# Patient Record
Sex: Female | Born: 1935 | Race: Black or African American | Hispanic: No | Smoking: Never smoker
Health system: Southern US, Community
[De-identification: ages and names within clinical notes are randomized; demographics above are authoritative.]

## PROBLEM LIST (undated history)

## (undated) DIAGNOSIS — Z66 Do not resuscitate: Secondary | ICD-10-CM

## (undated) DIAGNOSIS — IMO0002 Reserved for concepts with insufficient information to code with codable children: Secondary | ICD-10-CM

## (undated) DIAGNOSIS — E039 Hypothyroidism, unspecified: Secondary | ICD-10-CM

## (undated) DIAGNOSIS — F0390 Unspecified dementia without behavioral disturbance: Secondary | ICD-10-CM

## (undated) DIAGNOSIS — D472 Monoclonal gammopathy: Secondary | ICD-10-CM

## (undated) DIAGNOSIS — K219 Gastro-esophageal reflux disease without esophagitis: Secondary | ICD-10-CM

## (undated) DIAGNOSIS — K5792 Diverticulitis of intestine, part unspecified, without perforation or abscess without bleeding: Secondary | ICD-10-CM

## (undated) DIAGNOSIS — A0472 Enterocolitis due to Clostridium difficile, not specified as recurrent: Secondary | ICD-10-CM

## (undated) DIAGNOSIS — G9340 Encephalopathy, unspecified: Secondary | ICD-10-CM

## (undated) DIAGNOSIS — K8021 Calculus of gallbladder without cholecystitis with obstruction: Secondary | ICD-10-CM

## (undated) DIAGNOSIS — A021 Salmonella sepsis: Secondary | ICD-10-CM

## (undated) DIAGNOSIS — I10 Essential (primary) hypertension: Secondary | ICD-10-CM

## (undated) DIAGNOSIS — M4800 Spinal stenosis, site unspecified: Secondary | ICD-10-CM

## (undated) DIAGNOSIS — K279 Peptic ulcer, site unspecified, unspecified as acute or chronic, without hemorrhage or perforation: Secondary | ICD-10-CM

## (undated) DIAGNOSIS — K8309 Other cholangitis: Secondary | ICD-10-CM

## (undated) DIAGNOSIS — E46 Unspecified protein-calorie malnutrition: Secondary | ICD-10-CM

## (undated) DIAGNOSIS — J449 Chronic obstructive pulmonary disease, unspecified: Secondary | ICD-10-CM

## (undated) DIAGNOSIS — K5730 Diverticulosis of large intestine without perforation or abscess without bleeding: Secondary | ICD-10-CM

## (undated) DIAGNOSIS — K2970 Gastritis, unspecified, without bleeding: Secondary | ICD-10-CM

## (undated) DIAGNOSIS — R339 Retention of urine, unspecified: Secondary | ICD-10-CM

## (undated) DIAGNOSIS — K859 Acute pancreatitis without necrosis or infection, unspecified: Secondary | ICD-10-CM

## (undated) DIAGNOSIS — I639 Cerebral infarction, unspecified: Secondary | ICD-10-CM

## (undated) DIAGNOSIS — Z9889 Other specified postprocedural states: Secondary | ICD-10-CM

## (undated) DIAGNOSIS — M1990 Unspecified osteoarthritis, unspecified site: Secondary | ICD-10-CM

## (undated) DIAGNOSIS — K449 Diaphragmatic hernia without obstruction or gangrene: Secondary | ICD-10-CM

## (undated) DIAGNOSIS — K2210 Ulcer of esophagus without bleeding: Secondary | ICD-10-CM

## (undated) DIAGNOSIS — R569 Unspecified convulsions: Secondary | ICD-10-CM

## (undated) DIAGNOSIS — E119 Type 2 diabetes mellitus without complications: Secondary | ICD-10-CM

## (undated) DIAGNOSIS — N289 Disorder of kidney and ureter, unspecified: Secondary | ICD-10-CM

## (undated) HISTORY — PX: CERVICAL FUSION: SHX112

## (undated) HISTORY — DX: Essential (primary) hypertension: I10

## (undated) HISTORY — PX: CHOLECYSTECTOMY: SHX55

## (undated) HISTORY — DX: Chronic obstructive pulmonary disease, unspecified: J44.9

## (undated) HISTORY — DX: Retention of urine, unspecified: R33.9

## (undated) HISTORY — DX: Diaphragmatic hernia without obstruction or gangrene: K44.9

## (undated) HISTORY — DX: Acute pancreatitis without necrosis or infection, unspecified: K85.90

## (undated) HISTORY — DX: Other specified postprocedural states: Z98.890

## (undated) HISTORY — DX: Spinal stenosis, site unspecified: M48.00

## (undated) HISTORY — DX: Gastritis, unspecified, without bleeding: K29.70

## (undated) HISTORY — DX: Diverticulosis of large intestine without perforation or abscess without bleeding: K57.30

## (undated) HISTORY — DX: Diverticulitis of intestine, part unspecified, without perforation or abscess without bleeding: K57.92

## (undated) HISTORY — DX: Ulcer of esophagus without bleeding: K22.10

## (undated) HISTORY — DX: Gastro-esophageal reflux disease without esophagitis: K21.9

## (undated) HISTORY — DX: Reserved for concepts with insufficient information to code with codable children: IMO0002

## (undated) HISTORY — DX: Salmonella sepsis: A02.1

## (undated) HISTORY — DX: Type 2 diabetes mellitus without complications: E11.9

## (undated) HISTORY — DX: Unspecified osteoarthritis, unspecified site: M19.90

---

## 2001-03-24 ENCOUNTER — Encounter: Payer: Self-pay | Admitting: Family Medicine

## 2001-03-24 ENCOUNTER — Ambulatory Visit (HOSPITAL_COMMUNITY): Admission: RE | Admit: 2001-03-24 | Discharge: 2001-03-24 | Payer: Self-pay | Admitting: Family Medicine

## 2001-10-17 ENCOUNTER — Encounter: Payer: Self-pay | Admitting: Internal Medicine

## 2001-10-17 ENCOUNTER — Emergency Department (HOSPITAL_COMMUNITY): Admission: EM | Admit: 2001-10-17 | Discharge: 2001-10-17 | Payer: Self-pay | Admitting: Internal Medicine

## 2001-10-19 ENCOUNTER — Encounter: Payer: Self-pay | Admitting: Internal Medicine

## 2001-10-19 ENCOUNTER — Ambulatory Visit (HOSPITAL_COMMUNITY): Admission: RE | Admit: 2001-10-19 | Discharge: 2001-10-19 | Payer: Self-pay | Admitting: Internal Medicine

## 2001-11-16 ENCOUNTER — Encounter: Payer: Self-pay | Admitting: *Deleted

## 2001-11-16 ENCOUNTER — Emergency Department (HOSPITAL_COMMUNITY): Admission: EM | Admit: 2001-11-16 | Discharge: 2001-11-16 | Payer: Self-pay | Admitting: *Deleted

## 2001-11-22 ENCOUNTER — Inpatient Hospital Stay (HOSPITAL_COMMUNITY): Admission: AD | Admit: 2001-11-22 | Discharge: 2001-11-26 | Payer: Self-pay | Admitting: Family Medicine

## 2001-11-22 ENCOUNTER — Encounter: Payer: Self-pay | Admitting: Family Medicine

## 2001-11-24 ENCOUNTER — Encounter (INDEPENDENT_AMBULATORY_CARE_PROVIDER_SITE_OTHER): Payer: Self-pay | Admitting: Internal Medicine

## 2001-11-27 ENCOUNTER — Emergency Department (HOSPITAL_COMMUNITY): Admission: EM | Admit: 2001-11-27 | Discharge: 2001-11-28 | Payer: Self-pay | Admitting: Internal Medicine

## 2001-11-27 ENCOUNTER — Encounter: Payer: Self-pay | Admitting: Internal Medicine

## 2001-11-29 ENCOUNTER — Inpatient Hospital Stay (HOSPITAL_COMMUNITY): Admission: EM | Admit: 2001-11-29 | Discharge: 2001-12-02 | Payer: Self-pay | Admitting: Emergency Medicine

## 2001-11-29 ENCOUNTER — Encounter: Payer: Self-pay | Admitting: Family Medicine

## 2001-12-29 ENCOUNTER — Encounter: Admission: RE | Admit: 2001-12-29 | Discharge: 2001-12-29 | Payer: Self-pay | Admitting: Neurosurgery

## 2001-12-29 ENCOUNTER — Encounter: Payer: Self-pay | Admitting: Neurosurgery

## 2002-10-08 ENCOUNTER — Encounter: Payer: Self-pay | Admitting: *Deleted

## 2002-10-08 ENCOUNTER — Emergency Department (HOSPITAL_COMMUNITY): Admission: EM | Admit: 2002-10-08 | Discharge: 2002-10-08 | Payer: Self-pay | Admitting: *Deleted

## 2002-10-13 ENCOUNTER — Encounter: Payer: Self-pay | Admitting: Neurology

## 2002-10-13 ENCOUNTER — Inpatient Hospital Stay (HOSPITAL_COMMUNITY): Admission: AD | Admit: 2002-10-13 | Discharge: 2002-10-24 | Payer: Self-pay | Admitting: Neurology

## 2002-10-17 ENCOUNTER — Encounter: Payer: Self-pay | Admitting: Neurosurgery

## 2002-10-18 ENCOUNTER — Encounter: Payer: Self-pay | Admitting: Neurology

## 2003-08-30 ENCOUNTER — Ambulatory Visit (HOSPITAL_COMMUNITY): Admission: RE | Admit: 2003-08-30 | Discharge: 2003-08-30 | Payer: Self-pay | Admitting: Family Medicine

## 2003-11-16 ENCOUNTER — Inpatient Hospital Stay (HOSPITAL_COMMUNITY): Admission: EM | Admit: 2003-11-16 | Discharge: 2003-11-21 | Payer: Self-pay | Admitting: Emergency Medicine

## 2004-05-07 ENCOUNTER — Ambulatory Visit (HOSPITAL_COMMUNITY): Admission: RE | Admit: 2004-05-07 | Discharge: 2004-05-07 | Payer: Self-pay | Admitting: Internal Medicine

## 2004-05-21 ENCOUNTER — Inpatient Hospital Stay (HOSPITAL_COMMUNITY): Admission: EM | Admit: 2004-05-21 | Discharge: 2004-05-30 | Payer: Self-pay | Admitting: *Deleted

## 2004-05-21 ENCOUNTER — Ambulatory Visit: Payer: Self-pay | Admitting: Internal Medicine

## 2004-05-30 ENCOUNTER — Inpatient Hospital Stay: Admission: AD | Admit: 2004-05-30 | Discharge: 2004-06-19 | Payer: Self-pay | Admitting: Pulmonary Disease

## 2005-01-09 ENCOUNTER — Observation Stay (HOSPITAL_COMMUNITY): Admission: EM | Admit: 2005-01-09 | Discharge: 2005-01-13 | Payer: Self-pay | Admitting: Emergency Medicine

## 2005-02-12 ENCOUNTER — Ambulatory Visit: Payer: Self-pay | Admitting: Internal Medicine

## 2005-02-12 ENCOUNTER — Ambulatory Visit (HOSPITAL_COMMUNITY): Admission: RE | Admit: 2005-02-12 | Discharge: 2005-02-12 | Payer: Self-pay | Admitting: Internal Medicine

## 2005-05-17 ENCOUNTER — Emergency Department (HOSPITAL_COMMUNITY): Admission: EM | Admit: 2005-05-17 | Discharge: 2005-05-17 | Payer: Self-pay | Admitting: Emergency Medicine

## 2005-09-11 ENCOUNTER — Ambulatory Visit: Payer: Self-pay | Admitting: Orthopedic Surgery

## 2005-10-16 ENCOUNTER — Ambulatory Visit: Payer: Self-pay | Admitting: Orthopedic Surgery

## 2005-10-31 ENCOUNTER — Ambulatory Visit (HOSPITAL_COMMUNITY): Admission: RE | Admit: 2005-10-31 | Discharge: 2005-10-31 | Payer: Self-pay | Admitting: Family Medicine

## 2006-03-16 ENCOUNTER — Ambulatory Visit (HOSPITAL_COMMUNITY): Admission: RE | Admit: 2006-03-16 | Discharge: 2006-03-16 | Payer: Self-pay | Admitting: Family Medicine

## 2006-07-22 ENCOUNTER — Ambulatory Visit (HOSPITAL_COMMUNITY): Admission: RE | Admit: 2006-07-22 | Discharge: 2006-07-22 | Payer: Self-pay | Admitting: Family Medicine

## 2006-08-07 ENCOUNTER — Emergency Department (HOSPITAL_COMMUNITY): Admission: EM | Admit: 2006-08-07 | Discharge: 2006-08-07 | Payer: Self-pay | Admitting: Emergency Medicine

## 2006-10-07 ENCOUNTER — Ambulatory Visit (HOSPITAL_COMMUNITY): Admission: RE | Admit: 2006-10-07 | Discharge: 2006-10-07 | Payer: Self-pay | Admitting: Family Medicine

## 2006-10-28 ENCOUNTER — Ambulatory Visit: Payer: Self-pay | Admitting: Orthopedic Surgery

## 2007-01-21 ENCOUNTER — Ambulatory Visit (HOSPITAL_COMMUNITY): Admission: RE | Admit: 2007-01-21 | Discharge: 2007-01-21 | Payer: Self-pay | Admitting: Family Medicine

## 2007-04-16 ENCOUNTER — Ambulatory Visit (HOSPITAL_COMMUNITY): Admission: RE | Admit: 2007-04-16 | Discharge: 2007-04-16 | Payer: Self-pay | Admitting: Family Medicine

## 2007-05-20 DIAGNOSIS — Z9889 Other specified postprocedural states: Secondary | ICD-10-CM

## 2007-05-20 HISTORY — DX: Other specified postprocedural states: Z98.890

## 2007-05-31 ENCOUNTER — Ambulatory Visit (HOSPITAL_COMMUNITY): Admission: RE | Admit: 2007-05-31 | Discharge: 2007-05-31 | Payer: Self-pay | Admitting: Family Medicine

## 2007-06-27 ENCOUNTER — Emergency Department (HOSPITAL_COMMUNITY): Admission: EM | Admit: 2007-06-27 | Discharge: 2007-06-27 | Payer: Self-pay | Admitting: Emergency Medicine

## 2007-09-06 ENCOUNTER — Ambulatory Visit (HOSPITAL_COMMUNITY): Admission: RE | Admit: 2007-09-06 | Discharge: 2007-09-06 | Payer: Self-pay | Admitting: Family Medicine

## 2007-09-10 ENCOUNTER — Ambulatory Visit (HOSPITAL_COMMUNITY): Admission: RE | Admit: 2007-09-10 | Discharge: 2007-09-10 | Payer: Self-pay | Admitting: Family Medicine

## 2007-11-16 ENCOUNTER — Observation Stay (HOSPITAL_COMMUNITY): Admission: EM | Admit: 2007-11-16 | Discharge: 2007-11-19 | Payer: Self-pay | Admitting: Emergency Medicine

## 2007-11-17 ENCOUNTER — Ambulatory Visit: Payer: Self-pay | Admitting: Gastroenterology

## 2007-12-07 ENCOUNTER — Ambulatory Visit: Payer: Self-pay | Admitting: Gastroenterology

## 2007-12-14 ENCOUNTER — Ambulatory Visit (HOSPITAL_COMMUNITY): Admission: RE | Admit: 2007-12-14 | Discharge: 2007-12-14 | Payer: Self-pay | Admitting: Gastroenterology

## 2008-01-03 ENCOUNTER — Ambulatory Visit (HOSPITAL_COMMUNITY): Admission: RE | Admit: 2008-01-03 | Discharge: 2008-01-03 | Payer: Self-pay | Admitting: Gastroenterology

## 2008-01-03 ENCOUNTER — Ambulatory Visit: Payer: Self-pay | Admitting: Gastroenterology

## 2008-01-03 ENCOUNTER — Encounter: Payer: Self-pay | Admitting: Gastroenterology

## 2008-01-22 ENCOUNTER — Inpatient Hospital Stay (HOSPITAL_COMMUNITY): Admission: EM | Admit: 2008-01-22 | Discharge: 2008-01-27 | Payer: Self-pay | Admitting: Emergency Medicine

## 2008-01-24 ENCOUNTER — Ambulatory Visit: Payer: Self-pay | Admitting: Internal Medicine

## 2008-01-26 ENCOUNTER — Ambulatory Visit: Payer: Self-pay | Admitting: Gastroenterology

## 2008-03-09 ENCOUNTER — Ambulatory Visit: Payer: Self-pay | Admitting: Gastroenterology

## 2008-03-22 ENCOUNTER — Ambulatory Visit (HOSPITAL_COMMUNITY): Admission: RE | Admit: 2008-03-22 | Discharge: 2008-03-22 | Payer: Self-pay | Admitting: Gastroenterology

## 2008-03-22 ENCOUNTER — Ambulatory Visit: Payer: Self-pay | Admitting: Gastroenterology

## 2008-11-14 ENCOUNTER — Encounter: Payer: Self-pay | Admitting: Gastroenterology

## 2009-11-21 ENCOUNTER — Encounter: Payer: Self-pay | Admitting: Urgent Care

## 2009-11-27 ENCOUNTER — Encounter: Payer: Self-pay | Admitting: Urgent Care

## 2009-12-04 ENCOUNTER — Encounter (INDEPENDENT_AMBULATORY_CARE_PROVIDER_SITE_OTHER): Payer: Self-pay | Admitting: *Deleted

## 2009-12-28 DIAGNOSIS — E119 Type 2 diabetes mellitus without complications: Secondary | ICD-10-CM

## 2009-12-28 DIAGNOSIS — M1990 Unspecified osteoarthritis, unspecified site: Secondary | ICD-10-CM | POA: Insufficient documentation

## 2009-12-28 DIAGNOSIS — I10 Essential (primary) hypertension: Secondary | ICD-10-CM

## 2009-12-28 DIAGNOSIS — J449 Chronic obstructive pulmonary disease, unspecified: Secondary | ICD-10-CM

## 2009-12-28 DIAGNOSIS — J45909 Unspecified asthma, uncomplicated: Secondary | ICD-10-CM | POA: Insufficient documentation

## 2009-12-28 DIAGNOSIS — K219 Gastro-esophageal reflux disease without esophagitis: Secondary | ICD-10-CM

## 2009-12-28 DIAGNOSIS — K5730 Diverticulosis of large intestine without perforation or abscess without bleeding: Secondary | ICD-10-CM | POA: Insufficient documentation

## 2009-12-28 DIAGNOSIS — Z8719 Personal history of other diseases of the digestive system: Secondary | ICD-10-CM | POA: Insufficient documentation

## 2010-03-26 ENCOUNTER — Encounter: Payer: Self-pay | Admitting: Urgent Care

## 2010-04-30 ENCOUNTER — Ambulatory Visit: Payer: Self-pay | Admitting: Internal Medicine

## 2010-06-09 ENCOUNTER — Encounter: Payer: Self-pay | Admitting: Neurosurgery

## 2010-06-18 NOTE — Medication Information (Signed)
Summary: OMEPRAZOLE 20MG   OMEPRAZOLE 20MG    Imported By: Rexene Alberts 03/26/2010 14:39:18  _____________________________________________________________________  External Attachment:    Type:   Image     Comment:   External Document  Appended Document: OMEPRAZOLE 20MG  Denied.  Please sched OV or get from PCP.  Appended Document: OMEPRAZOLE 20MG  pharmacy aware

## 2010-06-18 NOTE — Letter (Signed)
Summary: Scheduled Appointment  [facility]  [address]   [place], [place] [address]   Phone: [phone]  Fax: [phone]    December 04, 2009   Dear: Lavone Neri            DOB: Nov 11, 1912    I have been instructed to schedule you an appointment in our office.  Your appointment is as follows:   Date:            December 31, 2009   Time:            11AM     Please be here 15 minutes early.   Provider:       DR FIELDS    Please contact the office if you need to reschedule this appointment for a more convenient time.   Thank you,    Diana Eves       [facility] Ph: [phone]   Fax: [phone]

## 2010-06-18 NOTE — Medication Information (Signed)
Summary: Tax adviser   Imported By: Peggyann Shoals 11/27/2009 16:36:04  _____________________________________________________________________  External Attachment:    Type:   Image     Comment:   External Document  Appended Document: RX Folder:OMEPRAZOLE    Prescriptions: OMEPRAZOLE 20 MG CPDR (OMEPRAZOLE) one by mouth 30 mins before breakfast daily  #30 x 1   Entered and Authorized by:   Joselyn Arrow FNP-BC   Signed by:   Joselyn Arrow FNP-BC on 11/28/2009   Method used:   Electronically to        [facility] (retail)       [address]       [place], [place]  [address]       Ph: [phone]       Fax: [phone]   RxID:   [id number]  Needs OV prior to any further RFs, not seen in 2 yrs   Appended Document: RX Folder mailed letter to pt for appt of 8/15 @ 11am w/SF

## 2010-06-18 NOTE — Medication Information (Signed)
Summary: Tax adviser   Imported By: Rosine Beat 11/21/2009 09:09:05  _____________________________________________________________________  External Attachment:    Type:   Image     Comment:   External Document  Appended Document: RX Folder Denied.  Pt not seen in over 2 yrs.  Can get from PCP or make appt  Appended Document: RX Folder see duplicate request, will give enough to get to OV

## 2010-07-27 ENCOUNTER — Emergency Department (HOSPITAL_COMMUNITY): Payer: PRIVATE HEALTH INSURANCE

## 2010-07-27 ENCOUNTER — Inpatient Hospital Stay (HOSPITAL_COMMUNITY)
Admission: EM | Admit: 2010-07-27 | Discharge: 2010-07-31 | DRG: 377 | Disposition: A | Discharge status: Home / Self Care | Payer: PRIVATE HEALTH INSURANCE | Attending: Internal Medicine | Admitting: Internal Medicine

## 2010-07-27 DIAGNOSIS — R933 Abnormal findings on diagnostic imaging of other parts of digestive tract: Secondary | ICD-10-CM

## 2010-07-27 DIAGNOSIS — D62 Acute posthemorrhagic anemia: Secondary | ICD-10-CM | POA: Diagnosis present

## 2010-07-27 DIAGNOSIS — J4489 Other specified chronic obstructive pulmonary disease: Secondary | ICD-10-CM | POA: Diagnosis present

## 2010-07-27 DIAGNOSIS — K319 Disease of stomach and duodenum, unspecified: Secondary | ICD-10-CM

## 2010-07-27 DIAGNOSIS — N289 Disorder of kidney and ureter, unspecified: Secondary | ICD-10-CM | POA: Diagnosis present

## 2010-07-27 DIAGNOSIS — T3995XA Adverse effect of unspecified nonopioid analgesic, antipyretic and antirheumatic, initial encounter: Secondary | ICD-10-CM | POA: Diagnosis present

## 2010-07-27 DIAGNOSIS — D500 Iron deficiency anemia secondary to blood loss (chronic): Secondary | ICD-10-CM | POA: Diagnosis present

## 2010-07-27 DIAGNOSIS — I10 Essential (primary) hypertension: Secondary | ICD-10-CM | POA: Diagnosis present

## 2010-07-27 DIAGNOSIS — K250 Acute gastric ulcer with hemorrhage: Principal | ICD-10-CM | POA: Diagnosis present

## 2010-07-27 DIAGNOSIS — J449 Chronic obstructive pulmonary disease, unspecified: Secondary | ICD-10-CM | POA: Diagnosis present

## 2010-07-27 DIAGNOSIS — M1990 Unspecified osteoarthritis, unspecified site: Secondary | ICD-10-CM | POA: Diagnosis present

## 2010-07-27 DIAGNOSIS — R627 Adult failure to thrive: Secondary | ICD-10-CM | POA: Diagnosis present

## 2010-07-27 DIAGNOSIS — Q391 Atresia of esophagus with tracheo-esophageal fistula: Secondary | ICD-10-CM

## 2010-07-27 DIAGNOSIS — E860 Dehydration: Secondary | ICD-10-CM | POA: Diagnosis present

## 2010-07-27 LAB — COMPREHENSIVE METABOLIC PANEL
AST: 13 U/L (ref 0–37)
BUN: 53 mg/dL — ABNORMAL HIGH (ref 6–23)
CO2: 27 mEq/L (ref 19–32)
Chloride: 103 mEq/L (ref 96–112)
Creatinine, Ser: 1.43 mg/dL — ABNORMAL HIGH (ref 0.4–1.2)
GFR calc Af Amer: 41 mL/min — ABNORMAL LOW (ref >60)
GFR calc non Af Amer: 34 mL/min — ABNORMAL LOW (ref >60)
Total Bilirubin: 0.5 mg/dL (ref 0.3–1.2)

## 2010-07-27 LAB — URINALYSIS, ROUTINE W REFLEX MICROSCOPIC
Glucose, UA: NEGATIVE mg/dL
Nitrite: NEGATIVE
Protein, ur: NEGATIVE mg/dL
Urobilinogen, UA: 0.2 mg/dL (ref 0.0–1.0)

## 2010-07-27 LAB — DIFFERENTIAL
Basophils Absolute: 0 10*3/uL (ref 0.0–0.1)
Basophils Relative: 0 % (ref 0–1)
Eosinophils Absolute: 0.2 10*3/uL (ref 0.0–0.7)
Monocytes Absolute: 1 10*3/uL (ref 0.1–1.0)
Neutro Abs: 7.3 10*3/uL (ref 1.7–7.7)

## 2010-07-27 LAB — CBC
Hemoglobin: 10 g/dL — ABNORMAL LOW (ref 12.0–15.0)
MCHC: 33.3 g/dL (ref 30.0–36.0)
Platelets: 308 10*3/uL (ref 150–400)
RDW: 13.7 % (ref 11.5–15.5)

## 2010-07-27 MED ORDER — IOHEXOL 300 MG/ML  SOLN
80.0000 mL | Freq: Once | INTRAMUSCULAR | Status: AC | PRN
  Administered 2010-07-27: 80 mL via INTRAVENOUS

## 2010-07-28 LAB — TSH: TSH: 1.666 u[IU]/mL (ref 0.350–4.500)

## 2010-07-29 ENCOUNTER — Other Ambulatory Visit: Payer: Self-pay | Admitting: Internal Medicine

## 2010-07-29 DIAGNOSIS — K222 Esophageal obstruction: Secondary | ICD-10-CM

## 2010-07-29 DIAGNOSIS — T3995XA Adverse effect of unspecified nonopioid analgesic, antipyretic and antirheumatic, initial encounter: Secondary | ICD-10-CM

## 2010-07-29 DIAGNOSIS — K253 Acute gastric ulcer without hemorrhage or perforation: Secondary | ICD-10-CM

## 2010-07-29 LAB — BASIC METABOLIC PANEL
BUN: 26 mg/dL — ABNORMAL HIGH (ref 6–23)
CO2: 24 mEq/L (ref 19–32)
Calcium: 8.7 mg/dL (ref 8.4–10.5)
Chloride: 110 mEq/L (ref 96–112)
Creatinine, Ser: 1.12 mg/dL (ref 0.4–1.2)
GFR calc non Af Amer: 45 mL/min — ABNORMAL LOW (ref >60)
Glucose, Bld: 106 mg/dL — ABNORMAL HIGH (ref 70–99)
Sodium: 139 mEq/L (ref 135–145)

## 2010-07-29 NOTE — H&P (Signed)
NAME:  [name], [name]           ACCOUNT NO.:  [id number]  MEDICAL RECORD NO.:  [id number]           PATIENT TYPE:  LOCATION:                                 FACILITY:  PHYSICIAN:  R. Roetta Sessions, M.D. DATE OF BIRTH:  [birth year]  DATE OF ADMISSION:  07/28/2010 DATE OF DISCHARGE:  LH                             HISTORY & PHYSICAL   REASON FOR CONSULTATION:  Abdominal pain, weight loss, markedly abnormal stomach on CT.  HISTORY OF PRESENT ILLNESS:  [name] is a very pleasant 77 year old lady who was seen in emergency room yesterday for progressive abdominal pain and weight loss.  She has had some nausea but not much vomiting.  No hematemesis, melena, or hematochezia.  She was evaluated in the emergency department by the ED physician evaluation including abdominal pelvic CT scan which revealed marked gastric wall thickening with posterior greater curvature ulcerations surrounding the stranding but no perforation or abscess noted.  The patient also had significant diverticulosis.  No obvious metastatic disease was observed.  Laboratory evaluation revealed anemia with H&H 10.0 and 30.0, MCV 96.5. White count 10,000.  Serum lipase normal at 18, BUN 53, creatinine 1.43, total bilirubin 0.5, alkaline phos 47, AST and ALT 13 and 9 respectively.  Albumin 2.8.  This lady was evaluated for dysphagia and thickening of her stomach on CT previously.  She underwent EGD by Dr. Darrick Penna back in 2009.  She had gastritis.  Biopsies negative for malignancy and also negative for H. pylori.  She had a peptic stricture which was dilated.  She also had a colonoscopy which revealed extensive diverticulosis. Back at that time she was admitted with diverticulitis and what appeared to be focal pancreatitis but retrospect most likely represented bystander changes of the pancreas related to the primary problem of diverticulitis.  This lady's home medications are unknown.  In the medical record  on multiple occasions previously has been taking meloxicam.  Her prescribed over-the-counter medications are unknown at this time.  She is not having any dysphagia or odynophagia.  PAST MEDICAL HISTORY:  Significant for diverticulitis, hypertension, osteoarthritis, asthma.  Reports she has history of diabetes.  MEDICATIONS:  Unknown.  PAST SURGERIES:  None.  Endoscopic evaluation as outlined above.  FAMILY HISTORY:  Not known.  SOCIAL HISTORY:  The patient lives with one of her daughters.  No tobacco or alcohol.  REVIEW OF SYSTEMS:  As outlined above.  PHYSICAL EXAMINATION:  GENERAL:  A frail conversed oriented 89-year lady resting comfortably. VITAL SIGNS:  Temperature 98.2, pulse 77, respiratory rate 18, BP 100/58. SKIN:  Warm and dry. EYES:  Has scleral icterus.  Conjunctivae are pink. CHEST:  Lungs are clear to auscultation. HEART:  Regular rate and rhythm without murmur, gallop, or rub. ABDOMEN:  Full.  She has good bowel sounds, no bruits.  She has indistinct fullness just to the right of the midline of the epigastric level.  She is tender to deep palpation in this area.  LABORATORY DATA:  See a history of present illness.  I personally reviewed the CT of the abdomen and pelvis this admission.  IMPRESSION:  [name] is a very  pleasant 77 year old lady admitted to the hospital for failure to thrive with weight loss and significant progressive epigastric pain with at least a couple of weeks of duration. Her CT is markedly abnormal.  She appears to have marked abnormality of her stomach as outlined above.  There is a track record of taking nonsteroidals for a long period of time.  In this clinical setting, the differential includes gastric neoplasm (either adenocarcinoma or lymphoma) versus NSAID gastropathy.  Somewhat unusual for benign NSAID ulceration to cause this much pain in an elderly lady and the degree of abnormalities seen on the CT.  I suspect we are  dealing with a gastric neoplasm of some sort  RECOMMENDATIONS: 1. Agree with proton pump inhibitor therapy empirically. 2. Diagnostic EGD July 29, 2010.  I explained the risks, benefits, limitations, alternatives with this nice lady.  I believe she understands.  She is agreeable to make further recommendations once diagnostic EGD has been performed.  I thank the hospital service for allowing me to see her today.     Jonathon Bellows, M.D.     RMR/MEDQ  D:  07/28/2010  T:  07/28/2010  Job:  [id number]  cc:   Dr. Phillips Odor  Electronically Signed by Lorrin Goodell M.D. on 07/29/2010 10:31:00 AM

## 2010-07-30 ENCOUNTER — Encounter (INDEPENDENT_AMBULATORY_CARE_PROVIDER_SITE_OTHER): Payer: Self-pay | Admitting: *Deleted

## 2010-07-30 DIAGNOSIS — K279 Peptic ulcer, site unspecified, unspecified as acute or chronic, without hemorrhage or perforation: Secondary | ICD-10-CM

## 2010-07-31 DIAGNOSIS — R1013 Epigastric pain: Secondary | ICD-10-CM

## 2010-07-31 LAB — DIFFERENTIAL
Basophils Relative: 0 % (ref 0–1)
Eosinophils Absolute: 0.2 10*3/uL (ref 0.0–0.7)
Eosinophils Relative: 4 % (ref 0–5)
Neutrophils Relative %: 59 % (ref 43–77)

## 2010-07-31 LAB — CBC
MCH: 31.4 pg (ref 26.0–34.0)
Platelets: 282 10*3/uL (ref 150–400)
RBC: 3.15 MIL/uL — ABNORMAL LOW (ref 3.87–5.11)
RDW: 13.7 % (ref 11.5–15.5)
WBC: 6.5 10*3/uL (ref 4.0–10.5)

## 2010-07-31 LAB — BASIC METABOLIC PANEL
BUN: 11 mg/dL (ref 6–23)
Chloride: 111 mEq/L (ref 96–112)
Creatinine, Ser: 1.06 mg/dL (ref 0.4–1.2)
GFR calc Af Amer: 58 mL/min — ABNORMAL LOW (ref >60)
GFR calc non Af Amer: 48 mL/min — ABNORMAL LOW (ref >60)
Potassium: 4.8 mEq/L (ref 3.5–5.1)

## 2010-08-04 NOTE — Op Note (Signed)
  NAME:  [name], [name]           ACCOUNT NO.:  [id number]  MEDICAL RECORD NO.:  [id number]           PATIENT TYPE:  I  LOCATION:  A326                          FACILITY:  [facility]  PHYSICIAN:  R. Roetta Sessions, M.D. DATE OF BIRTH:  1913/05/01  DATE OF PROCEDURE:  07/29/2010 DATE OF DISCHARGE:                              OPERATIVE REPORT   INDICATIONS FOR PROCEDURE:  A 77 year old lady admitted to hospital with abdominal pain, anorexia, failure to thrive.  CT demonstrates large gastric ulceration of the antrum, lesser curvature.  EGD is now being done.  Risks, benefits, limitations, alternatives, imponderables have been discussed.  Please see documentation medical record.  PROCEDURE NOTE:  O2 saturation, blood pressure, pulse respirations were monitored entirety of the procedure.  Conscious sedation Versed and Demerol incremental doses.  Instrument Pentax video chip system.  FINDINGS:  Examination tubular esophagus revealed noncritical Schatzki's ring otherwise esophageal mucosa appeared normal.  EG junction easily traversed.  STOMACH:  Gastric cavity was emptied and insufflated well with air. Thorough examination of the gastric mucosa including retroflexed proximal stomach esophagogastric junction was undertaken.  There was minimal diffuse mottling of the gastric mucosa.  There was marked deformity of the antral prepyloric mucosa with a large 2.5 x 3 cm deep ulcer crater in the antrum superiorly and located to the pyloric channel on the distal lesser curvature.  Please see photos.  Margins were somewhat heaped up, but this overall appeared to be a benign lesion, surrounding edema encroached somewhat on the pyloric channel but it remains patent.  Position was traversed with scope.  Examination of the bulb and second portion revealed no abnormalities.  Attention turned back to the gastric ulcer utilizing the jumbo biopsy forceps and multiple biopsies of the peripheral ulcer  craters surrounding abnormal mucosa were taken for histologic study.  The patient tolerated the procedure well, was reacted.  ENDOSCOPY IMPRESSION: 1. Noncritical Schatzki ring not manipulated. 2. Diffuse mottling of the gastric mucosa deformity of antral     prepyloric mucosa with deep ulcer crater in the antrum on the     distal lesser curvature as described above, status post biopsy,     some mild secondary encroachment of the pyloric channel.  Pyloric     channel remains patent, normal D1, D2.  This may well be an NSAID induced ulcer.  Apparently, she has been taking meloxicam.  RECOMMENDATIONS:  No more nonsteroidals including meloxicam b.i.d., proton pump inhibitor therapy.  Full liquid diet.  Follow up on path.     Jonathon Bellows, M.D.     RMR/MEDQ  D:  07/29/2010  T:  07/29/2010  Job:  [id number]  Electronically Signed by Lorrin Goodell M.D. on 08/04/2010 09:12:48 AM

## 2010-08-05 NOTE — Discharge Summary (Signed)
NAMEAZURE, BUDNICK           ACCOUNT NO.:  [id number]  MEDICAL RECORD NO.:  [id number]           PATIENT TYPE:  I  LOCATION:  A326                          FACILITY:  [facility]  PHYSICIAN:  Elliot Cousin, M.D.    DATE OF BIRTH:  1912/11/06  DATE OF ADMISSION:  07/27/2010 DATE OF DISCHARGE:  03/14/2012LH                              DISCHARGE SUMMARY   DISCHARGE DIAGNOSES: 1. Deep gastric ulcer per EGD on July 29, 2010.  The pathology report     revealed an acute ulcer in the background of chronic active     gastritis with focal intestinal metaplasia but no evidence of     Helicobacter pylori, dysplasia, or malignancy.  The gastric ulcer     was thought to be secondary to nonsteroidal antiinflammatory drug     use. 2. Abdominal pain and weight loss, secondary to peptic ulcer disease. 3. Acute on chronic blood loss anemia. 4. Acute renal insufficiency, resolved with IV fluids.  Diovan was     restarted upon discharge.  The patient's renal function will need     to be monitored closely.  On admission, her BUN was 53 and her     creatinine was 1.43.  At the time of discharge, her BUN was 11 and     her creatinine was 1.06. 5. Hypertension. 6. Asthma which remained stable. 7. Osteoarthritis. 8. Mild deconditioning. 9. Noncritical Schatzki ring per EGD, not manipulated.  DISCHARGE MEDICATIONS: 1. Protonix 40 mg b.i.d. (new medication). 2. Multivitamin with iron once daily. 3. MiraLax 17 g daily as needed for constipation. 4. Senokot-S 2 tablets at bedtime. 5. Advair Diskus 250/50 one puff b.i.d. 6. Diovan 80 mg daily (recheck the patient's renal function in 1-2     weeks). 7. Hydrocodone/APAP 2.5/500 mg every 6 hours as needed for pain. 8. Lidocaine patch 5%, leave on for 12 hours, then remove for 12     hours, topically daily. 9. Singulair 10 mg daily as needed for allergies. 10.Theophylline XR 200 mg b.i.d. 11.VESIcare 5 mg daily. 12.Zolpidem 10 mg at bedtime p.r.n. for  sleep. 13.Stop furosemide. 14.Stop potassium chloride. 15.Stop meloxicam.  DISCHARGE DISPOSITION:  The patient was discharged to home in improved and stable condition on July 31, 2010.  She will follow up with Dr. Jena Gauss in 2-3 weeks.  She will follow up with her primary care physician, Dr. Assunta Found, in 1 week.  CONSULTATIONS:  Jonathon Bellows, MD  PROCEDURE PERFORMED: 1. EGD on July 29, 2010, by Dr. Jena Gauss..  The results revealed     noncritical Schatzki ring, not manipulated.  Diffuse mottling of     the gastric mucosa deformity of antral prepyloric mucosa with deep     ulcer crater in the antrum on the distal lesser curvature as     described, status post biopsy, some mild secondary encroachment of     the pyloric channel.  The pyloric channel remained patent.  Normal     D1 and D2. 2. CT scan of the abdomen and pelvis on July 27, 2010.  The results     revealed marked gastric body wall thickening with  suggestion of     posterior greater curvature ulceration and surrounding stranding     but no free air to suggest perforation or fluid collection to     suggest abscess formation at this time.  HISTORY OF PRESENT ILLNESS:  The patient is a 77 year old woman with a past medical history significant for osteoarthritis, hypertension, and COPD/asthma.  She presented to the emergency department on July 27, 2010, with a chief complaint of abdominal pain and weight loss.  When she was initially evaluated, she was noted to be afebrile and hemodynamically stable.  An acute abdominal series was ordered and it revealed no evidence of acute abnormalities.  Shortly thereafter, a CT scan of her abdomen and pelvis was ordered.  The findings were suggestive of marked gastric body wall thickening.  Her lipase was 18.  Her BUN was 53 and her creatinine was 1.43.  Her liver transaminases were within normal limits.  She was admitted for further evaluation and management.  HOSPITAL COURSE:   The patient was started on IV fluid hydration for acute renal insufficiency thought to be secondary in part to dehydration or volume depletion.  Lasix was discontinued.  Diovan was discontinued. Proton pump inhibitor therapy was started empirically.  Her pain was treated with as-needed morphine.  Her nausea was treated with as-needed Zofran.  Given the findings on the CT scan, gastroenterologist, Dr. Jena Gauss, was consulted.  He evaluated the patient and proceeded with an upper endoscopy.  The results of the upper endoscopy were dictated above.  Dr. Jena Gauss believed that the patient's ulcer was NSAID induced. She and her family were instructed on avoidance of all NSAIDs. They both voiced understanding.  Following the EGD, the patient was transitioned to b.i.d. dosing of Protonix.  Her hemoglobin did decrease a little from 10 g to 9.9 g.  The patient probably has chronic anemia as a consequence of acute on chronic blood loss from the gastric ulcer. She was advised to take one multivitamin with iron daily.  Iron studies were not ordered.  The patient's renal function improved.  Her BUN improved to 11 and her creatinine improved to 1.06 prior to hospital discharge.  Diovan was restarted at 40 mg daily which is half of her usual home dose.  However, because her blood pressure began to increase, she was instructed to restart Diovan at home at 80 mg daily.  Her renal function will need to be followed closely.  Of note, Lasix was discontinued indefinitely unless Dr. Phillips Odor believes that she needs Lasix for generalized edema if needed.  The patient had no peripheral edema during the hospitalization.  She was also treated with laxative therapy due to her complaints of constipation.  She actually did have a bowel movement this morning which made her feel better.  The physical therapist was consulted for evaluation.  Per her assessment, the patient was at baseline.  The therapist did recommend  a bedside commode and physical therapy for home to improve her gait stability.  DISCHARGE LABORATORY VALUES:  Sodium 143, potassium 4.8, chloride 111, CO2 24, glucose 89, BUN 11, creatinine 1.06, calcium 9.1.  WBC 6.5, hemoglobin 9.9, platelet count 282,000.  Urinalysis essentially normal. TSH 1.66.     Elliot Cousin, M.D.     DF/MEDQ  D:  07/31/2010  T:  07/31/2010  Job:  [id number]  cc:   R. Roetta Sessions, M.D. [address] [place] [address]  Corrie Mckusick, M.D. Fax: [phone]  Electronically Signed by Fransisca Kaufmann.D.  on 08/05/2010 09:12:42 AM

## 2010-08-06 ENCOUNTER — Encounter: Payer: Self-pay | Admitting: Gastroenterology

## 2010-08-06 NOTE — Miscellaneous (Signed)
Summary: HISTORY & PHYSICAL  Clinical Lists Changes  NAME:  [name], [name]           ACCOUNT NO.:  [id number]      MEDICAL RECORD NO.:  [id number]           PATIENT TYPE:      LOCATION:                                 FACILITY:      PHYSICIAN:  R. Roetta Sessions, M.D. DATE OF BIRTH:  [birth year]      DATE OF ADMISSION:  07/28/2010   DATE OF DISCHARGE:  LH                                 HISTORY          REASON FOR CONSULTATION:  Abdominal pain, weight loss, markedly abnormal   stomach on CT.      HISTORY OF PRESENT ILLNESS:  [name] is a very pleasant 77-year-   old lady who was seen in emergency room yesterday for progressive   abdominal pain and weight loss.  She has had some nausea but not much   vomiting.  No hematemesis, melena, or hematochezia.      She was evaluated in the emergency department by the ED physician   evaluation including abdominal pelvic CT scan which revealed marked   gastric wall thickening with posterior greater curvature ulcerations   surrounding the stranding but no perforation or abscess noted.  The   patient also had significant diverticulosis.  No obvious metastatic   disease was observed.      Laboratory evaluation revealed anemia with H   White count 10,000.  Serum lipase normal at 18, BUN 53, creatinine 1.43,   total bilirubin 0.5, alkaline phos 47, AST and ALT 13 and 9   respectively.  Albumin 2.8.      This lady was evaluated for dysphagia and thickening of her stomach on   CT previously.  She underwent EGD by Dr. Darrick Penna back in 2009.  She had   gastritis.  Biopsies negative for malignancy and also negative for H.   pylori.  She had a peptic stricture which was dilated.      She also had a colonoscopy which revealed extensive diverticulosis.   Back at that time she was admitted with diverticulitis and what appeared   to be focal pancreatitis but retrospect most likely represented   bystander changes of the pancreas related to the  primary problem of   diverticulitis.      This lady's home medications are unknown.  In the medical record on   multiple occasions previously has been taking meloxicam.  Her prescribed   over-the-counter medications are unknown at this time.  She is not   having any dysphagia or odynophagia.      PAST MEDICAL HISTORY:  Significant for diverticulitis, hypertension,   osteoarthritis, asthma.  Reports she has history of diabetes.      MEDICATIONS:  Unknown.      PAST SURGERIES:  None.      Endoscopic evaluation as outlined above.      FAMILY HISTORY:  Not known.      SOCIAL HISTORY:  The patient lives with one of her daughters.  No   tobacco or alcohol.      REVIEW OF  SYSTEMS:  As outlined above.      PHYSICAL EXAMINATION:  GENERAL:  A frail conversed oriented 89-year lady   resting comfortably.   VITAL SIGNS:  Temperature 98.2, pulse 77, respiratory rate 18, BP   100/58.   SKIN:  Warm and dry.   EYES:  Has scleral icterus.  Conjunctivae are pink.   CHEST:  Lungs are clear to auscultation.   HEART:  Regular rate and rhythm without murmur, gallop, or rub.   ABDOMEN:  Full.  She has good bowel sounds, no bruits.  She has   indistinct fullness just to the right of the midline of the epigastric   level.  She is tender to deep palpation in this area.      LABORATORY DATA:  See a history of present illness.      I personally reviewed the CT of the abdomen and pelvis this admission.      IMPRESSION:  [name] is a very pleasant 77 year old lady admitted   to the hospital for failure to thrive with weight loss and significant   progressive epigastric pain with at least a couple of weeks of duration.   Her CT is markedly abnormal.  She appears to have marked abnormality of   her stomach as outlined above.  There is a track record of taking   nonsteroidals for a long period of time.  In this clinical setting, the   differential includes gastric neoplasm (either adenocarcinoma or    lymphoma) versus NSAID gastropathy.  Somewhat unusual for benign NSAID   ulceration to cause this much pain in an elderly lady and the degree of   abnormalities seen on the CT.  I suspect we are dealing with a gastric   neoplasm of some sort      RECOMMENDATIONS:   1. Agree with proton pump inhibitor therapy empirically.   2. Diagnostic EGD July 29, 2010.      I explained the risks, benefits, limitations, alternatives with this   nice lady.  I believe she understands.  She is agreeable to make further   recommendations once diagnostic EGD has been performed.      I thank the hospital service for allowing me to see her today.               Jonathon Bellows, M.D.               RMR/MEDQ  D:  07/28/2010  T:  07/28/2010  Job:  [id number]      cc:   Dr. Phillips Odor      Electronically Signed by Lorrin Goodell M.D. on 07/29/2010 10:31:00 AM

## 2010-08-10 NOTE — H&P (Signed)
NAMEANEDRA, [name] NO.:  [id number]  MEDICAL RECORD NO.:  [id number]           PATIENT TYPE:  E  LOCATION:  APED                          FACILITY:  [facility]  PHYSICIAN:  Houston Siren, MD           DATE OF BIRTH:  1912/07/18  DATE OF ADMISSION:  07/27/2010 DATE OF DISCHARGE:  LH                             HISTORY & PHYSICAL   PRIMARY CARE PHYSICIAN:  Dr. Phillips Odor.  ADVANCE DIRECTIVE:  Full code.  REASON FOR ADMISSION:  Abdominal pain and abnormal CT findings.  HISTORY OF PRESENT ILLNESS:  This is a 77 year old female with chronic history of abdominal pain and significant weight loss, brought to the emergency room because of epigastric discomfort.  She also has some nausea but no vomiting.  She stated she had been taking over-the-counter medication but the pain has not gotten better.  She does not know what medicine it was.  Because of the progressive discomfort after a couple of months when she was hoping it would go way, and since it did not, she presents to the emergency room.  Workup in the emergency room shows elevated BUN, creatinine of 53 and 1.43 with normal liver function tests, negative urinalysis.  She has a normal white count of 10,000 but her hemoglobin is slightly low at 10 with MCV of 96.  An abdominal pelvic CT showed marked gastric body wall thickening with posterior wall ulceration but no abscess.  It should be noted that review of her record in 2009, she had upper endoscopy with negative biopsy and colonoscopy which only showed diverticulosis.  It was done by Dr. Kassie Mends at that time.  Hospitalist was asked to admit the patient due to her advanced age and the fact that she has not been able to eat as well.  PAST MEDICAL HISTORY:  Abdominal pain, weight loss, hypertension, asthma, arthritis.  PAST SURGICAL HISTORY:  None.  SOCIAL HISTORY:  She lives with her daughter.  She does not smoke or drink alcohol.  ALLERGIES:  No known drug  allergies.  CHRONIC MEDICATIONS:  She is unable to tell but she takes some over-the- counter medication for her abdominal pain.  REVIEW OF SYSTEMS:  Otherwise unremarkable.  PHYSICAL EXAMINATION:  VITAL SIGNS:  Blood pressure 120/70, heart rate of 90, respiratory rate of 20, temperature 98.2. GENERAL:  Exam shows that she is alert and oriented and is in no apparent distress.  She is not short of breath. Her skin is warm and dry.  She is conversing.  She has facial symmetry with fluent speech. CARDIAC:  S1 and S2 regular. LUNGS:  Clear. ABDOMEN:  Slightly tender but not distended.  No palpable mass.  Bowel sounds present.  No rebound. EXTREMITIES:  No edema.  She has no leg tenderness and no calf pain. Good distal pulses bilaterally. NEUROLOGIC:  Unremarkable. PSYCHIATRIC:  Unremarkable.  OBJECTIVE FINDINGS:  EKG shows normal sinus rhythm without any acute ST- T changes.  CT of the abdomen and pelvis shows marked gastric body wall thickening with suggestion of posterior greater curvature ulceration and surrounding, stranding but no free air to suggest  perforation or any fluid collection to suggest abscess at this time.  Urinalysis is negative.  White count of 10,000, hemoglobin of 10.0, MCV of 96, potassium 4.9, glucose of 87, BUN 53, creatinine 1.43.  Liver function tests are normal.  Albumin is low at 2.8, lipase is 18, flat plate showed normal bowel gas pattern.  IMPRESSION:  This is a 77 year old female presented with abdominal pain as worsening over time along with weight loss, anemia and CT scan of the abdomen showed posterior wall stomach thickening.  I am most concerned about a gastric lymphoma or the gastric neoplasm.  We will admit and place her on n.p.o.  I will give her morphine for pain.  We will need to consult GI for repeat upper endoscopy.  She is currently stable.  I will give her intravenous Protonix.  I have discussed code status with her and her son and her  granddaughters and they confirmed that she would like to be full code.  We will honor this.  We will admit her to [facility].     Houston Siren, MD     PL/MEDQ  D:  07/27/2010  T:  07/27/2010  Job:  [id number]  Electronically Signed by Houston Siren  on 08/10/2010 05:17:20 AM

## 2010-08-14 ENCOUNTER — Encounter: Payer: Self-pay | Admitting: Gastroenterology

## 2010-08-14 ENCOUNTER — Ambulatory Visit (INDEPENDENT_AMBULATORY_CARE_PROVIDER_SITE_OTHER): Payer: PRIVATE HEALTH INSURANCE | Admitting: Gastroenterology

## 2010-08-14 VITALS — BP 140/70 | HR 68 | Temp 99.2°F | Ht 60.0 in | Wt 100.5 lb

## 2010-08-14 DIAGNOSIS — K279 Peptic ulcer, site unspecified, unspecified as acute or chronic, without hemorrhage or perforation: Secondary | ICD-10-CM

## 2010-08-14 NOTE — Patient Instructions (Signed)
Return in 3 months. We will set you up for an upper endoscopy to assess healing of the ulcer. Continue to take your reflux medicine twice a day.

## 2010-08-15 ENCOUNTER — Encounter: Payer: Self-pay | Admitting: Gastroenterology

## 2010-08-15 DIAGNOSIS — K279 Peptic ulcer, site unspecified, unspecified as acute or chronic, without hemorrhage or perforation: Secondary | ICD-10-CM | POA: Insufficient documentation

## 2010-08-15 LAB — CBC WITH DIFFERENTIAL/PLATELET
Basophils Absolute: 0 10*3/uL (ref 0.0–0.1)
Eosinophils Relative: 2 % (ref 0–5)
Lymphocytes Relative: 33 % (ref 12–46)
Lymphs Abs: 2.4 10*3/uL (ref 0.7–4.0)
MCV: 98.5 fL (ref 78.0–100.0)
Neutro Abs: 4.2 10*3/uL (ref 1.7–7.7)
Platelets: 241 10*3/uL (ref 150–400)
RBC: 3.36 MIL/uL — ABNORMAL LOW (ref 3.87–5.11)
RDW: 15.1 % (ref 11.5–15.5)
WBC: 7.4 10*3/uL (ref 4.0–10.5)

## 2010-08-15 NOTE — Letter (Signed)
Summary: Patient Notice, Endo Biopsy Results  [facility]  [address]   [place], [place] [address]   Phone: [phone]  Fax: [phone]       August 05, 2010   [name] [address] [place], [place]  [address] 12/09/1912    Dear [name],  I am pleased to inform you that the biopsies taken during your recent endoscopic examination did not show any evidence of cancer upon pathologic examination.  Additional information/recommendations:    __ Please call [phone] to schedule an office visit  __ Continue with the treatment plan as outlined on the day of your exam.  __ You should have a repeat endoscopic examination in 3 months   Please call us if you are having persistent problems or have questions about your condition that have not been fully answered at this time.  Sincerely,    Hendricks Limes LPN  [facility] Ph: [phone]   Fax: [phone]

## 2010-08-15 NOTE — Assessment & Plan Note (Addendum)
[name] is a 77 year old African American female with a history of gastritis in 2009, and has recently presented with a large ulcer as evidenced by upper endoscopy likely related to nonsteroidals in Mid-March. She has done extremely well since discharge. She continues to take Protonix twice a day. We have instructed her to continue this Protonix twice a day, and we will see her back in approximately 3 months to set her up for an upper endoscopy to reassess healing of the ulcer. Both the patient and the granddaughter  stated understanding. There were no further questions; we will see her back in 3 months or sooner as indicated. We will also have her complete a complete blood count today.

## 2010-08-15 NOTE — Progress Notes (Signed)
Referring Provider: Colette Ribas, MD Primary Care Physician:  Colette Ribas, MD  Chief Complaint  Patient presents with  . Follow-up    from hospital abdominal pains, discuss EGD    HPI:  [name] is a 77 year old African American female who presents to clinic today in follow-up from hospital admission. She was admitted in mid-March for abdominal pain, weight loss, and an abnormal stomach on CT. She actually has a history of gastritis in the past as diagnosed on endoscopy by Dr. Darrick Penna in 2009; the biopsies were negative for malignancy and also negative for H. pylori. She also had a peptic stricture which was dilated. At that time she also had a colonoscopy which showed extensive diverticulosis. During this admission for March 2012, she actually underwent an upper endoscopy which showed a large ulcer crater likely secondary to NSAID use. She was started on Protonix twice a day and has been doing extremely well with this. She presents with her granddaughter today and she denies any nausea, vomiting, dysphagia, odynophagia,or abdominal pain. She has a good appetite. She is doing quite well. She is having no evidence of melena or bright red blood per rectum.  Past Medical History  Diagnosis Date  . Urinary retention   . Gastritis   . Esophageal ulcer   . Salmonella sepsis   . Degenerative disk disease   . Spinal stenosis   . Asthma   . COPD (chronic obstructive pulmonary disease)   . Diabetes mellitus, type 2   . Diverticulosis of colon   . GERD (gastroesophageal reflux disease)   . Hypertension   . Osteoarthritis   . Hiatal hernia   . Pancreatitis   . S/P endoscopy 2009    Dr. Darrick Penna: gastritis  . Diverticulitis   . S/P endoscopy March 2012    Dr. Jena Gauss: NSAID induced ulcer    Past Surgical History  Procedure Date  . Cervical fusion   . Cholecystectomy     Current Outpatient Prescriptions  Medication Sig Dispense Refill  . budesonide (PULMICORT) 0.25 MG/2ML  nebulizer solution Take 0.25 mg by nebulization daily.        . fluticasone-salmeterol (ADVAIR HFA) 45-21 MCG/ACT inhaler Inhale 2 puffs into the lungs 2 (two) times daily.        . furosemide (LASIX) 20 MG tablet Take 20 mg by mouth 2 (two) times daily.        Marland Kitchen L-Methylfolate-B6-B12 (METANX) 3-35-2 MG TABS Take by mouth.        . levalbuterol (XOPENEX) 0.63 MG/3ML nebulizer solution Take 1 ampule by nebulization every 4 (four) hours as needed.        . lidocaine (LIDODERM) 5 % Place 1 patch onto the skin daily. Remove & Discard patch within 12 hours or as directed by MD       . montelukast (SINGULAIR) 10 MG tablet Take 10 mg by mouth at bedtime.        . pantoprazole (PROTONIX) 40 MG tablet Take 40 mg by mouth 2 (two) times daily.        . Potassium 75 MG TABS Take 75 mg by mouth 1 dose over 46 hours.        . Probiotic Product (FLORA-Q 2) CAPS Take by mouth.        . valsartan (DIOVAN) 40 MG tablet Take 40 mg by mouth daily.        Marland Kitchen zolpidem (AMBIEN) 5 MG tablet Take 5 mg by mouth at bedtime as needed.        Marland Kitchen  aspirin 81 MG tablet Take 81 mg by mouth daily.        Marland Kitchen esomeprazole (NEXIUM) 20 MG capsule Take 20 mg by mouth 2 (two) times daily.       . meloxicam (MOBIC) 7.5 MG tablet Take 7.5 mg by mouth daily.        Marland Kitchen omeprazole (PRILOSEC) 20 MG capsule Take 20 mg by mouth daily.        . pioglitazone (ACTOS) 15 MG tablet Take 15 mg by mouth daily.        . theophylline 80 MG/15ML elixir Take 100 mg by mouth 3 (three) times daily.        Marland Kitchen tolterodine (DETROL) 1 MG tablet Take 1 mg by mouth 2 (two) times daily.          Allergies as of 08/14/2010  . (No Known Allergies)    No family history on file.      Social History Main Topics  . Smoking status: Never Smoker   . Smokeless tobacco: Never Used  . Alcohol Use: No  . Drug Use: No  . Sexually Active: No      Review of Systems: Gen: Denies any fever, chills, anorexia CV: Denies chest pain, angina, palpitations Resp:  Denies dyspnea at rest, wheezing, and pleurisy. GI:SEE HPI Derm: Denies rash, itching, dry skin.  Psych: Denies depression, anxiety, memory loss, Heme: Denies bruising, bleeding, and enlarged lymph nodes.  Physical Exam: BP 140/70  Pulse 68  Temp 99.2 F (37.3 C)  Ht 5' (1.524 m)  Wt 100 lb 8 oz (45.587 kg)  BMI 19.63 kg/m2 General:   Alert,  Well-developed, well-nourished, pleasant and cooperative in NAD Head:  Normocephalic and atraumatic. Eyes:  Sclera clear, no icterus.   Conjunctiva pink. Mouth:  No deformity or lesions, dentition normal. Neck:  Supple; no masses or thyromegaly. Heart:  Regular rate and rhythm; no murmurs, clicks, rubs,  or gallops. Abdomen: +BS, soft, non-tender, non-distended, without rebound or guarding. No HSM noted Msk:  Symmetrical without gross deformities. Normal posture. Extremities:  Without  edema. Neurologic:  Alert and  oriented x4;  grossly normal neurologically. Skin:  Intact without significant lesions or rashes. Psych:  Alert and cooperative. Normal mood and affect.

## 2010-08-22 NOTE — Progress Notes (Signed)
Reminder in epic to repeat EGD in 3 months and OV is scheduled for 11/14/10 @ 130 w/AS

## 2010-10-01 NOTE — Discharge Summary (Signed)
NAMESATHVIKA, [name] NO.:  [id number]   MEDICAL RECORD NO.:  [id number]          PATIENT TYPE:  INP   LOCATION:  A316                          FACILITY:  [facility]   PHYSICIAN:  Osvaldo Shipper, MD     DATE OF BIRTH:  1912-07-07   DATE OF ADMISSION:  01/22/2008  DATE OF DISCHARGE:  09/10/2009LH                               DISCHARGE SUMMARY   PRIMARY MEDICAL DOCTOR:  Dr. Assunta Found with [facility].   Patient was seen in consultation by gastroenterology while she was  hospitalized.   Please see H and P dictated by Dr. Dorris Singh for details regarding  patient's presenting illness.   DISCHARGE DIAGNOSES:  1. Acute pancreatitis, improved.  2. Diabetes type 2.  3. History of chronic obstructive pulmonary disease stable.   BRIEF HOSPITAL COURSE:  Briefly, this is a 77 year old African American  female who presented to the hospital with complaints of abdominal pain.  Patient was evaluated in emergency department and she was found to have  elevated white count at 19,000.  She was also found to have an elevated  lipase of 620.  LFTs were normal.  She underwent CAT scan of the abdomen  and pelvis which revealed left upper quadrant inflammatory changes  thought to be pancreatitis versus diverticulitis.  No abscess was noted.  CT of the pelvis did not show any acute findings.  Patient was started  on antibiotics and GI was consulted.  Patient was given hydration, she  was given p.o.  Repeat CAT scan was done which showed inflammation along  the pancreatic tail compatible with pancreatitis but no comment was made  about the possible diverticulitis.  X-ray of the chest was done which  was unremarkable.  CT of the head showed no acute process and age-  related atrophy and white matter changes were noted.   It was felt that patient indeed did have pancreatitis rather than  diverticulitis.  No repeat lipase was done during this hospital stay.  Patient  clinically improved.  Her abdomen remains soft and nontender.  She was started on her diet which she has been tolerating quite well.  Her white count also has come down to 10,700 from the half-normal high  of 21,000.  She has remained afebrile as well.  She at this time is  stable for discharge.   Patient was always complaining of neck pain.  Upon further questioning,  she apparently fell sometime back in April and injured her neck.  She  has undergone MRI of the C-spine which reveals severe degenerative disk  disease but it is nothing acute.  She has even seen a neurosurgeon at  [facility] who had nothing to offer.  Patient not considered a surgical  candidate.  Patient is still complaining of a lot of neck pain.  I have  told her to discuss this issue with Dr. Phillips Odor.  Maybe she needs better  pain management.   She was see by physical therapy who felt that patient needed help with  transferring from bed when she was up and standing.  She could ambulate  with  a walker about 150 feet.  Patient's family was asking if she would  be a candidate for a skilled nursing facility.  I have told her that  this time she is not but I have told her that we can arrange home health  physical therapy.  I have also encouraged them to discuss this issue  with Dr. Phillips Odor.   Otherwise, the rest of her medical issues have remained stable.   On the day of discharge, patient is complaining of neck pain.  She is  having difficulty moving her neck.  Otherwise, she does not have any  other complaints.  Abdominal pain has improved.  She is enjoying her  lunch.  VITAL SIGNS:  Have all been stable.  ABDOMEN:  Soft, nontender and I think she is stable for discharge.   DISCHARGE MEDICATIONS:  1. Flora-Q 1 capsule daily.  2. Actos 30 mg daily.  3. Potassium chloride 10 mEq 3 times a day.  4. Lasix 40 mg daily.  5. Singulair 10 mg daily.  6. Theophylline 200 mg daily.  7. Diovan 160 mg daily.  8. Metanx 1  tablet daily.  9. Meloxicam 7.5 mg b.i.d.  10.Aspirin 81 mg daily.  11.Nexium 40 mg daily.  12.Detrol LA 4 mg daily.  13.Ambien 10 mg at bedtime as needed.   FOLLOWUP:  With Dr. Phillips Odor in 1 week, GI in 3 months.  She will require  a repeat CT scan at that time.   I explained to the patient that if she experiences worsening pain in her  stomach with nausea, vomiting she needs to seek attention immediately.   Home health will be arranged as discussed above.   DIET:  Low-fat, soft mechanical diet for now.   PHYSICAL ACTIVITY:  As before with a walker.   Total time on this discharge encounter about 35 minutes.      Osvaldo Shipper, MD  Electronically Signed     GK/MEDQ  D:  01/27/2008  T:  01/27/2008  Job:  [id number]   cc:   Corrie Mckusick, M.D.  Fax: [phone]   Kassie Mends, M.D.  [address]  [place] , [place] [address]

## 2010-10-01 NOTE — Op Note (Signed)
NAMEDAJIAH, KOOI           ACCOUNT NO.:  [id number]   MEDICAL RECORD NO.:  [id number]          PATIENT TYPE:  AMB   LOCATION:  DAY                           FACILITY:  [facility]   PHYSICIAN:  Kassie Mends, M.D.      DATE OF BIRTH:  [birth year]   DATE OF PROCEDURE:  01/03/2008  DATE OF DISCHARGE:  09/10/2007                               OPERATIVE REPORT   PROCEDURE:  Esophagogastroduodenoscopy with cold forceps biopsy.   REFERRING PHYSICIAN:  Corrie Mckusick, MD   INDICATION FOR EXAMINATION:  Mr. Cogar is a 77 year old female who  presents with early satiety, anorexia and upper GI series, which showed  incomplete distention of the gastric fundus with full wall thickening  suggestive of infiltrative process or tumor.   FINDINGS:  1. Distal esophageal stricture associated with area of ulceration.  It      narrowed the lumen to approximately 13 mm.  The distal esophagus      was dilated to 15 mm.  Otherwise no mass seen.  2. Diffuse erythema of the body and the antrum with occasional      erosion.  Biopsies obtained via cold forceps to evaluate for H.      pylori gastritis.  3. Normal duodenal bulb in second portion of the duodenum except for      multiple duodenal diverticula appreciated.  The diverticula did not      appear to involve in the ampulla.   DIAGNOSES:  1. Gastric wall thickening seen on upper gastrointestinal likely      secondary to gastritis.  Her gastritis is either nonsteroidal      antiinflammatory drug-induced or Helicobacter pylori gastritis.  2. Multiple duodenal diverticula.  3. Esophagitis.   RECOMMENDATIONS:  1. She should add Glucerna 3 times a day.  2. She has a follow up appointment in 6 weeks with Dr. Cira Servant for her      vomiting.  3. Will call her with the results of her biopsies.  4. No aspirin or NSAIDs for 30 days.  No anticoagulation for 5 days.  5. She should follow a soft mechanical diet.  She is given handout on      soft mechanical  diet and gastritis.  6. Prilosec 20 mg daily indefinitely as long as she requires aspirin      therapy.   MEDICATIONS:  1. Demerol 25 mg IV.  2. Versed 6 mg IV.   PROCEDURE TECHNIQUE:  Physical exam was performed and informed consent  was obtained from the patient explaining the benefits, risks, and  alternatives to the procedure.  The patient was connected to the monitor  and placed in the left lateral position.  Continuous oxygen was provided  by nasal cannula and  IV medicine administered through an indwelling  cannula.  After administration of sedation, the patient's esophagus was  intubated and a scope was advanced under direct visualization to the  second portion of the duodenum.  The scope was withdrawn into the  stomach and retroflexed view of the cardia was performed.  The cold  forceps biopsies were taken.  The Savary guidewire  was inserted as the  scope was removed slowly by carefully examining the color, texture,  anatomy, and integrity of the mucosa on the way out.  The dilators were  introduced over the guidewire from 12.8 mm to 15 mm.  The 15-mm dilator  passed with moderate resistance.  The patient was recovered in endoscopy  and discharged home in satisfactory condition.   PATH:  NSAID Gastritis. No H. pylori.      Kassie Mends, M.D.  Electronically Signed     SM/MEDQ  D:  01/03/2008  T:  01/03/2008  Job:  [id number]   cc:   Corrie Mckusick, M.D.  Fax: [phone]

## 2010-10-01 NOTE — Discharge Summary (Signed)
[name], [name]           ACCOUNT NO.:  [id number]   MEDICAL RECORD NO.:  [id number]          PATIENT TYPE:  OBV   LOCATION:  A331                          FACILITY:  [facility]   PHYSICIAN:  Lucita Ferrara, MD         DATE OF BIRTH:  04-29-1913   DATE OF ADMISSION:  11/16/2007  DATE OF DISCHARGE:  07/03/2009LH                               DISCHARGE SUMMARY   ADDENDUM:  Per gastroenterology recommendations, these are going to be added to  medication list.   1. Fibercon two cans twice daily.  2. Flora-Q daily for 30 days.   Followup planned with Dr. Cira Servant in two weeks.  (Order written in paper  medical chart to make and varify apointment)      Lucita Ferrara, MD  Electronically Signed     RR/MEDQ  D:  11/19/2007  T:  11/19/2007  Job:  [id number]

## 2010-10-01 NOTE — Assessment & Plan Note (Signed)
NAME:  CHELCEY, CAPUTO            CHART#:  [id number]   DATE:  12/07/2007                       DOB:  [birth year]   CHIEF COMPLAINT:  Followup of hospitalization for vomiting and diarrhea.   SUBJECTIVE:  The patient is a 77 year old African American female who we  recently seen during a hospitalization this month for further evaluation  of abdominal pain.  She presented with acute on chronic GI symptoms  consistent of vomiting, diarrhea, and worsening abdominal pain.  Abdominal films showed gas distended stomach.  She had had a prior EGD  revealing abnormal pyloric channel, but no significant stenosis noted at  that time.  Stool studies were ordered, but they were not recollected.  Diarrhea appeared to resolve at the time of admission.  Her last  colonoscopy was in September 2006, showed pancolonic diverticulosis.  She had a CT of the abdomen and pelvis, which revealed extensive colonic  diverticulosis, and degenerative disc and facet changes of the lumbar  spine otherwise, with no acute findings.  Her LFTs were normal except  albumin of 3.  TSH level normal at 0.880.  At time of discharge, her  creatinine was 1.02, hemoglobin of 11.2, white count 9900, MCV 102 with  B12 level greater than 2000, and folate level 2933.   The patient states that she has had no diarrhea since discharge.  She  continues on her chronic course of Dulcolax 1-2 tablets every other day.  She generally has a bowel movement with this.  Denies any blood in the  stool or melena.  She states she has no appetite.  If she eats a  moderate amount of food at a time, she has abdominal pain, nausea, and  vomiting.  If she eats light or just has soup, she tolerates this much  better.  She denies any heartburn, dysphagia, or odynophagia.  She tends  to eat slowly and this seems to help her process her food better.  Denies any hematemesis.   MEDICATION LIST:  1. Actos 30 mg daily.  2. Advair b.i.d.  3. Detrol 4 mg  daily.  4. Diovan 160 mg daily.  5. Lasix 40 mg daily.  6. Lidoderm patch p.r.n.  7. Pulmicort daily.  8. Singulair 10 mg daily.  9. Theophylline 200 mg b.i.d.  10.Xopenex p.r.n.  11.Ambien 10 mg at bedtime.  12.Aspirin 81 mg daily.  13.Metanx daily.  14.Potassium 10 mEq t.i.d.  15.Darvocet-N 100 p.r.n.  16.Oxycodone/APAP p.r.n.   ALLERGIES:  No known drug allergies.   PHYSICAL EXAMINATION:  VITAL SIGNS:  Weight 116 pounds, down 3 pounds  from her recent hospitalization.  Height 5 feet 2 inches, temp 97.9,  blood pressure 118/74, and pulse 64.  GENERAL:  Pleasant, thin, white female in no acute distress.  SKIN:  Warm and dry.  No jaundice.  HEENT:  Sclera nonicteric.  Oropharyngeal mucosa moist and pink.  Dentures in place.  Her speech is a little more comprehensible, but  still very difficult to understand.  SKIN:  Warm and dry.  No jaundice.  CHEST:  Lungs are clear to auscultation.  CARDIAC:  Regular rate and rhythm.  ABDOMEN:  Positive bowel sounds.  Abdomen is soft.  She has a minimal  lower abdominal tenderness and epigastric tenderness to deep palpation.  No rebound or guarding.  No abdominal bruits or hernias.  No  organomegaly or masses.  LOWER EXTREMITIES:  No edema.   IMPRESSION:  The patient is a 77 year old lady with chronic  gastrointestinal symptoms including postprandial epigastric discomfort  with nausea and intermittent vomiting.  Prior EGD revealed some pyloric  channel abnormality, but no significant stenosis at that time.  Recent  abdominal film showed gas distended stomach.  CT unremarkable, however,  with regards to her stomach.  There would be question of possibility of  either a delayed gastric emptying due to gastroparesis or possibility of  partial gastric outlet obstruction.  In addition, she has chronic  constipation on chronic Dulcolax therapy.  She has chronic postprandial  abdominal pain.  It is very difficult to obtain a detailed history  from  her as she is somewhat poor historian.  Etiology unclear at this time.  Recent CT unremarkable for acute disease and the films were reviewed  with Dr. Tyron Russell, by Dr. Cira Servant and there was no suspicion for mesenteric  ischemia based on that evaluation.   PLAN:  1. Nexium 40 mg daily, #20 samples provided.  2. MiraLax 17 g daily p.r.n. constipation instead of Dulcolax, but may      use Dulcolax every 2-3 days if need be.  Samples provided of      MiraLax.  3. Upper GI series.  4. Further recommendations to follow.   ADDENDUM:  UGI: abnl fundus. EGD 8/17.       Tana Coast, P.A.  Electronically Signed     Kassie Mends, M.D.  Electronically Signed    LL/MEDQ  D:  12/07/2007  T:  12/08/2007  Job:  [id number]   cc:   Corrie Mckusick, M.D.

## 2010-10-01 NOTE — Group Therapy Note (Signed)
[name], [name]           ACCOUNT NO.:  [id number]   MEDICAL RECORD NO.:  [id number]          PATIENT TYPE:  OBV   LOCATION:  A331                          FACILITY:  [facility]   PHYSICIAN:  Lucita Ferrara, MD         DATE OF BIRTH:  Oct 03, 1912   DATE OF PROCEDURE:  11/18/2007  DATE OF DISCHARGE:                                 PROGRESS NOTE   The patient examined by bedside today, showed no specific complaints.  She was admitted on June 30 with abdominal pain, nausea, vomiting and  acute renal failure.  She was admitted to [facility].  Dr. Cira Servant from  gastroenterology was consulted and recommended supportive measures, PPI,  amnio workup, stool studies.  She had no specific complaints overnight.   VITAL SIGNS:  Temperature 97.5, pulse 80, respirations 20, blood  pressure 146/69.   PHYSICAL EXAMINATION:  Pleasant elderly black female no acute distress.  HEENT:  Normocephalic, atraumatic.  CARDIOVASCULAR:  S1-S2, regular rate and rhythm.  ABDOMEN:  Soft, nontender, nondistended, positive bowel sounds.   LABORATORY DATA:  Vitamin B12 greater than 2000, folate 2933.  Urine  culture:  No growth.  CMP normal CBC normal.  TSH 0.88.   ASSESSMENT:  CT of the abdomen and pelvis done July 1 shows colonic  diverticulosis.   ASSESSMENT/PLAN:  Continue current treatment.  Estimated time of  discharge tomorrow.  The rest of the plans will depend on her progress  and consultant recommendations.      Lucita Ferrara, MD  Electronically Signed     RR/MEDQ  D:  11/18/2007  T:  11/18/2007  Job:  [id number]

## 2010-10-01 NOTE — Group Therapy Note (Signed)
NAME:  [name], [name] NO.:  [id number]   MEDICAL RECORD NO.:  [id number]          PATIENT TYPE:  INP   LOCATION:  A316                          FACILITY:  [facility]   PHYSICIAN:  Skeet Latch, DO    DATE OF BIRTH:  [birth year]   DATE OF PROCEDURE:  01/25/2008  DATE OF DISCHARGE:                                 PROGRESS NOTE   SUBJECTIVE:  [name] is a 77 year old African American female who  presented to [facility] with abdominal pain.  The patient states her  stomach has been hurting, waxing and waning, upon admission.  The  patient's initial CT scan showed possible diverticulitis and  pancreatitis.  GI was consulted.  Repeat CT with pancreatic protocol  showed pancreatitis.  The patient was started on IV antibiotics and she  has been continued on IV antibiotics.  She is on a full liquid diet.  The patient has been having a leukocytosis hovering at around the 20,000  range but today is decreased to 18,000 range.  She seems to be  improving.  She is alert and awake and seems to be improving.   OBJECTIVE:  VITAL SIGNS:  Temperature is 97.8, pulse 98, respirations  20, blood pressure 119/63.  She is satting 100% on room air.  CARDIOVASCULAR:  Regular S1, S2.  RESPIRATORY:  Decreased breath sounds, clear to auscultation.  No rales,  rhonchi or wheezing.  ABDOMEN:  Soft.  She does have some moderate epigastric pain on deep  palpation.  She has positive bowel sounds.  EXTREMITIES:  No clubbing, cyanosis, or edema.  MUSCULOSKELETAL:  She does have pain in her neck and hands and feet  secondary to arthritis.   LABORATORY DATA:  White count is 16,500, hemoglobin 10.1, hematocrit  29.4, platelet count is 343.  Sodium 136, potassium 3.9, chloride 105,  CO2 25, glucose 90, BUN 23, creatinine 1.20.   RADIOLOGIC STUDIES:  CT of her abdomen showed:  (1)  Inflammation around  the pancreatic tail compatible with pancreatitis.  No evidence of  pancreatic mass or  pancreatic necrosis.  (2) Tiny left pleural effusion  and left basilar atelectasis.   ASSESSMENT AND PLAN:  1. Acute pancreatitis.  At this time continue with IV antibiotics.      Awaiting stool cultures.  Will continue with pain control also.      Gastroenterology continues to follow the patient.  The patient's      diet is full liquids at this time.  2. Her hypertension seems to be stable.  3. Type 2 diabetes, also stable.  Continue with current medications      and sliding scale.  4. Osteoarthritis.  PT has been consulted and is following the patient      and the patient's Darvocet has been increased to 1 or 2 tabs every      4-6 hours p.r.n.      Skeet Latch, DO  Electronically Signed     SM/MEDQ  D:  01/25/2008  T:  01/25/2008  Job:  [id number]

## 2010-10-01 NOTE — Consult Note (Signed)
NAME:  [name], [name]           ACCOUNT NO.:  [id number]   MEDICAL RECORD NO.:  [id number]          PATIENT TYPE:  AMB   LOCATION:  DAY                           FACILITY:  [facility]   PHYSICIAN:  Kassie Mends, M.D.      DATE OF BIRTH:  1912-06-12   DATE OF CONSULTATION:  DATE OF DISCHARGE:                                 CONSULTATION   PROBLEM LIST:  1. Intermittent vomiting, diarrhea, and abdominal pain since 2003.  2. Diabetes.  3. Urinary retention.  4. Chronic pain.  5. Asthma.   SUBJECTIVE:  [name] is a 77 year old female who presents as a  return patient visit.  She was seen in August 2009 and complained of  early satiety and anorexia.  Her upper GI series suggested incomplete  distention of the gastric fundus with full wall thickening suggestive of  an infiltrative process.  Her biopsy showed diffuse erythema in the body  of the antrum with occasional erosions.  She had no evidence of H.  pylori infection.  In September 2009, she presented with 3-4 weeks of  abdominal pain, nausea, vomiting, and diarrhea.  She had a CT scan,  which showed inflammation in her left upper quadrant around the proximal  descending colon and the tail of the pancreas.  Her lipase was noted to  be 620.  She was placed on Cipro and Flagyl.  Her symptoms improved.  She was felt to have diverticulitis and pancreatitis.   She states the Inspra made her vomit.  She does not have any diarrhea.  She has 1-2 bowel movements a day.  She states buttermilk helps her  pain.  She denies any blood in her stool.  Her appetite has been  decreased.  She denies any heartburn or indigestion.  She states she has  pain in her stomach after eating certain foods.  She has only felt sick  in her stomach when she has not eaten anything.  Her family is still  concerned about her appetite.  In 2003, she weighed 138 pounds.  In July  2009, she weighed 126 pounds.  Today, she weighs 100 pounds.   MEDICATIONS AT HOME:  1. Actos.  2. Advair.  3. Detrol.  4. Diovan.  5. Lasix.  6. Lidoderm patch.  7. Pulmicort.  8. Singulair.  9. Theophylline.  10.Xopenex.  11.Ambien.  12.Aspirin.  13.Metanx.  14.Potassium.  15.Flora-Q.  16.Nexium 40 mg daily.  17.Meloxicam 7.5 mg b.i.d.   ASSESSMENT:  [name] is a 77 year old female, who has left upper  quadrant pain.  She had an upper gastrointestinal that just showed  gastritis. She was noted to have wall thickening on an upper  gastrointestinal series.  In the same area, she presented with abdominal  pain and nausea and vomiting, which showed inflammation in the  descending colon associated with the tail of the pancreas.  It is  conceivable that she had diverticulitis, which involved the tail of her  pancreas.  She needs to have her descending colon evaluated due to her  continued complaint of abdominal pain.  She needs to have the evaluation  to look for the presence of colon cancer as an etiology.  Thank you for allowing me to see [name] in consultation. My  recommendations follow.   RECOMMENDATIONS:  1. She will have a colonoscopy with a MiraLax bowel prep.  She will      take half of her Actos the day before her procedure and hold her      Actos a day after the procedure.  2. She is to have Boost or Glucerna 3 times a day.  3. Could consider checking her lipase, hepatic function panel, and      urinalysis.  4. Follow up in 2 months.  She may need to have a gastric-emptying      study. May consider Reglan.      Kassie Mends, M.D.  Electronically Signed     SM/MEDQ  D:  03/09/2008  T:  03/10/2008  Job:  [id number]   cc:   Corrie Mckusick, M.D.  Fax: [phone]

## 2010-10-01 NOTE — Consult Note (Signed)
NAME:  [name], [name]           ACCOUNT NO.:  [id number]   MEDICAL RECORD NO.:  [id number]          PATIENT TYPE:  INP   LOCATION:  A316                          FACILITY:  [facility]   PHYSICIAN:  R. Roetta Sessions, M.D. DATE OF BIRTH:  1913-01-19   DATE OF CONSULTATION:  DATE OF DISCHARGE:                                 CONSULTATION   REASON FOR CONSULTATION:  Acute diverticulitis.   HISTORY OF PRESENT ILLNESS:  [name] is a 77 year old African  American female.  She complains of 3-4 week history of abdominal pain,  nausea, vomiting, and diarrhea.  She notes the pain was initially in her  lower abdomen.  It occurred after every time she would eat.  She tells  me she has not had any further pain, nausea, vomiting, or diarrhea in  the last 24 hours.  She denies any fever or chills.  She denies any  heartburn or congestion.  She underwent EGD by Dr. Cira Servant on January 03, 2008.  She had distal esophageal stricture with an ulcer which was  dilated just like gastritis, esophagitis, and multiple duodenal  diverticula.  She had a CT of the abdomen and pelvis without contrast on  January 22, 2008.  She was found to have left-sided upper quadrant  inflammatory changes around the proximal descending colon and tail of  the pancreas.  She was started on Cipro 400 mg q.24 h., Flagyl 500 mg  q.8 h., and Protonix 40 mg daily.  She is now on a full liquid diet and  has been doing well and is reportedly pain-free at this time, although  she did receive a Darvocet this morning.   PAST MEDICAL AND SURGICAL HISTORY:  EGD as described in the HPI with  history of distal esophageal ulcer, stricture status post dilatation,  gastritis, duodenal diverticula, esophagitis with chronic GERD, asthma,  diabetes mellitus, hypertension, history of Salmonella sepsis,  osteoarthritis, degenerative disk disease, spinal stenosis, last  colonoscopy in September 2006, pancolonic diverticulosis,  cholecystectomy in  2003, and cervical fusion in 2004.   MEDICATIONS PRIOR TO ADMISSION:  1. Actos 30 mg daily.  2. Advair b.i.d.  3. Detrol 4 mg daily.  4. Diovan 160 mg daily.  5. Lasix 40 mg daily.  6. Singulair 10 mg daily.  7. Theophylline 200 mg daily.  8. Xopenex p.r.n.  9. Ambien 10 mg daily.  10.Meloxicam 7.5 mg b.i.d.  11.Nexium 40 mg daily.  12.Aspirin 81 mg daily.  13.Metanx once daily.  14.Potassium chloride 10 mEq daily.   ALLERGIES:  No known drug allergies.   FAMILY HISTORY:  There is no known family history of __________ of the  liver, GI problems.   SOCIAL HISTORY:  [name] lives with her son.  She has 9 living  children.  One daughter passed away secondary to breast carcinoma in her  70s.  She denies any tobacco, alcohol, or drug use.  She is a widow.   REVIEW OF SYSTEMS:  See HPI, otherwise negative.   PHYSICAL EXAMINATION:  VITAL SIGNS:  Temperature 98.3, pulse 93,  respirations 18, blood pressure 109/55.  GENERAL:  Mr. Muffley is an elderly African American female who is  alert, oriented, appropriate, cooperative, in no acute distress.  HEENT:  Sclerae clear, nonicteric.  Conjunctivae clear.  Oropharynx pink  and moist without any discrete lesions.  NECK:  Supple without mass or thyromegaly.  CHEST:  Heart regular rate and rhythm.  Normal S1, S2.  No murmurs,  clicks, rubs, or gallops.  LUNGS:  With decreased breath sounds bilaterally.  No acute distress.  ABDOMEN:  Positive bowel sounds x4.  No bruits auscultated.  Soft,  nontender, and nondistended without palpable mass or hepatosplenomegaly,  no rebound tenderness or guarding.  EXTREMITIES:  Without edema.   LABORATORY STUDIES:  Hemoglobin 10.5, hematocrit 31, platelets 327,  white blood cells 21.6.  Calcium 8.7, sodium 136, potassium 3.9,  chloride 105, CO2 25, BUN 23, creatinine 1.2, glucose 90, and lipase  620.   IMPRESSION:  [name] is a 77 year old female with abdominal pain,  nausea,  vomiting, diarrhea, and CT suggestive of acute diverticulitis.  Clinically, she has responded to antibiotic therapy and is improving.  White blood cell count remains high, however, the lipase elevation and  inflammatory changes of pancreatic tail with subsequent secondary  pancreatitis.   PLAN:  1. I agree with Cipro and Flagyl.  2. We will review films with Dr. Jena Gauss.  3. Add C. diff to stool.  4. Add Flora-Q once daily.   I would like to thank the [facility] team for taking care of Ms.  [name].      Lorenza Burton, N.P.      Jonathon Bellows, M.D.  Electronically Signed   KJ/MEDQ  D:  01/24/2008  T:  01/24/2008  Job:  [id number]   cc:   Corrie Mckusick, M.D.  Fax: [phone]

## 2010-10-01 NOTE — Group Therapy Note (Signed)
NAME:  [name], [name]           ACCOUNT NO.:  [id number]   MEDICAL RECORD NO.:  [id number]          PATIENT TYPE:  OBV   LOCATION:  A331                          FACILITY:  [facility]   PHYSICIAN:  Lucita Ferrara, MD         DATE OF BIRTH:  23-Jan-1913   DATE OF PROCEDURE:  DATE OF DISCHARGE:                                 PROGRESS NOTE   Full history and physical examination and progress notes reviewed.  The  patient was admitted back on November 16, 2007, with abdominal pain, nausea,  vomiting and acute renal failure.  She was admitted to the medical  telemetry floor and a consultation by Dr. Cira Servant from gastroenterology  service was requested, who recommended to continue supportive measures,  continue PPI, anemia workup, stool studies.  It looks like she is for a  CT of the abdomen and pelvis with contrast.  Overnight, she had no  specific complaints.   PHYSICAL EXAMINATION:  HEENT:  Normocephalic, atraumatic.  Sclerae  nonicteric.  NECK:  Supple.  No JVD.  No carotid bruits.  CARDIOVASCULAR:  S1-S2.  Regular rate and rhythm.  ABDOMEN:  Soft, nontender, nondistended.  Positive bowel sounds.  LUNGS:  Clear to auscultation bilaterally.  No rhonchi, rales or wheeze.  EXTREMITIES:  No clubbing, cyanosis or edema.   LABORATORY DATA:  Phosphorus 3.2, magnesium 2.1.  Complete metabolic  panel shows a BUN of 36, creatinine 1.27 and albumin of 3.1.  CBC - MCV  is 102, INR 1.  Radiological data:  CT scan of the pelvis shows  extensive colonic diverticulosis without definitive evidence of colitis.  CT scan of the abdomen shows no specific findings, atherosclerotic  calcification of coronary arteries or abdominal aorta.   PLAN:  Continue per consultant recommendations.  Question if patient  needs EGD.  Macrocytosis needs to be evaluated B12 folate level.  This  would also explain some changes in mental status.  She will likely need  repletement.  The rest of the plans will depend on her progress,  consultant recommendations and results.      Lucita Ferrara, MD  Electronically Signed     RR/MEDQ  D:  11/17/2007  T:  11/17/2007  Job:  [id number]

## 2010-10-01 NOTE — Op Note (Signed)
NAMEMERISSA, RENWICK           ACCOUNT NO.:  [id number]   MEDICAL RECORD NO.:  [id number]          PATIENT TYPE:  AMB   LOCATION:  DAY                           FACILITY:  [facility]   PHYSICIAN:  Kassie Mends, M.D.      DATE OF BIRTH:  15-May-1913   DATE OF PROCEDURE:  03/22/2008  DATE OF DISCHARGE:                               OPERATIVE REPORT   REFERRING PHYSICIAN:  Corrie Mckusick, MD   PROCEDURE:  Colonoscopy.   INDICATION FOR EXAM:  [name] is a 77 year old female who  presented with inflammation of the proximal descending colon in the tail  of her pancreas.  She was placed on Cipro and Flagyl, and her symptoms  improved.  She presents for evaluation of this abnormal finding on CT  scan.   FINDINGS:  1. Multiple, frequent diverticula seen from the sigmoid colon to the      hepatic flexure.  No polyps, masses, inflammatory changes, or AVMs      seen.  2. Normal retroflex view of the rectum.   DIAGNOSIS:  [name] likely had diverticulitis extending into the  tail of the pancreas causing pancreatitis.   RECOMMENDATIONS:  1. High-fiber diet.  She is given a handout on high-fiber diet and      diverticulosis.  2. Benefiber twice a day.   MEDICATIONS:  1. Demerol 50 mg IV.  2. Versed 2 mg IV.   PROCEDURE TECHNIQUE:  Physical exam was performed.  Informed consent was  obtained from the patient after explaining the benefits, risks, and  alternatives to the procedure.  The patient was connected to the monitor  and placed in left lateral position.  Continuous oxygen was provided by  nasal cannula and IV medicine administered through an indwelling  cannula.  After administration of sedation and rectal exam, the  patient's rectum was intubated and the scope was advanced under direct  visualization to the cecum.  The scope was removed slowly by carefully  examining the color, texture, anatomy, and integrity of the mucosa on  the way out.  The patient did have a  moderate  amount of retained particulate matter in the left colon and polyps less  than 1 cm would have been easily missed.  The scope was removed slowly  by carefully examining the color, texture, anatomy, and integrity of the  mucosa on the way out.  The patient was recovered in Endoscopy and  discharged home in satisfactory condition.      Kassie Mends, M.D.  Electronically Signed     SM/MEDQ  D:  03/22/2008  T:  03/23/2008  Job:  [id number]   cc:   Corrie Mckusick, M.D.  Fax: [phone]

## 2010-10-01 NOTE — Consult Note (Signed)
[name], [name]           ACCOUNT NO.:  [id number]   MEDICAL RECORD NO.:  [id number]          PATIENT TYPE:  OBV   LOCATION:  A331                          FACILITY:  [facility]   PHYSICIAN:  Kassie Mends, M.D.      DATE OF BIRTH:  04/16/1913   DATE OF CONSULTATION:  11/17/2007  DATE OF DISCHARGE:                                 CONSULTATION   REASON FOR CONSULTATION:  Abdominal pain.   PRIMARY CARE PHYSICIAN:  [facility].   HISTORY OF PRESENT ILLNESS:  The patient is a 77 year old African  American female who presented to the emergency department with  complaints of abdominal pain, nausea, vomiting, and diarrhea.  She  states that she has chronic GI symptoms and has difficulty eating  certain foods because of abdominal pain.  She denies any heartburn, but  does have occasional belching or indigestion.  Over the last 2 days, she  has had a worsening of her abdominal pain associated with vomiting and  diarrhea.  She is a very difficulty historian, and it is also very  difficult to understand her speech.  She does not quantify how much  vomiting or diarrhea she has had.  When she was evaluated in the  emergency department, she was clearly dehydrated.  She has had no  vomiting or diarrhea since admission.  She points to her lower abdomen  and epigastrium as far as the location of her pain.  She denies any  dysphagia, odynophagia, melena, or rectal bleeding.  She generally has  chronic constipation and takes laxatives as needed.  She denies any  fever or chills, dysuria, or hematuria.   MEDICATIONS:  1. Actos 30 mg daily.  2. Advair b.i.d.  3. Detrol 4 mg daily.  4. Diovan 160 mg daily.  5. Lasix 40 mg daily.  6. Lidoderm patch.  7. Pulmicort.  8. Singulair 10 mg daily.  9. Theophylline 200 mg b.i.d.  10.Xopenex p.r.n.  11.Ambien 10 mg daily.  12.Aspirin 81 mg daily.  13.Metanx daily.  14.Potassium chloride 10 mEq t.i.d.  15.Darvocet-N 100 p.r.n.  16.Oxycodone/APAP p.r.n.   ALLERGIES:  No known drug allergies.   PAST MEDICAL HISTORY:  1. Asthma.  2. Diabetes mellitus.  3. Hypertension.  4. History of Salmonella sepsis a few years ago.  5. Osteoarthritis.  6. Degenerative disc disease.  7. Spinal stenosis.  8. She had an EGD and colonoscopy in September 2006.  She had a high-      grade Schatzki's ring, status post 54 French Maloney dilator, small      sliding hiatal hernia, deformed but patent pyloric channel, with no      evidence of ulceration, and pancolonic diverticulosis.  9. She had a cervical fusion in 2004.  10.Cholecystectomy in 2003.   FAMILY HISTORY:  Negative for chronic GI illnesses, colorectal cancer.   SOCIAL HISTORY:  She lives with her son.  She had 10 children, a  daughter deceased secondary to breast cancer in her 52s.  No tobacco or  alcohol use.  She is widowed.   REVIEW OF SYSTEMS:  GI:  See HPI.  CONSTITUTIONAL:  She states her  weight fluctuates.  CARDIOPULMONARY:  No chest pain, no shortness of  breath.  She has asthma at baseline, but it has not been very active  lately.  GU:  See HPI.   PHYSICAL EXAMINATION:  VITAL SIGNS:  Temperature 98.8, pulse 86,  respirations 20, blood pressure 110/60, height 62 inches, weight 54.2  kg.  GENERAL:  Pleasant elderly black female, in no acute distress.  She is  alert and oriented.  Her speech is difficult to understand.  SKIN:  Warm and dry.  No jaundice.  HEENT:  Sclerae anicteric.  Oropharyngeal mucosa moist and pink.  She is  edentulous.  No exudate, erythema, or lesions.  No lymphadenopathy.  CHEST:  Lungs are clear to auscultation.  CARDIAC:  Reveals regular rate and rhythm.  Normal S1, S2.  No murmurs,  rubs, or gallops.  ABDOMEN:  Positive bowel sounds.  Abdomen is soft, nondistended.  She  has mild lower abdominal tenderness to deep palpation.  She has mild  epigastric tenderness as well.  No rebound or guarding.  No abdominal  bruits or hernias.   No organomegaly or masses.  LOWER EXTREMITIES;  No edema.   LABORATORIES:  White count 10,400, hemoglobin on admission 12.1, 11.3  today, her MCV was elevated at 102.1, platelets 191,000.  Sodium 140,  potassium 4.4.  BUN on admission was 44, down to 36, creatinine on  admission was 1.47, down to 1.27, glucose 96.  Total bilirubin 0.4,  alkaline phosphatase 60, AST 18, ALT 27, albumin 3.1.  PTT 32, INR 1,  lipase 20.  Urinalysis was negative.  Abdominal films revealed gas-  distended stomach.  Small and large bowel loops were nondilated.   IMPRESSION:  The patient is a 77 year old female with acute on chronic  GI symptoms.  She complains of acute onset vomiting, diarrhea, and  worsening abdominal pain.  She is a very difficult historian, and also  difficult to understand her speech.  From what I gather, she also has  some chronic issues with certain foods causing abdominal discomfort.  She generally has chronic constipation.  Abdominal films showed gas-  distended stomach.  She had a prior EGD revealing an abnormal pyloric  channel, but at the time there was no stenosis noted.  Would question  possibly a gastric outlet obstruction.  She also has lower abdominal  tenderness and diarrhea, unclear etiology at this point.  Notably, she  has had no vomiting or stools since her admission.   RECOMMENDATIONS:  1. Continue supportive measures.  2. Continue PPI therapy.  3. Consider B12, folate levels, given elevated MCV.  Will leave up to      attending.  4. Will follow up stool studies if any are available.  5. Will discuss further with Dr. Cira Servant.  She may need to have an EGD      or upper GI series, and if her lower abdominal pain is ongoing with      no clear etiology, may recommend CT at some point.      Tana Coast, P.A.      Kassie Mends, M.D.  Electronically Signed    LL/MEDQ  D:  11/17/2007  T:  11/17/2007  Job:  [id number]   cc:   [facility]   [employer]

## 2010-10-01 NOTE — H&P (Signed)
[name], STREBEL NO.:  [id number]   MEDICAL RECORD NO.:  [id number]          PATIENT TYPE:  INP   LOCATION:  A316                          FACILITY:  [facility]   PHYSICIAN:  Dorris Singh, DO    DATE OF BIRTH:  1912/06/25   DATE OF ADMISSION:  01/22/2008  DATE OF DISCHARGE:  LH                              HISTORY & PHYSICAL   HISTORY OF PRESENT ILLNESS:  The patient is a 77 year old African  American female who presented to the [facility] emergency room with the  chief complaint of abdominal pain.  Her primary care doctor is Dr.  Phillips Odor.  She originally came in with a complaint of headache, but also  states that over the last couple days she has had a bellyache that seems  to get worse, kind of waxing and waning and has not been able to keep  down any of her medications.   PAST MEDICAL HISTORY:  Significant for asthma, hypertension, diabetes.   SOCIAL HISTORY:  She is a nonsmoker, nondrinker.  No drug abuse.   ALLERGIES:  She has no known drug allergies.   MEDICATIONS:  1. Actos 30 mg q.a.m.  2. Advair discus 200/50 two inhalations b.i.d.  3. Detrol 4  mg once a day.  4. Diovan 60 mg once a day.  5. Furosemide 40 mg once a day.  6. Lidoderm topical patch 5%, do not have frequency.  7. Pulmicort 0.5 mg/2 ml inhaler, do not have a frequency.  8. Singulair 10 mg q.a.m.  9. Theophylline 200 mg b.i.d.  10.Xopenex 0.63, do not have a frequency.  11.Zolpidem 10 mg p.r.n. p.o. q.h.s.  12.Aspirin 81 mg p.o. daily.  13.Metanx , no dose given once a day.  14.Potassium chloride 10 mEq three times a day.  15.Propoxyphene and acetaminophen, no dose given as needed.  16.Oxycodone acetaminophen, no dose given as needed.  17.Multivitamins once a day.  18.Nexium 40 mg once a day.  19.Calcium 600 mg with vitamin D.  They got this medication list from      pharmacy, there were no doses included.  20.Meloxicam 7.5 mg p.o. b.i.d.   REVIEW OF SYSTEMS:  CONSTITUTIONAL:   Negative for fever, chills, but  positive for changes in her appetite.  EYES:  Negative for blurred  vision or eye pain.  EARS, NOSE, MOUTH, THROAT:  Negative for nasal  congestion or sore throat.  CARDIOVASCULAR: Negative for chest pain or  pleural edema.  RESPIRATORY:  Negative for cough.  GASTROINTESTINAL:  Positive for nausea, vomiting, negative for diarrhea, abdominal pain.  GU:  Negative for dysuria, hematuria.  MUSCULOSKELETAL:  Positive for  arthritis and stiffness.  SKIN:  Negative for pruritus, rash.  NEUROLOGICAL:  Positive for headache.  Negative for weakness, numbness.   PHYSICAL EXAMINATION:  VITAL SIGNS:  Temperature 98.4, pulse 91,  respirations 18, blood pressure 119/63.  GENERAL:  The patient is a well-developed, well-nourished, well-hydrated  77 year old [place] American female who is in no acute distress.  HEENT:  Head is normocephalic, atraumatic.  Eyes are normal appearance.  Ears:  External ears are within normal limits.  Nose: Turbinates term  are moist.  Throat:  No erythema noted.  NECK:  Supple.  No lymphadenopathy.  HEART:  Regular rate and rhythm.  No murmur noted.  LUNGS:  Clear to auscultation bilaterally.  ABDOMEN:  Soft with some tenderness in deep palpation of the lower left  quadrant.  EXTREMITIES:  Positive pulses, no edema or cyanosis noted.  NEUROLOGICAL:  Cranial nerves II-XII grossly intact.  Normal speech, A&O  x3.   STUDIES:  CT of her abdomen states that left upper quadrant inflammatory  changes are noted.  This appears to be involving the proximal descending  colon and tail of the pancreas.  Given the extensive diverticular  changes involving the colon, findings most likely represents acute  diverticulitis with secondary involvement of the pancreas.  Acute focal  pancreatitis involving the tail of the pancreas with a differential  considerations and correlation with the patient's lipase in place was  advised.  No evidence for abscess  formation or pseudocyst, and CT of the  pelvis, no acute pelvic findings.  Advanced lumbar spondylosis and  positive generation changes.  __________pneumonia chest.  No acute  cardiopulmonary findings.  Chronic lung change.  CT of the head:  No  acute intracranial findings.  No mass or lesions, age-related cerebral  atrophy and periventricular white matter disease.  Sodium is 135,  potassium 4.7, chloride 106, carbon dioxide 23, glucose 83, BUN 30,  creatinine 1.49.  her urine is negative.  White count is 19, hemoglobin  11, hematocrit 33, platelet count 391.   ASSESSMENT/PLAN:  1. Diverticulitis.  2. Hypertension.  3. Diabetes.   PLAN:  Will get a GI consult for Monday.  We do not have coverage this  weekend.  Also, will put the patient on IV fluids.  Will start her on  antibiotics which include Flagyl and Cipro and put her on DVT and GI  prophylaxis.  Will start her on a clear liquid diet to see how she does.  If she cannot tolerate it, will make her n.p.o.  Will make sure she has  Phenergan or antiemetics on board and will continue to monitor her and  change therapy as necessary.  Will place the patient on home medications  and do sliding scale for diabetes.  Will change therapy as necessary.      Dorris Singh, DO  Electronically Signed     CB/MEDQ  D:  01/22/2008  T:  01/23/2008  Job:  [id number]

## 2010-10-01 NOTE — H&P (Signed)
NAMECHARISA, TWITTY NO.:  [id number]   MEDICAL RECORD NO.:  [id number]          PATIENT TYPE:  EMS   LOCATION:  ED                            FACILITY:  [facility]   PHYSICIAN:  Dorris Singh, DO    DATE OF BIRTH:  05-14-1913   DATE OF ADMISSION:  11/16/2007  DATE OF DISCHARGE:  LH                              HISTORY & PHYSICAL   CHIEF COMPLAINT:  Abdominal pain, nausea and vomiting.   PRIMARY CARE PHYSICIAN:  Dr. Phillips Odor and Dr. Regino Schultze.   HISTORY OF PRESENT ILLNESS:  The patient is a 77 year old African  American female who presented to [facility] emergency room with a chief  complaint of abdominal pain, nausea and vomiting.  She said this has  been going on for about 1 day, and the onset has been acute and  consistent.  She states that the pain is located all over, has no  radiation, it is characterized as vague.  The patient also recounts a  history of having these same symptoms a couple years ago when she was  hospitalized for a week, and she was concerned that this may be a repeat  of that episode.  She has had diarrhea, nausea and vomiting, but denies  any fever or chills.   PAST MEDICAL HISTORY:  1. Hypertension.  2. Diabetes.  3. Asthma.   SOCIAL HISTORY:  She is a nonsmoker and nondrinker.  No drug abuse.  She  currently lives with her son at home.   ALLERGIES:  NO KNOWN DRUG ALLERGIES.   MEDICATIONS:  1. She is on Actos 30 mg every morning.  2. Advair Diskus 250/60 b.i.d.  3. Detrol 4 mg once a day.  4. Diovan 160 mg once a day.  5. Furosemide 40 mg once a day.  6. Lidoderm topical patch daily.  7. Pulmicort inhaler 0.5/2 mL, no frequency given.  8. Singulair 10 mg p.o. daily.  9. Theophylline 200 mg b.i.d.  10.Xopenex inhalers 0.63 mg p.r.n.  11.Ambien 10 mg every evening.  12.Aspirin 81 mg daily.  13.Metanx once a day, there is no dose.  14.Potassium 10 mEq three times a day.  15.Darvocet-N 100 as needed.  16.Oxycodone.  17.Acetaminophen 5/500 as needed.   REVIEW OF SYSTEMS:  CONSTITUTIONAL:  Positive for weakness.  EYES:  All  negative.  EARS, NOSE, MOUTH AND THROAT:  Negative.  CARDIOVASCULAR:  Negative for chest pain or palpitations.  RESPIRATORY:  Negative for  cough or dyspnea.  GASTROINTESTINAL:  Positive for nausea, vomiting and  abdominal pain.  GU:  Negative for incontinence or dysuria.  MUSCULOSKELETAL: Negative for back pain or arthralgias.  SKIN:  Negative  for rashes.  NEURO:  Negative for dizziness or syncope.   PHYSICAL EXAMINATION:  VITAL SIGNS:  Blood pressure is 104/58, pulse  rate 74, respirations 18, temperature 97.6.  O2 sat 98.  CONSTITUTIONALLY:  The patient is a well-developed, well-nourished 46-  year-old African female who looks younger than her stated age.  HEAD:  Normocephalic, atraumatic.  EYES:  EOMI.  PERLA.  No scleral icterus.  EARS:  TMs visualized bilaterally.  THROAT:  No erythema or exudate.  Positive upper and lower dentures.  NOSE:  Turbinates are moist.  NECK:  Supple.  No lymphadenopathy.  HEART:  Regular rate and rhythm.  LUNGS: Clear to auscultation bilaterally.  No rales, wheezes or rhonchi.  ABDOMEN:  Soft, distended.  Tenderness with deep palpation.  Hyperactive  bowel sounds.  EXTREMITIES:  Positive pulses.  No ecchymosis, edema or  cyanosis.   LABORATORY DATA:  Currently, white count is 9.8, hemoglobin is 12.1,  hematocrit 36.0, platelet count of 247.  Sodium is 130, potassium 4.0,  chloride 105, carbon dioxide 27, glucose 149, BUN 44, creatinine 1.47.  Her urine is negative for nitrates and leukocyte esterase.  Her  abdominal x-ray showed no acute abnormality.  Note is made of a  distended stomach.   ASSESSMENT/PLAN:  1. Abdominal pain.  2. Nausea and vomiting.  3. Acute renal failure.   Will admit the patient to the service of [facility] and observation to  3A.  Will consult GI in the morning.  Will obtain stool cultures, C.  diff and blood work  as well.  Will hydrate her at 100 mL per hour.  Will  place the patient on home medications.  Will do DVT and GI prophylaxis.  Will continue to monitor her and change therapy as necessary.      Dorris Singh, DO  Electronically Signed     CB/MEDQ  D:  11/16/2007  T:  11/16/2007  Job:  [id number]

## 2010-10-01 NOTE — Discharge Summary (Signed)
[name], [name]           ACCOUNT NO.:  [id number]   MEDICAL RECORD NO.:  [id number]          PATIENT TYPE:  OBV   LOCATION:  A331                          FACILITY:  [facility]   PHYSICIAN:  Lucita Ferrara, MD         DATE OF BIRTH:  05/05/1913   DATE OF ADMISSION:  11/16/2007  DATE OF DISCHARGE:  07/03/2009LH                               DISCHARGE SUMMARY   DISCHARGE DIAGNOSES:  1. Acute onset of vomiting, diarrhea.  2. Abdominal pain.  3. Food intolerability.  4. History of chronic constipation.  5. Questionable EGD proven abnormal pyloric channel abnormality in the      past.   CONSULTATIONS:  Dr. Cira Servant from gastroenterology services.   CONDITION ON DISCHARGE:  Stable.   PROCEDURES:  The patient had a CT scan of the abdomen and pelvis which  showed colonic diverticulosis, degenerative disk and facet disease  changes; otherwise, normal.  The patient also had a digital exam of the  abdomen and the chest which showed no acute abnormalities  Note there is  distended stomach.   BRIEF HISTORY OF PRESENT ILLNESS:  [name] is a 77 year old female  who presented to [facility] emergency room with chief  complaint of nausea, vomiting, abdominal pain for one day.  She has had  similar symptoms two years prior, and per records it looked like she was  diagnosed with gastroenteritis back in September 2006.  She underwent  the EGD back in September 2006 which showed a high grade Schatzki's ring  that disrupts bypassing this #54 Maloney dilator, small hiatal hernia,  deformed but patent pyloric channel.  She was admitted to the medical  telemetry unit.  She was kept for observation.  Gastroenterology was  consulted.  Stool cultures were requested.  Hydration was initiated with  a normal saline at 100 cc/hour.  Diet was kept n.p.o. for bowel rest and  then advanced accordingly.  Her nausea, vomiting, and underlying pain  resolved.  Today she is able to tolerate a  lactose free diet soft.  This  will be her discharge diet.   DISCHARGE MEDICATIONS:  She has no medication conciliation listed.  Apparently November 16, 2007 they spoke with the daughter, Britta Mccreedy, who  stated to bring the medications first thing in the morning and she never  did, but in review of her other medications that she has been recently  discharged with these are the following:   1. Actos 30 mg p.o. daily in the morning.  2. Advair b.i.d.  3. Detrol 4 mg daily.  4. Diovan 160 mg p.o. daily.  5. Lidoderm patch 5% daily.  6. Singulair 10 mg p.o. daily  7. Theophylline 200 mg b.i.d.  8. Metanx oral daily  9. Potassium chloride 10 mEq p.o. daily.  10.Darvocet-N 100 two tablets p.o. q.4 h. as needed.  11.Detrol 4 mg p.o. daily.   Note that this list is not complete until medication reconciliation form  has been completely filled out.   FOLLOWUP:  As needed with primary care doctor in four weeks and with Dr.  Cira Servant in three  weeks.  All instructions given to the patient.      Lucita Ferrara, MD  Electronically Signed     RR/MEDQ  D:  11/19/2007  T:  11/19/2007  Job:  [id number]

## 2010-10-04 NOTE — Group Therapy Note (Signed)
NAMELATIFA, NOBLE           ACCOUNT NO.:  [id number]   MEDICAL RECORD NO.:  [id number]          PATIENT TYPE:  ORB   LOCATION:  S152                          FACILITY:  [facility]   PHYSICIAN:  Edward L. Juanetta Gosling, M.D.DATE OF BIRTH:  20-Jul-1912   DATE OF PROCEDURE:  06/11/2004  DATE OF DISCHARGE:                                   PROGRESS NOTE   SUBJECTIVE:  [name] is an 77 year old who was admitted after having  had severe weakness following a gastroenteritis, and she became very  dehydrated and became very weak and unable to ambulate. She has been working  hard in physical therapy, says she is feeling great now. Plans are being  made for her to be discharged next week. Today, she is up and walking, using  a wheeled walker.   PHYSICAL EXAMINATION:  CHEST:  Clear.  HEART:  Regular.  GENERAL:  She looks very comfortable.   ASSESSMENT:  She seems to be doing much better than previously planned. As  previously mentioned, we will hopeful that she will be able to be discharged  back home tomorrow. Her family is here with her today. They say that she  will go with them.      ELH/MEDQ  D:  06/11/2004  T:  06/11/2004  Job:  [id number]

## 2010-10-04 NOTE — Discharge Summary (Signed)
NAMELANISE, MERGEN           ACCOUNT NO.:  [id number]   MEDICAL RECORD NO.:  [id number]          PATIENT TYPE:  INP   LOCATION:  A302                          FACILITY:  [facility]   PHYSICIAN:  Kirk Ruths, M.D.DATE OF BIRTH:  28-Dec-1912   DATE OF ADMISSION:  01/09/2005  DATE OF DISCHARGE:  08/28/2006LH                                 DISCHARGE SUMMARY   DISCHARGE DIAGNOSES:  1.  Gastroenteritis.  2.  Mild chronic anemia.  3.  Long-standing asthmatic.   HOSPITAL COURSE:  This is an 77 year old female how was admitted through the  emergency room with sudden onset of nausea, vomiting, diarrhea with  admission.  In the emergency room, the patient was found to have a white  count of 19,000.  She was afebrile.  The patient was admitted to the floor,  put on IV fluids and IV antiemetics.  Stool cultures were obtained.  The  patient, the following day, had resolution of her nausea and vomiting.  Still had diarrhea.  At that time, her diet was slowly advanced.  She had  been n.p.o. overnight.  Stool cultures returned negative for C. difficile,  O&P and CNS.  The patient's white count returned to normal, on repeat 7600.  Hemoglobin dropped to 13 then 11.2.  It was 11.4 at the time of discharge.  The patient's stools were hemoccult negative.  The patient's electrolytes,  BUN and creatinine, liver function tests were all within normal range.  She  did undergo a CT of the abdomen and pelvis which showed extensive colonic  diverticulosis.  The patient continued to have diarrhea and Cipro was added  to her regimen with improvement along with cholestyramine.  At the time of  discharge, her diarrhea had resolved, and she was bleeding well.  Incidentally, her theophylline level was suboptimal at 6.   MEDICATIONS:  1.  Spiriva.  2.  Singulair.  3.  Advair.  4.  Theo-Dur.  5.  Home nebulizers.  6.  Prescription given for Cipro for two more days.   CONDITION ON DISCHARGE:   Stable.      Kirk Ruths, M.D.  Electronically Signed     WMM/MEDQ  D:  01/27/2005  T:  01/27/2005  Job:  [id number]

## 2010-10-04 NOTE — Consult Note (Signed)
NAME:  [name], [name]                     ACCOUNT NO.:  [id number]   MEDICAL RECORD NO.:  [id number]                   PATIENT TYPE:  INP   LOCATION:  3030                                 FACILITY:  [facility]   PHYSICIAN:  Marcelyn Bruins, M.D. [facility]              DATE OF BIRTH:  [birth year]   DATE OF CONSULTATION:  10/16/2002  DATE OF DISCHARGE:                                   CONSULTATION   PULMONARY CONSULTATION   HISTORY OF PRESENT ILLNESS:  The patient is a very pleasant 77 year old  black female whom I have been asked to see for preoperative pulmonary  clearance.  The patient is to have anterior cervical disk fusion at multiple  levels for spondylosis with myelopathy.  The patient has a history of asthma  dating back to her 87s.  She has been hospitalized in the past for asthma,  but has never been intubated. She has had increasing asthma symptoms,  approximately every 1 month, but they are not severe and they are easily  treated. She has no history currently to suggest bronchospasm or chest  infection.  The patient does have chronic dyspnea on exertion with making a  bed, vacuuming 1 room of her house, or drying to walk with groceries.  She  has no significant cough or secretions.   PAST MEDICAL HISTORY:  1. Past medical history is significant for asthma as stated above.  2. History of hypertension.   PAST SURGICAL HISTORY:  She is unclear whether she has had any operations in  the past.   ALLERGIES:  She has no known drug allergies.   SOCIAL HISTORY:  She has never smoked.   FAMILY HISTORY:  Family history is remarkable for her mother dying in her  75s of unknown causes.  Her father died at age 53 of heart difficulties.  She has 2 sisters who died from cancer.   REVIEW OF SYSTEMS:  As per history of present illness.   PHYSICAL EXAMINATION:  GENERAL:  She is a well-developed, white female in no  acute distress.  VITAL SIGNS:  Blood pressure is 129/58.  Pulse is 89,  respiratory rate is  18.  She is afebrile.  O2 saturation on room air is 92-95%.  HEENT:  Pupils are equal, round, and react to light and accommodation.  Extraocular muscles are intact.  Nares are patent no discharge.  Oropharynx  is clear.  NECK:  Neck is supple without JVD or lymphadenopathy.  There is no palpable  thyromegaly.  CHEST:  Reveals mild decreased breath sounds, but no wheezes or rhonchi.  There are no crackles.  CARDIAC:  Cardiac exam reveals a regular rate and rhythm with a 2/6 systolic  murmur.  ABDOMEN:  Abdomen is soft, nontender, with good bowel sounds.  RECTAL:  Exam was not done and was not indicated.  EXTREMITIES:  Lower extremities are without edema.  Pulses are diminished  but palpable and symmetrical.  There is no calf tenderness.  NEUROLOGIC:  She is alert and oriented, and does move all four extremities.   X-RAY DATA:  A chest x-ray was not located this morning and attempts will be  made to see this preoperatively.  PFTs are not able to be done because of  the urgency of the surgery.   IMPRESSION:  History of asthma with fairly good control by her history. It  is hard to know how much of her dyspnea on exertion is due to her age and  debility and how much may be due to pulmonary reserve.  The patient has no  active bronchospasm or infection currently.  She is at moderate risk for  postoperative pulmonary complications.   PLAN:  1. We will treat with nebulized Xopenex as well as Pulmicort Respules.  2. Incentive spirometry postoperatively.  3. Out of bed as early as possible postoperatively.  4. DVT prophylaxis when cleared by neurosurgery.                                               Marcelyn Bruins, M.D. [facility]    KC/MEDQ  D:  10/16/2002  T:  10/16/2002  Job:  [id number]   cc:   Corrie Mckusick, M.D.  [address] [place]urell [place]s. [place]  [place]  [place] [address]  Fax: [phone]   Genene Churn. Love, M.D.  [address]  [address]  [place]  [place] [address]  Fax:  [phone]   Danae Orleans. Venetia Maxon, M.D.  [address]  [place]  [place] [address]  Fax: [phone]

## 2010-10-04 NOTE — Discharge Summary (Signed)
[facility]  Patient:    [name], [name] Visit Number: [id number] MRN: [id number]          Service Type: MED Location: 3A A326 01 Attending Physician:  Darlin Priestly Dictated by:   Colette Ribas, M.D. Admit Date:  11/29/2001 Discharge Date: 12/02/2001                             Discharge Summary  DISCHARGE DIAGNOSIS:  Severe lumbar degenerative disk disease.  HISTORY OF PRESENT ILLNESS AND PAST MEDICAL HISTORY:  Please see admission H&P.  HOSPITAL COURSE:  An 77 year old female who came in again with abdominal pain thought to be more of a GI source of course. The abdominal pain seemed to start to improve and it started to localize more in her back. It was decided to obtain a MRI of her lumbar spine as from the CAT scan it was seen that she had severe degenerative disease. It was felt like this could be the source of her discomfort. The abdominal pelvic CT just showed significant diverticular disease without diverticulitis again. The MRI showed high-grade spinal stenosis L4-L5 and moderate to severe spinal stenosis L3-L4, significant degenerative and facet changes throughout. The patients pain became well controlled and from a respiratory standpoint she began to remain stable as well. She had some issues with hypokalemia and low sodium both of which were repleted. She felt well and was ready for discharge on December 02, 2001.  PHYSICAL EXAMINATION:  VITAL SIGNS:  T-max 98.5, blood pressure 112-150/57-74, respiratory rate 18.  GENERAL:  When I followed the patient she was a pleasant talker in no acute distress. Joking and feeling much better.  CHEST:  Clear to auscultation bilaterally.  CARDIOVASCULAR:  Regular rate and rhythm, no murmur.  ABDOMEN:  Soft, nontender, nondistended.  EXTREMITIES:  No cyanosis, clubbing. No edema.  LABORATORY DATA:  As stated from July 16th.  DISCHARGE MEDICATIONS:  Same as admission plus  Darvocet-N 100 one to two tablets q.6 h. p.r.n., Aciphex 20 mg daily, Bextra 10 mg daily.  DISCHARGE CONDITION:  Improved and stable.  FOLLOWUP:  Will be discharged with home physical therapy as well as home health nursing to follow up in one week at [facility] and will set up at that time an evaluation with a back specialist. Dictated by:   Colette Ribas, M.D. Attending Physician:  Darlin Priestly DD:  12/02/01 TD:  12/05/01 Job: [id number]

## 2010-10-04 NOTE — Procedures (Signed)
[name], [name] NO.:  [id number]   MEDICAL RECORD NO.:  [id number]          PATIENT TYPE:  INP   LOCATION:  A330                          FACILITY:  [facility]   PHYSICIAN:  Dani Gobble, MD       DATE OF BIRTH:  04/12/1913   DATE OF PROCEDURE:  05/28/2004  DATE OF DISCHARGE:                                  ECHOCARDIOGRAM   REFERRING PHYSICIAN:  Dani Gobble, MD, Kirk Ruths, M.D.   INDICATIONS:  [name] is an 77 year old female admitted with  gastroenteritis and fever, who also has a past medical history of  hypertension and diabetes mellitus, who was referred for echocardiogram to  assess for the possibility of endocarditis.   The technical quality of the study is quite limited secondary to patient's  body habitus and poor acoustic windows.   The aorta is within normal limits at 2.7 cm.  The left atrium is at the  upper limits of normal at 4.0 cm.  The patient did appear to be in sinus  rhythm during this procedure.   The intraventricular septum and posterior wall were mildly thickened with  additional basal septal hypertrophy overlay, as is common in the elderly.   The aortic valve was not well visualized but appeared to have reasonable  leaflet excursion.  No significant aortic insufficiency is noted.  Doppler  interrogation of the aortic valve is within normal limits.   The mitral valve was mildly thickened but with grossly normal leaflet  excursion.  Mild mitral annular calcification is noted.  Trace-to-mild  mitral regurgitation is noted.  Doppler interrogation of the mitral valve is  within normal limits.  No flagrant masses are noted on the mitral valve or  the aortic valve; however, the study is not adequate for assessment of  subtle findings.   The pulmonic valve is not well visualized.  The tricuspid valve appears  grossly structurally normal with estimated RVSP of approximately 30 mmHg.  Trace-to-mild tricuspid regurgitation is  noted.   The left ventricle is small in cavity size, but the LVIDD measured 3.1 cm,  and the LVISD measured 1.6 cm.  Overall, left ventricular systolic function  is quite vigorous, and no obvious regional wall motion abnormalities are  noted.  The presence of diastolic dysfunction is inferred from __________  across the mitral valve.  The right atrium and right ventricle appeared  grossly normal in size, and right ventricular systolic function appears  normal as well.  There does appear to be an anterior echo-free space  suggestive of fat pad.   IMPRESSION:  1.  Left atrium at the upper limits of normal.  2.  Mild concentric left ventricular hypertrophy with additional basal      septal hypertrophy overlay, as is common in the elderly.  3.  Aortic valve is not well visualized but appeared to have reasonable      leaflet excursion.  4.  Mildly thickened mitral valve but with normal leaflet excursion and      trace-to-mild mitral regurgitation.  5.  Trace-to-mild tricuspid regurgitation with an estimated right  ventricular systolic pressure of approximately 30 mmHg.  6.  Small left ventricular cavity size but with normal-to-vigorous left      ventricular systolic function and no regional wall motion abnormalities      are noted.  7.  The presence of diastolic dysfunction is inferred from pulse-wave      Doppler across the mitral valve.  8.  The presence of an anterior echo-free space is suggestive of fat pad.  9.  Consider transesophageal echocardiogram if clinically indicated for      enhanced visualization of the cardiac structures.      AB/MEDQ  D:  05/28/2004  T:  05/28/2004  Job:  [id number]   cc:   Kirk Ruths, M.D.  [address]  [place]  [place] [address]  Fax: [phone]   Dani Gobble, MD  Fax: [phone]

## 2010-10-04 NOTE — Consult Note (Signed)
NAME:  [name], [name]           ACCOUNT NO.:  [id number]   MEDICAL RECORD NO.:  [id number]          PATIENT TYPE:  INP   LOCATION:  A330                          FACILITY:  [facility]   PHYSICIAN:  R. Roetta Sessions, M.D. DATE OF BIRTH:  1912/07/05   DATE OF CONSULTATION:  05/28/2004  DATE OF DISCHARGE:                                   CONSULTATION   REASON FOR CONSULTATION:  Gastroenteritis, Salmonella.   HISTORY OF PRESENT ILLNESS:  The patient is an 77 year old black female who  was admitted on May 20, 2004, with nausea, vomiting and diarrhea.  Since  admission she has grown Salmonella species in both her stool and in blood  cultures.  She has been on IV Cipro 400 mg q.12h. for three days now.  The  last 24 hours she has improved and sounds much more alert, and states that  she feels much better; however, prior to this she was becoming quite weak  and lethargic.  She is unsure of how many bowel movements she has been  having a day.  According to the nurses' records, she had two stools  yesterday and two the day before.  The patient denies any diarrhea this  morning.  She has some vague mild abdominal discomfort.  There has been no  report of frank blood in the stool; however, she is heme-positive.  She has  had no further nausea or vomiting.  She ate fairly well for breakfast.  She  is unsure if she has ever had a colonoscopy.  Notably on admission her white  count was 8400.  Yesterday it was up to 24,000.  Today it is 19,300.  Her  liver function tests have been normal except albumin is 2.3.  Her creatinine  was 3.2 on admission and is now 0.9.  She has had a negative C. difficile  and O&P.  An abdominal ultrasound revealed common bile duct diameter of 11  mm with mild intrahepatic biliary dilatation.  The distal common bile duct  was not well visualized.  There was also mild dilatation of the proximal  pancreatic duct of 3 to 4 mm.  She is status post cholecystectomy.   MEDICATIONS PRIOR TO ADMISSION:  1.  Antivert 25 mg t.i.d.  2.  K-Dur 10 mEq t.i.d.  3.  Lasix.  4.  Lyrica 50 mg t.i.d.  5.  Ambien 10 mg q.h.s.  6.  Clarinex 5 mg daily.  7.  Singular 10 mg daily.  8.  Diovan 160/12.5 mg daily.  9.  Theo-dur 200 mg b.i.d.  10. Mobic 7.5 mg daily.   ALLERGIES:  No known drug allergies.   PAST MEDICAL HISTORY:  1.  Bronchial asthma.  2.  Hypertension.  3.  Osteoarthritis.  4.  Degenerative disk disease.  5.  Spinal stenosis.  6.  Diet-controlled diabetes mellitus.  7.  Diverticulosis.  8.  A recent barium esophagogram revealed diffuse esophageal dysmotility      with relative barium at the GE junction, where a 12.5 mm barium tablet      lodged, but eventually passed.   PAST SURGICAL HISTORY:  1.  Cervical fusion in May 2004.  2.  Prior cholecystectomy, with radiological studies in 2003.  3.  The patient cannot recall any other surgeries, but according to the      medical history she possibly had a laparotomy about 15 years ago for      unknown cause.   FAMILY HISTORY:  Negative for colorectal cancer, chronic GI illnesses.   SOCIAL HISTORY:  She is widowed.  She has 9 living children.  One daughter  is deceased secondary to breast cancer in her 45's.  Denies any tobacco or  alcohol use.   REVIEW OF SYSTEMS:  See the HPI for GI, cardiopulmonary.  Denies any chest  pain or shortness of breath.  GENITOURINARY:  Denies any dysuria.  A Foley  catheter is in place.   PHYSICAL EXAMINATION:  VITAL SIGNS:  Weight is 131 pounds, height 62 inches,  temperature 98.5 degrees, pulse 79, respirations 20, blood pressure 127/67.  GENERAL:  A pleasant black female, in no acute distress.  She is alert.  Her  son is at the bedside.  SKIN:  Warm and dry, no jaundice.  HEENT:  Conjunctivae pink.  Sclerae anicteric.  Oropharyngeal mucosa moist  and pink.  No lesions, erythema, or exudate.  No lymphadenopathy or  thyromegaly.  CHEST:  Lungs are clear to  auscultation.  HEART:  Reveals distant heart sounds, a regular rate and rhythm.  ABDOMEN:  Positive bowel sounds, soft, not distended.  She has mild diffuse  abdominal tenderness to deep palpation.  No organomegaly or masses  appreciated.  EXTREMITIES:  No edema.   LABORATORY DATA:  Today white count 19,300, hemoglobin 10.5, hematocrit  30.1, platelets 251,000.  Sodium 150, potassium 3.7, BUN 28, creatinine 0.9,  glucose 130.  Total bilirubin 0.7, alkaline phosphatase 57, AST 35, ALT 19,  albumin 2.3.  Urinalysis yesterday revealed small blood, 30 protein,  positive nitrites, negative leukocyte esterase, 7-10 WBCs.   IMPRESSION:  1.  The patient is an 77 year old lady who presents with nausea, vomiting      and diarrhea.  She has grown Salmonella in both blood and stool.  Over      the last 24 hours she has shown improvement.  She has been on      intravenous Cipro now for three days.  She has had no bowel movement as      of yet today.  2.  Dilated bile duct and pancreatic duct noted on an abdominal ultrasound.      Liver function tests are normal.  She is status post cholecystectomy.      Unclear significance at this time.   RECOMMENDATIONS:  1.  Would recommend changing Cipro to oral tomorrow.  She needs to complete      a 10-day-course at 500 mg p.o.      b.i.d.  In six weeks would repeat stool culture, to verify eradication.  2.  Will discuss further with Dr. Jonathon Bellows.  May consider a CT to      further evaluate the bile duct and pancreas.     Lesl   LL/MEDQ  D:  05/28/2004  T:  05/28/2004  Job:  [id number]   cc:   Kirk Ruths, M.D.  [address]  [place]  [place] [address]  Fax: [phone]

## 2010-10-04 NOTE — Discharge Summary (Signed)
NAME:  [name], [name]                     ACCOUNT NO.:  [id number]   MEDICAL RECORD NO.:  [id number]                   PATIENT TYPE:  INP   LOCATION:  A201                                 FACILITY:  [facility]   PHYSICIAN:  Patrica Duel, M.D.                 DATE OF BIRTH:  07/12/1912   DATE OF ADMISSION:  11/16/2003  DATE OF DISCHARGE:  11/21/2003                                 DISCHARGE SUMMARY   DISCHARGE DIAGNOSES:  1. Acute radiculopathy of the left L4,5 distribution.  2. Documented marked spinal stenosis L3,4, spinal stenosis L4,5 with     foraminal impingement of the left L5 nerve root and marked moderate L5-S1     neuroforaminal stenosis with flattening of both L5 nerve roots within the     foramen.  3. History of surgical intervention for severe cervical disk disease.  4. History of chronic obstructive pulmonary disease.  5. Hypertension.  6. Diabetes mellitus, type 2.  Diet control only.   For details regarding admission, please refer to the admit note.   Briefly, this 77 year old female with the above history developed weakness  of her left lower extremity and nearly fell.  The day prior she had been  able to walk.  Workup in the emergency department was normal except for  white count of 14,000 (probable steroid effect).  Chemistries were  unremarkable except for glucose of 167, D-dimer, BNP were within normal  limits.  Cardiac enzymes were also normal. Theophylline level was 15.5.  CT  scan of the head was obtained which was negative for acute insult.   The patient was admitted for acute onset of weakness and left lower  extremity and inability to walk.  My initial impression was that the patient  had acute myelopathy or radiculopathy secondary to spinal disease.   HOSPITAL COURSE:  The patient was treated with steroids and analgesia.  She  gradually improved.  The patient was followed by physical therapy.  She soon  became ambulatory and her neurologic status  improved.  Dr. Gerilyn Pilgrim saw the  patient.  Workup for acute stroke was benign.  Dr. Jeral Fruit was consulted over  the telephone.  Given the improvement of her symptoms, surgical intervention  was not considered at this time.  He felt that an outpatient course of  epidural steroids as well as p.o. steroids would be appropriate.  She was  discharged on the sixth hospital day in much improved condition.   DISPOSITION:  Steroid prednisone dose pack. She will continue her  medications which she had previously been taking which include:  1. Singulair 10 daily.  2. Mobic 7.5 daily.  3. Clarinex 5 daily.  4. Diovan 160/12.5 one daily.  5. Ambien 10 mg q.h.s.  6. Theo-Dur 200 mg p.o. b.i.d.   She will be followed and treated as an outpatient.     ___________________________________________  Patrica Duel, M.D.   MC/MEDQ  D:  12/04/2003  T:  12/04/2003  Job:  [id number]

## 2010-10-04 NOTE — H&P (Signed)
NAME:  [name], [name]                     ACCOUNT NO.:  [id number]   MEDICAL RECORD NO.:  [id number]                   PATIENT TYPE:  INP   LOCATION:  A201                                 FACILITY:  [facility]   PHYSICIAN:  Patrica Duel, M.D.                 DATE OF BIRTH:  02/19/1913   DATE OF ADMISSION:  11/16/2003  DATE OF DISCHARGE:                                HISTORY & PHYSICAL   CHIEF COMPLAINT:  Leg weakness and inability to walk.   HISTORY OF PRESENT ILLNESS:  This is an 77 year old female with a relatively  complicated past history.  She has been treated chronically for chronic  obstructive pulmonary disease, hypertension, as well as diabetes mellitus  type 2, diet control only.  She has a history of apparent laminectomy of the  cervical spine approximately one or two years ago.  This was performed at  [facility].   The patient was seen in the office approximately two weeks ago with an  episode of asthmatic bronchitis.  She has done well with prescribed  antibiotics and prednisone tapering dose.   Today, the patient arose from bed and walked to the bedroom to wash her  face.  She was in her normal state of health when suddenly she developed  weakness in her left lower-extremity and nearly fell.  She was able to sit  down without significant trauma.  She was brought to the emergency room for  further evaluation.  A workup there included a CBC which was normal except  for a white count of 14,000 (probable steroid effect).  Chemistries were  unremarkable except for a glucose of 167.  D-Dimer, BNP were within normal  limits.  Cardiac enzymes were also normal.  The theophylline level was 15.5.   A CT of the head was obtained which was negative for acute insult.   The patient was admitted for acute onset of weakness in the left lower-  extremity and inability to walk.   There is no history of headache or neurologic deficits other than those  noted.  Recent head trauma,  nausea, vomiting, diarrhea, melena, hematemesis,  hematochezia, chest pain.  She does have some chronic shortness of breath  with no significant change.   CURRENT MEDICATIONS:  1. Singulair 10 daily.  2. Mobic 7.5 daily.  3. Clarinex 5 daily.  4. Diovan 160/ 12.5 one daily.  5. Ambien 10 mg q.h.s.  6. Prednisone 5 daily, (recent taper begun).  7. Theo-Dur 200 mg b.i.d.   ALLERGIES:  None known.   SOCIAL HISTORY:  Nonsmoker, nondrinker.  She is cared for by her son.   PAST MEDICAL HISTORY:  As noted above.  She also has a history of  diverticulitis.   REVIEW OF SYSTEMS:  Negative except as mentioned.   FAMILY HISTORY:  Noncontributory.   ALLERGIES:  None known.   PHYSICAL EXAMINATION:  GENERAL:  A very-pleasant, fully-alert female who is  in no acute distress.  VITAL SIGNS:  On presentation, temperature 97.6, blood pressure 145/60,  pulse 110, respirations 28.  HEENT:  Normocephalic, atraumatic.  Pupils were  equal.  Ears, nose and throat are benign.  There is no facial asymmetry.  NECK:  Supple, no bruits, thyromegaly or lymphadenopathy.  LUNGS:  Essentially clear.  HEART:  Sounds are normal without murmurs, rubs or gallops.  ABDOMEN:  Nontender, nondistended.  Bowel sounds are intact.  EXTREMITIES:  No cyanosis, clubbing or edema.  PULSES:  The peripheral pulses are intact.  NEUROLOGIC:  Reveals generalized weakness of the left lower-extremity with  intact sensation as well as intact reflexes.  Strength is 2-3/5.   ASSESSMENT:  Acute lower-extremity weakness in an elderly female with a  history of neck surgery as noted.  The patient states this is very similar  to the symptoms she experienced before her neck was operated on.  No  evidence of acute cerebrovascular accident at this time.  We will strongly  consider acute myelopathy or radiculopathy as the cause of her symptoms.   PLAN:  1. We will administer steroids and analgesia.  2. We will obtain an MRI as well as  the lumbar spine, and possibly of the     brain as well.  3. Neurology consult and possibly neurosurgical transfer pending progress.  4. We will follow with you expectantly.     ___________________________________________                                         Patrica Duel, M.D.   MC/MEDQ  D:  11/16/2003  T:  11/16/2003  Job:  [id number]

## 2010-10-04 NOTE — Consult Note (Signed)
NAME:  [name], [name]                     ACCOUNT NO.:  [id number]   MEDICAL RECORD NO.:  [id number]                   PATIENT TYPE:  INP   LOCATION:  A201                                 FACILITY:  [facility]   PHYSICIAN:  Kofi A. Gerilyn Pilgrim, M.D.              DATE OF BIRTH:  January 06, 1913   DATE OF CONSULTATION:  DATE OF DISCHARGE:                                   CONSULTATION   CONSULTING PHYSICIAN:  Kofi A. Gerilyn Pilgrim, M.D.   REASON FOR CONSULTATION:  Left leg weakness.   IMPRESSION:  The clinical picture and leg examination seem most consistent  with a cerebrovascular event otherwise it is relatively difficult to sort  out, given her multiple problems with degenerative disk disease.  Other  possibilities include lumbar sacral radiculopathy on the left due to severe  spinal stenosis.  She does not appear particularly to be myelopathic on  examination, so I do not believe this is causing a significant problem at  this time.  In addition, her MRI was reviewed with the radiologist and it  appears not to be evidence of clear spinal cord compression.   RECOMMENDATIONS:  1. I would obtain an MRI of the brain.  2. May also want to consider bumping up her dose of steroids to see if she     has improvement in weakness in the left leg.   HISTORY:  This is an 77 year old African American female who presented with  similar complaints about a year ago; however, the presentation was more  insidious in onset and not as acute as the current presentation.  The  patient, at that time, had neck pain, numbness and weakness of the upper  extremities particularly involving the left upper extremity.  She also had  numbness and tingling of the legs and inability to ambulate.  Imaging of the  brain apparently showed spinal cord compression and multiple herniated  disks.  She underwent a complex multi-level laminectomy and fusion.  She was  subsequently transferred to rehab where with aggressive therapy she  was able  to ambulate again.  On the day of admission, she appeared to have an acute  onset of leg weakness.  She reports standing up and her left leg giving out.  It appears that she may have had some problems with tingling and burning in  the hands and feet which has been present for a couple of weeks or so.  She  also endorses left upper extremity weakness, although can not ascertain from  her how long that has been present.   PAST MEDICAL HISTORY:  1. Degenerative disk disease with spinal cord compression.  2. Asthma, apparently is on chronic oxygen and steroid medications for this.  3. Hypertension.   ADMISSION MEDICATIONS:  Singulair, Mobic, Claritin, Diovan 160/12.5 daily,  Ambien 10 mg daily, prednisone 5 mg daily, and Theo-Dur.   ALLERGIES:  None.   SOCIAL HISTORY:  Nonsmoker.  No present alcohol  or illicit drug use.  She is  cared for by her son.   REVIEW OF SYSTEMS:  As stated in the present illness, otherwise  unremarkable.   PHYSICAL EXAMINATION:  GENERAL:  This was a pleasant thin lady.  She has  oxygen and appears to be in mild discomfort.  She appears mildly dyspneic.  VITAL SIGNS:  Temperature is 97.1, pulse 78, respirations 18, blood pressure  121/69.  NECK:  Supple.  LUNGS:  Currently clear to auscultation bilaterally.  CARDIOVASCULAR:  Revealed a normal S1 and S2.  ABDOMEN:  Soft.  EXTREMITIES:  No edema.  NEUROLOGIC:  She is awake and alert.  She converses fluently, coherently.  No dysarthria or dysphasia.  __________  evaluation showed pupils are in mid  position and reactive.  She has a dense cataracts bilaterally, particularly  on the right side.  Visual fields are intact.  Extraocular movements are  full.  Facial muscle strength is normal.  Tongue is midline.  Uvula is  midline.  Shoulder shrug is normal.  Motor examination shows weakness of the  proximal muscles of the left upper extremity, particularly the deltoids and  triceps.  Distal muscles are  normal.  Biceps are normal.  The left lower  extremity again shows weakness in the proximal muscles about 4-5 weakness  and hip flexion.  Distal muscles are normal.  The weakness of the deltoids  and triceps on the left side is 4-5.  The right side shows normal tone,  bulk, and strength.  Reflexes are slightly brisk in the legs.  The right toe  goes up, the left is equivocal.  Reflexes are essentially normal in the  upper extremities.  They may be slightly brisk at the triceps.  Sensory  examination shows no sensory levels, essentially was normal from side-to-  side to temperature, light touch, and pinprick.   The MRI of the cervical and thoracic spine were reviewed in person with the  radiologist.  There appears to be extensive fusion from C3-C4 to C7.  She  does appear to have some foraminal stenosis on the left C4-C5.  There is  also increasing __________  C4-C5, left greater than right, and also C5-C6.  The lumbar spine:  There is severe stenosis with hypertrophy of the facettes  bilaterally and ligament stable hypertrophy at L5-S1.  There is also marked  spinal stenosis at L4-L5.  There is some stenosis noted at L3-L4.  There is  bilateral foraminal flattening at the L5-S1 and also some at the left L4  nerve root.   Thanks for this consultation.      ___________________________________________                                            Perlie Gold Gerilyn Pilgrim, M.D.   KAD/MEDQ  D:  11/17/2003  T:  11/17/2003  Job:  [id number]

## 2010-10-04 NOTE — Discharge Summary (Signed)
NAME:  [name], [name]                     ACCOUNT NO.:  [id number]   MEDICAL RECORD NO.:  [id number]                   PATIENT TYPE:  INP   LOCATION:  3037                                 FACILITY:  [facility]   PHYSICIAN:  Genene Churn. Love, M.D.                 DATE OF BIRTH:  10/25/1912   DATE OF ADMISSION:  10/13/2002  DATE OF DISCHARGE:  10/24/2002                                 DISCHARGE SUMMARY   REASON FOR ADMISSION:  This was the first hospital admission for this 77-  year-old right-handed black married female from [place], [place],  admitted from the office, Oct 13, 2002, for evaluation of numbness and gait  disorder.   HISTORY OF PRESENT ILLNESS:  [name] states that she awoke on Saturday  morning prior to admission with numbness in her hands and feet, difficulty  walking, and was seen at the [facility] Emergency Room.  There, a  CT scan of the brain was negative.  She was referred for neurologic  evaluation.  She has a two-month history of vague pain occurring in her  neck, radiating to her arms; she has had a history of bladder incontinence  but no bowel incontinence; she has a history of low back pain treated with  epidural steroids, with high-grade spinal stenosis at 4-5 and moderate-to-  severe spinal stenosis at the L3-4 level.  She has had a past history of  hypertension, asthma, diverticulitis, and lumbar spinal stenosis.   MEDICATIONS:  Her medications were unknown by the patient and son when she  came to the office, but she has been on:  1. Advair 250/50 mcg one puff q.i.d.  2. Dicyclomine 10 mg daily.  3. Diovan 160/12.5 daily.  4. Nasonex p.r.n.  5. Singulair 10 mg p.o. q.h.s.  6. Bextra 10 mg daily.  7. Theophylline 200 mg SA one p.o. b.i.d.  8. Meclizine 25 mg one tablet t.i.d. p.r.n.  9. Zenapax twice daily.  10.      Albuterol every six hours -- nebulizers and inhaler.   SOCIAL HISTORY:  She was educated through the 7th grade; she  can read and  write.   PHYSICAL EXAMINATION:  VITAL SIGNS:  Examination when she was admitted  revealed a blood pressure in right and left arms of 160/80.  Heart rate was  96 and regular.  NECK:  She had restricted neck motion.  NEUROLOGIC:  She was alert, thought that it was 1904.  She had a  buccolingual dyskinesia.  Her pupils reacted from 5 to 3 bilaterally.  Disks  were flat.  Spontaneous venous pulsations were seen.  Tongue was midline.  The uvula was midline.  Gags were present.  Motor examination revealed 4/5  strength in the upper extremities and 4+/5 strength in the lower extremities  with decreased pinprick to T8, decreased vibration to T12, decreased joint  position in her feet, decreased pinprick in her  arms and legs.  She had no  reflexes except 2+ triceps jerks and knee jerks.  When she was admitted, the  right plantar response was upgoing and the left plantar response was  downgoing.  GENERAL:  Her general examination was relatively unremarkable.  LUNGS:  She did have a few scattered wheezes.   LABORATORY DATA:  White blood cell count was 11,900, hemoglobin 13.6,  hematocrit 40.0, platelets 299,000.  Sodium 145, potassium 3.9, chloride  109, CO2 content 28, BUN 15, creatinine 0.9, calcium 10.2, total protein  7.1, albumin 4.0, AST 23, ALT 11, ALP 52, total bilirubin 0.5.  Repeat CBC  in the hospital revealed a white blood cell count of 15,100 while on  steroids for her surgery, hemoglobin of 10.1, hematocrit 29.0.  Repeat  sodium 143, potassium 4.3, chloride 106, CO2 content 32, glucose 118, total  protein 5.4, albumin 2.9.  Liver function tests repeated, normal.  CKs of  337, CK/MB of 10.8 and 236, CK/MB 9.8 and 237, CK/MB 8.2 and CK of 100, CK-  MB 7.9.  B12 438.  All of the elevated CKs were associated with an elevated  troponin level.  Per cardiology, CK showed poor interpretation, may be  adversely affected.  Normal sinus rhythm with occasional PVCs and   supraventricular complexes.   Her renal ultrasound was normal.  Two-view of the chest x-ray showed no  active disease.  Thoracic spine x-ray showed no evidence of osseous lesions  or significant disk pathology.  MRI of the thoracic spine showed a negative  MRI of the thoracic spine, no evidence of stenosis or cord lesion and  shallow protrusions at T9-10 and T10-11.  Cervical radiographs showed  multilevel degenerative disk disease with straightening and slight kyphosis  of the cervical spine with prominent osteophytes, likely to result in spinal  stenosis from C3-4 through C6-7.  MRI of the cervical spine showed advanced  degenerative disease at C3-4 with posteriorly projecting osteophytes and  protruding disk material.  The spinal canal was narrowed but the cord did  not appear to be grossly compressed at that level.  At the C4-5 level, there  was anterolisthesis of 4 on 5 by 2 to 3 mm.  There were large posteriorly-  projecting osteophytes covering protruding disk.  The posterior elements  were hypertrophic.  There was severe spinal stenosis with diameter of the  cord about 6 to 7 mm.  Subarachnoid space around the cord was effaced and  the cord was indented.  There was an abnormal T2 signal in the cord at that  level.  At 5-6, there was degenerative disk disease with projecting  osteophytes and the canal was narrowed and an indentation of the cord was  noted.  At 6-7, there was spondylosis and posteriorly projecting osteophytes  covering protruding disk material.  The ventral subarachnoid space was  indented.   Intraoperative myelography demonstrated a needle at the anterior approach  directed towards the C3-4 space to help with surgical localization.  Film at  1210 hours demonstrated anterior fixation of hardware extending from C4 to  at least C6-7 and possibly C7.  Inferiorly, the hardware was not well-  visualized.   Urine culture was negative.  Urinalysis was  unremarkable.  Chest x-ray showed no active disease.   HOSPITAL COURSE:  The patient was admitted and found to have severe cervical  spondylosis at C4-5, greater than C5-6, with narrowing of the canal at that  level and intra-cord signal at the 4-5 level.  MRI of the thoracic region  was thought to be relatively normal.  It was felt that the patient most  likely had a myelopathy secondary to her cervical cord disease.  She had  elevated CKs and troponins when she came into the hospital, as well as  asthmatic symptoms, and was seen by the pulmonary service.  It was felt that  most of her symptoms were secondary to asthma.  It was felt that she was  improved for preop clearance and she underwent an HNP, spondylosis, DDD,  stenosis and cervical myelopathy evaluation with surgery and anterior  diskectomy and fusion at C4-5, C5-6 and C6-7 with allograft and plate on Oct 17, 2002.  In the hospital, she had significant improvement in her weakness  and became essentially 5/5 in her lower extremities with bilateral upgoing  plantar responses and 5/5 in the right arm, with at least 4+/5 in the left  arm and some weakness in the triceps.  She had absent ankle jerks and a  decrease in the left biceps reflex and upgoing plantar responses, but other  reflexes appear to be intact.  She was able to be mobilized by physical  therapy and did quite well.  She did not complain of any chest pain in the  hospital.  Her medications were adjusted by Dr. Marcelyn Bruins.  Increased  white blood cell count was thought to represent steroids in the hospital.  Review of CKs was 337, 236, 237, 200 with CK-MBs of 10.8, 9.8, 8.2, 7.2 and  indices which were elevated, but troponins did not show evidence of  myocardial injury.   IMPRESSION:  1. Cervical myelopathy secondary to cervical spondylosis, code 721.41.  2. Gait disorder secondary to #1, code 781.2.  3. Mild dementia, code 780.93.  4. Asthma, code 493.90.  5.  Hypertension, code 796.2.  6. History of diverticulitis.   PLAN AND DISCHARGE MEDICATIONS:  Plan at this time is to discharge the  patient on:  1. Aspirin 325 mg p.o. daily.  2. Claritin 10 mg p.o. daily.  3. Colace 100 mg p.o. b.i.d.  4. Ambien 10 mg p.o. q.h.s. p.r.n.  5. Bentyl 10 mg p.o. q.6h. p.r.n.  6. Percocet one to two tabs orally every four hours p.r.n.  7. Valium 5 mg q.6h. p.r.n.  8. Xopenex 0.63 mg nebulizer q.6h.  9. Pulmicort 0.25 mg nebulizer q.6h.   CONDITION ON DISCHARGE:  She is discharged improved from her pre-hospital  status, using a walker, and will continue to require physical therapy at  this time.                                               Genene Churn. Sandria Manly, M.D.    JML/MEDQ  D:  10/23/2002  T:  10/23/2002  Job:  [id number]   cc:   Danae Orleans. Venetia Maxon, M.D.  [address]  [place] [address]  Fax: [phone]   Marcelyn Bruins, M.D. [facility]   Corrie Mckusick, M.D.  [address] Laurell Josephs. A  [place]  [place] [address]  Fax: [phone]

## 2010-10-04 NOTE — H&P (Signed)
[facility]  Patient:    CAMRIN, LAPRE Visit Number: [id number] MRN: [id number]          Service Type: MED Location: 3A A326 01 Attending Physician:  Herbert Seta Dictated by:   Isidor Holts, M.D. Admit Date:  11/29/2001   CC:         Colette Ribas, M.D. (Fax)   History and Physical  PRIMARY PHYSICIAN:  Dr. Colette Ribas.  CHIEF COMPLAINT:  Abdominal pain for two days.  HISTORY OF PRESENT ILLNESS:  [age]-year-old African American female, poor historian, history mainly obtained from the daughter.  The patient had apparently been discharged from [facility] on November 26, 2001 after admission and workup for abdominal pain, apparently discharged in satisfactory condition.  Working diagnosis at that time was acute diverticulitis.  However, since the evening on November 28, 2001, she had been complaining of abdominal pain and demanding that the daughter call the emergency medical services.  Her daughter eventually brought her to the emergency room at [facility] She denies vomiting or diarrhea, denies fever.  PAST MEDICAL HISTORY: 1. Diverticular disease, status post laparotomy 10 years ago. 2. Hypertension. 3. Bronchial asthma. 4. Osteoarthritis.  MEDICATIONS: 1. Theophylline 200 mg p.o. b.i.d. 2. Dicyclomine 10 mg p.o. q.6h. 3. Flagyl 500 mg p.o. q.i.d. 4. Ciprofloxacin 500 mg p.o. b.i.d. 5. Vicodin p.r.n.  ALLERGIES:  No known drug allergies.  SOCIAL HISTORY:  Lives with daughter, nonsmoker, does not drink any alcohol, otherwise, noncontributory.  PHYSICAL EXAMINATION:  VITALS:  Temperature 97.9, pulse 98, respirations 24, BP 192/96.   GENERAL:  She was lying in bed in no apparent acute distress, had frequent hiccups and appeared quite anxious.  HEENT:  No clinical pallor, no jaundice, no conjunctival injection.  NECK:  Supple.  There was no palpable lymphadenopathy, no palpable goiter. JVP  was not seen.  CHEST:  Clinically clear, no wheezes, no crackles.  ABDOMEN:  Abdomen was full, soft, mildly tender in the left lower quadrant. There was no palpable organomegaly.  There were normal bowel sounds.  EXTREMITIES:  Lower extremity examination showed minimal edema.  INVESTIGATIONS:  Chemistry:  Sodium 127, potassium 3.5, chloride 96, carbonate 29, BUN 29, creatinine 0.8, glucose 125.  CBC:  WBC 10.6, hemoglobin 12.6, hematocrit 36.8, platelets 355,000.  ASSESSMENT AND PLAN: 1. Likely incompletely resolved diverticulitis.  This patient has received a    very thorough and exhaustive battery of tests since her previous admission    on November 16, 2001, therefore, it would be somewhat pointless to repeat all    these investigations, however, it is felt that she will benefit for an    abdominal CT scan with contrast; we have ordered this accordingly.  In the    interim, she has been admitted to the general medical floor, commenced on    intravenous fluids and intravenous Flagyl and Rocephin. 2. Hypertension, uncontrolled.  We will commence Accupril 20 mg p.o. q.d. and    monitor. 3. Bronchial asthma, stable.  P.r.n. nebulizer treatment has been ordered for    shortness of breath and wheeze. 4. Further management will depend on clinical course. Dictated by:   Isidor Holts, M.D. Attending Physician:  Herbert Seta DD:  11/29/01 TD:  12/01/01 Job: [id number]

## 2010-10-04 NOTE — Consult Note (Signed)
[facility]  Patient:    [name], [name] Visit Number: [id number] MRN: [id number]          Service Type: MED Location: 3A A314 01 Attending Physician:  Darlin Priestly Dictated by:   Lionel December, M.D. Proc. Date: 11/22/01 Admit Date:  11/22/2001   CC:         Third Floor   Consultation Report  REASON FOR CONSULTATION:  Recurrent LLQ abdominal pain.  HISTORY OF PRESENT ILLNESS:  The patient is an 77 year old African-American female who was admitted to Dr. Beatrice Lecher service for further evaluation and treatment of LLQ abdominal pain.  The patient states she has had this pain for several weeks.  Over the last few days the pain has been increasing in intensity.  She was seen by Dr. Phillips Odor today.  Noted to be very tender in left lower quadrant and therefore hospitalized.  She has been evaluated for this pain before.  Last when she had abdominopelvic CT which did not shed any light as to the cause of this pain.  According to her daughter, she has been treated with antibiotics presumably for diverticulitis but failed to improve. Her recent CT had shown diverticulosis.  It also showed uterine calcification felt to be calcified fibroid.  The patient states this pain lasts no more than a few minutes to an hour.  This pain is not associated with nausea, vomiting, fever but today she has had chills.  The pain radiates into her left lower extremity as well as posterolaterally into her flank.  However, she has not experienced any dysuria/hematuria.  She also denies vaginal discharge or bleeding.  She states her bowels move daily.  Although the consistency may vary she has not passed any fresh blood or tarry stools.  She also denies having problems with constipation or diarrhea.  She has noted some drop in her appetite and last month she has lost 5 pounds.  She has infrequent heartburn. She gets dyspnea if she walks at a brisk pace.  She states as long as  she walks at her own pace she does not have any problems at home.  She has bronchial asthma.  The patient is scheduled to undergo another abdominopelvic CT today which is scheduled for this evening.  She is finishing drinking contrast.  MEDICATIONS:  She is presently on Cipro and Flagyl IV, albuterol/Atrovent neb q.i.d., Advair 250/50 inhaler b.i.d., Theo-Dur 200 mg b.i.d., Sular 20 mg daily, Celebrex 20 mg daily.  At home she was on Diovan/HCTZ 100/12.5 daily and Bentyl 10 mg q.6.  She was also on Clarinex.  At home she is on twice the dose of Celebrex but has not experienced any side effects.  PAST MEDICAL HISTORY:  She has bronchial asthma, hypertension, and arthritis involving her back, hip joints, and knees.  She has had laparotomy 10 or 11 years ago but she nor her daughter do not remember the specifics.  ALLERGIES:  NK.  FAMILY HISTORY:  Noncontributory.  SOCIAL HISTORY:  She is a widow.  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  Her husband died 7 years ago.  She has nine children living; three are deceased, one daughter died of breast carcinoma in her late 11s.  PHYSICAL EXAMINATION:  GENERAL:  Pleasant, well-developed, well-nourished, African-American female who appears younger than stated age.  She appears to be in some distress.  She weighs 138.1 pounds.  She is 5 feet 2 inches tall.  VITAL SIGNS:  Pulse 105 per minute,  blood pressure 156/102, respiratory rate is 20 and temperature is 96.9.  HEENT:  Conjunctiva are pink.  Sclerae are nonicteric.  Oral mucosa is normal.  NECK:  Without masses or thyromegaly.  CARDIAC:  Regular rhythm, normal S1 and S3, no murmur, rub, or gallop noted. LUNGS:  Auscultation reveals diminished intensity of breath sounds bilaterally and few expiratory rhonchi noted at both bases.  ABDOMEN:  Protuberant.  She has lower midline scar.  Bowel sounds are normal. No bruits noted.  Palpation reveals a soft abdomen with moderate tenderness  in LLQ with guarding but no rebound.  No organomegaly or masses.  RECTAL:  Soft central skin tags.  Digital exam reveals soft stool in the vault which is guaiac negative.  EXTREMITIES:  No clubbing or edema noted.  LABORATORIES FROM THIS ADMISSION:  WBCs 10.8, H&H are 13.4 and 39.2, platelet count is 328.  She had 77 segs but 0 bands.  Sodium was 138, potassium 4.1, chloride 101, CO2 was 33, glucose 116, BUN 17, creatinine 0.9, bilirubin 0.4. AP 75, AST 24, ALT 25, total protein 7.1 with albumin of 3.9, calcium is 9.5.  Urinalysis is unremarkable.  Serum lipase is 21.  Abdominal CT from last month reviewed.  It shows sigmoid colon diverticulosis thickening to bulbar area possibly due to poor distention.  However, it also shows a rounded density with a very thin hypodensity surrounding it in the sigmoid colon.  ASSESSMENT:  The patient is an 77 year old female with few-week history of left lower quadrant abdominal pain which has been intermittent and was not responding to antibiotic therapy.  Her recent CT is suspicious for colonic intussusception although the radiographic findings are not classical.  Her clinical course would not favor a diagnosis of diverticulitis.  Furthermore, no changes of diverticulitis were seen on previous CT.  She could also have benign colonic stricture.  RECOMMENDATIONS:  I agree with treating her empirically with Cipro and Flagyl. I have taken the liberty of changing the dose of Cipro to 400 mg IV q.12h.  We will discontinue her Celebrex and use morphine sulfate IV for pain control.  Abdominopelvic CT will be reviewed when it is completed.  If this study is nondiagnostic would consider single contrast enema possibly with Gastrografin. If all of these studies are negative, she will need total colonoscopy.  I would like to thank Dr. Phillips Odor for sharing care of this nice lady with [place]. Dictated by:   Lionel December, M.D. Attending Physician:  Darlin Priestly  DD:  11/22/01 TD:  11/25/01 Job: [id number]

## 2010-10-04 NOTE — Discharge Summary (Signed)
[name], [name]           ACCOUNT NO.:  [id number]   MEDICAL RECORD NO.:  [id number]          PATIENT TYPE:  INP   LOCATION:  A330                          FACILITY:  [facility]   PHYSICIAN:  Kirk Ruths, M.D.DATE OF BIRTH:  1912/07/25   DATE OF ADMISSION:  05/20/2004  DATE OF DISCHARGE:  01/12/2006LH                                 DISCHARGE SUMMARY   DISCHARGE DIAGNOSES:  1.  Salmonella sepsis.  2.  Gastroenteritis secondary to salmonella.  3.  Chronic obstructive pulmonary disease.  4.  Hypernatremia.  5.  Hypokalemia.  6.  Failure to thrive.   HOSPITAL COURSE:  This 77 year old female was admitted with several day  history of nausea and vomiting and diarrhea. The patient was admitted to the  floor and begun on IV fluids, treated symptomatically. Nausea and vomiting  resolved. She continued to have diarrhea. Subsequent cultures showed  salmonella positive blood culture, salmonella in her stool. Clostridium  difficile was negative. The patient was treated with Cipro IV initially and  subsequently changed to p.o. Cipro. Her fever defervesced as did her  diarrhea, but patient continued to be extremely weak and appeared to have  failure to thrive. Lungs remained clear throughout her stay. The patient was  seen in consultation by Dr. Jena Gauss. Basically agreed with treatment. About  that time, the patient started feeling better. Cipro was changed to p.o. The  patient also underwent a CT scan of the abdomen which was essentially  unremarkable. The patient also underwent echocardiogram to rule out  bacterial endocarditis and has yet to have been reported. The patient had  been undergoing physical therapy and still is extremely weak, but she is  discharged to home. She has some home care. The patient will be followed in  the office as needed.     Will   WMM/MEDQ  D:  05/30/2004  T:  05/30/2004  Job:  [id number]

## 2010-10-04 NOTE — Op Note (Signed)
[name], [name]           ACCOUNT NO.:  [id number]   MEDICAL RECORD NO.:  [id number]          PATIENT TYPE:  AMB   LOCATION:  DAY                           FACILITY:  [facility]   PHYSICIAN:  Lionel December, M.D.    DATE OF BIRTH:  1913-04-05   DATE OF PROCEDURE:  02/12/2005  DATE OF DISCHARGE:                                 OPERATIVE REPORT   PROCEDURES:  Esophagogastroduodenoscopy with esophageal dilation followed by  colonoscopy.   INDICATION:  Ms. Newhard is a 77 year old African American female who  presents with intermittent solid food dysphagia but without heartburn.  She  was recently hospitalized with gastroenteritis and had a drop in her H&H.  However, there was no evidence of bleeding. Dr. Fatima Blank therefore  requested a colonoscopy to show that she does not have occult neoplasm.  Family history is negative for colorectal carcinoma.   Procedure and risks were reviewed with the patient and informed consent was  obtained.   PREOPERATIVE MEDICATION:  Cetacaine spray for pharyngeal topical.   ANESTHESIA:  Demerol 20 mg IV, Versed 2 mg IV.   DESCRIPTION OF PROCEDURE:  The procedure was performed in the endoscopy  suite.  The patient's vital signs and O2 sat were monitored during procedure  and remained stable.   1.  Esophagogastroduodenoscopy. The patient was placed in the left lateral      position and the Olympus videoscope was passed oropharynx without any      difficulty into esophagus.   Esophagus:  The mucosa of the esophagus was normal.  The GE junction was at  35 cm, where she had a high-grade Schatzki's ring.  Hiatus was at 38-39.   Stomach:  The stomach was empty and distended very well with insufflation.  Folds proximal stomach were normal.  Most of mucosa was coated with bile but  there was no underlying erosions or ulcers.  The pyloric channel was  deformed but patent.  No erosions or ulcers noted.  Angularis, fundus and  cardia were examined by  retroflexing the scope and were normal.   Duodenum:  The bulbar mucosa was normal.  The scope was advanced into the  second part of duodenum.  The mucosa and folds were normal.  Endoscope was  withdrawn.   Esophagus was dilated by passing a 54-French Maloney dilator to full  insertion.  The dilators withdrawn.  Endoscope was passed again and ring was  noted to be actively disrupted.  There was some oozing but no brisk  bleeding.  Endoscope was withdrawn. The patient prepared for procedure #2.   1.  Colonoscopy.  Rectal examination performed.  No abnormality noted on      external or digital exam.  The Olympus videoscope was placed in the      rectum and advanced under vision into the sigmoid colon and beyond.  She      had multiple diverticula scattered throughout the colon but most of      these were at sigmoid and descending colon.  The prep was satisfactory      to fair.  She still has some thick liquid  stool scattered in the colon.      The scope was passed to the cecum which was identified by appendiceal      orifice and ileocecal valve.  Pictures taken for the record.  As the      scope was withdrawn colonic mucosa was examined for the second time and      there no polyps or tumor masses.  Rectal mucosa was normal.  The scope      was retroflexed to examine anorectal junction which was unremarkable.      Endoscope was straightened and withdrawn.  The patient tolerated the      procedure well.   FINAL DIAGNOSES:  1.  High grade Schatzki's ring which was disrupted by passing 54-French      Maloney dilator.  2.  Small sliding hiatal hernia.  3.  Deformed but patent pyloric channel.  4.  Pan colonic diverticulosis, otherwise normal colonoscopy.   RECOMMENDATIONS:  1.  Anti-reflux measures.  2.  Prevacid 30 mg q.a.m.  She can either swallow the pill or let it      dissolve in her mouth.  3.  High-fiber diet.  3.  Fiber Choice chew two tablets every day.  4.  The patient is  advised not to take Motrin while she is on Relafen.      Lionel December, M.D.  Electronically Signed     NR/MEDQ  D:  02/12/2005  T:  02/12/2005  Job:  [id number]   cc:   Patrica Duel, M.D.  Fax: [phone]

## 2010-10-04 NOTE — Consult Note (Signed)
[facility]  Patient:    [name], SIMMONDS Visit Number: [id number] MRN: [id number]          Service Type: MED Location: 3A A326 01 Attending Physician:  Herbert Seta Dictated by:   Franky Macho, M.D. Proc. Date: 11/29/01 Admit Date:  11/29/2001   CC:         Colette Ribas, M.D.  Roetta Sessions, M.D.   Consultation Report  AGE:  77 years old.  REASON FOR CONSULTATION:  Lower abdominal pain, history of diverticulosis.  REFERRING PHYSICIAN:  Colette Ribas, M.D.  HISTORY OF PRESENT ILLNESS:  The patient is an 77 year old black female who is referred for evaluation and treatment of history of diverticulosis and recurrent lower abdominal pain.  She apparently was in the hospital last week with possible diverticulosis and lower abdominal pain as diagnosed by CT scan of the abdomen and pelvis.  She was discharged home and presented back with recurrent pain.  She states that her bowel movements have been for the most part within normal limits.  She denies any hematochezia or melena.  She does state that when she walks, her legs collapse.  The pain is made worse by bending over or walking.  She denies any diarrhea or constipation.  She denies any fever or chills.  She denies any nausea.  PAST MEDICAL AND SURGICAL HISTORIES:  See chart.  CURRENT MEDICATIONS AND ALLERGIES:  See chart.  PHYSICAL EXAMINATION:  GENERAL:  On physical examination, the patient is a pleasant 77 year old black female who is well-developed well-nourished, in no acute distress.  VITAL SIGNS:  She is afebrile and vital signs are stable.  ABDOMEN:  The abdomen is soft, nontender, nondistended.  No specific tenderness or rigidity is noted.  No hernias are noted.  RECTAL:  Rectal examination was deferred at this point as the patient has been seen previously by multiple physicians and it was reportedly negative.  BACK:  Back examination reveals lower back  tenderness.  Upon applying pressure on the lower lumbar spine, the pain is reproduced.  NEUROLOGIC:  Neurologic exam briefly reveals weakness in both legs on flexion of the thighs.  A complete neurological exam was not completed at this point.  LABORATORY DATA:  MET-7 was remarkable for a sodium of 127, white blood cell count 10.6, hematocrit 37, platelet count 355.  CT scan of the abdomen and pelvis today reveals pandiverticulosis without evidence of diverticulitis.  There is no other acute abdominal or pelvic pathology seen.  Of note was the fact that there is an L3-L4 subluxation present with possible impingement on the spinal canal and/or spinal root nerves.  IMPRESSION:  Lower abdominal and leg pain probably secondary to subluxation of lumbar disk.  PLAN:  This has been discussed with Dr. Phillips Odor and Dr. Jena Gauss.  An MRI of the lumbosacral spine has been ordered.  Further management is pending those results.  I doubt any acute surgical pathology within the abdominal cavity is present at this time.  A followup colonoscopy would be indicated in the future once the other symptoms have resolved.  I will follow the patient peripherally with you. Dictated by:   Franky Macho, M.D. Attending Physician:  Herbert Seta DD:  11/29/01 TD:  11/30/01 Job: [id number]

## 2010-10-04 NOTE — H&P (Signed)
[name], [name]           ACCOUNT NO.:  [id number]   MEDICAL RECORD NO.:  [id number]          PATIENT TYPE:  INP   LOCATION:  A328                          FACILITY:  [facility]   PHYSICIAN:  Kirk Ruths, M.D.DATE OF BIRTH:  04-Jun-1912   DATE OF ADMISSION:  01/08/2005  DATE OF DISCHARGE:  LH                                HISTORY & PHYSICAL   CHIEF COMPLAINT:  Nausea, vomiting and diarrhea.   HISTORY OF PRESENT ILLNESS:  This 77 year old with chronic asthma had sudden  onset of nausea, vomiting and diarrhea the day of admission.  The patient  states her son, who she lives with, also had the same symptoms yesterday.  In the emergency room, she was found to have a white count of 19,000 and  afebrile with a slightly tender belly.  She is admitted for control of her  nausea and vomiting and fluids.   PAST MEDICAL HISTORY:  Well-known asthmatic.   MEDICATIONS:  Spiriva, Advair, Singulair and home nebulizers, Theo-Dur.   REVIEW OF SYSTEMS:  Denies chest pains or shortness of breath.   PHYSICAL EXAMINATION:  GENERAL:  Elderly female who appears miserable.  VITAL SIGNS:  Afebrile, blood pressure 150/80, pulse 100 and regular,  respirations 20 and unlabored.  HEENT:  TMs normal, pupils equal round and reactive to light and  accommodation.  Oropharynx benign.  NECK:  Supple, without JVD, bruit or thyromegaly.  LUNGS:  Essentially clear in all areas.  There is occasional expiratory  wheeze.  HEART:  Regular sinus rhythm without murmurs, rubs or gallops.  ABDOMEN:  Hyperactive bowel sounds.  Mild diffuse tenderness with no rebound  or suggestion of acute abdomen.  EXTREMITIES:  Without clubbing, cyanosis or edema.  NEUROLOGIC:  Grossly intact.   ASSESSMENT:  1.  Acute gastroenteritis.  2.  Asthma.      Kirk Ruths, M.D.  Electronically Signed     WMM/MEDQ  D:  01/09/2005  T:  01/09/2005  Job:  [id number]

## 2010-10-04 NOTE — H&P (Signed)
[name], [name]           ACCOUNT NO.:  [id number]   MEDICAL RECORD NO.:  [id number]          PATIENT TYPE:  INP   LOCATION:  A330                          FACILITY:  [facility]   PHYSICIAN:  Kirk Ruths, M.D.DATE OF BIRTH:  05-20-1912   DATE OF ADMISSION:  05/20/2004  DATE OF DISCHARGE:  LH                                HISTORY & PHYSICAL   CHIEF COMPLAINT:  Vomiting and diarrhea.   HISTORY OF PRESENT ILLNESS:  This is an 77 year old female with a 24-hour  history of fever with nausea, vomiting, and diarrhea.  The patient was seen  by home health and Phenergan suppositories have been ordered but were  unsuccessful in controlling her vomiting.  So, she presented to the  emergency room with acute abdominal series that showed no acute abnormality.  She did have diverticulosis.  Her CBC showed a white count of 8400 with a  hemoglobin of 13.3.  Electrolytes were normal except potassium of 2.9.  BUN  and creatinine were 43 and 1.6.  Urinalysis was unremarkable with the  exception of some bacteria.  The patient also was found to be febrile at the  time of admission.   PAST MEDICAL HISTORY:  The patient has a longstanding history of severe  asthmatic bronchitis.  She also has type 2 diabetes which is controlled with  diet.  The patient has hypertension.   MEDICATIONS:  Medications include Theo-Dur 200 mg twice a day, Diovan  160/12.5 daily, Singulair 10 mg daily, along with Mobic 7.5 daily, Clarinex  5 daily, and Ambien 10 mg at bedtime.   SOCIAL HISTORY:  The patient socially is a nonsmoker and nondrinker.   PHYSICAL EXAMINATION:  Elderly female, appears miserable.  Temperature is  101.8 orally.  Pulse is 110 and regular, respirations are 22 and unlabored.  O2 saturations are normal.  HEENT:  TMs are normal.  Pupils are equal,  round, and reactive to light.  Oropharynx is benign.  Neck is supple without  JVD, bruit, or thyromegaly.  Lungs are clear in all areas.  Heart with  a  regular sinus rhythm with a mild tachycardia.  Abdomen is soft with  hyperactive bowel sounds.  No point tenderness.  Extremities are without  cyanosis, clubbing, or edema.  Neurological exam is grossly intact.   ASSESSMENT:  1.  Acute gastroenteritis.  2. Long history of asthmatic bronchitis.  3.      Hypertension.  4. Diet-controlled diabetes.     Will   WMM/MEDQ  D:  05/21/2004  T:  05/21/2004  Job:  [id number]

## 2010-10-04 NOTE — H&P (Signed)
[facility]  Patient:    JASSMIN, KEMMERER Visit Number: [id number] MRN: [id number]          Service Type: MED Location: 3A A314 01 Attending Physician:  Darlin Priestly Dictated by:   Colette Ribas, M.D. Admit Date:  11/22/2001                           History and Physical  ADMITTING DIAGNOSIS:  Abdominal pain.  HISTORY OF PRESENT ILLNESS:  An elderly female with a long history of asthma and hypertension who presented on November 11, 2001, with left-sided abdominal pain.  She had recently had an abdominal and pelvic CT which showed the uterine fibroid on the right side, but showed diverticula on the left side. She had had off and on mild fever and left lower quadrant pain.  No melena and no bright red blood per rectum.  Had had some change in his bowel color. Slight mucosal changes of mucus involvement in the bowel movements as well. Had had a mild asthma flare, but this has improved greatly since she got her hand-held nebulizer.  No fevers.  No rebound tenderness and no other complaints.  No urinary symptoms whatsoever.  No cardiovascular or respiratory complaints.  PAST MEDICAL HISTORY:  Hypertension and as stated asthma.  PAST SURGICAL HISTORY:  Cholecystectomy in 1995.  ALLERGIES:  No known drug allergies.  Does not drink or smoke.  FAMILY HISTORY:   Noncontributory.  SOCIAL HISTORY:  Noncontributory.  PHYSICAL EXAMINATION:  VITAL SIGNS:  Temperature 97.6, pulse 80, respirations 22, and blood pressure 138/68.  Weight 139.  GENERAL:  When I saw her, a pleasant female, but with pale color.  HEENT:  Normocephalic and atraumatic.  Pupils are equal, round and reactive to light.  Extraocular muscles intact.  Nasal and oropharynx are slightly dry mucous membranes.  NECK:  No JVD.  CHEST:  Clear to auscultation bilaterally with great breath sounds for the patient, much greater than normal.  CARDIOVASCULAR:  Regular rate and  rhythm with a soft S1 and S2, no murmurs.  ABDOMEN:  Bowel sounds slightly decreased.  Mild left abdominal pain with palpitations.  No flank pain.  No suprapubic pain.  No rebound and no guarding.  No masses appreciated.  No cyanosis, clubbing, or edema.  ASSESSMENT:  Elderly female with hypertension and asthma who presents with left-sided abdominal pain.  PLAN: 1. Admit for IV fluids and observation. 2. IV Cipro and Flagyl - 500 mg of Flagyl q.i.d. and Cipro 500 mg q.12h. 3. CBC, Chem-12 including liver functions and lipase. 4. Abdominal and pelvic CT. 5. Continue current medications. 6. A GI consult and will continue to follow closely. Dictated by:   Colette Ribas, M.D. Attending Physician:  Darlin Priestly DD:  11/22/01 TD:  11/24/01 Job: [id number]

## 2010-10-04 NOTE — Discharge Summary (Signed)
[facility]  Patient:    [name], [name] Visit Number: [id number] MRN: [id number]          Service Type: MED Location: 3A A326 01 Attending Physician:  Herbert Seta Dictated by:   Colette Ribas, M.D. Admit Date:  11/29/2001 Disc. Date: 11/26/01                             Discharge Summary  DISCHARGE DIAGNOSIS:  Probable diverticulitis.  CONSULTING PHYSICIAN:  Lionel December, M.D. and Roetta Sessions, M.D.  HISTORY OF PRESENT ILLNESS AND PAST MEDICAL HISTORY:  Please admission history and physical.  HOSPITAL COURSE:  This is an 77 year old female with longstanding history of asthma who presented with left-sided abdominal pain.  It was suspected that this was diverticulitis although it was negative by CT.  No other abnormalities seen on abdominal pelvic CT other than diverticulosis.  Dr. Karilyn Cota and Rourk, were consulted which they suggested doing a small-bowel follow-through which again showed extensive diverticulosis but no other acute abnormalities.  The patient did quite well on the IV antibiotics improving quite quickly.  The day after admission she was put on clear liquids and IV antibiotics were continued.  She continued to improve daily and on November 26, 2001, the patient felt remarkably well, no fevers.  Labs have been stable. Urine and blood cultures have remained negative at 48 hours.  The patient had transitioned to p.o. medications and had done well.  Respiratory status was remarkably well, as well.  DISCHARGE PHYSICAL:  Please progress note on the day of discharge.  DISCHARGE MEDICATIONS: 1. Same as admission minus celebrex. 2. We are also going to send her out on Cipro 500 mg b.i.d. for 7 days. 3. Flagyl 500 q.i.d. for 7 days both of which she already has home.  FOLLOWUP:  She is going to follow up in the office in 3 days or sooner if need be.  She is going to follow up with Dr. Karilyn Cota and Dr. Jena Gauss for  outpatient colonoscopy. Dictated by:   Colette Ribas, M.D. Attending Physician:  Herbert Seta DD:  11/26/01 TD:  11/29/01 Job: [id number]

## 2010-10-04 NOTE — Consult Note (Signed)
NAME:  [name], [name]                     ACCOUNT NO.:  [id number]   MEDICAL RECORD NO.:  [id number]                   PATIENT TYPE:  INP   LOCATION:  3030                                 FACILITY:  [facility]   PHYSICIAN:  Danae Orleans. Venetia Maxon, M.D.               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   HISTORY OF PRESENT ILLNESS:  [name] is an 77 year old woman  admitted to [facility] on Oct 13, 2002, by Dr. Sandria Manly with a  diagnosis of gait disorder, numbness, and myelopathy on examination.  An MRI  of the cervical spine was obtained, which demonstrated spinal cord  compression at the C4-5 and C5-6 levels greater than C6-7 level with  increased cord signal at the C4-5 and C5-6 levels.  The patient notes  numbness in her arms, hands, legs, and feet and difficulty with walking.  She also notes urinary incontinence.  She lives alone.  She has 10 living  children.  I spoke with four of her sons this evening in the exam room.   PAST MEDICAL HISTORY:  Significant for asthma.   MEDICATIONS:  She is not sure of her medications.   ALLERGIES:  She denies any drug allergies.   PHYSICAL EXAMINATION:  VITAL SIGNS:  Temperature 97.6 degrees, pulse 84,  respiratory rate 20, blood pressure 134/57.  NEUROLOGIC:  She is awake, alert, and conversant.  She has clear fluent  speech and does not show signs of dementia.  Her cranial nerve examination  revealed pupils equal, round, and reactive to light.  Extraocular movements  are intact.  Facial sensation and facial motor intact and intact.  Hearing  is intact to finger rub.  The palate is upgoing.  Shoulder shrug is  symmetric.  The tongue protrudes in the midline.  Upper extremity strength  is weak in all motor groups bilaterally, including deltoids,  biceps,  triceps, and hand intrinsics and weak in the lower extremities, including  the hip flexors and extensors, knee flexors and extensors, plantar flexion,  dorsiflexion, and EHL, roughly 4/5 in all motor groups.  Distal hand  function is weaker.  She notes decreased pin sensation in both of her upper  and lower extremities that appears to have a cervicothoracic junction  sensory level to pinprick.  Reflexes are bilaterally brisk in the upper and  lower extremities, 3 in the biceps, triceps, and brachial radialis, positive  Hoffman signs bilaterally, 3+ in the right knee and the left knee, and 3 at  the ankles.  Toes are upgoing bilaterally.  The cerebellar examination is  normal.  There does not appear to be significant dysmetria on cerebellar  examination.   IMPRESSION:  1. Herniated cervical disks.  2. Cervical spondylosis versus cervical myelopathy  at C4-5 and C5-6 levels,     greater than C6-7 level, but effecting each of these levels with     increased cord signal at the C4-5 and C5-6 levels.  3. Marked myelopathy on examination.  4. History of asthma.   RECOMMENDATIONS:  I discussed with the patient and her sons her options at  length.  I do not think there is a nonsurgical treatment for cervical  myelopathy secondary to cervical spinal cord compression secondary to  cervical spondylosis.  I have therefore recommend that she undergo surgery  should they wish for her to have treatment for this problem.  She says that  she has no walking to do and that she wants to maintain her activity and be  independent.  I therefore told her that I thought it would be reasonable to  consider proceeding with surgical intervention.  I would like a pulmonary  consult prior to any surgery.  If Dr. Sandria Manly and the patient and her family  wish to go ahead, will plan for surgery in the next few days.  This would  consist of anterior cervical diskectomy, fusion of C4-5, C5-6, and C6-7   levels.                                               Danae Orleans. Venetia Maxon, M.D.    JDS/MEDQ  D:  10/14/2002  T:  10/15/2002  Job:  [id number]

## 2010-10-04 NOTE — Op Note (Signed)
NAME:  [name], [name]                     ACCOUNT NO.:  [id number]   MEDICAL RECORD NO.:  [id number]                   PATIENT TYPE:  INP   LOCATION:  3301                                 FACILITY:  [facility]   PHYSICIAN:  Danae Orleans. Venetia Maxon, M.D.               DATE OF BIRTH:  May 28, 1912   DATE OF PROCEDURE:  10/17/2002  DATE OF DISCHARGE:                                 OPERATIVE REPORT   PREOPERATIVE DIAGNOSES:  Herniated cervical disk and cervical spondylosis,  degenerative disk disease and cervical stenosis with cervical myelopathy, C4-  5 to C5-6 and C6-7 levels.   POSTOPERATIVE DIAGNOSES:  Herniated cervical disk and cervical spondylosis,  degenerative disk disease and cervical stenosis with cervical myelopathy, C4-  5 to C5-6 and C6-7 levels.   PROCEDURE:  Anterior cervical decompression and fusion, C4-5, C5-6 and C6-7  levels, with allograft bone graft and anterior cervical plate.   SURGEON:  Danae Orleans. Venetia Maxon, M.D.   ANESTHESIA:  General endotracheal anesthesia.   ESTIMATED BLOOD LOSS:  300 mL.   COMPLICATIONS:  None.   DISPOSITION:  Recovery.   INDICATIONS:  The patient is a 77 year old woman with significant cervical  myelopathy, cervical stenosis C4-5, C5-6 and C6-7 levels.  It was elected  that she undergo surgery for anterior decompression and fusion at these  affected levels.   DESCRIPTION OF PROCEDURE:  The patient was brought to the operating room.  Following the cessation of uncomplicated induction of general endotracheal  anesthesia and placement of intravenous lines, the patient was placed in the  supine position on the operating table.  Her neck was placed in slight  extension.  She was placed in 10 pounds of Holter traction.  Her anterior  neck was then prepped and draped in the usual sterile fashion.  The area of  planned incision was then infiltrated with 0.25% Marcaine and 0.5% lidocaine  with 1:100,000 epinephrine.  Incision was made from the  midline to the  anterior border of the sternocleidomastoid muscle and carried sharply  through platysmal layer and subplatysmal space.  Dissection was performed  and the anterior cervical spine was exposed keeping the parotid sheath  lateral, keeping the esophagus medial.  Initial x-rays demonstrates the  Smart needle at the C3-4 levels.  Subsequent x-ray demonstrated marker  needle at the C4-5 level.  The longus coli muscles were taken down from the  anterior cervical spine from C4 to C7 levels bilaterally and self-retaining  Shadowline retractor along with a __________retractor was placed.  The  Leksell rongeur was used to remove ventral osteophytes.  Initially at the C5-  6 level, the disk space was incised and the disk material was removed in a  piecemeal fashion using a variety of Karlan curets and pituitary rongeurs.  Subsequently C4-5 and C6-7 levels were similarly decompressed.  The  microscope was then brought into the field and initially at the C5-6 level  the central spinal  dura was decompressed.  There was a large herniated  calcified disk at the C5-6 level on the left which was adherent to the dura  and this was very carefully elevated using microdissection technique.  Both  C6 nerve roots were decompressed widely as they extended up the neural  foramina and the central spinal cord dural was felt to be well decompressed.  Hemostasis was assured with Gelfoam soaked in thrombin and a 7 mm allograft  bone wedge was inserted in the center space and countersunk appropriately.  Attention was then turned to the C4 level where similarly a large disk  herniation was identified and spinal cord dura was decompressed widely as  there was a large osteophyte compressing it.  The left neural foramen  appeared to be well decompressed as well.  Again, hemostasis was assured and  again a 7 mm bone graft was inserted and countersunk appropriately.   Attention was then turned to the C6-7 level  where a similar decompression  was performed.  In a similar fashion, the bone graft was placed.  A 57 mm  anterior cervical plate was then affixed to the anterior cervical spine  using 14 mm x 4 mm variable angle screws at C4-5, C5-6, and C7 levels.  All  screws had excellent purchase.  Final x-ray confirmed positioning of bone  graft and anterior cervical plate.  The wound was copiously irrigated with  Bacitracin and saline.  The platysmal layer was closed with 3-0 Vicryl  sutures, and the skin edges were reapproximated with running 4-0 Vicryl  subcutaneous stitch.  The wound was reapproximated with a running 4-0 Vicryl  subcuticular stitch, and the wound was dressed with Dermabond, and the  patient was extubated in the operating room and taken to the recovery room  in stable and satisfactory condition having tolerated the procedure well.  All counts were correct at the end of the case.                                               Danae Orleans. Venetia Maxon, M.D.    JDS/MEDQ  D:  10/17/2002  T:  10/17/2002  Job:  [id number]

## 2010-10-04 NOTE — H&P (Signed)
NAME:  [name], [name]                     ACCOUNT NO.:  [id number]   MEDICAL RECORD NO.:  [id number]                   PATIENT TYPE:  INP   LOCATION:  3030                                 FACILITY:  [facility]   PHYSICIAN:  Genene Churn. Love, M.D.                 DATE OF BIRTH:  [birth year]   DATE OF ADMISSION:  10/13/2002  DATE OF DISCHARGE:                                HISTORY & PHYSICAL   PATIENT'S ADDRESS:  [address]  [place]  [address]   REASON FOR ADMISSION:  This 77 year old right-handed black married female  comes in with her son for evaluation of the acute onset of gait disturbance.   HISTORY OF PRESENT ILLNESS:  The history from the patient and her son is  very sketchy.  The patient has had a one-month history of some vague walking  problem, but awoke Saturday morning with numbness in her hands and feet and  problems walking.  She was seen at [facility], where  a CT scan of the brain was negative.  She had had some vague pain in her  neck also over the last two months, at times extending into her arm, and she  has had bladder incontinence but no bowel incontinence.  She had a history  of low back pain and has been treated with epidural steroids in the past.  She denies any history of falls.  She has had a positive review of systems  for diffuse headaches, but no history of double vision, slurred speech or  focal weakness.   PAST MEDICAL HISTORY:  Her past medical history is significant for asthma  and hypertension.   MEDICATIONS:  Her medications include Advair, Bextra, Clarinex, Singulair,  Theo-Dur, Diovan HCT, Bentyl and albuterol.   PAST SURGICAL HISTORY:  It is not clear whether she has had any operations.   SOCIAL HISTORY:  Her education level was to the 7th grade; she can read and  write.  She does not use cigarettes or alcohol.  She is married, states that  she is happy with life, denies any home problems, lives  with her daughter,  who did not come with her.  She has had four daughters and five sons;  daughters are 69 through 42 and sons are 64 through 40 in age.  She drinks  one cup of coffee per day, no alcohol, no drugs.   OCCUPATIONAL HISTORY:  She finished the 7th grade.   FAMILY HISTORY:  Her mother died in her 10s of unknown causes.  Her father  died at age 45 from heart trouble.  She has had two sisters, 61 and 71,  living and well and two sisters died at 72 and 26 from cancer.  She has a  brother who died in his 7s.   PHYSICAL EXAMINATION:  GENERAL:  Physical examination revealed a well-  developed black female with a buccolingual  dyskinesia.  VITAL SIGNS:  Her sitting blood pressures in the right and left arm were  160/80.  Heart rate was 96.  NECK:  There were no bruits.  She had  restricted neck motion.  NEUROLOGIC:  Mental status:  She was alert and oriented to person and month.  She knew the president and vice president but not the city or county.  She  felt the year was 32.  Her cranial nerve examination revealed a  buccolingual dyskinesia.  Pupils reacted from 5 to 3 bilaterally.  Both  disks were seen and flat.  Corneals were present.  There was no 7th nerve  palsy.  Tongue was midline and gags were present.  Motor examination  revealed weakness in the upper extremities in the 4/5 range and weakness in  the lower extremities in a 4+/5 range diffusely, both proximally and  distally.  She had sensation with decreased pinprick to T8 and decreased  vibration to T12.  She had decreased joint position in her feet.  She had  decreased pinprick in her arms and in her back.  She had absent deep tendon  reflexes except for 2+ triceps jerks and knee jerks.  Right plantar response  was upgoing; the left plantar response was plus/minus upgoing.  HEENT:  General examination revealed the tympanic membranes to be clear.  LUNGS:  The lungs revealed decreased breath sounds.  HEART:  There  were no heart murmurs.  ABDOMEN:  Bowel sounds were normal.  There was no enlargement of the liver,  spleen or kidneys.  EXTREMITIES:  She walks with her knees locked in recurvatum.  There was no  cyanosis, clubbing or edema in the extremities.  BREASTS:  Her breasts were normal.   IMPRESSION AND PLAN:  1. Gait disorder, code 781.2.  2. Numbness, code 782.0.  3. Dementia, code 780.93.  4. Asthma, code 493.90.  5. Hypertension, code 796.2.   Because of the acute onset of this patient's symptomatology, I am concerned  she may have had a stroke of the spinal cord.  She will be admitted for  further evaluation to include MRI of the cervical and thoracic spine and B12  level.                                               Genene Churn. Sandria Manly, M.D.    JML/MEDQ  D:  10/13/2002  T:  10/14/2002  Job:  [id number]   cc:   Corrie Mckusick, M.D.  [address] [place]  [place] [address]  Fax: [phone]

## 2010-11-14 ENCOUNTER — Ambulatory Visit: Payer: PRIVATE HEALTH INSURANCE | Admitting: Gastroenterology

## 2011-02-07 LAB — URINALYSIS, ROUTINE W REFLEX MICROSCOPIC
Bilirubin Urine: NEGATIVE
Glucose, UA: NEGATIVE
Ketones, ur: NEGATIVE
Nitrite: NEGATIVE
Protein, ur: NEGATIVE
pH: 6.5

## 2011-02-07 LAB — CBC
HCT: 35.1 — ABNORMAL LOW
MCV: 100
Platelets: 216
RDW: 15.3
WBC: 8.1

## 2011-02-07 LAB — DIFFERENTIAL
Basophils Absolute: 0
Basophils Relative: 0
Eosinophils Absolute: 0.1
Eosinophils Relative: 1
Neutrophils Relative %: 67

## 2011-02-07 LAB — BASIC METABOLIC PANEL
BUN: 23
Chloride: 105
Creatinine, Ser: 1.07
Glucose, Bld: 105 — ABNORMAL HIGH
Potassium: 4.1

## 2011-02-13 LAB — URINE CULTURE: Culture: NO GROWTH

## 2011-02-13 LAB — COMPREHENSIVE METABOLIC PANEL
ALT: 28
ALT: 22
ALT: 27
AST: 18
Albumin: 3 — ABNORMAL LOW
Albumin: 3.1 — ABNORMAL LOW
Alkaline Phosphatase: 64
Alkaline Phosphatase: 59
Alkaline Phosphatase: 60
BUN: 44 — ABNORMAL HIGH
CO2: 27
Calcium: 9.3
Chloride: 108
GFR calc Af Amer: 48 — ABNORMAL LOW
GFR calc non Af Amer: 33 — ABNORMAL LOW
Glucose, Bld: 149 — ABNORMAL HIGH
Glucose, Bld: 86
Potassium: 4.2
Potassium: 4.4
Sodium: 138
Sodium: 146 — ABNORMAL HIGH
Total Bilirubin: 0.4
Total Protein: 5.3 — ABNORMAL LOW

## 2011-02-13 LAB — CBC
HCT: 36
Hemoglobin: 12.1
Hemoglobin: 11.2 — ABNORMAL LOW
MCHC: 33.7
Platelets: 191
RBC: 3.57 — ABNORMAL LOW
RBC: 3.32 — ABNORMAL LOW
RBC: 3.32 — ABNORMAL LOW
RDW: 14.4
RDW: 14
WBC: 10.4

## 2011-02-13 LAB — URINALYSIS, ROUTINE W REFLEX MICROSCOPIC
Ketones, ur: NEGATIVE
Nitrite: NEGATIVE
Protein, ur: NEGATIVE
Urobilinogen, UA: 0.2

## 2011-02-13 LAB — DIFFERENTIAL
Basophils Absolute: 0
Basophils Relative: 0
Basophils Relative: 0
Basophils Relative: 1
Eosinophils Absolute: 0.1
Eosinophils Absolute: 0.1
Eosinophils Absolute: 0.1
Eosinophils Relative: 1
Eosinophils Relative: 1
Lymphs Abs: 1.7
Monocytes Absolute: 0.8
Monocytes Absolute: 0.9
Monocytes Relative: 8
Neutro Abs: 7.1
Neutrophils Relative %: 72
Neutrophils Relative %: 71

## 2011-02-13 LAB — VITAMIN B12: Vitamin B-12: >2000 — ABNORMAL HIGH (ref 211–911)

## 2011-02-13 LAB — FOLATE RBC: RBC Folate: 2933 — ABNORMAL HIGH

## 2011-02-13 LAB — LIPASE, BLOOD: Lipase: 20

## 2011-02-13 LAB — PROTIME-INR: INR: 1

## 2011-02-19 LAB — DIFFERENTIAL
Basophils Absolute: 0
Basophils Relative: 0
Basophils Relative: 0
Eosinophils Absolute: 0
Eosinophils Absolute: 0
Eosinophils Absolute: 0.2
Eosinophils Absolute: 0.3
Eosinophils Absolute: 0.3
Eosinophils Relative: 0
Eosinophils Relative: 1
Eosinophils Relative: 1
Eosinophils Relative: 2
Eosinophils Relative: 3
Lymphocytes Relative: 3 — ABNORMAL LOW
Lymphocytes Relative: 7 — ABNORMAL LOW
Lymphs Abs: 0.6 — ABNORMAL LOW
Lymphs Abs: 0.7
Lymphs Abs: 0.8
Lymphs Abs: 1.3
Lymphs Abs: 1.6
Monocytes Absolute: 0.5
Monocytes Absolute: 0.7
Monocytes Absolute: 1
Monocytes Absolute: 1.4 — ABNORMAL HIGH
Monocytes Relative: 3
Monocytes Relative: 3
Neutro Abs: 18.7 — ABNORMAL HIGH
Neutrophils Relative %: 92 — ABNORMAL HIGH
Neutrophils Relative %: 93 — ABNORMAL HIGH

## 2011-02-19 LAB — COMPREHENSIVE METABOLIC PANEL
Albumin: 2.7 — ABNORMAL LOW
Alkaline Phosphatase: 59
BUN: 31 — ABNORMAL HIGH
CO2: 21
Chloride: 105
Creatinine, Ser: 1.61 — ABNORMAL HIGH
GFR calc non Af Amer: 30 — ABNORMAL LOW
Glucose, Bld: 82
Potassium: 5.1
Total Bilirubin: 0.7

## 2011-02-19 LAB — GLUCOSE, CAPILLARY
Glucose-Capillary: 100 — ABNORMAL HIGH
Glucose-Capillary: 109 — ABNORMAL HIGH
Glucose-Capillary: 113 — ABNORMAL HIGH
Glucose-Capillary: 113 — ABNORMAL HIGH
Glucose-Capillary: 130 — ABNORMAL HIGH
Glucose-Capillary: 64 — ABNORMAL LOW
Glucose-Capillary: 78
Glucose-Capillary: 83
Glucose-Capillary: 92
Glucose-Capillary: 97

## 2011-02-19 LAB — BASIC METABOLIC PANEL
BUN: 27 — ABNORMAL HIGH
BUN: 30 — ABNORMAL HIGH
BUN: 7
Calcium: 8.8
Chloride: 100
Chloride: 105
Chloride: 105
Chloride: 106
Creatinine, Ser: 1.57 — ABNORMAL HIGH
GFR calc Af Amer: 37 — ABNORMAL LOW
GFR calc Af Amer: 51 — ABNORMAL LOW
GFR calc non Af Amer: 33 — ABNORMAL LOW
Glucose, Bld: 83
Glucose, Bld: 83
Potassium: 3.5
Potassium: 3.9
Potassium: 4.7
Sodium: 135
Sodium: 136

## 2011-02-19 LAB — URINALYSIS, ROUTINE W REFLEX MICROSCOPIC
Bilirubin Urine: NEGATIVE
Glucose, UA: NEGATIVE
Hgb urine dipstick: NEGATIVE
Ketones, ur: NEGATIVE
Protein, ur: NEGATIVE
pH: 5.5

## 2011-02-19 LAB — CBC
HCT: 27.4 — ABNORMAL LOW
HCT: 29.4 — ABNORMAL LOW
HCT: 29.7 — ABNORMAL LOW
HCT: 31 — ABNORMAL LOW
HCT: 33.7 — ABNORMAL LOW
Hemoglobin: 10.2 — ABNORMAL LOW
Hemoglobin: 10.5 — ABNORMAL LOW
Hemoglobin: 11.3 — ABNORMAL LOW
Hemoglobin: 9.5 — ABNORMAL LOW
MCHC: 33.3
MCHC: 34.7
MCV: 100.3 — ABNORMAL HIGH
MCV: 99.1
MCV: 99.2
MCV: 99.6
Platelets: 334
Platelets: 343
Platelets: 391
Platelets: 400
RBC: 3.12 — ABNORMAL LOW
RDW: 14.8
WBC: 10.7 — ABNORMAL HIGH
WBC: 18.5 — ABNORMAL HIGH
WBC: 19 — ABNORMAL HIGH
WBC: 20.1 — ABNORMAL HIGH
WBC: 21.6 — ABNORMAL HIGH

## 2011-02-19 LAB — STOOL CULTURE

## 2011-02-19 LAB — CLOSTRIDIUM DIFFICILE EIA: C difficile Toxins A+B, EIA: NEGATIVE

## 2012-02-12 ENCOUNTER — Other Ambulatory Visit (HOSPITAL_COMMUNITY): Payer: Self-pay | Admitting: Physician Assistant

## 2012-02-12 ENCOUNTER — Ambulatory Visit (HOSPITAL_COMMUNITY)
Admission: RE | Admit: 2012-02-12 | Discharge: 2012-02-12 | Disposition: A | Discharge status: Home / Self Care | Payer: PRIVATE HEALTH INSURANCE | Source: Ambulatory Visit | Attending: Physician Assistant | Admitting: Physician Assistant

## 2012-02-12 DIAGNOSIS — M79609 Pain in unspecified limb: Secondary | ICD-10-CM | POA: Insufficient documentation

## 2012-02-12 DIAGNOSIS — X58XXXA Exposure to other specified factors, initial encounter: Secondary | ICD-10-CM | POA: Insufficient documentation

## 2012-02-12 DIAGNOSIS — S92919A Unspecified fracture of unspecified toe(s), initial encounter for closed fracture: Secondary | ICD-10-CM | POA: Insufficient documentation

## 2012-02-12 DIAGNOSIS — S90129A Contusion of unspecified lesser toe(s) without damage to nail, initial encounter: Secondary | ICD-10-CM

## 2012-06-07 ENCOUNTER — Other Ambulatory Visit (HOSPITAL_COMMUNITY): Payer: Self-pay | Admitting: Nephrology

## 2012-06-07 DIAGNOSIS — N289 Disorder of kidney and ureter, unspecified: Secondary | ICD-10-CM

## 2012-06-21 ENCOUNTER — Ambulatory Visit (HOSPITAL_COMMUNITY)
Admission: RE | Admit: 2012-06-21 | Discharge: 2012-06-21 | Disposition: A | Discharge status: Home / Self Care | Payer: PRIVATE HEALTH INSURANCE | Source: Ambulatory Visit | Attending: Nephrology | Admitting: Nephrology

## 2012-06-21 DIAGNOSIS — N289 Disorder of kidney and ureter, unspecified: Secondary | ICD-10-CM | POA: Insufficient documentation

## 2012-07-12 ENCOUNTER — Telehealth (HOSPITAL_COMMUNITY): Payer: Self-pay | Admitting: Oncology

## 2012-07-14 ENCOUNTER — Encounter (HOSPITAL_COMMUNITY): Payer: PRIVATE HEALTH INSURANCE | Attending: Oncology | Admitting: Oncology

## 2012-07-14 ENCOUNTER — Encounter (HOSPITAL_COMMUNITY): Payer: Self-pay | Admitting: Oncology

## 2012-07-14 VITALS — BP 126/68 | HR 87 | Temp 97.9°F | Resp 16 | Ht 58.25 in | Wt 97.5 lb

## 2012-07-14 DIAGNOSIS — I10 Essential (primary) hypertension: Secondary | ICD-10-CM | POA: Insufficient documentation

## 2012-07-14 DIAGNOSIS — J4489 Other specified chronic obstructive pulmonary disease: Secondary | ICD-10-CM | POA: Insufficient documentation

## 2012-07-14 DIAGNOSIS — I739 Peripheral vascular disease, unspecified: Secondary | ICD-10-CM | POA: Insufficient documentation

## 2012-07-14 DIAGNOSIS — D472 Monoclonal gammopathy: Secondary | ICD-10-CM | POA: Insufficient documentation

## 2012-07-14 DIAGNOSIS — E119 Type 2 diabetes mellitus without complications: Secondary | ICD-10-CM | POA: Insufficient documentation

## 2012-07-14 NOTE — Patient Instructions (Addendum)
[facility] Discharge Instructions  RECOMMENDATIONS MADE BY THE CONSULTANT AND ANY TEST RESULTS WILL BE SENT TO YOUR REFERRING PHYSICIAN.  EXAM FINDINGS BY THE PHYSICIAN TODAY AND SIGNS OR SYMPTOMS TO REPORT TO CLINIC OR PRIMARY PHYSICIAN: Exam and discussion by Dr. Mariel Sleet.  Will check some blood work and see you back in 6 months for blood work and to see MD. Drink extra fluids tonight and tomorrow morning.  MEDICATIONS PRESCRIBED:  none  INSTRUCTIONS GIVEN AND DISCUSSED: Report uncontrolled pain, fevers, night sweats, etc  SPECIAL INSTRUCTIONS/FOLLOW-UP: Tomorrow at 12 noon for blood work, then in 6 months for blood work and to be seen.  Thank you for choosing [facility] to provide your oncology and hematology care.  To afford each patient quality time with our providers, please arrive at least 15 minutes before your scheduled appointment time.  With your help, our goal is to use those 15 minutes to complete the necessary work-up to ensure our physicians have the information they need to help with your evaluation and healthcare recommendations.    Effective January 1st, 2014, we ask that you re-schedule your appointment with our physicians should you arrive 10 or more minutes late for your appointment.  We strive to give you quality time with our providers, and arriving late affects you and other patients whose appointments are after yours.    Again, thank you for choosing [facility].  Our hope is that these requests will decrease the amount of time that you wait before being seen by our physicians.       _____________________________________________________________  Should you have questions after your visit to [facility], please contact our office at [phone] between the hours of 8:30 a.m. and 5:00 p.m.  Voicemails left after 4:30 p.m. will not be returned until the following business day.  For prescription refill  requests, have your pharmacy contact our office with your prescription refill request.

## 2012-07-14 NOTE — Progress Notes (Signed)
Unable to obtain blood work after attempts x 2 and requested that patient increase her fluid intake tonight and tomorrow morning and to return @ 12 noon on Thursday.

## 2012-07-14 NOTE — Progress Notes (Signed)
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 She needs followup in 6 months #2 renal insufficiency, acute on chronic much improved since stopping nonsteroidal inflammatory drug #3 peripheral vascular disease with trace to 1+ dorsalis pedis pulse only on the left #4 COPD #5 history of GERD #6 trauma to the right big toe and foot in a boot at this time #7 diabetes mellitus on therapy #8 chronic constipation #9 hypertension #10 difficulty sleeping using Ambien 5 mg occasionally. #11 decreased hearing  Very pleasant lady who is accompanied by her 20 year old daughter. She was found to have renal insufficiency and monoclonal protein in her serum. She had no excess protein in her urine whatsoever and no spike of course because of that. She does not have hypercalcemia. The monoclonal spike consists of 270 mg/dL consisting of IgA kappa protein. Her IgG level is normal, IgM levels mildly low, IgA level was mildly elevated at best. Recent hemoglobin was 12.1 g 02/18/2013 hematocrit was normal at 36%. Sedimentation rate was only 28. She has not lost weight, does not have fevers, chills, back pain, night sweats, etc. she does have complaints of arthritis in her hands and finger joints. She complains of foot pain as well in her toes and bones of her feet. She lives with one of her daughters. She has not lost weight last 6 months. Her appetite is not good and she feels that she has to force herself to eat. She weight no more than 127 pounds even at her peak even when pregnant.  BP 126/68  Pulse 87  Temp(Src) 97.9 F (36.6 C) (Oral)  Resp 16  Ht 4' 10.25" (1.48 m)  Wt 97 lb 8 oz (44.226 kg)  BMI 20.19 kg/m2  Very pleasant but hard of hearing lady in no acute distress. She has no lymphadenopathy in any location. She has no thyromegaly. She has an upper dental plate. She has no lower teeth. Throat is clear. Pupils show changes of prior cataract operations. She does have a membrane over the medial  portion of the right eye but states that does not interfere with her vision. She does wear glasses. Breast exam is negative for masses. Heart shows a grade 1/6 systolic ejection murmur. There is no S3 gallop. Lungs are clear to auscultation and percussion. Skin exam is unremarkable. Abdomen is soft and nontender without hepatosplenomegaly. She is left handed. She has no arm edema. She has no leg edema. Pulses are diminished in her feet.  Interestingly her hands show minimal to no changes of arthritis. There is no increased warmth, swelling, etc.  This appears to be a MGUS. We will obtain baseline blood work today and see her back in 6 months. I would not repeat her urine protein evaluation at this time. We'll see what her blood work shows in 6 months. At her age I do not think x-rays of her back with no symptomatology would be helpful.

## 2012-07-15 ENCOUNTER — Encounter (HOSPITAL_BASED_OUTPATIENT_CLINIC_OR_DEPARTMENT_OTHER): Payer: PRIVATE HEALTH INSURANCE

## 2012-07-15 LAB — COMPREHENSIVE METABOLIC PANEL
ALT: 10 U/L (ref 0–35)
CO2: 31 mEq/L (ref 19–32)
Calcium: 10 mg/dL (ref 8.4–10.5)
Chloride: 97 mEq/L (ref 96–112)
Creatinine, Ser: 1.33 mg/dL — ABNORMAL HIGH (ref 0.50–1.10)
GFR calc Af Amer: 38 mL/min — ABNORMAL LOW (ref >90)
GFR calc non Af Amer: 32 mL/min — ABNORMAL LOW (ref >90)
Glucose, Bld: 149 mg/dL — ABNORMAL HIGH (ref 70–99)
Sodium: 138 mEq/L (ref 135–145)
Total Bilirubin: 0.2 mg/dL — ABNORMAL LOW (ref 0.3–1.2)

## 2012-07-15 LAB — CBC WITH DIFFERENTIAL/PLATELET
Eosinophils Relative: 3 % (ref 0–5)
HCT: 36.3 % (ref 36.0–46.0)
Lymphocytes Relative: 29 % (ref 12–46)
Lymphs Abs: 2.2 10*3/uL (ref 0.7–4.0)
MCV: 99.2 fL (ref 78.0–100.0)
Monocytes Absolute: 0.5 10*3/uL (ref 0.1–1.0)
RBC: 3.66 MIL/uL — ABNORMAL LOW (ref 3.87–5.11)
WBC: 7.5 10*3/uL (ref 4.0–10.5)

## 2012-07-15 NOTE — Addendum Note (Signed)
Addended byLeida Lauth on: 07/15/2012 12:27 PM   Modules accepted: Orders

## 2012-07-15 NOTE — Progress Notes (Signed)
Labs drawn today for cbc/diff,cmp,sed rate, mm panel,kllc

## 2012-07-16 LAB — KAPPA/LAMBDA LIGHT CHAINS: Kappa, lambda light chain ratio: 1.29 (ref 0.26–1.65)

## 2012-07-20 LAB — MULTIPLE MYELOMA PANEL, SERUM
Alpha-2-Globulin: 12.6 % — ABNORMAL HIGH (ref 7.1–11.8)
Beta 2: 4.2 % (ref 3.2–6.5)
Gamma Globulin: 12.6 % (ref 11.1–18.8)
IgG (Immunoglobin G), Serum: 942 mg/dL (ref 690–1700)
M-Spike, %: 0.29 g/dL

## 2013-01-12 ENCOUNTER — Other Ambulatory Visit (HOSPITAL_COMMUNITY): Payer: PRIVATE HEALTH INSURANCE

## 2013-01-14 ENCOUNTER — Ambulatory Visit (HOSPITAL_COMMUNITY): Payer: PRIVATE HEALTH INSURANCE | Admitting: Oncology

## 2013-01-14 NOTE — Progress Notes (Signed)
-  No show, letter sent-   

## 2013-01-26 ENCOUNTER — Encounter (HOSPITAL_COMMUNITY): Payer: PRIVATE HEALTH INSURANCE | Attending: Hematology and Oncology

## 2013-01-26 ENCOUNTER — Encounter (HOSPITAL_COMMUNITY): Payer: Self-pay

## 2013-01-26 VITALS — BP 137/72 | HR 88 | Temp 98.6°F | Resp 16 | Wt 99.3 lb

## 2013-01-26 DIAGNOSIS — D472 Monoclonal gammopathy: Secondary | ICD-10-CM

## 2013-01-26 DIAGNOSIS — G609 Hereditary and idiopathic neuropathy, unspecified: Secondary | ICD-10-CM

## 2013-01-26 DIAGNOSIS — Z09 Encounter for follow-up examination after completed treatment for conditions other than malignant neoplasm: Secondary | ICD-10-CM | POA: Insufficient documentation

## 2013-01-26 LAB — COMPREHENSIVE METABOLIC PANEL
ALT: 10 U/L (ref 0–35)
AST: 21 U/L (ref 0–37)
Albumin: 4 g/dL (ref 3.5–5.2)
Alkaline Phosphatase: 66 U/L (ref 39–117)
BUN: 43 mg/dL — ABNORMAL HIGH (ref 6–23)
Chloride: 102 mEq/L (ref 96–112)
Potassium: 3.5 mEq/L (ref 3.5–5.1)
Sodium: 142 mEq/L (ref 135–145)
Total Bilirubin: 0.2 mg/dL — ABNORMAL LOW (ref 0.3–1.2)
Total Protein: 7.2 g/dL (ref 6.0–8.3)

## 2013-01-26 LAB — CBC WITH DIFFERENTIAL/PLATELET
Eosinophils Relative: 1 % (ref 0–5)
HCT: 38.9 % (ref 36.0–46.0)
Lymphocytes Relative: 17 % (ref 12–46)
Lymphs Abs: 1.7 10*3/uL (ref 0.7–4.0)
MCV: 99 fL (ref 78.0–100.0)
Monocytes Absolute: 0.7 10*3/uL (ref 0.1–1.0)
Monocytes Relative: 7 % (ref 3–12)
RBC: 3.93 MIL/uL (ref 3.87–5.11)
WBC: 10.4 10*3/uL (ref 4.0–10.5)

## 2013-01-26 NOTE — Patient Instructions (Addendum)
[facility] Discharge Instructions  RECOMMENDATIONS MADE BY THE CONSULTANT AND ANY TEST RESULTS WILL BE SENT TO YOUR REFERRING PHYSICIAN.  EXAM FINDINGS BY THE PHYSICIAN TODAY AND SIGNS OR SYMPTOMS TO REPORT TO CLINIC OR PRIMARY PHYSICIAN: examined by Dr. Sharia Reeve.  Lab work today will call with any abnormal results.  Will follow up with blood work and appt in 6 months.   SPECIAL INSTRUCTIONS/FOLLOW-UP: In ^ months with dr appt and lab work.  Thank you for choosing [facility] to provide your oncology and hematology care.  To afford each patient quality time with our providers, please arrive at least 15 minutes before your scheduled appointment time.  With your help, our goal is to use those 15 minutes to complete the necessary work-up to ensure our physicians have the information they need to help with your evaluation and healthcare recommendations.    Effective January 1st, 2014, we ask that you re-schedule your appointment with our physicians should you arrive 10 or more minutes late for your appointment.  We strive to give you quality time with our providers, and arriving late affects you and other patients whose appointments are after yours.    Again, thank you for choosing [facility].  Our hope is that these requests will decrease the amount of time that you wait before being seen by our physicians.       _____________________________________________________________  Should you have questions after your visit to [facility], please contact our office at [phone] between the hours of 8:30 a.m. and 5:00 p.m.  Voicemails left after 4:30 p.m. will not be returned until the following business day.  For prescription refill requests, have your pharmacy contact our office with your prescription refill request.

## 2013-01-26 NOTE — Progress Notes (Signed)
[name] presented for labwork. Labs per MD order drawn via Peripheral Line 23 gauge needle inserted in let wrist.  Good blood return present. Procedure without incident.  Needle removed intact. Patient tolerated procedure well.

## 2013-01-26 NOTE — Progress Notes (Signed)
[facility] Telephone:([phone]   Fax:([phone]) [phone] OFFICE PROGRESS NOTE  Colette Ribas, MD [address] [place] [address]  DIAGNOSIS: IG Kappa Mgus.  INTERVAL HISTORY:   [name] 79 y.o. female returns to the clinic today for scheduled follow up visit in the company of 2 of her daughters Britta Mccreedy and Pattricia Boss. The last SPEP on records  showed M- Spike was 0.29 in February of 2014. IgG was normal  Kappa and lambda were slightly elevated at 4.33 and 3.35  respectively with normal ration of 1.29.  Patient' speech is difficult to understand however she does complain of tingling and pain in both hands and feet which is long-standing and essentially unchanged.  She is on  Vicodin with some relief. There is no report of any new problems since her last visit.   MEDICAL HISTORY: Past Medical History  Diagnosis Date  . Urinary retention   . Gastritis   . Esophageal ulcer   . Salmonella sepsis   . Degenerative disk disease   . Spinal stenosis   . Asthma   . COPD (chronic obstructive pulmonary disease)   . Diabetes mellitus, type 2   . Diverticulosis of colon   . GERD (gastroesophageal reflux disease)   . Hypertension   . Osteoarthritis   . Hiatal hernia   . Pancreatitis   . S/P endoscopy 2009    Dr. Darrick Penna: gastritis  . Diverticulitis   . S/P endoscopy March 2012    Dr. Jena Gauss: NSAID induced ulcer    ALLERGIES:  has No Known Allergies.  MEDICATIONS:  Current Outpatient Prescriptions  Medication Sig Dispense Refill  . Calcium Carbonate (CALCIUM 600 PO) Take 600 mg by mouth daily.      . Calcium Citrate-Vitamin D 200-250 MG-UNIT TABS Take 1 tablet by mouth daily.      . ferrous sulfate 325 (65 FE) MG tablet Take 325 mg by mouth daily with breakfast.      . fluticasone-salmeterol (ADVAIR HFA) 45-21 MCG/ACT inhaler Inhale 2 puffs into the lungs 2 (two) times daily.        . furosemide (LASIX) 20 MG tablet Take 20  mg by mouth as needed. Take only as needed for swelling      . HYDROcodone-acetaminophen (NORCO/VICODIN) 5-325 MG per tablet Take 1 tablet by mouth every 6 (six) hours as needed for pain.      Marland Kitchen lidocaine (LIDODERM) 5 % Place 1 patch onto the skin daily. Remove & Discard patch within 12 hours or as directed by MD       . meloxicam (MOBIC) 7.5 MG tablet Take 7.5 mg by mouth daily.        . methocarbamol (ROBAXIN) 500 MG tablet Take 500 mg by mouth at bedtime.      . montelukast (SINGULAIR) 10 MG tablet Take 10 mg by mouth at bedtime.        . Multiple Vitamins-Calcium (ONE-A-DAY WOMENS FORMULA PO) Take 1 tablet by mouth daily.      Marland Kitchen omeprazole (PRILOSEC) 20 MG capsule Take 20 mg by mouth daily.        . ondansetron (ZOFRAN-ODT) 8 MG disintegrating tablet Take 8 mg by mouth every 8 (eight) hours as needed for nausea.      . pantoprazole (PROTONIX) 40 MG tablet Take 40 mg by mouth 2 (two) times daily.        . potassium chloride (K-DUR) 10 MEQ tablet  Take 10 mEq by mouth daily.      . pramoxine (PROCTOFOAM) 1 % foam Place 1 application rectally 3 (three) times daily as needed for hemorrhoids.      . solifenacin (VESICARE) 5 MG tablet Take 5 mg by mouth daily.      . theophylline (THEODUR) 200 MG 12 hr tablet Take 200 mg by mouth 2 (two) times daily.      Marland Kitchen zolpidem (AMBIEN) 5 MG tablet Take 5 mg by mouth at bedtime as needed.         No current facility-administered medications for this visit.    SURGICAL HISTORY:  Past Surgical History  Procedure Laterality Date  . Cervical fusion    . Cholecystectomy       REVIEW OF SYSTEMS: As above otherwise pending.  PHYSICAL EXAMINATION:  Blood pressure 137/72, pulse 88, temperature 98.6 F (37 C), temperature source Tympanic, resp. rate 16, weight 99 lb 4.8 oz (45.042 kg). GENERAL: No acute distress. Sitting in a wheel chair. Elderly female. SKIN:  No rashes or significant lesions . No ecchymosis or petechial rash. HEAD: Normocephalic, No  masses, lesions, tenderness or abnormalities  EYES: Conjunctiva are pink and non-injected and no jaundice ENT: External ears normal ,lips, buccal mucosa, and tongue normal and mucous membranes are moist . No evidence of thrush. LYMPH: No palpable lymphadenopathy, in the neck, supraclavicular areas. LUNGS: Clear to auscultation , no crackles or wheezes HEART: regular rate & rhythm, no murmurs, no gallops, S1 normal and S2 normal and no S3. ABDOMEN: Abdomen soft, non-tender, no masses or organomegaly and no hepatosplenomegaly palpable EXTREMITIES: No edema, no skin discoloration or tenderness NEURO: Alert      LABORATORY DATA: Lab Results  Component Value Date   WBC 7.5 07/15/2012   HGB 12.1 07/15/2012   HCT 36.3 07/15/2012   MCV 99.2 07/15/2012   PLT 181 07/15/2012  the well-healed he is currently is    Chemistry      Component Value Date/Time   NA 138 07/15/2012 1218   K 3.8 07/15/2012 1218   CL 97 07/15/2012 1218   CO2 31 07/15/2012 1218   BUN 37* 07/15/2012 1218   CREATININE 1.33* 07/15/2012 1218      Component Value Date/Time   CALCIUM 10.0 07/15/2012 1218   ALKPHOS 68 07/15/2012 1218   AST 19 07/15/2012 1218   ALT 10 07/15/2012 1218   BILITOT 0.2* 07/15/2012 1218       RADIOGRAPHIC STUDIES: No results found.   ASSESSMENT:  IgG Kappa Mgus. I will repeat her CBC CMP and multiple myeloma panel to see where she is in the process.  Etiology of her neuropathy pain isn't clear to me , it possible this could be related to her monoclonal gammopathy which I doubt seriously.  However this is possible.   PLAN:  1. CBC/CMP/myeloma panel today. 2. Patient to continue Opioid analgesics when necessary for pain. I'm not sure that adding Neurontin will help and could cause drowsiness and falls. 3. Return to clinic in 6 months. 4. Follow up with today's labs.    All questions were satisfactorily answered. Patient knows to call if  any concern arises.  I spent more than 50 % counseling  the patient face to face. The total time spent in the appointment was 30 minutes.   Sherral Hammers, MD FACP. Hematology/Oncology.

## 2013-01-27 LAB — KAPPA/LAMBDA LIGHT CHAINS: Lambda free light chains: 3.48 mg/dL — ABNORMAL HIGH (ref 0.57–2.63)

## 2013-01-28 LAB — PROTEIN ELECTROPHORESIS, SERUM
Alpha-2-Globulin: 12.5 % — ABNORMAL HIGH (ref 7.1–11.8)
Beta 2: 4.2 % (ref 3.2–6.5)
Beta Globulin: 5.8 % (ref 4.7–7.2)
M-Spike, %: 0.27 g/dL
Total Protein ELP: 6.7 g/dL (ref 6.0–8.3)

## 2013-01-28 LAB — MULTIPLE MYELOMA PANEL, SERUM
Alpha-2-Globulin: 12.3 % — ABNORMAL HIGH (ref 7.1–11.8)
Beta Globulin: 5.7 % (ref 4.7–7.2)
Gamma Globulin: 13.4 % (ref 11.1–18.8)
IgA: 289 mg/dL (ref 69–380)
M-Spike, %: 0.26 g/dL

## 2013-02-19 ENCOUNTER — Encounter: Payer: Self-pay | Admitting: Hematology and Oncology

## 2013-03-02 ENCOUNTER — Emergency Department (HOSPITAL_COMMUNITY)
Admission: EM | Admit: 2013-03-02 | Discharge: 2013-03-02 | Disposition: A | Discharge status: Home / Self Care | Payer: PRIVATE HEALTH INSURANCE | Attending: Emergency Medicine | Admitting: Emergency Medicine

## 2013-03-02 ENCOUNTER — Encounter (HOSPITAL_COMMUNITY): Payer: Self-pay | Admitting: Emergency Medicine

## 2013-03-02 ENCOUNTER — Emergency Department (HOSPITAL_COMMUNITY): Payer: PRIVATE HEALTH INSURANCE

## 2013-03-02 DIAGNOSIS — K2970 Gastritis, unspecified, without bleeding: Secondary | ICD-10-CM | POA: Insufficient documentation

## 2013-03-02 DIAGNOSIS — Z9889 Other specified postprocedural states: Secondary | ICD-10-CM | POA: Insufficient documentation

## 2013-03-02 DIAGNOSIS — Z8619 Personal history of other infectious and parasitic diseases: Secondary | ICD-10-CM | POA: Insufficient documentation

## 2013-03-02 DIAGNOSIS — J449 Chronic obstructive pulmonary disease, unspecified: Secondary | ICD-10-CM | POA: Insufficient documentation

## 2013-03-02 DIAGNOSIS — R109 Unspecified abdominal pain: Secondary | ICD-10-CM

## 2013-03-02 DIAGNOSIS — M1990 Unspecified osteoarthritis, unspecified site: Secondary | ICD-10-CM | POA: Insufficient documentation

## 2013-03-02 DIAGNOSIS — E119 Type 2 diabetes mellitus without complications: Secondary | ICD-10-CM | POA: Insufficient documentation

## 2013-03-02 DIAGNOSIS — R1033 Periumbilical pain: Secondary | ICD-10-CM | POA: Insufficient documentation

## 2013-03-02 DIAGNOSIS — IMO0002 Reserved for concepts with insufficient information to code with codable children: Secondary | ICD-10-CM | POA: Insufficient documentation

## 2013-03-02 DIAGNOSIS — K5900 Constipation, unspecified: Secondary | ICD-10-CM | POA: Insufficient documentation

## 2013-03-02 DIAGNOSIS — I10 Essential (primary) hypertension: Secondary | ICD-10-CM | POA: Insufficient documentation

## 2013-03-02 DIAGNOSIS — J4489 Other specified chronic obstructive pulmonary disease: Secondary | ICD-10-CM | POA: Insufficient documentation

## 2013-03-02 DIAGNOSIS — K219 Gastro-esophageal reflux disease without esophagitis: Secondary | ICD-10-CM | POA: Insufficient documentation

## 2013-03-02 LAB — CBC WITH DIFFERENTIAL/PLATELET
Eosinophils Absolute: 0.1 10*3/uL (ref 0.0–0.7)
HCT: 39.6 % (ref 36.0–46.0)
Hemoglobin: 13.4 g/dL (ref 12.0–15.0)
Lymphs Abs: 1.8 10*3/uL (ref 0.7–4.0)
MCH: 33.6 pg (ref 26.0–34.0)
MCHC: 33.8 g/dL (ref 30.0–36.0)
Monocytes Absolute: 0.6 10*3/uL (ref 0.1–1.0)
Monocytes Relative: 7 % (ref 3–12)
Neutro Abs: 5.5 10*3/uL (ref 1.7–7.7)
Neutrophils Relative %: 69 % (ref 43–77)
RBC: 3.99 MIL/uL (ref 3.87–5.11)

## 2013-03-02 LAB — URINALYSIS, ROUTINE W REFLEX MICROSCOPIC
Bilirubin Urine: NEGATIVE
Hgb urine dipstick: NEGATIVE
Ketones, ur: NEGATIVE mg/dL
Specific Gravity, Urine: 1.015 (ref 1.005–1.030)
pH: 7 (ref 5.0–8.0)

## 2013-03-02 LAB — BASIC METABOLIC PANEL
BUN: 33 mg/dL — ABNORMAL HIGH (ref 6–23)
Chloride: 100 mEq/L (ref 96–112)
Creatinine, Ser: 1.38 mg/dL — ABNORMAL HIGH (ref 0.50–1.10)
Glucose, Bld: 130 mg/dL — ABNORMAL HIGH (ref 70–99)
Potassium: 3.6 mEq/L (ref 3.5–5.1)

## 2013-03-02 MED ORDER — DOCUSATE SODIUM 100 MG PO CAPS: 100.0000 mg | ORAL_CAPSULE | Freq: Two times a day (BID) | ORAL | Status: DC

## 2013-03-02 MED ORDER — POLYETHYLENE GLYCOL 3350 17 GM/SCOOP PO POWD: 17.0000 g | Freq: Two times a day (BID) | ORAL | Status: DC

## 2013-03-02 NOTE — ED Provider Notes (Signed)
CSN: [id number]     Arrival date & time 03/02/13  1253 History  This chart was scribed for Vida Roller, MD by Quintella Reichert, ED scribe.  This patient was seen in room APAH6/APAH6 and the patient's care was started at 2:05 PM.  Chief Complaint  Patient presents with  . Abdominal Pain    The history is provided by the patient. No language interpreter was used.    HPI Comments: [name] is a 79 y.o. female with h/o diverticulitis, pancreatitis, gastritis S/P endoscopy, urinary retention and DM who presents to the Emergency Department complaining of periumbilical pain that began this morning.  Pain is chronic and has been ongoing intermittently for several years.  Pt states she has some pain every day.  It is not aggravated or relieved by anything.  It is not associated with any other symptoms.  Pt has been diagnosed with diverticulitis and has had abdominal surgery.  She walks with a walker.  She lives at home with her daughter.     Past Medical History  Diagnosis Date  . Urinary retention   . Gastritis   . Esophageal ulcer   . Salmonella sepsis   . Degenerative disk disease   . Spinal stenosis   . Asthma   . COPD (chronic obstructive pulmonary disease)   . Diabetes mellitus, type 2   . Diverticulosis of colon   . GERD (gastroesophageal reflux disease)   . Hypertension   . Osteoarthritis   . Hiatal hernia   . Pancreatitis   . S/P endoscopy 2009    Dr. Darrick Penna: gastritis  . Diverticulitis   . S/P endoscopy March 2012    Dr. Jena Gauss: NSAID induced ulcer    Past Surgical History  Procedure Laterality Date  . Cervical fusion    . Cholecystectomy      Family History  Problem Relation Age of Onset  . Diabetes Mother     History  Substance Use Topics  . Smoking status: Never Smoker   . Smokeless tobacco: Never Used  . Alcohol Use: No    OB History   Grav Para Term Preterm Abortions TAB SAB Ect Mult Living                  Review of Systems  All  other systems reviewed and are negative.     Allergies  Review of patient's allergies indicates no known allergies.  Home Medications   Current Outpatient Rx  Name  Route  Sig  Dispense  Refill  . Calcium Citrate-Vitamin D 200-250 MG-UNIT TABS   Oral   Take 1 tablet by mouth daily.         . ferrous sulfate 325 (65 FE) MG tablet   Oral   Take 325 mg by mouth daily with breakfast.         . furosemide (LASIX) 40 MG tablet   Oral   Take 40 mg by mouth daily as needed for fluid.         . montelukast (SINGULAIR) 10 MG tablet   Oral   Take 10 mg by mouth at bedtime.           . Multiple Vitamin (DAILY VITAMIN PO)   Oral   Take 1 tablet by mouth daily.         . pantoprazole (PROTONIX) 40 MG tablet   Oral   Take 40 mg by mouth daily.          . solifenacin (  VESICARE) 5 MG tablet   Oral   Take 5 mg by mouth daily.         . theophylline (THEODUR) 200 MG 12 hr tablet   Oral   Take 200 mg by mouth daily.          Marland Kitchen zolpidem (AMBIEN) 5 MG tablet   Oral   Take 5 mg by mouth at bedtime as needed.           . Calcium Carbonate (CALCIUM 600 PO)   Oral   Take 600 mg by mouth daily.         Marland Kitchen docusate sodium (COLACE) 100 MG capsule   Oral   Take 1 capsule (100 mg total) by mouth every 12 (twelve) hours.   30 capsule   0   . fluticasone-salmeterol (ADVAIR HFA) 45-21 MCG/ACT inhaler   Inhalation   Inhale 2 puffs into the lungs 2 (two) times daily.           Marland Kitchen HYDROcodone-acetaminophen (NORCO/VICODIN) 5-325 MG per tablet   Oral   Take 1 tablet by mouth every 6 (six) hours as needed for pain.         Marland Kitchen lidocaine (LIDODERM) 5 %   Transdermal   Place 1 patch onto the skin daily. Remove & Discard patch within 12 hours or as directed by MD          . meloxicam (MOBIC) 7.5 MG tablet   Oral   Take 7.5 mg by mouth daily.           . methocarbamol (ROBAXIN) 500 MG tablet   Oral   Take 500 mg by mouth at bedtime.         Marland Kitchen omeprazole  (PRILOSEC) 20 MG capsule   Oral   Take 20 mg by mouth daily.           . ondansetron (ZOFRAN-ODT) 8 MG disintegrating tablet   Oral   Take 8 mg by mouth every 8 (eight) hours as needed for nausea.         . polyethylene glycol powder (GLYCOLAX/MIRALAX) powder   Oral   Take 17 g by mouth 2 (two) times daily. Until daily soft stools  OTC   255 g   0   . potassium chloride (K-DUR) 10 MEQ tablet   Oral   Take 10 mEq by mouth daily.         . pramoxine (PROCTOFOAM) 1 % foam   Rectal   Place 1 application rectally 3 (three) times daily as needed for hemorrhoids.          BP 147/65  Pulse 89  Temp(Src) 98.4 F (36.9 C) (Oral)  Resp 18  SpO2 94%  Physical Exam  Nursing note and vitals reviewed. Constitutional: She is oriented to person, place, and time. She appears well-developed and well-nourished. No distress.  HENT:  Head: Normocephalic and atraumatic.  Mouth/Throat: Oropharynx is clear and moist.  Eyes: Conjunctivae are normal. Pupils are equal, round, and reactive to light. No scleral icterus.  Neck: Neck supple.  Cardiovascular: Normal rate, regular rhythm, normal heart sounds and intact distal pulses.   No murmur heard. Pulmonary/Chest: Effort normal and breath sounds normal. No stridor. No respiratory distress. She has no rales.  Abdominal: Soft. Bowel sounds are normal. She exhibits no distension and no mass. There is no tenderness. There is no guarding.  Very soft Non-focal  Musculoskeletal: Normal range of motion.  Neurological: She is alert and oriented to  person, place, and time.  Skin: Skin is warm and dry. No rash noted.  Psychiatric: She has a normal mood and affect. Her behavior is normal.    ED Course  Procedures (including critical care time)  DIAGNOSTIC STUDIES: Oxygen Saturation is 94% on room air, adequate by my interpretation.    COORDINATION OF CARE: 2:08 PM: Discussed treatment plan which includes labs and imaging.  Pt and family  expressed understanding and agreed to plan.   Labs Review Labs Reviewed  BASIC METABOLIC PANEL - Abnormal; Notable for the following:    Glucose, Bld 130 (*)    BUN 33 (*)    Creatinine, Ser 1.38 (*)    GFR calc non Af Amer 31 (*)    GFR calc Af Amer 36 (*)    All other components within normal limits  CBC WITH DIFFERENTIAL  URINALYSIS, ROUTINE W REFLEX MICROSCOPIC    Imaging Review Dg Abd 1 View  03/02/2013   CLINICAL DATA:  Abdominal pain and constipation  EXAM: ABDOMEN - 1 VIEW  COMPARISON:  July 27, 2010  FINDINGS: There is moderate stool in the colon. The bowel gas pattern is normal. No obstruction or free air is appreciable on this supine examination.  There are multiple presumed phleboliths in the pelvis. There is a focal calcification in the right upper abdomen which may represent of renal artery calcification. There are surgical clips in the gallbladder fossa region.  IMPRESSION:  The bowel gas pattern is unremarkable. Moderate stool.   Electronically Signed   By: Bretta Bang M.D.   On: 03/02/2013 14:45    EKG Interpretation   None       MDM   1. Abdominal pain   2. Constipation    Again, the patient has chronic abdominal pain, it seems to flare up intermittently and that she does have a history of diverticulitis she states that she has had infrequent bowel movements which she states is because of the pain medication (Norco) which she has been taking recently. Her exam is unremarkable, her laboratory workup is unremarkable, her x-ray does show a fair-sized stool burden.  I will give her stool softeners, discussed this with the family members who are all in agreement with the discharge plan.  Meds given in ED:  Medications - No data to display  New Prescriptions   DOCUSATE SODIUM (COLACE) 100 MG CAPSULE    Take 1 capsule (100 mg total) by mouth every 12 (twelve) hours.   POLYETHYLENE GLYCOL POWDER (GLYCOLAX/MIRALAX) POWDER    Take 17 g by mouth 2 (two) times  daily. Until daily soft stools  OTC      I personally performed the services described in this documentation, which was scribed in my presence. The recorded information has been reviewed and is accurate.      Vida Roller, MD 03/02/13 1537

## 2013-03-02 NOTE — ED Notes (Signed)
Pt caregiver called EMS for possible stroke due to pt not responding to her. Pt said she didn't respond bc she had snuff in her mouth. Pt arrived alert/oriented. Nad. Pt c/o pain around navel area since 9am. Denies n/v/d. Color wnl. Mm wet. Arm drift neg, smile symmetrical, leg and hand strength equal. Pt forgetful. Unable to give number or describe pain. No s/s of pain at this time. abd slightly hard and distended. Poor historian.

## 2013-03-02 NOTE — ED Notes (Signed)
cbg in route 188

## 2013-08-18 ENCOUNTER — Other Ambulatory Visit (HOSPITAL_COMMUNITY): Payer: PRIVATE HEALTH INSURANCE

## 2013-08-19 ENCOUNTER — Other Ambulatory Visit (HOSPITAL_COMMUNITY): Payer: PRIVATE HEALTH INSURANCE

## 2013-08-23 ENCOUNTER — Ambulatory Visit (HOSPITAL_COMMUNITY): Payer: PRIVATE HEALTH INSURANCE

## 2013-08-28 DIAGNOSIS — D472 Monoclonal gammopathy: Secondary | ICD-10-CM | POA: Insufficient documentation

## 2013-09-06 ENCOUNTER — Encounter (HOSPITAL_COMMUNITY): Payer: PRIVATE HEALTH INSURANCE | Attending: Hematology and Oncology

## 2013-09-06 DIAGNOSIS — J4489 Other specified chronic obstructive pulmonary disease: Secondary | ICD-10-CM | POA: Insufficient documentation

## 2013-09-06 DIAGNOSIS — M1990 Unspecified osteoarthritis, unspecified site: Secondary | ICD-10-CM | POA: Insufficient documentation

## 2013-09-06 DIAGNOSIS — I10 Essential (primary) hypertension: Secondary | ICD-10-CM | POA: Insufficient documentation

## 2013-09-06 DIAGNOSIS — D472 Monoclonal gammopathy: Secondary | ICD-10-CM | POA: Insufficient documentation

## 2013-09-06 DIAGNOSIS — E119 Type 2 diabetes mellitus without complications: Secondary | ICD-10-CM | POA: Insufficient documentation

## 2013-09-06 DIAGNOSIS — K5730 Diverticulosis of large intestine without perforation or abscess without bleeding: Secondary | ICD-10-CM | POA: Insufficient documentation

## 2013-09-06 DIAGNOSIS — J449 Chronic obstructive pulmonary disease, unspecified: Secondary | ICD-10-CM | POA: Insufficient documentation

## 2013-09-06 DIAGNOSIS — K219 Gastro-esophageal reflux disease without esophagitis: Secondary | ICD-10-CM | POA: Insufficient documentation

## 2013-09-06 LAB — CBC WITH DIFFERENTIAL/PLATELET
BASOS PCT: 0 % (ref 0–1)
Basophils Absolute: 0 10*3/uL (ref 0.0–0.1)
Eosinophils Absolute: 0.2 10*3/uL (ref 0.0–0.7)
Eosinophils Relative: 2 % (ref 0–5)
HCT: 40.1 % (ref 36.0–46.0)
HEMOGLOBIN: 13.5 g/dL (ref 12.0–15.0)
LYMPHS PCT: 35 % (ref 12–46)
Lymphs Abs: 2.6 10*3/uL (ref 0.7–4.0)
MCH: 33.8 pg (ref 26.0–34.0)
MCHC: 33.7 g/dL (ref 30.0–36.0)
MCV: 100.5 fL — ABNORMAL HIGH (ref 78.0–100.0)
MONOS PCT: 7 % (ref 3–12)
Monocytes Absolute: 0.5 10*3/uL (ref 0.1–1.0)
NEUTROS ABS: 4.1 10*3/uL (ref 1.7–7.7)
NEUTROS PCT: 56 % (ref 43–77)
Platelets: 204 10*3/uL (ref 150–400)
RBC: 3.99 MIL/uL (ref 3.87–5.11)
RDW: 14.2 % (ref 11.5–15.5)
WBC: 7.3 10*3/uL (ref 4.0–10.5)

## 2013-09-06 LAB — COMPREHENSIVE METABOLIC PANEL
ALT: 11 U/L (ref 0–35)
AST: 22 U/L (ref 0–37)
Albumin: 4.1 g/dL (ref 3.5–5.2)
Alkaline Phosphatase: 59 U/L (ref 39–117)
BUN: 46 mg/dL — ABNORMAL HIGH (ref 6–23)
CALCIUM: 10.4 mg/dL (ref 8.4–10.5)
CHLORIDE: 99 meq/L (ref 96–112)
CO2: 31 meq/L (ref 19–32)
Creatinine, Ser: 1.4 mg/dL — ABNORMAL HIGH (ref 0.50–1.10)
GFR, EST AFRICAN AMERICAN: 35 mL/min — AB (ref >90)
GFR, EST NON AFRICAN AMERICAN: 30 mL/min — AB (ref >90)
GLUCOSE: 178 mg/dL — AB (ref 70–99)
POTASSIUM: 3.9 meq/L (ref 3.7–5.3)
Sodium: 144 mEq/L (ref 137–147)
TOTAL PROTEIN: 7.3 g/dL (ref 6.0–8.3)
Total Bilirubin: 0.3 mg/dL (ref 0.3–1.2)

## 2013-09-06 NOTE — Progress Notes (Signed)
Labs drawn today for cbc/diff,cmp,kllc,mm 

## 2013-09-07 LAB — KAPPA/LAMBDA LIGHT CHAINS
Kappa free light chain: 7.7 mg/dL — ABNORMAL HIGH (ref 0.33–1.94)
Kappa, lambda light chain ratio: 2.19 — ABNORMAL HIGH (ref 0.26–1.65)
Lambda free light chains: 3.51 mg/dL — ABNORMAL HIGH (ref 0.57–2.63)

## 2013-09-12 LAB — MULTIPLE MYELOMA PANEL, SERUM
ALBUMIN ELP: 58.6 % (ref 55.8–66.1)
ALPHA-1-GLOBULIN: 5.9 % — AB (ref 2.9–4.9)
ALPHA-2-GLOBULIN: 12.5 % — AB (ref 7.1–11.8)
Beta 2: 3.8 % (ref 3.2–6.5)
Beta Globulin: 6.1 % (ref 4.7–7.2)
GAMMA GLOBULIN: 13.1 % (ref 11.1–18.8)
IGG (IMMUNOGLOBIN G), SERUM: 880 mg/dL (ref 690–1700)
IgA: 328 mg/dL (ref 69–380)
IgM, Serum: 23 mg/dL — ABNORMAL LOW (ref 52–322)
M-Spike, %: 0.34 g/dL
Total Protein: 7.2 g/dL (ref 6.0–8.3)

## 2013-09-13 ENCOUNTER — Encounter (HOSPITAL_BASED_OUTPATIENT_CLINIC_OR_DEPARTMENT_OTHER): Payer: PRIVATE HEALTH INSURANCE

## 2013-09-13 ENCOUNTER — Encounter (HOSPITAL_COMMUNITY): Payer: Self-pay

## 2013-09-13 VITALS — BP 126/73 | HR 75 | Temp 97.4°F | Resp 18 | Wt 97.1 lb

## 2013-09-13 DIAGNOSIS — D472 Monoclonal gammopathy: Secondary | ICD-10-CM

## 2013-09-13 NOTE — Progress Notes (Signed)
[facility]  OFFICE PROGRESS NOTE  [name], MD [address] [place] [address]  DIAGNOSIS: MGUS (monoclonal gammopathy of unknown significance) - Plan: CBC with Differential, Comprehensive metabolic panel, Lactate dehydrogenase, Beta 2 microglobuline, serum, Multiple myeloma panel, serum, Kappa/lambda light chains  Chief Complaint  Patient presents with  . MGUS   CURRENT THERAPY: Watchful expectation and surveillance of IgG kappa MGUS  INTERVAL HISTORY: [name] 80 y.o. female returns for followup of IgG kappa MGUS. The patient lives with her daughter. Her appetite is good with no nausea, vomiting, diarrhea, constipation, or incontinence. She is losing her false teeth and has had a difficult time eating solid foods as a result. She denies any PND, orthopnea, palpitations, headache, but is hard of hearing. She denies any lower extremity swelling or redness, chest pain, headache, or seizures. Bone pain is controlled with hydrocodone and Robaxin.  MEDICAL HISTORY: Past Medical History  Diagnosis Date  . Urinary retention   . Gastritis   . Esophageal ulcer   . Salmonella sepsis   . Degenerative disk disease   . Spinal stenosis   . Asthma   . COPD (chronic obstructive pulmonary disease)   . Diabetes mellitus, type 2   . Diverticulosis of colon   . GERD (gastroesophageal reflux disease)   . Hypertension   . Osteoarthritis   . Hiatal hernia   . Pancreatitis   . S/P endoscopy 2009    Dr. Oneida Alar: gastritis  . Diverticulitis   . S/P endoscopy March 2012    Dr. Gala Romney: NSAID induced ulcer    INTERIM HISTORY: has DIABETES MELLITUS, TYPE II; HYPERTENSION; ASTHMA; COPD; GERD; DIVERTICULOSIS, COLON; OSTEOARTHRITIS; PANCREATITIS, HX OF; PUD (peptic ulcer disease); and MGUS (monoclonal gammopathy of unknown significance) on her problem list.    ALLERGIES:  has No Known  Allergies.  MEDICATIONS: has a current medication list which includes the following prescription(s): calcium carbonate, calcium citrate-vitamin d, docusate sodium, ferrous sulfate, fluticasone-salmeterol, furosemide, hydrocodone-acetaminophen, lidocaine, meloxicam, methocarbamol, montelukast, multiple vitamin, omeprazole, ondansetron, pantoprazole, polyethylene glycol powder, potassium chloride, pramoxine, solifenacin, theophylline, and zolpidem.  SURGICAL HISTORY:  Past Surgical History  Procedure Laterality Date  . Cervical fusion    . Cholecystectomy      FAMILY HISTORY: family history includes Diabetes in her mother.  SOCIAL HISTORY:  reports that she has never smoked. She has never used smokeless tobacco. She reports that she does not drink alcohol or use illicit drugs.  REVIEW OF SYSTEMS:  Other than that discussed above is noncontributory.  PHYSICAL EXAMINATION: ECOG PERFORMANCE STATUS: 2 - Symptomatic, <50% confined to bed  Blood pressure 126/73, pulse 75, temperature 97.4 F (36.3 C), temperature source Oral, resp. rate 18, weight 97 lb 1.6 oz (44.044 kg).  GENERAL:alert, no distress and comfortable. Frail but in no acute distress. SKIN: skin color, texture, turgor are normal, no rashes or significant lesions EYES: PERLA; Conjunctiva are pink and non-injected, sclera clear SINUSES: No redness or tenderness over maxillary or ethmoid sinuses OROPHARYNX:no exudate, no erythema on lips, buccal mucosa, or tongue. Edentulous. NECK: supple, thyroid normal size, non-tender, without nodularity. No masses CHEST: Increased AP diameter with no breast masses. LYMPH:  no palpable lymphadenopathy in the cervical, axillary or inguinal LUNGS: clear to auscultation and percussion with normal breathing effort HEART: regular rate & rhythm and no murmurs. ABDOMEN:abdomen soft, non-tender and normal bowel sounds MUSCULOSKELETAL:no cyanosis of digits and no clubbing. Range  of motion normal.   NEURO: alert & oriented x 3 with fluent speech, no focal motor/sensory deficits   LABORATORY DATA: Infusion on 09/06/2013  Component Date Value Ref Range Status  . WBC 09/06/2013 7.3  4.0 - 10.5 K/uL Final  . RBC 09/06/2013 3.99  3.87 - 5.11 MIL/uL Final  . Hemoglobin 09/06/2013 13.5  12.0 - 15.0 g/dL Final  . HCT 09/06/2013 40.1  36.0 - 46.0 % Final  . MCV 09/06/2013 100.5* 78.0 - 100.0 fL Final  . MCH 09/06/2013 33.8  26.0 - 34.0 pg Final  . MCHC 09/06/2013 33.7  30.0 - 36.0 g/dL Final  . RDW 09/06/2013 14.2  11.5 - 15.5 % Final  . Platelets 09/06/2013 204  150 - 400 K/uL Final  . Neutrophils Relative % 09/06/2013 56  43 - 77 % Final  . Neutro Abs 09/06/2013 4.1  1.7 - 7.7 K/uL Final  . Lymphocytes Relative 09/06/2013 35  12 - 46 % Final  . Lymphs Abs 09/06/2013 2.6  0.7 - 4.0 K/uL Final  . Monocytes Relative 09/06/2013 7  3 - 12 % Final  . Monocytes Absolute 09/06/2013 0.5  0.1 - 1.0 K/uL Final  . Eosinophils Relative 09/06/2013 2  0 - 5 % Final  . Eosinophils Absolute 09/06/2013 0.2  0.0 - 0.7 K/uL Final  . Basophils Relative 09/06/2013 0  0 - 1 % Final  . Basophils Absolute 09/06/2013 0.0  0.0 - 0.1 K/uL Final  . Sodium 09/06/2013 144  137 - 147 mEq/L Final  . Potassium 09/06/2013 3.9  3.7 - 5.3 mEq/L Final  . Chloride 09/06/2013 99  96 - 112 mEq/L Final  . CO2 09/06/2013 31  19 - 32 mEq/L Final  . Glucose, Bld 09/06/2013 178* 70 - 99 mg/dL Final  . BUN 09/06/2013 46* 6 - 23 mg/dL Final  . Creatinine, Ser 09/06/2013 1.40* 0.50 - 1.10 mg/dL Final  . Calcium 09/06/2013 10.4  8.4 - 10.5 mg/dL Final  . Total Protein 09/06/2013 7.3  6.0 - 8.3 g/dL Final  . Albumin 09/06/2013 4.1  3.5 - 5.2 g/dL Final  . AST 09/06/2013 22  0 - 37 U/L Final  . ALT 09/06/2013 11  0 - 35 U/L Final  . Alkaline Phosphatase 09/06/2013 59  39 - 117 U/L Final  . Total Bilirubin 09/06/2013 0.3  0.3 - 1.2 mg/dL Final  . GFR calc non Af Amer 09/06/2013 30* >90 mL/min Final  . GFR calc Af Amer 09/06/2013  35* >90 mL/min Final   Comment: (NOTE)                          The eGFR has been calculated using the CKD EPI equation.                          This calculation has not been validated in all clinical situations.                          eGFR's persistently <90 mL/min signify possible Chronic Kidney                          Disease.  . Total Protein 09/06/2013 7.2  6.0 - 8.3 g/dL Final  . Albumin ELP 09/06/2013 58.6  55.8 - 66.1 % Final  . Alpha-1-Globulin 09/06/2013 5.9* 2.9 - 4.9 % Final  .  Alpha-2-Globulin 09/06/2013 12.5* 7.1 - 11.8 % Final  . Beta Globulin 09/06/2013 6.1  4.7 - 7.2 % Final  . Beta 2 09/06/2013 3.8  3.2 - 6.5 % Final  . Gamma Globulin 09/06/2013 13.1  11.1 - 18.8 % Final  . M-Spike, % 09/06/2013 0.34   Final  . SPE Interp. 09/06/2013 (NOTE)   Final   Comment: A restricted band consistent with monoclonal protein is present.                          The monoclonal protein peak accounts for 0.34 g/dL of the total                          0.94 g/dL of protein in the gamma region.                          Results are consistent with SPE performed on 01/27/2013  Reviewed by                          Odis Hollingshead, MD, PhD, FCAP (Electronic Signature on File)  . Comment 09/06/2013 (NOTE)   Final   Comment: ---------------                          Serum protein electrophoresis is a useful screening procedure in the                          detection of various pathophysiologic states such as inflammation,                          gammopathies, protein loss and other dysproteinemias.  Immunofixation                          electrophoresis (IFE) is a more sensitive technique for the                          identification of M-proteins found in patients with monoclonal                          gammopathy of unknown significance (MGUS), amyloidosis, early or                          treated myeloma or macroglobulinemia, solitary plasmacytoma or                           extramedullary plasmacytoma.  . IgG (Immunoglobin G), Serum 09/06/2013 880  690 - 1700 mg/dL Final  . IgA 09/06/2013 328  69 - 380 mg/dL Final  . IgM, Serum 09/06/2013 23* 52 - 322 mg/dL Final  . Immunofix Electr Int 09/06/2013 (NOTE)   Final   Comment: Monoclonal IgG kappa protein is present.                          Reviewed by Odis Hollingshead, MD, PhD, FCAP (Electronic Signature on  File)                          Performed at [facility]  . Kappa free light chain 09/06/2013 7.70* 0.33 - 1.94 mg/dL Final  . Lamda free light chains 09/06/2013 3.51* 0.57 - 2.63 mg/dL Final  . Kappa, lamda light chain ratio 09/06/2013 2.19* 0.26 - 1.65 Final   Performed at [facility]: Peripheral smear failed to reveal evidence of Rouleaux formation  Urinalysis    Component Value Date/Time   COLORURINE YELLOW 03/02/2013 [place] 03/02/2013 1324   LABSPEC 1.015 03/02/2013 1324   PHURINE 7.0 03/02/2013 1324   GLUCOSEU NEGATIVE 03/02/2013 [place] 03/02/2013 [place] 03/02/2013 1324   KETONESUR NEGATIVE 03/02/2013 1324   PROTEINUR NEGATIVE 03/02/2013 1324   UROBILINOGEN 0.2 03/02/2013 1324   NITRITE NEGATIVE 03/02/2013 1324   LEUKOCYTESUR NEGATIVE 03/02/2013 1324    RADIOGRAPHIC STUDIES: No results found.  ASSESSMENT:  #1. IgG kappa MGUS, slightly worsening kappa/lambda ratio 1.97 versus 2.19 from 7 months ago. #2. Allergic rhinitis, controlled. #3. Gastroesophageal reflux disease, on treatment. #4. Ulcerative proctitis, controlled with Proctofoam #5. Chronic obstructive pulmonary disease, stable.   PLAN:  #1. In the presence of her daughter, the patient was reassured. #2. Followup in one year with CBC, chem profile, myeloma panel with light chains.   All questions were answered. The patient knows to call the clinic with any problems, questions or concerns. We can certainly see the  patient much sooner if necessary.   I spent 25 minutes counseling the patient face to face. The total time spent in the appointment was 30 minutes.    Farrel Gobble, MD 09/13/2013 10:58 AM  DISCLAIMER:  This note was dictated with voice recognition software.  Similar sounding words can inadvertently be transcribed inaccurately and may not be corrected upon review.

## 2013-09-13 NOTE — Patient Instructions (Signed)
[facility] Discharge Instructions  RECOMMENDATIONS MADE BY THE CONSULTANT AND ANY TEST RESULTS WILL BE SENT TO YOUR REFERRING PHYSICIAN. We will see you in 1 year for a doctor visit and labs. Please call for any questions or concerns.  Thank you for choosing [facility] to provide your oncology and hematology care.  To afford each patient quality time with our providers, please arrive at least 15 minutes before your scheduled appointment time.  With your help, our goal is to use those 15 minutes to complete the necessary work-up to ensure our physicians have the information they need to help with your evaluation and healthcare recommendations.    Effective January 1st, 2014, we ask that you re-schedule your appointment with our physicians should you arrive 10 or more minutes late for your appointment.  We strive to give you quality time with our providers, and arriving late affects you and other patients whose appointments are after yours.    Again, thank you for choosing [facility].  Our hope is that these requests will decrease the amount of time that you wait before being seen by our physicians.       _____________________________________________________________  Should you have questions after your visit to [facility], please contact our office at [phone] between the hours of 8:30 a.m. and 5:00 p.m.  Voicemails left after 4:30 p.m. will not be returned until the following business day.  For prescription refill requests, have your pharmacy contact our office with your prescription refill request.

## 2013-12-14 ENCOUNTER — Emergency Department (HOSPITAL_COMMUNITY)
Admission: EM | Admit: 2013-12-14 | Discharge: 2013-12-14 | Disposition: A | Discharge status: Other Acute Inpatient Hospital | Payer: PRIVATE HEALTH INSURANCE | Attending: Emergency Medicine | Admitting: Emergency Medicine

## 2013-12-14 ENCOUNTER — Encounter (HOSPITAL_COMMUNITY): Payer: Self-pay | Admitting: Emergency Medicine

## 2013-12-14 ENCOUNTER — Emergency Department (HOSPITAL_COMMUNITY): Payer: PRIVATE HEALTH INSURANCE

## 2013-12-14 DIAGNOSIS — M1990 Unspecified osteoarthritis, unspecified site: Secondary | ICD-10-CM | POA: Diagnosis not present

## 2013-12-14 DIAGNOSIS — K219 Gastro-esophageal reflux disease without esophagitis: Secondary | ICD-10-CM | POA: Insufficient documentation

## 2013-12-14 DIAGNOSIS — K5900 Constipation, unspecified: Secondary | ICD-10-CM

## 2013-12-14 DIAGNOSIS — E119 Type 2 diabetes mellitus without complications: Secondary | ICD-10-CM | POA: Insufficient documentation

## 2013-12-14 DIAGNOSIS — J4489 Other specified chronic obstructive pulmonary disease: Secondary | ICD-10-CM | POA: Insufficient documentation

## 2013-12-14 DIAGNOSIS — Z8739 Personal history of other diseases of the musculoskeletal system and connective tissue: Secondary | ICD-10-CM | POA: Insufficient documentation

## 2013-12-14 DIAGNOSIS — Z79899 Other long term (current) drug therapy: Secondary | ICD-10-CM | POA: Diagnosis not present

## 2013-12-14 DIAGNOSIS — Z8619 Personal history of other infectious and parasitic diseases: Secondary | ICD-10-CM | POA: Insufficient documentation

## 2013-12-14 DIAGNOSIS — R110 Nausea: Secondary | ICD-10-CM | POA: Insufficient documentation

## 2013-12-14 DIAGNOSIS — Z791 Long term (current) use of non-steroidal anti-inflammatories (NSAID): Secondary | ICD-10-CM | POA: Diagnosis not present

## 2013-12-14 DIAGNOSIS — IMO0002 Reserved for concepts with insufficient information to code with codable children: Secondary | ICD-10-CM | POA: Insufficient documentation

## 2013-12-14 DIAGNOSIS — R1032 Left lower quadrant pain: Secondary | ICD-10-CM | POA: Diagnosis present

## 2013-12-14 DIAGNOSIS — I10 Essential (primary) hypertension: Secondary | ICD-10-CM | POA: Insufficient documentation

## 2013-12-14 DIAGNOSIS — J449 Chronic obstructive pulmonary disease, unspecified: Secondary | ICD-10-CM | POA: Diagnosis not present

## 2013-12-14 DIAGNOSIS — Z9089 Acquired absence of other organs: Secondary | ICD-10-CM | POA: Insufficient documentation

## 2013-12-14 LAB — CBC WITH DIFFERENTIAL/PLATELET
BASOS ABS: 0 10*3/uL (ref 0.0–0.1)
BASOS PCT: 0 % (ref 0–1)
EOS PCT: 2 % (ref 0–5)
Eosinophils Absolute: 0.2 10*3/uL (ref 0.0–0.7)
HEMATOCRIT: 38 % (ref 36.0–46.0)
HEMOGLOBIN: 13 g/dL (ref 12.0–15.0)
Lymphocytes Relative: 20 % (ref 12–46)
Lymphs Abs: 1.6 10*3/uL (ref 0.7–4.0)
MCH: 33.9 pg (ref 26.0–34.0)
MCHC: 34.2 g/dL (ref 30.0–36.0)
MCV: 99.2 fL (ref 78.0–100.0)
MONO ABS: 0.5 10*3/uL (ref 0.1–1.0)
MONOS PCT: 7 % (ref 3–12)
NEUTROS ABS: 5.7 10*3/uL (ref 1.7–7.7)
Neutrophils Relative %: 71 % (ref 43–77)
Platelets: 162 10*3/uL (ref 150–400)
RBC: 3.83 MIL/uL — ABNORMAL LOW (ref 3.87–5.11)
RDW: 13.9 % (ref 11.5–15.5)
WBC: 8 10*3/uL (ref 4.0–10.5)

## 2013-12-14 LAB — COMPREHENSIVE METABOLIC PANEL
ALBUMIN: 3.5 g/dL (ref 3.5–5.2)
ALT: 10 U/L (ref 0–35)
ANION GAP: 9 (ref 5–15)
AST: 22 U/L (ref 0–37)
Alkaline Phosphatase: 56 U/L (ref 39–117)
BUN: 32 mg/dL — AB (ref 6–23)
CALCIUM: 9.5 mg/dL (ref 8.4–10.5)
CHLORIDE: 104 meq/L (ref 96–112)
CO2: 32 mEq/L (ref 19–32)
CREATININE: 1.06 mg/dL (ref 0.50–1.10)
GFR calc Af Amer: 49 mL/min — ABNORMAL LOW (ref >90)
GFR calc non Af Amer: 42 mL/min — ABNORMAL LOW (ref >90)
Glucose, Bld: 101 mg/dL — ABNORMAL HIGH (ref 70–99)
Potassium: 3.5 mEq/L — ABNORMAL LOW (ref 3.7–5.3)
Sodium: 145 mEq/L (ref 137–147)
Total Bilirubin: 0.3 mg/dL (ref 0.3–1.2)
Total Protein: 6.5 g/dL (ref 6.0–8.3)

## 2013-12-14 LAB — URINALYSIS, ROUTINE W REFLEX MICROSCOPIC
Bilirubin Urine: NEGATIVE
GLUCOSE, UA: NEGATIVE mg/dL
HGB URINE DIPSTICK: NEGATIVE
KETONES UR: NEGATIVE mg/dL
LEUKOCYTES UA: NEGATIVE
Nitrite: NEGATIVE
Protein, ur: NEGATIVE mg/dL
Specific Gravity, Urine: <1.005 — ABNORMAL LOW (ref 1.005–1.030)
Urobilinogen, UA: 0.2 mg/dL (ref 0.0–1.0)
pH: 5.5 (ref 5.0–8.0)

## 2013-12-14 MED ORDER — IOHEXOL 300 MG/ML  SOLN
80.0000 mL | Freq: Once | INTRAMUSCULAR | Status: AC | PRN
  Administered 2013-12-14: 80 mL via INTRAVENOUS

## 2013-12-14 MED ORDER — IOHEXOL 300 MG/ML  SOLN
50.0000 mL | Freq: Once | INTRAMUSCULAR | Status: AC | PRN
  Administered 2013-12-14: 50 mL via ORAL

## 2013-12-14 NOTE — ED Notes (Signed)
Pt drinking second bottle of contrast, tolerating well.

## 2013-12-14 NOTE — ED Notes (Addendum)
Pt finished drinking contrast, tolerated well. CT informed pt has finished drinking contrast. CT reported will take pt to CT in 1 hour per their protocol.

## 2013-12-14 NOTE — ED Notes (Signed)
Spoke with Kandra Nicolas, RN at [facility]. [facility] reported will pick up pt in 15 minutes.

## 2013-12-14 NOTE — ED Notes (Signed)
Pt drinking contrast, tolerating well.

## 2013-12-14 NOTE — ED Provider Notes (Signed)
CSN: [id number]     Arrival date & time 12/14/13  9983 History  This chart was scribed for [name] Diego, MD by Peyton Bottoms, ED Scribe. This patient was seen in room APA06/APA06 and the patient's care was started at 7:40 AM.   Chief Complaint  Patient presents with  . Abdominal Pain    lower abdominal pain   Patient is a 80 y.o. female presenting with abdominal pain. The history is provided by the patient. No language interpreter was used.  Abdominal Pain Pain location:  Suprapubic, LLQ and RLQ Pain severity:  Moderate Duration: today. Chronicity:  New Associated symptoms: nausea   Associated symptoms: no chest pain, no cough, no diarrhea, no fatigue, no hematuria and no vomiting   Risk factors: being elderly     HPI Comments: [name] is a 81 y.o. Female brought in from [place], who presents to the Emergency Department complaining of moderate lower abdominal pain onset today. Patient reports associated nausea, but denies vomiting.   Past Medical History  Diagnosis Date  . Urinary retention   . Gastritis   . Esophageal ulcer   . Salmonella sepsis   . Degenerative disk disease   . Spinal stenosis   . Asthma   . COPD (chronic obstructive pulmonary disease)   . Diabetes mellitus, type 2   . Diverticulosis of colon   . GERD (gastroesophageal reflux disease)   . Hypertension   . Osteoarthritis   . Hiatal hernia   . Pancreatitis   . S/P endoscopy 2009    Dr. Oneida Alar: gastritis  . Diverticulitis   . S/P endoscopy March 2012    Dr. Gala Romney: NSAID induced ulcer   Past Surgical History  Procedure Laterality Date  . Cervical fusion    . Cholecystectomy     Family History  Problem Relation Age of Onset  . Diabetes Mother    History  Substance Use Topics  . Smoking status: Never Smoker   . Smokeless tobacco: Never Used  . Alcohol Use: No   OB History   Grav Para Term Preterm Abortions TAB SAB Ect Mult Living                 Review of Systems   Constitutional: Negative for appetite change and fatigue.  HENT: Negative for congestion, ear discharge and sinus pressure.   Eyes: Negative for discharge.  Respiratory: Negative for cough.   Cardiovascular: Negative for chest pain.  Gastrointestinal: Positive for nausea and abdominal pain. Negative for vomiting and diarrhea.  Genitourinary: Negative for frequency and hematuria.  Musculoskeletal: Negative for back pain.  Skin: Negative for rash.  Neurological: Negative for seizures and headaches.  Psychiatric/Behavioral: Negative for hallucinations.    Allergies  Review of patient's allergies indicates no known allergies.  Home Medications   Prior to Admission medications   Medication Sig Start Date End Date Taking? Authorizing Provider  Calcium Citrate-Vitamin D 200-250 MG-UNIT TABS Take 1 tablet by mouth daily.   Yes Historical Provider, MD  docusate sodium (COLACE) 100 MG capsule Take 1 capsule (100 mg total) by mouth every 12 (twelve) hours. 03/02/13  Yes Johnna Acosta, MD  ferrous sulfate 325 (65 FE) MG tablet Take 325 mg by mouth daily with breakfast.   Yes Historical Provider, MD  fluticasone-salmeterol (ADVAIR HFA) 45-21 MCG/ACT inhaler Inhale 2 puffs into the lungs 2 (two) times daily.     Yes Historical Provider, MD  furosemide (LASIX) 40 MG tablet Take 40 mg by mouth  daily as needed for fluid.   Yes Historical Provider, MD  HYDROcodone-acetaminophen (NORCO/VICODIN) 5-325 MG per tablet Take 1 tablet by mouth every 6 (six) hours as needed for pain.   Yes Historical Provider, MD  montelukast (SINGULAIR) 10 MG tablet Take 10 mg by mouth at bedtime.     Yes Historical Provider, MD  Multiple Vitamin (DAILY VITAMIN PO) Take 1 tablet by mouth daily.   Yes Historical Provider, MD  pantoprazole (PROTONIX) 40 MG tablet Take 40 mg by mouth daily.    Yes Historical Provider, MD  potassium chloride (K-DUR) 10 MEQ tablet Take 10 mEq by mouth daily.   Yes Historical Provider, MD   solifenacin (VESICARE) 5 MG tablet Take 5 mg by mouth daily.   Yes Historical Provider, MD  theophylline (THEODUR) 200 MG 12 hr tablet Take 200 mg by mouth daily.    Yes Historical Provider, MD  zolpidem (AMBIEN) 5 MG tablet Take 5 mg by mouth at bedtime as needed.     Yes Historical Provider, MD  Calcium Carbonate (CALCIUM 600 PO) Take 600 mg by mouth daily.    Historical Provider, MD  lidocaine (LIDODERM) 5 % Place 1 patch onto the skin daily. Remove & Discard patch within 12 hours or as directed by MD     Historical Provider, MD  meloxicam (MOBIC) 7.5 MG tablet Take 7.5 mg by mouth daily.      Historical Provider, MD  methocarbamol (ROBAXIN) 500 MG tablet Take 500 mg by mouth at bedtime.    Historical Provider, MD  omeprazole (PRILOSEC) 20 MG capsule Take 20 mg by mouth daily.      Historical Provider, MD  ondansetron (ZOFRAN-ODT) 8 MG disintegrating tablet Take 8 mg by mouth every 8 (eight) hours as needed for nausea.    Historical Provider, MD  polyethylene glycol powder (GLYCOLAX/MIRALAX) powder Take 17 g by mouth 2 (two) times daily. Until daily soft stools  OTC 03/02/13   Johnna Acosta, MD  pramoxine (PROCTOFOAM) 1 % foam Place 1 application rectally 3 (three) times daily as needed for hemorrhoids.    Historical Provider, MD   Triage Vitals: BP 160/91  Pulse 70  Temp(Src) 97.6 F (36.4 C) (Oral)  Resp 16  Ht 5\' 2"  (1.575 m)  Wt 100 lb (45.36 kg)  BMI 18.29 kg/m2  SpO2 99% Physical Exam  Nursing note and vitals reviewed. Constitutional: She is oriented to person, place, and time. She appears well-developed.  HENT:  Head: Normocephalic.  Mouth/Throat: Mucous membranes are dry.  Eyes: Conjunctivae and EOM are normal. No scleral icterus.  Neck: Neck supple. No thyromegaly present.  Cardiovascular: Normal rate and regular rhythm.  Exam reveals no gallop and no friction rub.   No murmur heard. Pulmonary/Chest: No stridor. She has no wheezes. She has no rales. She exhibits no  tenderness.  Abdominal: She exhibits no distension. There is tenderness (moderate) in the right lower quadrant, suprapubic area and left lower quadrant. There is no rebound.  Musculoskeletal: Normal range of motion. She exhibits no edema.  Lymphadenopathy:    She has no cervical adenopathy.  Neurological: She is oriented to person, place, and time. She exhibits normal muscle tone. Coordination normal.  Skin: No rash noted. No erythema.  Psychiatric: She has a normal mood and affect. Her behavior is normal.    ED Course  Procedures (including critical care time)\  DIAGNOSTIC STUDIES: Oxygen Saturation is 99% on RA, Normal by my interpretation.    COORDINATION OF CARE: 7:43 AM-  Discussed plans to order diagnostic lab work. Pt advised of plan for treatment and pt agrees.    Labs Review Labs Reviewed  URINALYSIS, ROUTINE W REFLEX MICROSCOPIC - Abnormal; Notable for the following:    Specific Gravity, Urine <1.005 (*)    All other components within normal limits  CBC WITH DIFFERENTIAL - Abnormal; Notable for the following:    RBC 3.83 (*)    All other components within normal limits  COMPREHENSIVE METABOLIC PANEL - Abnormal; Notable for the following:    Potassium 3.5 (*)    Glucose, Bld 101 (*)    BUN 32 (*)    GFR calc non Af Amer 42 (*)    GFR calc Af Amer 49 (*)    All other components within normal limits    Imaging Review No results found.   EKG Interpretation None      MDM   Final diagnoses:  None    Constipation.  tx with mom.  And follow up   The chart was scribed for me under my direct supervision.  I personally performed the history, physical, and medical decision making and all procedures in the evaluation of this patient.[name] Diego, MD 12/14/13 [phone]

## 2013-12-14 NOTE — ED Notes (Signed)
Patient via [facility] c/o lower abdominal pain and general weakness. Patient states she can usually get up and move around on her own, but this morning she had to sit back down.

## 2013-12-14 NOTE — Discharge Instructions (Signed)
Take mild of magnesia for constipation and follow up with your md if not improving.

## 2013-12-14 NOTE — ED Notes (Signed)
Patient from [place] c/o lower abdominal pain and generalized weakness. Patient states she is ususlly able to get up and move around with a walker, but this morning she felt weak. Patient also states she is "always constipated" stating last bowel movement was yesterday and was "hard"  Patient is alert and oriented X4 and appropriate for age. Resting in a position of comfort with no needs voiced   #22g IV place to left forearm via [facility]

## 2014-02-01 ENCOUNTER — Emergency Department (HOSPITAL_COMMUNITY): Payer: PRIVATE HEALTH INSURANCE

## 2014-02-01 ENCOUNTER — Encounter (HOSPITAL_COMMUNITY): Payer: Self-pay | Admitting: Emergency Medicine

## 2014-02-01 ENCOUNTER — Inpatient Hospital Stay (HOSPITAL_COMMUNITY)
Admission: EM | Admit: 2014-02-01 | Discharge: 2014-02-09 | DRG: 444 | Disposition: A | Discharge status: Skilled Nursing Facility | Payer: PRIVATE HEALTH INSURANCE | Attending: Internal Medicine | Admitting: Internal Medicine

## 2014-02-01 DIAGNOSIS — R339 Retention of urine, unspecified: Secondary | ICD-10-CM

## 2014-02-01 DIAGNOSIS — Z681 Body mass index (BMI) 19 or less, adult: Secondary | ICD-10-CM

## 2014-02-01 DIAGNOSIS — I10 Essential (primary) hypertension: Secondary | ICD-10-CM | POA: Diagnosis present

## 2014-02-01 DIAGNOSIS — K219 Gastro-esophageal reflux disease without esophagitis: Secondary | ICD-10-CM | POA: Diagnosis present

## 2014-02-01 DIAGNOSIS — K8309 Other cholangitis: Secondary | ICD-10-CM | POA: Diagnosis present

## 2014-02-01 DIAGNOSIS — K8021 Calculus of gallbladder without cholecystitis with obstruction: Secondary | ICD-10-CM | POA: Diagnosis present

## 2014-02-01 DIAGNOSIS — K222 Esophageal obstruction: Secondary | ICD-10-CM | POA: Diagnosis present

## 2014-02-01 DIAGNOSIS — E119 Type 2 diabetes mellitus without complications: Secondary | ICD-10-CM | POA: Diagnosis present

## 2014-02-01 DIAGNOSIS — R109 Unspecified abdominal pain: Secondary | ICD-10-CM

## 2014-02-01 DIAGNOSIS — R1310 Dysphagia, unspecified: Secondary | ICD-10-CM | POA: Diagnosis present

## 2014-02-01 DIAGNOSIS — K8071 Calculus of gallbladder and bile duct without cholecystitis with obstruction: Principal | ICD-10-CM | POA: Diagnosis present

## 2014-02-01 DIAGNOSIS — Z833 Family history of diabetes mellitus: Secondary | ICD-10-CM

## 2014-02-01 DIAGNOSIS — G9340 Encephalopathy, unspecified: Secondary | ICD-10-CM | POA: Diagnosis not present

## 2014-02-01 DIAGNOSIS — J4489 Other specified chronic obstructive pulmonary disease: Secondary | ICD-10-CM | POA: Diagnosis present

## 2014-02-01 DIAGNOSIS — E43 Unspecified severe protein-calorie malnutrition: Secondary | ICD-10-CM | POA: Insufficient documentation

## 2014-02-01 DIAGNOSIS — K2990 Gastroduodenitis, unspecified, without bleeding: Secondary | ICD-10-CM | POA: Diagnosis present

## 2014-02-01 DIAGNOSIS — Z8719 Personal history of other diseases of the digestive system: Secondary | ICD-10-CM

## 2014-02-01 DIAGNOSIS — D472 Monoclonal gammopathy: Secondary | ICD-10-CM | POA: Diagnosis present

## 2014-02-01 DIAGNOSIS — M1990 Unspecified osteoarthritis, unspecified site: Secondary | ICD-10-CM | POA: Diagnosis present

## 2014-02-01 DIAGNOSIS — E039 Hypothyroidism, unspecified: Secondary | ICD-10-CM | POA: Diagnosis present

## 2014-02-01 DIAGNOSIS — K8033 Calculus of bile duct with acute cholangitis with obstruction: Secondary | ICD-10-CM

## 2014-02-01 DIAGNOSIS — I6350 Cerebral infarction due to unspecified occlusion or stenosis of unspecified cerebral artery: Secondary | ICD-10-CM | POA: Diagnosis not present

## 2014-02-01 DIAGNOSIS — K2970 Gastritis, unspecified, without bleeding: Secondary | ICD-10-CM | POA: Diagnosis present

## 2014-02-01 DIAGNOSIS — K279 Peptic ulcer, site unspecified, unspecified as acute or chronic, without hemorrhage or perforation: Secondary | ICD-10-CM

## 2014-02-01 DIAGNOSIS — K5730 Diverticulosis of large intestine without perforation or abscess without bleeding: Secondary | ICD-10-CM | POA: Diagnosis present

## 2014-02-01 DIAGNOSIS — K831 Obstruction of bile duct: Secondary | ICD-10-CM | POA: Diagnosis present

## 2014-02-01 DIAGNOSIS — J449 Chronic obstructive pulmonary disease, unspecified: Secondary | ICD-10-CM | POA: Diagnosis present

## 2014-02-01 DIAGNOSIS — K317 Polyp of stomach and duodenum: Secondary | ICD-10-CM

## 2014-02-01 DIAGNOSIS — E46 Unspecified protein-calorie malnutrition: Secondary | ICD-10-CM | POA: Diagnosis present

## 2014-02-01 DIAGNOSIS — R509 Fever, unspecified: Secondary | ICD-10-CM

## 2014-02-01 DIAGNOSIS — J45909 Unspecified asthma, uncomplicated: Secondary | ICD-10-CM

## 2014-02-01 HISTORY — DX: Hypothyroidism, unspecified: E03.9

## 2014-02-01 HISTORY — DX: Monoclonal gammopathy: D47.2

## 2014-02-01 LAB — CBC WITH DIFFERENTIAL/PLATELET
BASOS ABS: 0 10*3/uL (ref 0.0–0.1)
BASOS PCT: 0 % (ref 0–1)
Eosinophils Absolute: 0.1 10*3/uL (ref 0.0–0.7)
Eosinophils Relative: 1 % (ref 0–5)
HCT: 39.5 % (ref 36.0–46.0)
Hemoglobin: 13.4 g/dL (ref 12.0–15.0)
Lymphocytes Relative: 2 % — ABNORMAL LOW (ref 12–46)
Lymphs Abs: 0.3 10*3/uL — ABNORMAL LOW (ref 0.7–4.0)
MCH: 32.8 pg (ref 26.0–34.0)
MCHC: 33.9 g/dL (ref 30.0–36.0)
MCV: 96.8 fL (ref 78.0–100.0)
Monocytes Absolute: 1.2 10*3/uL — ABNORMAL HIGH (ref 0.1–1.0)
Monocytes Relative: 8 % (ref 3–12)
NEUTROS ABS: 12.5 10*3/uL — AB (ref 1.7–7.7)
NEUTROS PCT: 89 % — AB (ref 43–77)
PLATELETS: ADEQUATE 10*3/uL (ref 150–400)
RBC: 4.08 MIL/uL (ref 3.87–5.11)
RDW: 13.5 % (ref 11.5–15.5)
WBC: 14 10*3/uL — AB (ref 4.0–10.5)

## 2014-02-01 LAB — LIPASE, BLOOD: Lipase: 11 U/L (ref 11–59)

## 2014-02-01 LAB — BASIC METABOLIC PANEL
ANION GAP: 16 — AB (ref 5–15)
BUN: 26 mg/dL — ABNORMAL HIGH (ref 6–23)
CHLORIDE: 96 meq/L (ref 96–112)
CO2: 29 mEq/L (ref 19–32)
CREATININE: 1.02 mg/dL (ref 0.50–1.10)
Calcium: 9.6 mg/dL (ref 8.4–10.5)
GFR calc non Af Amer: 44 mL/min — ABNORMAL LOW (ref >90)
GFR, EST AFRICAN AMERICAN: 51 mL/min — AB (ref >90)
Glucose, Bld: 168 mg/dL — ABNORMAL HIGH (ref 70–99)
POTASSIUM: 4.1 meq/L (ref 3.7–5.3)
Sodium: 141 mEq/L (ref 137–147)

## 2014-02-01 LAB — URINALYSIS, ROUTINE W REFLEX MICROSCOPIC
Bilirubin Urine: NEGATIVE
Glucose, UA: NEGATIVE mg/dL
Hgb urine dipstick: NEGATIVE
Ketones, ur: NEGATIVE mg/dL
LEUKOCYTES UA: NEGATIVE
NITRITE: NEGATIVE
PROTEIN: NEGATIVE mg/dL
Specific Gravity, Urine: 1.01 (ref 1.005–1.030)
Urobilinogen, UA: 1 mg/dL (ref 0.0–1.0)
pH: 7 (ref 5.0–8.0)

## 2014-02-01 LAB — HEPATIC FUNCTION PANEL
ALK PHOS: 549 U/L — AB (ref 39–117)
ALT: 349 U/L — ABNORMAL HIGH (ref 0–35)
AST: 1058 U/L — ABNORMAL HIGH (ref 0–37)
Albumin: 3.5 g/dL (ref 3.5–5.2)
BILIRUBIN DIRECT: 1.2 mg/dL — AB (ref 0.0–0.3)
Indirect Bilirubin: 0.4 mg/dL (ref 0.3–0.9)
Total Bilirubin: 1.6 mg/dL — ABNORMAL HIGH (ref 0.3–1.2)
Total Protein: 7.2 g/dL (ref 6.0–8.3)

## 2014-02-01 MED ORDER — SODIUM CHLORIDE 0.9 % IV BOLUS (SEPSIS)
250.0000 mL | Freq: Once | INTRAVENOUS | Status: AC
  Administered 2014-02-01: 250 mL via INTRAVENOUS

## 2014-02-01 MED ORDER — CALCIUM CITRATE-VITAMIN D 200-250 MG-UNIT PO TABS: 1.0000 | ORAL_TABLET | Freq: Every day | ORAL | Status: DC

## 2014-02-01 MED ORDER — ONDANSETRON HCL 4 MG PO TABS: 4.0000 mg | ORAL_TABLET | Freq: Four times a day (QID) | ORAL | Status: DC | PRN

## 2014-02-01 MED ORDER — ONDANSETRON HCL 4 MG/2ML IJ SOLN
4.0000 mg | Freq: Once | INTRAMUSCULAR | Status: AC
  Administered 2014-02-01: 4 mg via INTRAVENOUS
  Filled 2014-02-01: qty 2

## 2014-02-01 MED ORDER — SODIUM CHLORIDE 0.9 % IV SOLN
3.0000 g | Freq: Once | INTRAVENOUS | Status: AC
  Administered 2014-02-01: 3 g via INTRAVENOUS
  Filled 2014-02-01: qty 3

## 2014-02-01 MED ORDER — SODIUM CHLORIDE 0.9 % IJ SOLN
INTRAMUSCULAR | Status: AC
  Filled 2014-02-01: qty 250

## 2014-02-01 MED ORDER — IOHEXOL 300 MG/ML  SOLN
50.0000 mL | Freq: Once | INTRAMUSCULAR | Status: AC | PRN
  Administered 2014-02-01: 50 mL via ORAL

## 2014-02-01 MED ORDER — SODIUM CHLORIDE 0.9 % IV SOLN
1.5000 g | Freq: Four times a day (QID) | INTRAVENOUS | Status: DC
  Filled 2014-02-01 (×3): qty 1.5

## 2014-02-01 MED ORDER — IOHEXOL 300 MG/ML  SOLN: 100.0000 mL | Freq: Once | INTRAMUSCULAR | Status: DC | PRN

## 2014-02-01 MED ORDER — HEPARIN SODIUM (PORCINE) 5000 UNIT/ML IJ SOLN
5000.0000 [IU] | Freq: Three times a day (TID) | INTRAMUSCULAR | Status: DC
  Administered 2014-02-02: 5000 [IU] via SUBCUTANEOUS
  Filled 2014-02-01: qty 1

## 2014-02-01 MED ORDER — SODIUM CHLORIDE 0.9 % IV SOLN
INTRAVENOUS | Status: DC
  Administered 2014-02-01 – 2014-02-03 (×4): via INTRAVENOUS

## 2014-02-01 MED ORDER — LEVOTHYROXINE SODIUM 25 MCG PO TABS
25.0000 ug | ORAL_TABLET | Freq: Every day | ORAL | Status: DC
  Administered 2014-02-02 – 2014-02-09 (×8): 25 ug via ORAL
  Filled 2014-02-01 (×8): qty 1

## 2014-02-01 MED ORDER — MORPHINE SULFATE 2 MG/ML IJ SOLN
1.0000 mg | INTRAMUSCULAR | Status: DC | PRN
  Administered 2014-02-02 – 2014-02-03 (×2): 1 mg via INTRAVENOUS
  Filled 2014-02-01 (×3): qty 1

## 2014-02-01 MED ORDER — SODIUM CHLORIDE 0.9 % IJ SOLN
3.0000 mL | Freq: Two times a day (BID) | INTRAMUSCULAR | Status: DC
  Administered 2014-02-02 – 2014-02-09 (×6): 3 mL via INTRAVENOUS

## 2014-02-01 MED ORDER — SODIUM CHLORIDE 0.9 % IJ SOLN
INTRAMUSCULAR | Status: AC
  Filled 2014-02-01: qty 15

## 2014-02-01 MED ORDER — DARIFENACIN HYDROBROMIDE ER 7.5 MG PO TB24
7.5000 mg | ORAL_TABLET | Freq: Every day | ORAL | Status: DC
  Administered 2014-02-03 – 2014-02-09 (×6): 7.5 mg via ORAL
  Filled 2014-02-01 (×7): qty 1

## 2014-02-01 MED ORDER — MONTELUKAST SODIUM 10 MG PO TABS
10.0000 mg | ORAL_TABLET | Freq: Every morning | ORAL | Status: DC
  Administered 2014-02-03 – 2014-02-08 (×5): 10 mg via ORAL
  Filled 2014-02-01 (×7): qty 1

## 2014-02-01 MED ORDER — IOHEXOL 300 MG/ML  SOLN
80.0000 mL | Freq: Once | INTRAMUSCULAR | Status: AC | PRN
  Administered 2014-02-01: 80 mL via INTRAVENOUS

## 2014-02-01 MED ORDER — ZOLPIDEM TARTRATE 5 MG PO TABS: 5.0000 mg | ORAL_TABLET | Freq: Every evening | ORAL | Status: DC | PRN

## 2014-02-01 MED ORDER — PANTOPRAZOLE SODIUM 40 MG PO TBEC
40.0000 mg | DELAYED_RELEASE_TABLET | Freq: Every day | ORAL | Status: DC
  Administered 2014-02-03: 40 mg via ORAL
  Filled 2014-02-01: qty 1

## 2014-02-01 MED ORDER — DOCUSATE SODIUM 100 MG PO CAPS
100.0000 mg | ORAL_CAPSULE | Freq: Two times a day (BID) | ORAL | Status: DC
  Administered 2014-02-03 (×2): 100 mg via ORAL
  Filled 2014-02-01 (×2): qty 1

## 2014-02-01 MED ORDER — ONDANSETRON HCL 4 MG/2ML IJ SOLN
4.0000 mg | Freq: Four times a day (QID) | INTRAMUSCULAR | Status: DC | PRN
  Administered 2014-02-05: 4 mg via INTRAVENOUS
  Filled 2014-02-01: qty 2

## 2014-02-01 NOTE — ED Notes (Signed)
Patient transported to CT 

## 2014-02-01 NOTE — ED Notes (Signed)
Pt finished drinking both bottles of contrast.

## 2014-02-01 NOTE — ED Notes (Signed)
Pt c/o abdominal pain. Abdomen tender upon palpation.

## 2014-02-01 NOTE — ED Notes (Signed)
Pts daughter at bedside  

## 2014-02-01 NOTE — ED Notes (Signed)
Pt given contrast to drink.

## 2014-02-01 NOTE — H&P (Signed)
[facility] History and Physical  [name]:[id number] DOB: Feb 26, 1913    PCP:   [name], MD   Chief Complaint: abdominal pain.  HPI: [name] is an 80 y.o. female sent in from [facility] for abdominal pain.  She doesn't respond to me, so history was taken from record and EDP and staff.  She was more alert and was able to converse with the RN, but she only opened her eye when I saw her.  She did not have nausea, or vomiting, and did have low grade temperature.  She did not have any black or bloody stool.  Evalutuion in the ER included a CT of the abd./pelvis which showed dilated intra and extra hepatic ducts, with periportal edema.  She also has distal common bile duct stone with CBD dilated to 51m. She has had CCY.  Her LFTS were grossly elevated with AST of 1058, ASL 349 and AlK 549, with total bili of 1.6, mostly direct.  Her Cr was 1.02, and she has normal electrolytes.  Her Lipase was normal.  Her WBC was elevated to 14K, and her temperature in the ER was 100.8.  Her UA showed no infection, and her CXR was negative.  Hopsitalist was asked to admit her for further evaluation and Tx.   Rewiew of Systems: Unable.   Past Medical History  Diagnosis Date  . Urinary retention   . Gastritis   . Esophageal ulcer   . Salmonella sepsis   . Degenerative disk disease   . Spinal stenosis   . Asthma   . COPD (chronic obstructive pulmonary disease)   . Diabetes mellitus, type 2   . Diverticulosis of colon   . GERD (gastroesophageal reflux disease)   . Hypertension   . Osteoarthritis   . Hiatal hernia   . Pancreatitis   . S/P endoscopy 2009    Dr. FOneida Alar gastritis  . Diverticulitis   . S/P endoscopy March 2012    Dr. RGala Romney NSAID induced ulcer    Past Surgical History  Procedure Laterality Date  . Cervical fusion    . Cholecystectomy      Medications:  HOME MEDS: Prior to Admission medications   Medication Sig Start Date End Date  Taking? Authorizing Provider  Calcium Citrate-Vitamin D 200-250 MG-UNIT TABS Take 1 tablet by mouth daily.   Yes Historical Provider, MD  docusate sodium (COLACE) 100 MG capsule Take 1 capsule (100 mg total) by mouth every 12 (twelve) hours. 03/02/13  Yes BJohnna Acosta MD  ferrous sulfate 325 (65 FE) MG tablet Take 325 mg by mouth daily with breakfast.   Yes Historical Provider, MD  furosemide (LASIX) 40 MG tablet Take 40 mg by mouth daily.    Yes Historical Provider, MD  HYDROcodone-acetaminophen (NORCO/VICODIN) 5-325 MG per tablet Take 1 tablet by mouth every 6 (six) hours as needed for moderate pain.   Yes Historical Provider, MD  levothyroxine (SYNTHROID, LEVOTHROID) 25 MCG tablet Take 25 mcg by mouth daily before breakfast.   Yes Historical Provider, MD  montelukast (SINGULAIR) 10 MG tablet Take 10 mg by mouth every morning.    Yes Historical Provider, MD  Multiple Vitamin (DAILY VITAMIN PO) Take 1 tablet by mouth daily.   Yes Historical Provider, MD  pantoprazole (PROTONIX) 40 MG tablet Take 40 mg by mouth daily.    Yes Historical Provider, MD  solifenacin (VESICARE) 5 MG tablet Take 5 mg by mouth daily.   Yes Historical Provider, MD  theophylline (THEODUR) 200 MG 12 hr tablet Take 200 mg by mouth 2 (two) times daily.    Yes Historical Provider, MD  zolpidem (AMBIEN) 5 MG tablet Take 5 mg by mouth at bedtime as needed for sleep.    Yes Historical Provider, MD     Allergies:  No Known Allergies  Social History:   reports that she has never smoked. She has never used smokeless tobacco. She reports that she does not drink alcohol or use illicit drugs.  Family History: Family History  Problem Relation Age of Onset  . Diabetes Mother      Physical Exam: Filed Vitals:   02/01/14 1748 02/01/14 1800 02/01/14 2035 02/01/14 2040  BP:   134/77   Pulse:  108 95   Temp: 100.4 F (38 C)     TempSrc: Oral     Resp:  16 23   SpO2: 100% 99% 100% 100%   Blood pressure 134/77, pulse 95,  temperature 100.4 F (38 C), temperature source Oral, resp. rate 23, SpO2 100.00%.  GEN:  Pleasant  patient lying in the stretcher in no acute distress; not reponding to me.  Opened eye with moderate stimulation. PSYCH:   does not appear anxious or depressed;  HEENT: Mucous membranes pink and anicteric; PERRLA; EOM intact; no cervical lymphadenopathy nor thyromegaly or carotid bruit; no JVD; There were no stridor. Neck is very supple. Breasts:: Not examined CHEST WALL: No tenderness CHEST: Normal respiration, clear to auscultation bilaterally.  HEART: Regular rate and rhythm.  There are no murmur, rub, or gallops.   BACK: No kyphosis or scoliosis; no CVA tenderness ABDOMEN: soft and non-tender; no masses, no organomegaly, normal abdominal bowel sounds; no pannus; no intertriginous candida. There is no rebound and no distention. Rectal Exam: Not done EXTREMITIES: No bone or joint deformity; age-appropriate arthropathy of the hands and knees; no edema; no ulcerations.  There is no calf tenderness. Genitalia: not examined PULSES: 2+ and symmetric SKIN: Normal hydration no rash or ulceration CNS: Limited exam, with facial symmetry.  Opened her eye and track only slightly.  Labs on Admission:  Basic Metabolic Panel:  Recent Labs Lab 02/01/14 1630  NA 141  K 4.1  CL 96  CO2 29  GLUCOSE 168*  BUN 26*  CREATININE 1.02  CALCIUM 9.6   Liver Function Tests:  Recent Labs Lab 02/01/14 1630  AST 1058*  ALT 349*  ALKPHOS 549*  BILITOT 1.6*  PROT 7.2  ALBUMIN 3.5    Recent Labs Lab 02/01/14 1630  LIPASE 11   No results found for this basename: AMMONIA,  in the last 168 hours CBC:  Recent Labs Lab 02/01/14 1630  WBC 14.0*  NEUTROABS 12.5*  HGB 13.4  HCT 39.5  MCV 96.8  PLT PLATELET CLUMPS NOTED ON SMEAR, COUNT APPEARS ADEQUATE     Radiological Exams on Admission: Ct Abdomen Pelvis W Contrast  02/01/2014   CLINICAL DATA:  Gastritis, COPD, diverticulosis,  hypertension, pancreatitis. Previous cholecystectomy. Abdominal pain.  EXAM: CT ABDOMEN AND PELVIS WITH CONTRAST  TECHNIQUE: Multidetector CT imaging of the abdomen and pelvis was performed using the standard protocol following bolus administration of intravenous contrast.  CONTRAST:  76m OMNIPAQUE IOHEXOL 300 MG/ML SOLN, 550mOMNIPAQUE IOHEXOL 300 MG/ML SOLN  COMPARISON:  CT of abdomen and pelvis on 12/14/2013  FINDINGS: Lower chest: Lung bases are unremarkable. Coronary artery calcification his significant. Heart is normal in size.  Upper abdomen: There is intrahepatic biliary ductal dilatation. Common bile duct is also dilated,  measuring 13 mm. A distal common bile duct stone or stones again identified, similar in appearance to prior study. Since prior study, there has development of increased periportal edema. Status post cholecystectomy. There is no focal liver lesion. No focal abnormality identified within the spleen, pancreas, or adrenal glands. There are bilateral renal cysts. Small amount of fluid is identified around the spleen.  Bowel: Stomach and small bowel loops are normal in appearance. There are numerous colonic diverticula.  Pelvis: The uterus is present. No adnexal mass identified. No free pelvic fluid.  Retroperitoneum: There is atherosclerotic calcification of the abdominal aorta. No aneurysm.  Abdominal wall: Unremarkable  Osseous structures: There are significant degenerative changes in the lower lumbar spine with anterolisthesis of L3 on L4, L4 on L5, and L5 on S1. No suspicious lytic or blastic lesions.  IMPRESSION: 1. Intra and extrahepatic biliary ductal dilatation with distal common bile duct stones. Increased periportal edema since prior study. 2. Status post cholecystectomy. 3. Significant colonic diverticulosis. No evidence for acute diverticulitis. 4. Coronary artery calcification.   Electronically Signed   By: Shon Hale M.D.   On: 02/01/2014 20:22   Dg Chest Portable 1  View  02/01/2014   CLINICAL DATA:  Chest pain  EXAM: PORTABLE CHEST - 1 VIEW  COMPARISON:  01/22/2008  FINDINGS: Cardiac shadow is within normal limits. The lungs are well aerated bilaterally. A prominent skin fold is noted over the left chest. Lung markings are noted beyond this region. No bony abnormality is seen.  IMPRESSION: Prominent skin fold low over the lateral left lung. No acute abnormality is noted.   Electronically Signed   By: Inez Catalina M.D.   On: 02/01/2014 16:09   Assessment/Plan Present on Admission:  . Cholangitis . Cholelithiasis with biliary obstruction . HYPERTENSION . DIABETES MELLITUS, TYPE II . MGUS (monoclonal gammopathy of unknown significance) . Biliary obstruction   PLAN:  This patient is 80 yo with biliary obstruction and possibly has cholangitis, with stone in the distal common bile duct, will be admitted into the ICU for further Tx.  Will made NPO, continue with IV Unasyn, and give a small amount of IVF.  She will likely need EGD with stone removal or ERCP.  Will consult GI in the am.   She is stable and has stable hemodynamics, but she is lethargic.  Will admit her to the unit for closer monitoring.  She is presumed FULL CODE.  Other plans as per orders.  Code Status: Presumed FULL Haskel Khan, MD. [facility] Pager [phone] 7pm to 7am.  02/01/2014, 10:53 PM

## 2014-02-01 NOTE — ED Notes (Signed)
Pt here from [facility] for evaluation of abdominal pain

## 2014-02-01 NOTE — ED Notes (Addendum)
PT drinking second bottle of contrast CT made aware. CT to be done around 1930 per staff.

## 2014-02-01 NOTE — ED Provider Notes (Signed)
CSN: [id number]     Arrival date & time 02/01/14  1513 History  This chart was scribed for [name], MD by Lowella Petties, ED Scribe. The patient was seen in room APA07/APA07. Patient's care was started at 5:00 PM.   Chief Complaint  Patient presents with  . Abdominal Pain   The history is provided by the patient. The history is limited by the condition of the patient. No language interpreter was used.   Level 5 Caveat: Confusion  HPI Comments: [name] is a 80 y.o. female who presents to the Emergency Department from [facility] complaining of generalized abdominal pain. She denies vomiting.   Past Medical History  Diagnosis Date  . Urinary retention   . Gastritis   . Esophageal ulcer   . Salmonella sepsis   . Degenerative disk disease   . Spinal stenosis   . Asthma   . COPD (chronic obstructive pulmonary disease)   . Diabetes mellitus, type 2   . Diverticulosis of colon   . GERD (gastroesophageal reflux disease)   . Hypertension   . Osteoarthritis   . Hiatal hernia   . Pancreatitis   . S/P endoscopy 2009    Dr. Oneida Alar: gastritis  . Diverticulitis   . S/P endoscopy March 2012    Dr. Gala Romney: NSAID induced ulcer   Past Surgical History  Procedure Laterality Date  . Cervical fusion    . Cholecystectomy     Family History  Problem Relation Age of Onset  . Diabetes Mother    History  Substance Use Topics  . Smoking status: Never Smoker   . Smokeless tobacco: Never Used  . Alcohol Use: No   OB History   Grav Para Term Preterm Abortions TAB SAB Ect Mult Living                 Review of Systems  Unable to perform ROS Level 5 Caveat: Confusion  Allergies  Review of patient's allergies indicates no known allergies.  Home Medications   Prior to Admission medications   Medication Sig Start Date End Date Taking? Authorizing Provider  Calcium Citrate-Vitamin D 200-250 MG-UNIT TABS Take 1 tablet by mouth daily.   Yes Historical Provider, MD   docusate sodium (COLACE) 100 MG capsule Take 1 capsule (100 mg total) by mouth every 12 (twelve) hours. 03/02/13  Yes Johnna Acosta, MD  ferrous sulfate 325 (65 FE) MG tablet Take 325 mg by mouth daily with breakfast.   Yes Historical Provider, MD  furosemide (LASIX) 40 MG tablet Take 40 mg by mouth daily.    Yes Historical Provider, MD  HYDROcodone-acetaminophen (NORCO/VICODIN) 5-325 MG per tablet Take 1 tablet by mouth every 6 (six) hours as needed for moderate pain.   Yes Historical Provider, MD  levothyroxine (SYNTHROID, LEVOTHROID) 25 MCG tablet Take 25 mcg by mouth daily before breakfast.   Yes Historical Provider, MD  montelukast (SINGULAIR) 10 MG tablet Take 10 mg by mouth every morning.    Yes Historical Provider, MD  Multiple Vitamin (DAILY VITAMIN PO) Take 1 tablet by mouth daily.   Yes Historical Provider, MD  pantoprazole (PROTONIX) 40 MG tablet Take 40 mg by mouth daily.    Yes Historical Provider, MD  solifenacin (VESICARE) 5 MG tablet Take 5 mg by mouth daily.   Yes Historical Provider, MD  theophylline (THEODUR) 200 MG 12 hr tablet Take 200 mg by mouth 2 (two) times daily.    Yes Historical Provider, MD  zolpidem (  AMBIEN) 5 MG tablet Take 5 mg by mouth at bedtime as needed for sleep.    Yes Historical Provider, MD   Triage Vitals: BP 175/65  Pulse 96  Temp(Src) 100.8 F (38.2 C) (Oral)  Resp 20  SpO2 92% Physical Exam  Nursing note and vitals reviewed. Constitutional: She is oriented to person, place, and time. She appears well-developed and well-nourished. No distress.  HENT:  Head: Normocephalic and atraumatic.  Mucus membranes dry  Eyes: Conjunctivae and EOM are normal.  Neck: Neck supple. No tracheal deviation present.  Cardiovascular: Normal rate, regular rhythm and normal heart sounds.   No murmur heard. Pulmonary/Chest: Effort normal and breath sounds normal. No respiratory distress. She has no wheezes. She has no rales.  Abdominal: Soft. There is no  tenderness.  Musculoskeletal: Normal range of motion. She exhibits edema (both ankles bilaterally, but not legs).  Neurological: She is alert and oriented to person, place, and time.  Skin: Skin is warm and dry.  Psychiatric: She has a normal mood and affect. Her behavior is normal.    ED Course  Procedures (including critical care time) DIAGNOSTIC STUDIES: Oxygen Saturation is 99% on O2 2L, normal by my interpretation.    COORDINATION OF CARE: 5:06 PM-Discussed treatment plan which includes lab work with pt at bedside and pt agreed to plan.   Labs Review Labs Reviewed  CBC WITH DIFFERENTIAL - Abnormal; Notable for the following:    WBC 14.0 (*)    Neutrophils Relative % 89 (*)    Neutro Abs 12.5 (*)    Lymphocytes Relative 2 (*)    Lymphs Abs 0.3 (*)    Monocytes Absolute 1.2 (*)    All other components within normal limits  BASIC METABOLIC PANEL - Abnormal; Notable for the following:    Glucose, Bld 168 (*)    BUN 26 (*)    GFR calc non Af Amer 44 (*)    GFR calc Af Amer 51 (*)    Anion gap 16 (*)    All other components within normal limits  HEPATIC FUNCTION PANEL - Abnormal; Notable for the following:    AST 1058 (*)    ALT 349 (*)    Alkaline Phosphatase 549 (*)    Total Bilirubin 1.6 (*)    Bilirubin, Direct 1.2 (*)    All other components within normal limits  CULTURE, BLOOD (ROUTINE X 2)  CULTURE, BLOOD (ROUTINE X 2)  MRSA PCR SCREENING  URINALYSIS, ROUTINE W REFLEX MICROSCOPIC  LIPASE, BLOOD  TSH  COMPREHENSIVE METABOLIC PANEL  CBC   Results for orders placed during the hospital encounter of 02/01/14  URINALYSIS, ROUTINE W REFLEX MICROSCOPIC      Result Value Ref Range   Color, Urine YELLOW  YELLOW   APPearance CLEAR  CLEAR   Specific Gravity, Urine 1.010  1.005 - 1.030   pH 7.0  5.0 - 8.0   Glucose, UA NEGATIVE  NEGATIVE mg/dL   Hgb urine dipstick NEGATIVE  NEGATIVE   Bilirubin Urine NEGATIVE  NEGATIVE   Ketones, ur NEGATIVE  NEGATIVE mg/dL    Protein, ur NEGATIVE  NEGATIVE mg/dL   Urobilinogen, UA 1.0  0.0 - 1.0 mg/dL   Nitrite NEGATIVE  NEGATIVE   Leukocytes, UA NEGATIVE  NEGATIVE  CBC WITH DIFFERENTIAL      Result Value Ref Range   WBC 14.0 (*) 4.0 - 10.5 K/uL   RBC 4.08  3.87 - 5.11 MIL/uL   Hemoglobin 13.4  12.0 - 15.0 g/dL  HCT 39.5  36.0 - 46.0 %   MCV 96.8  78.0 - 100.0 fL   MCH 32.8  26.0 - 34.0 pg   MCHC 33.9  30.0 - 36.0 g/dL   RDW 13.5  11.5 - 15.5 %   Platelets    150 - 400 K/uL   Value: PLATELET CLUMPS NOTED ON SMEAR, COUNT APPEARS ADEQUATE   Neutrophils Relative % 89 (*) 43 - 77 %   Neutro Abs 12.5 (*) 1.7 - 7.7 K/uL   Lymphocytes Relative 2 (*) 12 - 46 %   Lymphs Abs 0.3 (*) 0.7 - 4.0 K/uL   Monocytes Relative 8  3 - 12 %   Monocytes Absolute 1.2 (*) 0.1 - 1.0 K/uL   Eosinophils Relative 1  0 - 5 %   Eosinophils Absolute 0.1  0.0 - 0.7 K/uL   Basophils Relative 0  0 - 1 %   Basophils Absolute 0.0  0.0 - 0.1 K/uL  BASIC METABOLIC PANEL      Result Value Ref Range   Sodium 141  137 - 147 mEq/L   Potassium 4.1  3.7 - 5.3 mEq/L   Chloride 96  96 - 112 mEq/L   CO2 29  19 - 32 mEq/L   Glucose, Bld 168 (*) 70 - 99 mg/dL   BUN 26 (*) 6 - 23 mg/dL   Creatinine, Ser 1.02  0.50 - 1.10 mg/dL   Calcium 9.6  8.4 - 10.5 mg/dL   GFR calc non Af Amer 44 (*) >90 mL/min   GFR calc Af Amer 51 (*) >90 mL/min   Anion gap 16 (*) 5 - 15  LIPASE, BLOOD      Result Value Ref Range   Lipase 11  11 - 59 U/L  HEPATIC FUNCTION PANEL      Result Value Ref Range   Total Protein 7.2  6.0 - 8.3 g/dL   Albumin 3.5  3.5 - 5.2 g/dL   AST 1058 (*) 0 - 37 U/L   ALT 349 (*) 0 - 35 U/L   Alkaline Phosphatase 549 (*) 39 - 117 U/L   Total Bilirubin 1.6 (*) 0.3 - 1.2 mg/dL   Bilirubin, Direct 1.2 (*) 0.0 - 0.3 mg/dL   Indirect Bilirubin 0.4  0.3 - 0.9 mg/dL     Imaging Review Ct Abdomen Pelvis W Contrast  02/01/2014   CLINICAL DATA:  Gastritis, COPD, diverticulosis, hypertension, pancreatitis. Previous cholecystectomy.  Abdominal pain.  EXAM: CT ABDOMEN AND PELVIS WITH CONTRAST  TECHNIQUE: Multidetector CT imaging of the abdomen and pelvis was performed using the standard protocol following bolus administration of intravenous contrast.  CONTRAST:  77mL OMNIPAQUE IOHEXOL 300 MG/ML SOLN, 32mL OMNIPAQUE IOHEXOL 300 MG/ML SOLN  COMPARISON:  CT of abdomen and pelvis on 12/14/2013  FINDINGS: Lower chest: Lung bases are unremarkable. Coronary artery calcification his significant. Heart is normal in size.  Upper abdomen: There is intrahepatic biliary ductal dilatation. Common bile duct is also dilated, measuring 13 mm. A distal common bile duct stone or stones again identified, similar in appearance to prior study. Since prior study, there has development of increased periportal edema. Status post cholecystectomy. There is no focal liver lesion. No focal abnormality identified within the spleen, pancreas, or adrenal glands. There are bilateral renal cysts. Small amount of fluid is identified around the spleen.  Bowel: Stomach and small bowel loops are normal in appearance. There are numerous colonic diverticula.  Pelvis: The uterus is present. No adnexal mass identified. No free  pelvic fluid.  Retroperitoneum: There is atherosclerotic calcification of the abdominal aorta. No aneurysm.  Abdominal wall: Unremarkable  Osseous structures: There are significant degenerative changes in the lower lumbar spine with anterolisthesis of L3 on L4, L4 on L5, and L5 on S1. No suspicious lytic or blastic lesions.  IMPRESSION: 1. Intra and extrahepatic biliary ductal dilatation with distal common bile duct stones. Increased periportal edema since prior study. 2. Status post cholecystectomy. 3. Significant colonic diverticulosis. No evidence for acute diverticulitis. 4. Coronary artery calcification.   Electronically Signed   By: Shon Hale M.D.   On: 02/01/2014 20:22   Dg Chest Portable 1 View  02/01/2014   CLINICAL DATA:  Chest pain  EXAM: PORTABLE  CHEST - 1 VIEW  COMPARISON:  01/22/2008  FINDINGS: Cardiac shadow is within normal limits. The lungs are well aerated bilaterally. A prominent skin fold is noted over the left chest. Lung markings are noted beyond this region. No bony abnormality is seen.  IMPRESSION: Prominent skin fold low over the lateral left lung. No acute abnormality is noted.   Electronically Signed   By: Inez Catalina M.D.   On: 02/01/2014 16:09     EKG Interpretation None        Date: 02/01/2014  Rate: 109  Rhythm: sinus tachycardia  QRS Axis: normal  Intervals: normal  ST/T Wave abnormalities: nonspecific ST/T changes  Conduction Disutrbances:none  Narrative Interpretation:   Old EKG Reviewed: none available   MDM   Final diagnoses:  Fever, unspecified fever cause  Abdominal pain, unspecified abdominal location  Cholangitis   The patient from [facility] facility. Patient's workup here consistent with a marked biliary dilatation and stones. Patient's gallbladder is already been removed. Patient here with low-grade fever. Technically this is consistent with a mild to moderate cholangitis. Abdominal exam without any acute surgical findings. Patient started on Unasyn for this. Patient will be admitted by the hospitalist. GI medicine will be consult in the morning.  I personally performed the services described in this documentation, which was scribed in my presence. The recorded information has been reviewed and is accurate.     [name], MD 02/02/14 [phone]

## 2014-02-01 NOTE — ED Notes (Signed)
Pt denies abd pain at present, admits to some nausea - no acute distress - alert, pleasant and cooperative.

## 2014-02-02 ENCOUNTER — Encounter (HOSPITAL_COMMUNITY): Payer: PRIVATE HEALTH INSURANCE | Admitting: Anesthesiology

## 2014-02-02 ENCOUNTER — Encounter (HOSPITAL_COMMUNITY): Payer: Self-pay | Admitting: Gastroenterology

## 2014-02-02 ENCOUNTER — Inpatient Hospital Stay (HOSPITAL_COMMUNITY): Payer: PRIVATE HEALTH INSURANCE

## 2014-02-02 ENCOUNTER — Inpatient Hospital Stay (HOSPITAL_COMMUNITY): Payer: PRIVATE HEALTH INSURANCE | Admitting: Anesthesiology

## 2014-02-02 ENCOUNTER — Encounter (HOSPITAL_COMMUNITY)
Admission: EM | Disposition: A | Discharge status: Skilled Nursing Facility | Payer: Self-pay | Source: Home / Self Care | Attending: Family Medicine

## 2014-02-02 DIAGNOSIS — E119 Type 2 diabetes mellitus without complications: Secondary | ICD-10-CM

## 2014-02-02 DIAGNOSIS — K8021 Calculus of gallbladder without cholecystitis with obstruction: Secondary | ICD-10-CM

## 2014-02-02 HISTORY — PX: SPYGLASS CHOLANGIOSCOPY: SHX5441

## 2014-02-02 HISTORY — PX: BALLOON DILATION: SHX5330

## 2014-02-02 HISTORY — PX: SPHINCTEROTOMY: SHX5544

## 2014-02-02 HISTORY — PX: ERCP: SHX5425

## 2014-02-02 HISTORY — PX: BILIARY STENT PLACEMENT: SHX5538

## 2014-02-02 LAB — COMPREHENSIVE METABOLIC PANEL
ALBUMIN: 2.9 g/dL — AB (ref 3.5–5.2)
ALT: 282 U/L — ABNORMAL HIGH (ref 0–35)
ANION GAP: 13 (ref 5–15)
AST: 518 U/L — ABNORMAL HIGH (ref 0–37)
Alkaline Phosphatase: 478 U/L — ABNORMAL HIGH (ref 39–117)
BILIRUBIN TOTAL: 1.5 mg/dL — AB (ref 0.3–1.2)
BUN: 20 mg/dL (ref 6–23)
CHLORIDE: 100 meq/L (ref 96–112)
CO2: 29 mEq/L (ref 19–32)
CREATININE: 0.99 mg/dL (ref 0.50–1.10)
Calcium: 8.7 mg/dL (ref 8.4–10.5)
GFR calc non Af Amer: 46 mL/min — ABNORMAL LOW (ref >90)
GFR, EST AFRICAN AMERICAN: 53 mL/min — AB (ref >90)
GLUCOSE: 94 mg/dL (ref 70–99)
POTASSIUM: 3.4 meq/L — AB (ref 3.7–5.3)
Sodium: 142 mEq/L (ref 137–147)
Total Protein: 6.2 g/dL (ref 6.0–8.3)

## 2014-02-02 LAB — GLUCOSE, CAPILLARY
GLUCOSE-CAPILLARY: 61 mg/dL — AB (ref 70–99)
GLUCOSE-CAPILLARY: 121 mg/dL — AB (ref 70–99)
GLUCOSE-CAPILLARY: 84 mg/dL (ref 70–99)
Glucose-Capillary: 80 mg/dL (ref 70–99)
Glucose-Capillary: 119 mg/dL — ABNORMAL HIGH (ref 70–99)
Glucose-Capillary: 104 mg/dL — ABNORMAL HIGH (ref 70–99)

## 2014-02-02 LAB — CBC
HCT: 33.9 % — ABNORMAL LOW (ref 36.0–46.0)
Hemoglobin: 11.7 g/dL — ABNORMAL LOW (ref 12.0–15.0)
MCH: 32.5 pg (ref 26.0–34.0)
MCHC: 34.5 g/dL (ref 30.0–36.0)
MCV: 94.2 fL (ref 78.0–100.0)
Platelets: 188 10*3/uL (ref 150–400)
RBC: 3.6 MIL/uL — ABNORMAL LOW (ref 3.87–5.11)
RDW: 13.8 % (ref 11.5–15.5)
WBC: 14.9 10*3/uL — AB (ref 4.0–10.5)

## 2014-02-02 LAB — MRSA PCR SCREENING: MRSA BY PCR: NEGATIVE

## 2014-02-02 LAB — TSH: TSH: 1.26 u[IU]/mL (ref 0.350–4.500)

## 2014-02-02 SURGERY — ERCP, WITH INTERVENTION IF INDICATED
Anesthesia: General

## 2014-02-02 MED ORDER — ONDANSETRON HCL 4 MG/2ML IJ SOLN
INTRAMUSCULAR | Status: DC | PRN
  Administered 2014-02-02: 4 mg via INTRAVENOUS

## 2014-02-02 MED ORDER — LIDOCAINE HCL (PF) 1 % IJ SOLN
INTRAMUSCULAR | Status: AC
  Filled 2014-02-02: qty 5

## 2014-02-02 MED ORDER — INSULIN ASPART 100 UNIT/ML ~~LOC~~ SOLN
0.0000 [IU] | Freq: Four times a day (QID) | SUBCUTANEOUS | Status: DC
  Administered 2014-02-05 – 2014-02-07 (×3): 1 [IU] via SUBCUTANEOUS
  Administered 2014-02-08 (×2): 2 [IU] via SUBCUTANEOUS
  Administered 2014-02-09: 1 [IU] via SUBCUTANEOUS
  Administered 2014-02-09: 2 [IU] via SUBCUTANEOUS

## 2014-02-02 MED ORDER — SODIUM CHLORIDE 0.9 % IV SOLN
INTRAVENOUS | Status: AC
  Filled 2014-02-02: qty 1.5

## 2014-02-02 MED ORDER — ROCURONIUM BROMIDE 50 MG/5ML IV SOLN
INTRAVENOUS | Status: AC
  Filled 2014-02-02: qty 1

## 2014-02-02 MED ORDER — EPHEDRINE SULFATE 50 MG/ML IJ SOLN
INTRAMUSCULAR | Status: DC | PRN
  Administered 2014-02-02: 10 mg via INTRAVENOUS

## 2014-02-02 MED ORDER — SODIUM CHLORIDE 0.9 % IV SOLN
1.5000 g | Freq: Four times a day (QID) | INTRAVENOUS | Status: DC
  Administered 2014-02-02 – 2014-02-03 (×6): 1.5 g via INTRAVENOUS
  Filled 2014-02-02 (×18): qty 1.5

## 2014-02-02 MED ORDER — SUCCINYLCHOLINE CHLORIDE 20 MG/ML IJ SOLN
INTRAMUSCULAR | Status: DC | PRN
  Administered 2014-02-02: 120 mg via INTRAVENOUS

## 2014-02-02 MED ORDER — SUCCINYLCHOLINE CHLORIDE 20 MG/ML IJ SOLN
INTRAMUSCULAR | Status: AC
  Filled 2014-02-02: qty 1

## 2014-02-02 MED ORDER — FENTANYL CITRATE 0.05 MG/ML IJ SOLN
INTRAMUSCULAR | Status: DC | PRN
  Administered 2014-02-02 (×2): 25 ug via INTRAVENOUS

## 2014-02-02 MED ORDER — STERILE WATER FOR IRRIGATION IR SOLN
Status: DC | PRN
  Administered 2014-02-02: 1000 mL

## 2014-02-02 MED ORDER — FENTANYL CITRATE 0.05 MG/ML IJ SOLN
INTRAMUSCULAR | Status: AC
  Filled 2014-02-02: qty 2

## 2014-02-02 MED ORDER — DEXTROSE 50 % IV SOLN
25.0000 mL | Freq: Once | INTRAVENOUS | Status: AC
  Administered 2014-02-02: 25 mL via INTRAVENOUS

## 2014-02-02 MED ORDER — DEXTROSE 50 % IV SOLN: 25.0000 mL | Freq: Once | INTRAVENOUS | Status: DC | PRN

## 2014-02-02 MED ORDER — MIDAZOLAM HCL 5 MG/5ML IJ SOLN
INTRAMUSCULAR | Status: DC | PRN
  Administered 2014-02-02 (×2): 0.5 mg via INTRAVENOUS

## 2014-02-02 MED ORDER — GLUCAGON HCL RDNA (DIAGNOSTIC) 1 MG IJ SOLR
INTRAMUSCULAR | Status: DC | PRN
  Administered 2014-02-02 (×3): .5 mg via INTRAVENOUS

## 2014-02-02 MED ORDER — LIDOCAINE HCL 1 % IJ SOLN
INTRAMUSCULAR | Status: DC | PRN
  Administered 2014-02-02: 30 mg via INTRADERMAL

## 2014-02-02 MED ORDER — MIDAZOLAM HCL 2 MG/2ML IJ SOLN
INTRAMUSCULAR | Status: AC
  Filled 2014-02-02: qty 2

## 2014-02-02 MED ORDER — GLUCAGON HCL RDNA (DIAGNOSTIC) 1 MG IJ SOLR
INTRAMUSCULAR | Status: AC
  Filled 2014-02-02: qty 1

## 2014-02-02 MED ORDER — ETOMIDATE 2 MG/ML IV SOLN
INTRAVENOUS | Status: DC | PRN
  Administered 2014-02-02: 8 mg via INTRAVENOUS

## 2014-02-02 MED ORDER — ROCURONIUM BROMIDE 100 MG/10ML IV SOLN
INTRAVENOUS | Status: DC | PRN
  Administered 2014-02-02: 10 mg via INTRAVENOUS

## 2014-02-02 MED ORDER — STERILE WATER FOR IRRIGATION IR SOLN
Status: DC | PRN
  Administered 2014-02-02: 15:00:00

## 2014-02-02 MED ORDER — DEXTROSE 50 % IV SOLN
INTRAVENOUS | Status: AC
  Administered 2014-02-02: 25 mL via INTRAVENOUS
  Filled 2014-02-02: qty 50

## 2014-02-02 MED ORDER — ONDANSETRON HCL 4 MG/2ML IJ SOLN
INTRAMUSCULAR | Status: AC
  Filled 2014-02-02: qty 2

## 2014-02-02 MED ORDER — SODIUM CHLORIDE 0.9 % IV SOLN
INTRAVENOUS | Status: AC
  Filled 2014-02-02: qty 50

## 2014-02-02 MED ORDER — INSULIN ASPART 100 UNIT/ML ~~LOC~~ SOLN: 0.0000 [IU] | Freq: Four times a day (QID) | SUBCUTANEOUS | Status: DC

## 2014-02-02 MED ORDER — GLYCOPYRROLATE 0.2 MG/ML IJ SOLN
INTRAMUSCULAR | Status: AC
  Filled 2014-02-02: qty 1

## 2014-02-02 MED ORDER — GLYCOPYRROLATE 0.2 MG/ML IJ SOLN
INTRAMUSCULAR | Status: DC | PRN
  Administered 2014-02-02: 0.2 mg via INTRAVENOUS

## 2014-02-02 MED ORDER — CALCIUM CITRATE-VITAMIN D 200-250 MG-UNIT PO TABS: 1.0000 | ORAL_TABLET | Freq: Every day | ORAL | Status: DC

## 2014-02-02 MED ORDER — SODIUM CHLORIDE 0.9 % IV SOLN
INTRAVENOUS | Status: DC | PRN
  Administered 2014-02-02: 15:00:00

## 2014-02-02 MED ORDER — ETOMIDATE 2 MG/ML IV SOLN
INTRAVENOUS | Status: AC
  Filled 2014-02-02: qty 10

## 2014-02-02 MED ORDER — CALCIUM CARBONATE-VITAMIN D 500-200 MG-UNIT PO TABS
1.0000 | ORAL_TABLET | Freq: Every day | ORAL | Status: DC
  Administered 2014-02-03 – 2014-02-09 (×6): 1 via ORAL
  Filled 2014-02-02 (×9): qty 1

## 2014-02-02 MED ORDER — LACTATED RINGERS IV SOLN
INTRAVENOUS | Status: DC | PRN
  Administered 2014-02-02 (×2): via INTRAVENOUS

## 2014-02-02 SURGICAL SUPPLY — 25 items
BALLN RETRIEVAL 12X15 (BALLOONS) ×2 IMPLANT
BASKET TRAPEZOID 3X6 (MISCELLANEOUS) IMPLANT
BASKET TRAPEZOID LITHO 2.5X5 (MISCELLANEOUS) ×2 IMPLANT
BLOCK BITE 60FR ADLT L/F BLUE (MISCELLANEOUS) ×2 IMPLANT
DEVICE INFLATION ENCORE 26 (MISCELLANEOUS) IMPLANT
DEVICE LOCKING W-BIOPSY CAP (MISCELLANEOUS) ×2 IMPLANT
FLOOR PAD 36X40 (MISCELLANEOUS) ×2
GUIDEWIRE HYDRA JAGWIRE .35 (WIRE) IMPLANT
GUIDEWIRE JAG HINI 025X260CM (WIRE) IMPLANT
KIT CLEAN ENDO COMPLIANCE (KITS) ×2 IMPLANT
PAD FLOOR 36X40 (MISCELLANEOUS) ×1 IMPLANT
SCOPE SPY DS DISPOSABLE (MISCELLANEOUS) ×2 IMPLANT
SNARE ROTATE MED OVAL 20MM (MISCELLANEOUS) IMPLANT
SNARE SHORT THROW 13M SML OVAL (MISCELLANEOUS) IMPLANT
SNARE SHORT THROW 27M MED OVAL (MISCELLANEOUS) IMPLANT
SPHINCTEROTOME AUTOTOME .25 (MISCELLANEOUS) IMPLANT
SPHINCTEROTOME HYDRATOME 44 (MISCELLANEOUS) ×4 IMPLANT
STENT RX PRELOADED 10FRX5CM (Stent) ×2 IMPLANT
SYR 20CC LL (SYRINGE) IMPLANT
SYR 50ML LL SCALE MARK (SYRINGE) ×2 IMPLANT
SYSTEM CONTINUOUS INJECTION (MISCELLANEOUS) ×2 IMPLANT
TUBING INSUFFLATOR CO2MPACT (TUBING) ×2 IMPLANT
WALLSTENT METAL COVERED 10X60 (STENTS) IMPLANT
WALLSTENT METAL COVERED 10X80 (STENTS) IMPLANT
WATER STERILE IRR 1000ML POUR (IV SOLUTION) ×2 IMPLANT

## 2014-02-02 NOTE — Op Note (Signed)
NAMEKIRSTY, [name]           ACCOUNT NO.:  [id number]  MEDICAL RECORD NO.:  [id number]  LOCATION:  A333                          FACILITY:  [facility]  PHYSICIAN:  R. Garfield Cornea, MD FACP FACGDATE OF BIRTH:  03-May-1913  DATE OF PROCEDURE:  02/02/2014 DATE OF DISCHARGE:                              OPERATIVE REPORT   PROCEDURE PERFORMED:  ERCP with biliary sphincterotomy, balloon and basket stone extraction, attempted cholangioscopy and plastic stent placement.  INDICATIONS FOR PROCEDURE:  A 80 year old lady admitted with cholangitis, markedly elevated LFTs, and a dilated bile duct, multiple stones in the distal duct on CT.  ERCP now being performed.  Risks, benefits, limitations, alternatives, imponderables have been discussed with the patient, patient's daughter and her healthcare power of attorney (Department of Social Services); all questions answered and all parties agreeable.  PROCEDURE NOTE:  General endotracheal anesthesia induced by Dr. [facility].  Unasyn 1.5 g IV given prior to the procedure.  INSTRUMENT:  Pentax video chip duodenoscope.  FINDINGS:  Cursory examination, distal esophagus and stomach revealed no abnormalities.  The patient had multiple diverticula in the second portion of duodenum, a large one to the right and superior of bulging ampulla.  Ampulla appeared normal otherwise.  No evidence of neoplasm. Scope was pulled back to the semi-long position where scout film was Taken.  Utilizing the [place] was approached.  I almost immediately achieved a deep cannulation.  The approach was poor, however, for cannulation.  The guidewire was advanced up; cholangiogram was Performed; there appeared to be at least one "piston-type" stone in the distal duct.  The common bile duct was massively dilated.  Safety wire was advanced deep in the biliary tree.  The sphincterotome was pulled back across the ampullary orifice where  approximately 1.1 cm sphincterotomy was performed using the ERBE unit at 12 o'clock position. This was done nice without apparent complication.  With this maneuver, large amount of gravel and sludge poured through the ampullary orifice along with dark bile.  I used the basket and extractor balloon multiple times and removed multiple fragments.  One large piece of stone was removed following the last pass of the balloon.  [name] continued to pour out of the ampullary orifice and there was relatively poor drainage noted.  A couple of more passes with the balloon and basket yielded no larger stones.  The duct continued not to drain very well.  I attempted to use SpyGlass; however, with the tangential angulation and approach, for the bile duct, I was unable to get the scope up into the duct. Subsequently, decided to place a biliary stent to ensure drainage.  I placed a safety wire deep into the biliary tree with a sphincterotome and railed the sphincterotome off and railed on a 10-French, 5-cm, plastic stent between flaps.  This was deployed in excellent position under fluoroscopy.  The pancreatic duct was not manipulated or injected.  Fluoro time just over 5 minutes.  The patient tolerated the procedure, was taken to PACU, stable condition.  IMPRESSION:  Juxtampullary diverticula, massively dilated bile duct. Choledocholithiasis.  Status post biliary sphincterotomy.  Balloon and basket stone extraction and stent placement.  Attempted cholangioscopy as  described above.  RECOMMENDATIONS: 1. Clear liquid diet. 2. Repeat labs tomorrow morning. 3. Continue Unasyn for the time being. 4. Final review of films pending with the radiologist.     Bridgette Habermann, MD [name]     RMR/MEDQ  D:  02/02/2014  T:  02/02/2014  Job:  [id number]

## 2014-02-02 NOTE — Progress Notes (Signed)
  PROGRESS NOTE  [name] OMB:[id number] DOB: May 09, 1913 DOA: 02/01/2014 PCP: [name], MD  Summary: 80 year old woman sent to the emergency department for abdominal pain, low-grade fever. Evaluation revealed dilatation of the hepatic ducts and distal common bile duct stone. Patient admitted for cholangitis, choledocholithiasis, GI consultation.  Assessment/Plan: 1. Choledocholithiasis, suspect acute cholangitis. 2. Diabetes mellitus type 2. Stable. 3. History of COPD.   Appears clinically stable. LFTs trending downwards. GI consultation pending for further recommendations in consideration of ERCP. In the meantime continue antibiotics, follow daily laboratory studies.  Sliding-scale insulin.  Plan to discuss with family was present, clarify CODE STATUS.  Transfer to medical floor.  Code Status: full code DVT prophylaxis: heparin Family Communication: none present Disposition Plan: return to [facility]?  Murray Hodgkins, MD  [facility]  Pager [phone] If 7PM-7AM, please contact night-coverage at [email or web address], password [facility] 02/02/2014, 8:26 AM  LOS: 1 day   Consultants:  Gastroenterology  Procedures:    Antibiotics:  Unasyn 9/16 >>   HPI/Subjective: She continues to have some generalized abdominal pain. Otherwise feels okay. No vomiting.  Objective: Filed Vitals:   02/01/14 2319 02/01/14 2329 02/02/14 0400 02/02/14 0742  BP: 143/67     Pulse: 91 86    Temp: 99.5 F (37.5 C)  99.4 F (37.4 C) 98 F (36.7 C)  TempSrc: Oral  Oral Oral  Resp: 22 22    Height: 5\' 2"  (1.575 m)     Weight: 42 kg (92 lb 9.5 oz)     SpO2: 100% 100%     No intake or output data in the 24 hours ending 02/02/14 0826   Filed Weights   02/01/14 2319  Weight: 42 kg (92 lb 9.5 oz)    Exam:     Afebrile, vital signs stable. No hypoxia. Gen. Appears calm, comfortable.  Psych. Alert, speech fluent and clear.  Cardiovascular. Regular rate and  rhythm. No murmur, rub or gallop. No lower extremity edema. Telemetry sinus rhythm.  Respiratory. Clear to auscultation bilaterally. No wheezes, rales or rhonchi. Normal respiratory effort.  Abdomen. Soft, nondistended, mild generalized tenderness.  Musculoskeletal. Moves all extremities to command.  Data Reviewed:  Potassium 3.4. AST, ALT, alkaline phosphatase, total bilirubin trending downwards. Lipase was normal on admission.  Leukocytosis without significant change, 14.9.  Scheduled Meds: . ampicillin-sulbactam (UNASYN) IV  1.5 g Intravenous Q6H  . calcium-vitamin D  1 tablet Oral Q breakfast  . darifenacin  7.5 mg Oral Daily  . docusate sodium  100 mg Oral Q12H  . heparin  5,000 Units Subcutaneous 3 times per day  . levothyroxine  25 mcg Oral QAC breakfast  . montelukast  10 mg Oral q morning - 10a  . pantoprazole  40 mg Oral Daily  . sodium chloride  3 mL Intravenous Q12H   Continuous Infusions: . sodium chloride 50 mL/hr at 02/01/14 2359    Active Problems:   DIABETES MELLITUS, TYPE II   HYPERTENSION   MGUS (monoclonal gammopathy of unknown significance)   Cholangitis   Cholelithiasis with biliary obstruction   Biliary obstruction   Time spent 20 minutes

## 2014-02-02 NOTE — Transfer of Care (Signed)
Immediate Anesthesia Transfer of Care Note  Patient: [name]  Procedure(s) Performed: Procedure(s): ENDOSCOPIC RETROGRADE CHOLANGIOPANCREATOGRAPHY (ERCP), STONE BASKET EXTRACTION (N/A) SPHINCTEROTOMY (N/A) BALLOON DILATION (N/A) BILIARY STENT PLACEMENT (N/A) SPYGLASS CHOLANGIOSCOPY (N/A)  Patient Location: PACU  Anesthesia Type:General  Level of Consciousness: sedated  Airway & Oxygen Therapy: Patient Spontanous Breathing and Patient connected to face mask oxygen  Post-op Assessment: Report given to PACU RN, Post -op Vital signs reviewed and stable and Patient moving all extremities  Post vital signs: Reviewed and stable  Complications: No apparent anesthesia complications

## 2014-02-02 NOTE — Clinical Social Work Psychosocial (Signed)
Clinical Social Work Department BRIEF PSYCHOSOCIAL ASSESSMENT 02/02/2014  Patient:  [name], [name]     Account Number:  [id number]     Admit date:  02/01/2014  Clinical Social Worker:  Wyatt Haste  Date/Time:  02/02/2014 11:49 AM  Referred by:  CSW  Date Referred:  02/02/2014 Referred for  ALF Placement   Other Referral:   Interview type:  Other - See comment Other interview type:   DSS is guardian. Spoke with [name].    PSYCHOSOCIAL DATA Living Status:  FACILITY Admitted from facility:  [facility] Level of care:  Assisted Living Primary support name:  DSS Primary support relationship to patient:  NONE Degree of support available:   pt's daughter [name] is involved    CURRENT CONCERNS Current Concerns  Post-Acute Placement   Other Concerns:    SOCIAL WORK ASSESSMENT / PLAN CSW spoke with [name] at [facility] regarding pt. She reports pt has been a resident there for several months. Pt has DSS guardian, [name]. [name] reports pt requires limited assist with bathing and dressing. She generally ambulates with a walker and assist. No home health prior to admission and pt is okay to return at d/c. CSW spoke with [name] at [facility] as [name] is not working today. [name] pulled record and states that they have been guardian since June. Pt does have a daughter, [name], who is involved and lives in [place]. Pt had 10 children, 7 living. Some of them live out of town. She was living with a daughter, [name] and pt was left alone all day and did not have food. [name] requests return to [facility] when medically stable.   Assessment/plan status:  Psychosocial Support/Ongoing Assessment of Needs Other assessment/ plan:   Information/referral to community resources:   [place]    PATIENT'S/FAMILY'S RESPONSE TO PLAN OF CARE: Pt has DSS guardian. Plan is to return to [facility] when d/c. CSW will continue to follow.       [name],  Beatrice

## 2014-02-02 NOTE — Progress Notes (Signed)
UR chart review completed.  

## 2014-02-02 NOTE — Progress Notes (Signed)
Called report for [name] to be transferred to medical [phone]. Report given to nurse and also told her of patient being ward of state and needing ERCP this afternoon and that we had called social services and left messages for them to call back to give consent. Nurse stated she understood. Patient placed in wheelchair and tech transported patient to 300 room. All medications were transported.

## 2014-02-02 NOTE — Anesthesia Preprocedure Evaluation (Addendum)
Anesthesia Evaluation  Patient identified by MRN, date of birth, ID band Patient awake    Reviewed: Allergy & Precautions, H&P , NPO status , Patient's Chart, lab work & pertinent test results  Airway Mallampati: I TM Distance: >3 FB Neck ROM: Full    Dental  (+) Edentulous Upper, Edentulous Lower   Pulmonary asthma , COPD breath sounds clear to auscultation        Cardiovascular hypertension, Pt. on medications Rhythm:Irregular Rate:Normal     Neuro/Psych    GI/Hepatic hiatal hernia, PUD, GERD-  Medicated and Controlled,  Endo/Other  diabetes, Well Controlled, Type 2Hypothyroidism   Renal/GU      Musculoskeletal   Abdominal (+)  Abdomen: soft. Bowel sounds: normal.  Peds  Hematology   Anesthesia Other Findings   Reproductive/Obstetrics                         Anesthesia Physical Anesthesia Plan  ASA: III  Anesthesia Plan: General   Post-op Pain Management:    Induction: Intravenous, Rapid sequence and Cricoid pressure planned  Airway Management Planned: Oral ETT  Additional Equipment:   Intra-op Plan:   Post-operative Plan: Extubation in OR  Informed Consent:   Plan Discussed with: CRNA, Anesthesiologist and Surgeon  Anesthesia Plan Comments:         Anesthesia Quick Evaluation

## 2014-02-02 NOTE — Anesthesia Postprocedure Evaluation (Signed)
  Anesthesia Post-op Note  Patient: [name]  Procedure(s) Performed: Procedure(s): ENDOSCOPIC RETROGRADE CHOLANGIOPANCREATOGRAPHY (ERCP), STONE BASKET EXTRACTION (N/A) SPHINCTEROTOMY (N/A) BALLOON DILATION (N/A) BILIARY STENT PLACEMENT (N/A) SPYGLASS CHOLANGIOSCOPY (N/A)  Patient Location: PACU  Anesthesia Type:General  Level of Consciousness: awake, oriented and patient cooperative  Airway and Oxygen Therapy: Patient Spontanous Breathing and Patient connected to nasal cannula oxygen  Post-op Pain: none  Post-op Assessment: Post-op Vital signs reviewed, Patient's Cardiovascular Status Stable, Respiratory Function Stable, Patent Airway and No signs of Nausea or vomiting  Post-op Vital Signs: Reviewed and stable  Last Vitals:  Filed Vitals:   02/02/14 1420  BP: 154/113  Pulse:   Temp:   Resp: 63    Complications: No apparent anesthesia complications

## 2014-02-02 NOTE — OR Nursing (Signed)
SS called in regards to obtaining POA paper work ,  Message left with Elsie Lincoln to send / fax to [phone].

## 2014-02-02 NOTE — Anesthesia Procedure Notes (Signed)
Procedure Name: Intubation Date/Time: 02/02/2014 2:35 PM Performed by: Charmaine Downs Pre-anesthesia Checklist: Patient being monitored, Suction available, Emergency Drugs available and Patient identified Patient Re-evaluated:Patient Re-evaluated prior to inductionOxygen Delivery Method: Circle system utilized Preoxygenation: Pre-oxygenation with 100% oxygen Intubation Type: IV induction, Rapid sequence and Cricoid Pressure applied Ventilation: Mask ventilation with difficulty Laryngoscope Size: Mac and 3 Grade View: Grade I Tube type: Oral Tube size: 6.0 mm Number of attempts: 1 Airway Equipment and Method: Stylet Placement Confirmation: ETT inserted through vocal cords under direct vision,  positive ETCO2 and breath sounds checked- equal and bilateral Secured at: 20 cm Tube secured with: Tape Dental Injury: Teeth and Oropharynx as per pre-operative assessment  Difficulty Due To: Difficult Airway- due to dentition Comments: Lack of teeth made for difficult mask fit

## 2014-02-02 NOTE — Progress Notes (Signed)
Hypoglycemic Event  CBG: CBG=61  Treatment: D50 IV 25 mL  Symptoms: None  Follow-up CBG: Time:1314 CBG Result:121  Possible Reasons for Event: Inadequate meal intake  Comments/MD notified:Dr. Sarajane Jews paged and made aware.    Hinata Diener, Francetta Found  Remember to initiate Hypoglycemia Order Set & complete

## 2014-02-02 NOTE — Progress Notes (Signed)
I placed a call to Betsey Amen, DSS Guardian, to discuss need for consent for ERCP. Left message to return call to me. Abbie Sons in endo aware of consent issues.   I have spoken to patient and daughter, [name] (per patient's request). Explained procedure at length with regards to risks and benefits. See consult note.

## 2014-02-02 NOTE — Progress Notes (Signed)
INITIAL NUTRITION ASSESSMENT  DOCUMENTATION CODES Per approved criteria  -Underweight   INTERVENTION: Follow for diet advancement  NUTRITION DIAGNOSIS: Inadequate oral intake related to altered GI function as evidenced by NPO.   Goal: Pt will meet >90% of estimated nutritional needs  Monitor:  Diet advancement, PO intake, labs, weight changes, I/O's  Reason for Assessment: MST=3  80 y.o. female  Admitting Dx: Cholangitis  80 year old woman sent to the emergency department for abdominal pain, low-grade fever. Evaluation revealed dilatation of the hepatic ducts and distal common bile duct stone. Patient admitted for cholangitis, choledocholithiasis, GI consultation.  ASSESSMENT: Pt is a resident of [facility], who was admitted for cholangitis. Pt is a ward of the state.  Pt was being taken down to OR at time of visit. Unable to complete nutrition focused physical exam at this time.  Documented wt hx reveals a progressive wt loss over the past year, including an 8% wt loss x 2 months.  Suspect poor po intake due to wt loss. UBW around 100#. MAR reveals she does not use nutrition supplements PTA, however, she would benefit from a supplement with diet advancement due to weight loss and underweight status.  Labs reviewed. K: 3.4. CBGS: 61-80.  Height: Ht Readings from Last 1 Encounters:  02/01/14 5\' 2"  (1.575 m)    Weight: Wt Readings from Last 1 Encounters:  02/01/14 92 lb 9.5 oz (42 kg)    Ideal Body Weight: 110#  % Ideal Body Weight: 84%  Wt Readings from Last 10 Encounters:  02/01/14 92 lb 9.5 oz (42 kg)  02/01/14 92 lb 9.5 oz (42 kg)  12/14/13 100 lb (45.36 kg)  09/13/13 97 lb 1.6 oz (44.044 kg)  01/26/13 99 lb 4.8 oz (45.042 kg)  07/14/12 97 lb 8 oz (44.226 kg)  08/14/10 100 lb 8 oz (45.587 kg)    Usual Body Weight: 100#  % Usual Body Weight: 92%  BMI:  Body mass index is 16.93 kg/(m^2). Underweight  Estimated Nutritional Needs: Kcal:  1260-1470 Protein: 50-60 grams Fluid: 1.3-1.5 L  Skin: WDL  Diet Order: NPO  EDUCATION NEEDS: -Education not appropriate at this time  No intake or output data in the 24 hours ending 02/02/14 1239  Last BM: 02/02/14  Labs:   Recent Labs Lab 02/01/14 1630 02/02/14 0416  NA 141 142  K 4.1 3.4*  CL 96 100  CO2 29 29  BUN 26* 20  CREATININE 1.02 0.99  CALCIUM 9.6 8.7  GLUCOSE 168* 94    CBG (last 3)   Recent Labs  02/02/14 0932 02/02/14 1125  GLUCAP 80 61*    Scheduled Meds: . ampicillin-sulbactam (UNASYN) IV  1.5 g Intravenous Q6H  . calcium-vitamin D  1 tablet Oral Q breakfast  . darifenacin  7.5 mg Oral Daily  . docusate sodium  100 mg Oral Q12H  . insulin aspart  0-9 Units Subcutaneous Q6H  . levothyroxine  25 mcg Oral QAC breakfast  . montelukast  10 mg Oral q morning - 10a  . pantoprazole  40 mg Oral Daily  . sodium chloride  3 mL Intravenous Q12H    Continuous Infusions: . sodium chloride 50 mL/hr at 02/01/14 2359    Past Medical History  Diagnosis Date  . Urinary retention   . Gastritis   . Esophageal ulcer   . Salmonella sepsis   . Degenerative disk disease   . Spinal stenosis   . Asthma   . COPD (chronic obstructive pulmonary disease)   .  Diabetes mellitus, type 2   . Diverticulosis of colon   . GERD (gastroesophageal reflux disease)   . Hypertension   . Osteoarthritis   . Hiatal hernia   . Pancreatitis   . S/P endoscopy 2009    Dr. Oneida Alar: gastritis  . Diverticulitis   . S/P endoscopy March 2012    Dr. Gala Romney: NSAID induced ulcer  . Hypothyroidism   . MGUS (monoclonal gammopathy of unknown significance)     Past Surgical History  Procedure Laterality Date  . Cervical fusion    . Cholecystectomy      Amadou Katzenstein A. Jimmye Norman, RD, LDN Pager: [phone]

## 2014-02-02 NOTE — Progress Notes (Signed)
Patient seen this evening following ERCP today. Complains of mild epigastric pain. Abdomen is soft with only mild epigastric tenderness to palpation.Marland Kitchen Has not required any pain meds. Yet discussed with nursing staff. Afebrile at 1800. Tolerating clear liquids. She will be reassessed tomorrow morning. Hepatic profile pending for for the a.m.

## 2014-02-02 NOTE — Consult Note (Signed)
Referring Provider: Orvan Falconer, MD Primary Care Physician:  [name], MD Primary Gastroenterologist:  Barney Drain, MD  Reason for Consultation:  Cholangitis, choledocholithiasis  HPI: [name] is a 80 y.o. female admitted from [facility] [place] for abdominal pain.  No reported nausea or vomiting. She did have low-grade temp of 100.8. CT A/P abdomen and pelvis yesterday showed common bile duct 13 mm with intrahepatic biliary ductal dilation. Distal common bile duct stone or stones again identified (similar on July 2015 CT). She has increased periportal edema however. She was seen in the ER on July 29 with abdominal pain and had a CT scan at that time which again showed choledocholithiasis. Her LFTs were normal at that time. She presented yesterday her AST was 1058, ALT 349, alkaline phosphatase 549, total bilirubin 1.6. Today her AST is down to 518, ALT 282, alkaline phosphatase 478, total bilirubin 1.5. White blood cell count 14,900. Lipase is normal.  The patient is alert and oriented to person and place. She has DSS guardian, [name] who is currently out of the office. I left a message with [name] at DSS to discuss the consent issues. Spoke with patient's daughter, [name] at patient's request. The patient reports intermittent abdominal pain for quite some time. Denies vomiting. Has a history of constipation. Patient denies blood in the stool, melena, heartburn. She has abdominal pain with meals.   Prior to Admission medications   Medication Sig Start Date End Date Taking? Authorizing Provider  Calcium Citrate-Vitamin D 200-250 MG-UNIT TABS Take 1 tablet by mouth daily.   Yes Historical Provider, MD  docusate sodium (COLACE) 100 MG capsule Take 1 capsule (100 mg total) by mouth every 12 (twelve) hours. 03/02/13  Yes Johnna Acosta, MD  ferrous sulfate 325 (65 FE) MG tablet Take 325 mg by mouth daily with breakfast.   Yes Historical Provider, MD  furosemide (LASIX)  40 MG tablet Take 40 mg by mouth daily.    Yes Historical Provider, MD  HYDROcodone-acetaminophen (NORCO/VICODIN) 5-325 MG per tablet Take 1 tablet by mouth every 6 (six) hours as needed for moderate pain.   Yes Historical Provider, MD  levothyroxine (SYNTHROID, LEVOTHROID) 25 MCG tablet Take 25 mcg by mouth daily before breakfast.   Yes Historical Provider, MD  montelukast (SINGULAIR) 10 MG tablet Take 10 mg by mouth every morning.    Yes Historical Provider, MD  Multiple Vitamin (DAILY VITAMIN PO) Take 1 tablet by mouth daily.   Yes Historical Provider, MD  pantoprazole (PROTONIX) 40 MG tablet Take 40 mg by mouth daily.    Yes Historical Provider, MD  solifenacin (VESICARE) 5 MG tablet Take 5 mg by mouth daily.   Yes Historical Provider, MD  theophylline (THEODUR) 200 MG 12 hr tablet Take 200 mg by mouth 2 (two) times daily.    Yes Historical Provider, MD  zolpidem (AMBIEN) 5 MG tablet Take 5 mg by mouth at bedtime as needed for sleep.    Yes Historical Provider, MD    Current Facility-Administered Medications  Medication Dose Route Frequency Provider Last Rate Last Dose  . 0.9 %  sodium chloride infusion   Intravenous Continuous [name], MD 50 mL/hr at 02/01/14 2359    . ampicillin-sulbactam (UNASYN) 1.5 g in sodium chloride 0.9 % 50 mL IVPB  1.5 g Intravenous Q6H Orvan Falconer, MD   1.5 g at 02/02/14 8299  . calcium-vitamin D (OSCAL WITH D) 500-200 MG-UNIT per tablet 1 tablet  1 tablet Oral Q  breakfast Orvan Falconer, MD      . darifenacin (ENABLEX) 24 hr tablet 7.5 mg  7.5 mg Oral Daily Orvan Falconer, MD      . docusate sodium (COLACE) capsule 100 mg  100 mg Oral Q12H Orvan Falconer, MD      . heparin injection 5,000 Units  5,000 Units Subcutaneous 3 times per day Orvan Falconer, MD   5,000 Units at 02/02/14 [phone]  . levothyroxine (SYNTHROID, LEVOTHROID) tablet 25 mcg  25 mcg Oral QAC breakfast Orvan Falconer, MD   25 mcg at 02/02/14 0743  . montelukast (SINGULAIR) tablet 10 mg  10 mg Oral q morning - 10a Orvan Falconer, MD       . morphine 2 MG/ML injection 1 mg  1 mg Intravenous Q3H PRN Orvan Falconer, MD      . ondansetron [facility]) tablet 4 mg  4 mg Oral Q6H PRN Orvan Falconer, MD       Or  . ondansetron [facility]) injection 4 mg  4 mg Intravenous Q6H PRN Orvan Falconer, MD      . pantoprazole (PROTONIX) EC tablet 40 mg  40 mg Oral Daily Orvan Falconer, MD      . sodium chloride 0.9 % injection 3 mL  3 mL Intravenous Q12H Orvan Falconer, MD   3 mL at 02/02/14 0000  . zolpidem (AMBIEN) tablet 5 mg  5 mg Oral QHS PRN Orvan Falconer, MD        Allergies as of 02/01/2014  . (No Known Allergies)    Past Medical History  Diagnosis Date  . Urinary retention   . Gastritis   . Esophageal ulcer   . Salmonella sepsis   . Degenerative disk disease   . Spinal stenosis   . Asthma   . COPD (chronic obstructive pulmonary disease)   . Diabetes mellitus, type 2   . Diverticulosis of colon   . GERD (gastroesophageal reflux disease)   . Hypertension   . Osteoarthritis   . Hiatal hernia   . Pancreatitis   . S/P endoscopy 2009    Dr. Oneida Alar: gastritis  . Diverticulitis   . S/P endoscopy March 2012    Dr. Gala Romney: NSAID induced ulcer  . Hypothyroidism   . MGUS (monoclonal gammopathy of unknown significance)     Past Surgical History  Procedure Laterality Date  . Cervical fusion    . Cholecystectomy      Family History  Problem Relation Age of Onset  . Diabetes Mother     History   Social History  . Marital Status: Widowed    Spouse Name: N/A    Number of Children: N/A  . Years of Education: N/A   Occupational History  . Not on file.   Social History Main Topics  . Smoking status: Never Smoker   . Smokeless tobacco: Never Used  . Alcohol Use: No  . Drug Use: No  . Sexual Activity: Not on file   Other Topics Concern  . Not on file   Social History Narrative  . No narrative on file     ROS:  General: See history of present illness Eyes: Negative for vision changes.  ENT: Negative for hoarseness, difficulty swallowing ,  nasal congestion. CV: Negative for chest pain, angina, palpitations, dyspnea on exertion, peripheral edema.  Respiratory: Negative for dyspnea at rest, dyspnea on exertion, cough, sputum, wheezing.  GI: See history of present illness. GU:  Negative for dysuria, hematuria, urinary incontinence, urinary frequency, nocturnal urination.  MS: Negative  for joint pain, low back pain.  Derm: Negative for rash or itching.  Neuro: Negative for weakness, abnormal sensation, seizure, frequent headaches, memory loss, confusion.  Psych: Negative for anxiety, depression, suicidal ideation, hallucinations.  Endo: Negative for unusual weight change.  Heme: Negative for bruising or bleeding. Allergy: Negative for rash or hives.       Physical Examination: Vital signs in last 24 hours: Temp:  [98 F (36.7 C)-100.8 F (38.2 C)] 98 F (36.7 C) (09/17 0742) Pulse Rate:  [86-110] 86 (09/16 2329) Resp:  [16-24] 22 (09/16 2329) BP: (134-176)/(62-81) 143/67 mmHg (09/16 2319) SpO2:  [85 %-100 %] 100 % (09/16 2329) Weight:  [92 lb 9.5 oz (42 kg)] 92 lb 9.5 oz (42 kg) (09/16 2319) Last BM Date: 02/02/14  General: Well-nourished, well-developed in no acute distress.  Head: Normocephalic, atraumatic.   Eyes: Conjunctiva pink, no icterus. Mouth: Oropharyngeal mucosa moist and pink , no lesions erythema or exudate. Neck: Supple without thyromegaly, masses, or lymphadenopathy.  Lungs: Clear to auscultation bilaterally.  Heart: Regular rate and rhythm, no murmurs rubs or gallops.  Abdomen: Bowel sounds are normal, moderate right upper quadrant/epigastric, nondistended, no hepatosplenomegaly or masses, no abdominal bruits or    hernia , no rebound or guarding.   Rectal: Not performed Extremities: No lower extremity edema, clubbing, deformity.  Neuro: Alert and oriented x 4 , grossly normal neurologically.  Skin: Warm and dry, no rash or jaundice.   Psych: Alert and cooperative, normal mood and affect.         Intake/Output from previous day:   Intake/Output this shift:    Lab Results: CBC  Recent Labs  02/01/14 1630 02/02/14 0416  WBC 14.0* 14.9*  HGB 13.4 11.7*  HCT 39.5 33.9*  MCV 96.8 94.2  PLT PLATELET CLUMPS NOTED ON SMEAR, COUNT APPEARS ADEQUATE 188   BMET  Recent Labs  02/01/14 1630 02/02/14 0416  NA 141 142  K 4.1 3.4*  CL 96 100  CO2 29 29  GLUCOSE 168* 94  BUN 26* 20  CREATININE 1.02 0.99  CALCIUM 9.6 8.7   LFT  Recent Labs  02/01/14 1630 02/02/14 0416  BILITOT 1.6* 1.5*  BILIDIR 1.2*  --   IBILI 0.4  --   ALKPHOS 549* 478*  AST 1058* 518*  ALT 349* 282*  PROT 7.2 6.2  ALBUMIN 3.5 2.9*    Lipase  Recent Labs  02/01/14 1630  LIPASE 11    PT/INR No results found for this basename: LABPROT, INR,  in the last 72 hours    Imaging Studies: Ct Abdomen Pelvis W Contrast  02/01/2014   CLINICAL DATA:  Gastritis, COPD, diverticulosis, hypertension, pancreatitis. Previous cholecystectomy. Abdominal pain.  EXAM: CT ABDOMEN AND PELVIS WITH CONTRAST  TECHNIQUE: Multidetector CT imaging of the abdomen and pelvis was performed using the standard protocol following bolus administration of intravenous contrast.  CONTRAST:  19mL OMNIPAQUE IOHEXOL 300 MG/ML SOLN, 68mL OMNIPAQUE IOHEXOL 300 MG/ML SOLN  COMPARISON:  CT of abdomen and pelvis on 12/14/2013  FINDINGS: Lower chest: Lung bases are unremarkable. Coronary artery calcification his significant. Heart is normal in size.  Upper abdomen: There is intrahepatic biliary ductal dilatation. Common bile duct is also dilated, measuring 13 mm. A distal common bile duct stone or stones again identified, similar in appearance to prior study. Since prior study, there has development of increased periportal edema. Status post cholecystectomy. There is no focal liver lesion. No focal abnormality identified within the spleen, pancreas, or adrenal glands. There  are bilateral renal cysts. Small amount of fluid is identified around  the spleen.  Bowel: Stomach and small bowel loops are normal in appearance. There are numerous colonic diverticula.  Pelvis: The uterus is present. No adnexal mass identified. No free pelvic fluid.  Retroperitoneum: There is atherosclerotic calcification of the abdominal aorta. No aneurysm.  Abdominal wall: Unremarkable  Osseous structures: There are significant degenerative changes in the lower lumbar spine with anterolisthesis of L3 on L4, L4 on L5, and L5 on S1. No suspicious lytic or blastic lesions.  IMPRESSION: 1. Intra and extrahepatic biliary ductal dilatation with distal common bile duct stones. Increased periportal edema since prior study. 2. Status post cholecystectomy. 3. Significant colonic diverticulosis. No evidence for acute diverticulitis. 4. Coronary artery calcification.   Electronically Signed   By: Shon Hale M.D.   On: 02/01/2014 20:22   Dg Chest Portable 1 View  02/01/2014   CLINICAL DATA:  Chest pain  EXAM: PORTABLE CHEST - 1 VIEW  COMPARISON:  01/22/2008  FINDINGS: Cardiac shadow is within normal limits. The lungs are well aerated bilaterally. A prominent skin fold is noted over the left chest. Lung markings are noted beyond this region. No bony abnormality is seen.  IMPRESSION: Prominent skin fold low over the lateral left lung. No acute abnormality is noted.   Electronically Signed   By: Inez Catalina M.D.   On: 02/01/2014 16:09  [4 week]   Impression: 80 year old lady who presents with recurrent abdominal pain. Evidence of choledocholithiasis on T. recent CT scans as outlined above. Now with increased periportal edema and abnormal LFTs. Suspect element of cholangitis given fever and leukocytosis. Discussed at length with patient and patient's daughter [name]. Patient needs decompression of her biliary tree, stone removal via ERCP.  The risks, benefits, limitations, alternatives, and imponderables have been reviewed with the patient. I specifically discussed a1 in 10 chance  of pancreatitis, reaction to medications, bleeding, perforation and the possibility of a failed ERCP. Potential for sphincterotomy and stent placement also reviewed. Questions have been answered. All parties agreeable.  Patient has [place] guardian. Discussed with [name] Sons, RN in endo. I am waiting for return call regarding consent issues.   Patient also received heparin at 630am this morning, therefore procedure would be this afternoon at the earliest.    Plan: 1. D/C heparin in preparation for procedure. SCDs added.  2. Hopefully ERCP later today.   We would like to thank you for the opportunity to participate in the care of NAKHIA LEVITAN.    LOS: 1 day   Neil Crouch  02/02/2014, 8:04 AM  Attending note:  Patient seen and examined. CT reviewed. Patient presents with cholangitis secondary to choledocholithiasis producing extra-hepatic biliary obstruction.  Agree with need for ERCP with biliary decompression. Risks, benefits, limitations, imponderables and alternatives have been reviewed. The potential for a followup ERCP if stent is placed also reviewed. All parties questions answered. All parties agreeable.

## 2014-02-02 NOTE — Care Management Note (Addendum)
    Page 1 of 1   02/08/2014     4:35:24 PM CARE MANAGEMENT NOTE 02/08/2014  Patient:  [name], [name]   Account Number:  [id number]  Date Initiated:  02/02/2014  Documentation initiated by:  Theophilus Kinds  Subjective/Objective Assessment:   Pt admitted from [facility] with abd pain and biliary obstruction. Pt will return to facility at discharge.     Action/Plan:   CSW to arrange discharge to facility when medically stable.   Anticipated DC Date:  02/08/2014   Anticipated DC Plan:  ASSISTED LIVING / REST HOME  In-house referral  Clinical Social Worker      DC Forensic scientist  CM consult      [facility] Choice  HOME HEALTH   Choice offered to / List presented to:  C-1 Patient        [place] arranged  HH-1 RN  [place].   Status of service:  Completed, signed off Medicare Important Message given?  YES (If response is "NO", the following Medicare IM given date fields will be blank) Date Medicare IM given:  02/03/2014 Medicare IM given by:  Vladimir Creeks Date Additional Medicare IM given:  02/08/2014 Additional Medicare IM given by:  Vladimir Creeks  Discharge Disposition:  [place]  Per UR Regulation:  Reviewed for med. necessity/level of care/duration of stay  If discussed at [place] of Stay Meetings, dates discussed:    Comments:  02/08/14 [place] RN/CM pt to return to ALF with [facility]. They use [facility] and pt is good with this, but then pt had an episode of starring into space, unresponsiveness, and will not be D/C today. CSW aware and notified ALF 02/02/14 [place], RN BSN CM

## 2014-02-03 ENCOUNTER — Encounter (HOSPITAL_COMMUNITY): Payer: Self-pay | Admitting: Internal Medicine

## 2014-02-03 LAB — GLUCOSE, CAPILLARY
GLUCOSE-CAPILLARY: 107 mg/dL — AB (ref 70–99)
GLUCOSE-CAPILLARY: 115 mg/dL — AB (ref 70–99)
Glucose-Capillary: 107 mg/dL — ABNORMAL HIGH (ref 70–99)
Glucose-Capillary: 68 mg/dL — ABNORMAL LOW (ref 70–99)
Glucose-Capillary: 90 mg/dL (ref 70–99)

## 2014-02-03 LAB — HEPATIC FUNCTION PANEL
ALT: 131 U/L — ABNORMAL HIGH (ref 0–35)
AST: 128 U/L — ABNORMAL HIGH (ref 0–37)
Albumin: 2.3 g/dL — ABNORMAL LOW (ref 3.5–5.2)
Alkaline Phosphatase: 364 U/L — ABNORMAL HIGH (ref 39–117)
Bilirubin, Direct: 0.5 mg/dL — ABNORMAL HIGH (ref 0.0–0.3)
Indirect Bilirubin: 0.5 mg/dL (ref 0.3–0.9)
Total Bilirubin: 1 mg/dL (ref 0.3–1.2)
Total Protein: 5.4 g/dL — ABNORMAL LOW (ref 6.0–8.3)

## 2014-02-03 MED ORDER — AMOXICILLIN-POT CLAVULANATE 500-125 MG PO TABS
1.0000 | ORAL_TABLET | Freq: Two times a day (BID) | ORAL | Status: DC
  Administered 2014-02-04 – 2014-02-09 (×10): 500 mg via ORAL
  Filled 2014-02-03 (×11): qty 1

## 2014-02-03 MED ORDER — SODIUM CHLORIDE 0.9 % IV SOLN
1.5000 g | Freq: Four times a day (QID) | INTRAVENOUS | Status: AC
  Administered 2014-02-03 – 2014-02-04 (×2): 1.5 g via INTRAVENOUS
  Filled 2014-02-03 (×2): qty 1.5

## 2014-02-03 MED ORDER — DOCUSATE SODIUM 100 MG PO CAPS
100.0000 mg | ORAL_CAPSULE | Freq: Two times a day (BID) | ORAL | Status: DC
  Administered 2014-02-04 – 2014-02-09 (×9): 100 mg via ORAL
  Filled 2014-02-03 (×11): qty 1

## 2014-02-03 MED ORDER — AMOXICILLIN-POT CLAVULANATE 500-125 MG PO TABS: 1.0000 | ORAL_TABLET | Freq: Two times a day (BID) | ORAL | Status: DC

## 2014-02-03 MED ORDER — PANTOPRAZOLE SODIUM 40 MG PO TBEC
40.0000 mg | DELAYED_RELEASE_TABLET | Freq: Every day | ORAL | Status: DC
  Administered 2014-02-04 – 2014-02-05 (×2): 40 mg via ORAL
  Filled 2014-02-03 (×2): qty 1

## 2014-02-03 NOTE — Progress Notes (Signed)
  PROGRESS NOTE  [name] ZOX:[id number] DOB: [birth year] DOA: 02/01/2014 PCP: [name], MD  Summary: 80 year old woman sent to the emergency department for abdominal pain, low-grade fever. Evaluation revealed dilatation of the hepatic ducts and distal common bile duct stone. Patient admitted for cholangitis, choledocholithiasis, GI consultation.  Assessment/Plan: 1. Choledocholithiasis, suspected acute cholangitis. Improving rapidly status post ERCP. Afebrile, hemodynamic stable. 2. Diabetes mellitus type 2. Stable. 3. History of COPD.   Overall improving. Discussed with gastroenterology. Likely transition to oral antibiotics tomorrow and discharge.  Left message for guardian  Code Status: full code DVT prophylaxis: heparin Family Communication:  Disposition Plan: return to [facility], [place]  [facility]  Pager [phone] If 7PM-7AM, please contact night-coverage at [email or web address], password [facility] 02/03/2014, 9:16 AM  LOS: 2 days   Consultants:  Gastroenterology  Procedures:  9/17 ERCP IMPRESSION: Juxtampullary diverticula, massively dilated bile duct. Choledocholithiasis. Status post biliary sphincterotomy. Balloon and basket stone extraction and stent placement. Attempted cholangioscopy as described above.  Antibiotics:  Unasyn 9/16 >>   HPI/Subjective: Feels better today. No noncompliance. Mild abdominal pain.  Objective: Filed Vitals:   02/02/14 1755 02/02/14 1828 02/02/14 2358 02/03/14 0717  BP: 129/54 140/54 157/59 147/63  Pulse: 88 85 87 85  Temp: 98.2 F (36.8 C)  98.6 F (37 C) 98.3 F (36.8 C)  TempSrc:   Oral Oral  Resp: 18 17 18 18   Height:      Weight:      SpO2: 95% 95% 95% 96%    Intake/Output Summary (Last 24 hours) at 02/03/14 0916 Last data filed at 02/02/14 1857  Gross per 24 hour  Intake   2000 ml  Output      0 ml  Net   2000 ml     Filed Weights   02/01/14 2319  Weight: 42 kg (92 lb 9.5  oz)    Exam:     Afebrile, vital signs stable. Hypoxia.  Appears calm, comfortable. Speech fluent and clear.  Respiratory clear to auscultation bilaterally. No wheezes, rales or rhonchi. Normal respiratory effort.  Cardiovascular regular rate and rhythm. No murmur, rub or gallop. No lower extremity edema.  Abdomen soft. Nontender.  Data Reviewed:  LFTs trending down. Bilirubin has normalized.  Scheduled Meds: . ampicillin-sulbactam (UNASYN) IV  1.5 g Intravenous Q6H  . calcium-vitamin D  1 tablet Oral Q breakfast  . darifenacin  7.5 mg Oral Daily  . docusate sodium  100 mg Oral Q12H  . insulin aspart  0-9 Units Subcutaneous Q6H  . levothyroxine  25 mcg Oral QAC breakfast  . montelukast  10 mg Oral q morning - 10a  . pantoprazole  40 mg Oral Daily  . sodium chloride  3 mL Intravenous Q12H   Continuous Infusions: . sodium chloride 50 mL/hr at 02/02/14 1830    Principal Problem:   Cholangitis Active Problems:   DIABETES MELLITUS, TYPE II   HYPERTENSION   MGUS (monoclonal gammopathy of unknown significance)   Cholelithiasis with biliary obstruction   Biliary obstruction   Time spent 15 minutes

## 2014-02-03 NOTE — Anesthesia Postprocedure Evaluation (Signed)
  Anesthesia Post-op Note  Patient: [name]  Procedure(s) Performed: Procedure(s): ENDOSCOPIC RETROGRADE CHOLANGIOPANCREATOGRAPHY (ERCP), STONE BASKET EXTRACTION (N/A) SPHINCTEROTOMY (N/A) BALLOON DILATION (N/A) BILIARY STENT PLACEMENT (N/A) SPYGLASS CHOLANGIOSCOPY (N/A)  Patient Location: room 333  Anesthesia Type:General  Level of Consciousness: awake, alert  and patient cooperative  Airway and Oxygen Therapy: Patient Spontanous Breathing  Post-op Pain: none  Post-op Assessment: Post-op Vital signs reviewed, Patient's Cardiovascular Status Stable, Respiratory Function Stable, Patent Airway and No signs of Nausea or vomiting  Post-op Vital Signs: Reviewed and stable  Last Vitals:  Filed Vitals:   02/03/14 0717  BP: 147/63  Pulse: 85  Temp: 36.8 C  Resp: 18    Complications: No apparent anesthesia complications

## 2014-02-03 NOTE — Clinical Social Work Note (Signed)
Anticipate d/c tomorrow per MD back to [facility]. Facility aware and agreeable. Amoxicillin script sent to [place] at [facility] request and confirmed with pharmacy it was received. Melissa Price at [facility] notified of above.    [name], [place]

## 2014-02-03 NOTE — Progress Notes (Signed)
REVIEWED-NO ADDITIONAL RECOMMENDATIONS. 

## 2014-02-03 NOTE — Progress Notes (Signed)
Subjective:  Sore. Tolerating clear liquids. Granddaughter at bedside.   Objective: Vital signs in last 24 hours: Temp:  [97.7 F (36.5 C)-98.6 F (37 C)] 98.3 F (36.8 C) (09/18 0717) Pulse Rate:  [77-90] 85 (09/18 0717) Resp:  [13-63] 18 (09/18 0717) BP: (119-182)/(54-113) 147/63 mmHg (09/18 0717) SpO2:  [90 %-100 %] 96 % (09/18 0717) Last BM Date: 02/03/14 General:   Alert,  Well-developed, well-nourished, pleasant and cooperative in NAD Head:  Normocephalic and atraumatic. Eyes:  Sclera clear, no icterus.   Abdomen:  Soft, minimal epig tenderness and nondistended.  Normal bowel sounds, without guarding, and without rebound.   Extremities:  Without clubbing, deformity or edema. Neurologic:  Alert and  oriented x4;  grossly normal neurologically. Skin:  Intact without significant lesions or rashes. Psych:  Alert and cooperative. Normal mood and affect.  Intake/Output from previous day: 09/17 0701 - 09/18 0700 In: 2000 [I.V.:1850; IV Piggyback:150] Out: -  Intake/Output this shift: Total I/O In: 60 [P.O.:60] Out: -   Lab Results: CBC  Recent Labs  02/01/14 1630 02/02/14 0416  WBC 14.0* 14.9*  HGB 13.4 11.7*  HCT 39.5 33.9*  MCV 96.8 94.2  PLT PLATELET CLUMPS NOTED ON SMEAR, COUNT APPEARS ADEQUATE 188   BMET  Recent Labs  02/01/14 1630 02/02/14 0416  NA 141 142  K 4.1 3.4*  CL 96 100  CO2 29 29  GLUCOSE 168* 94  BUN 26* 20  CREATININE 1.02 0.99  CALCIUM 9.6 8.7   LFTs  Recent Labs  02/01/14 1630 02/02/14 0416 02/03/14 0815  BILITOT 1.6* 1.5* 1.0  BILIDIR 1.2*  --  0.5*  IBILI 0.4  --  0.5  ALKPHOS 549* 478* 364*  AST 1058* 518* 128*  ALT 349* 282* 131*  PROT 7.2 6.2 5.4*  ALBUMIN 3.5 2.9* 2.3*    Recent Labs  02/01/14 1630  LIPASE 11   PT/INR No results found for this basename: LABPROT, INR,  in the last 72 hours    Imaging Studies: Ct Abdomen Pelvis W Contrast  02/01/2014   CLINICAL DATA:  Gastritis, COPD, diverticulosis,  hypertension, pancreatitis. Previous cholecystectomy. Abdominal pain.  EXAM: CT ABDOMEN AND PELVIS WITH CONTRAST  TECHNIQUE: Multidetector CT imaging of the abdomen and pelvis was performed using the standard protocol following bolus administration of intravenous contrast.  CONTRAST:  71mL OMNIPAQUE IOHEXOL 300 MG/ML SOLN, 14mL OMNIPAQUE IOHEXOL 300 MG/ML SOLN  COMPARISON:  CT of abdomen and pelvis on 12/14/2013  FINDINGS: Lower chest: Lung bases are unremarkable. Coronary artery calcification his significant. Heart is normal in size.  Upper abdomen: There is intrahepatic biliary ductal dilatation. Common bile duct is also dilated, measuring 13 mm. A distal common bile duct stone or stones again identified, similar in appearance to prior study. Since prior study, there has development of increased periportal edema. Status post cholecystectomy. There is no focal liver lesion. No focal abnormality identified within the spleen, pancreas, or adrenal glands. There are bilateral renal cysts. Small amount of fluid is identified around the spleen.  Bowel: Stomach and small bowel loops are normal in appearance. There are numerous colonic diverticula.  Pelvis: The uterus is present. No adnexal mass identified. No free pelvic fluid.  Retroperitoneum: There is atherosclerotic calcification of the abdominal aorta. No aneurysm.  Abdominal wall: Unremarkable  Osseous structures: There are significant degenerative changes in the lower lumbar spine with anterolisthesis of L3 on L4, L4 on L5, and L5 on S1. No suspicious lytic or blastic lesions.  IMPRESSION: 1. Intra  and extrahepatic biliary ductal dilatation with distal common bile duct stones. Increased periportal edema since prior study. 2. Status post cholecystectomy. 3. Significant colonic diverticulosis. No evidence for acute diverticulitis. 4. Coronary artery calcification.   Electronically Signed   By: Shon Hale M.D.   On: 02/01/2014 20:22   Dg Chest Portable 1  View  02/01/2014   CLINICAL DATA:  Chest pain  EXAM: PORTABLE CHEST - 1 VIEW  COMPARISON:  01/22/2008  FINDINGS: Cardiac shadow is within normal limits. The lungs are well aerated bilaterally. A prominent skin fold is noted over the left chest. Lung markings are noted beyond this region. No bony abnormality is seen.  IMPRESSION: Prominent skin fold low over the lateral left lung. No acute abnormality is noted.   Electronically Signed   By: Inez Catalina M.D.   On: 02/01/2014 16:09   Dg Ercp  02/02/2014   CLINICAL DATA:  Choledocholithiasis and cholangitis.  EXAM: ERCP  TECHNIQUE: Multiple spot images obtained with the fluoroscopic device and submitted for interpretation post-procedure.  COMPARISON:  CT on 02/01/2014.  FINDINGS: Images obtained with a C-arm during the ERCP procedure demonstrate cannulation of the common bile duct. Cholangiogram shows a tortuous and massively dilated common bile duct as well as intrahepatic biliary ductal dilatation. The opacified common bile duct shows irregular filling defects. A balloon sweep maneuver was performed.  IMPRESSION: Choledocholithiasis with massive common bile duct dilatation. A balloon sweep maneuver was performed.  These images were submitted for radiologic interpretation only. Please see the procedural report for the amount of contrast and the fluoroscopy time utilized.   Electronically Signed   By: Aletta Edouard M.D.   On: 02/02/2014 17:01  [2 weeks]   Assessment: 80 y/o female s/p ERCP with biliary sphincterotomy, stone extraction and biliary stent placement for choledocholithiasis/cholangitis. Clinically doing well this morning. LFTs improved.  Plan: 1. Consider advancing diet later today. 2. Recheck LFTs in AM. 3. Continue Unasyn for now. 4. She will need repeat ERCP with stent removal.    LOS: 2 days   Neil Crouch  02/03/2014, 12:45 PM

## 2014-02-04 ENCOUNTER — Inpatient Hospital Stay (HOSPITAL_COMMUNITY): Payer: PRIVATE HEALTH INSURANCE

## 2014-02-04 DIAGNOSIS — K8051 Calculus of bile duct without cholangitis or cholecystitis with obstruction: Secondary | ICD-10-CM

## 2014-02-04 DIAGNOSIS — K8309 Other cholangitis: Secondary | ICD-10-CM

## 2014-02-04 LAB — CBC
HCT: 36.3 % (ref 36.0–46.0)
Hemoglobin: 12.4 g/dL (ref 12.0–15.0)
MCH: 32.5 pg (ref 26.0–34.0)
MCHC: 34.2 g/dL (ref 30.0–36.0)
MCV: 95.3 fL (ref 78.0–100.0)
Platelets: 177 10*3/uL (ref 150–400)
RBC: 3.81 MIL/uL — ABNORMAL LOW (ref 3.87–5.11)
RDW: 14.4 % (ref 11.5–15.5)
WBC: 12.3 10*3/uL — AB (ref 4.0–10.5)

## 2014-02-04 LAB — GLUCOSE, CAPILLARY
GLUCOSE-CAPILLARY: 80 mg/dL (ref 70–99)
GLUCOSE-CAPILLARY: 92 mg/dL (ref 70–99)
Glucose-Capillary: 100 mg/dL — ABNORMAL HIGH (ref 70–99)
Glucose-Capillary: 98 mg/dL (ref 70–99)
Glucose-Capillary: 102 mg/dL — ABNORMAL HIGH (ref 70–99)

## 2014-02-04 LAB — HEPATIC FUNCTION PANEL
ALBUMIN: 2.4 g/dL — AB (ref 3.5–5.2)
ALK PHOS: 328 U/L — AB (ref 39–117)
ALT: 100 U/L — AB (ref 0–35)
AST: 59 U/L — ABNORMAL HIGH (ref 0–37)
Bilirubin, Direct: 0.2 mg/dL (ref 0.0–0.3)
Indirect Bilirubin: 0.5 mg/dL (ref 0.3–0.9)
Total Bilirubin: 0.7 mg/dL (ref 0.3–1.2)
Total Protein: 6.1 g/dL (ref 6.0–8.3)

## 2014-02-04 LAB — LIPASE, BLOOD: Lipase: 56 U/L (ref 11–59)

## 2014-02-04 MED ORDER — IOHEXOL 300 MG/ML  SOLN
100.0000 mL | Freq: Once | INTRAMUSCULAR | Status: AC | PRN
  Administered 2014-02-04: 100 mL via INTRAVENOUS

## 2014-02-04 MED ORDER — SODIUM CHLORIDE 0.9 % IV SOLN: INTRAVENOUS | Status: DC

## 2014-02-04 MED ORDER — ACETAMINOPHEN 325 MG PO TABS
650.0000 mg | ORAL_TABLET | Freq: Four times a day (QID) | ORAL | Status: DC | PRN
  Administered 2014-02-04 – 2014-02-08 (×2): 650 mg via ORAL
  Filled 2014-02-04 (×2): qty 2

## 2014-02-04 NOTE — Progress Notes (Signed)
  PROGRESS NOTE  [name] EHM:[id number] DOB: 30-Apr-1913 DOA: 02/01/2014 PCP: [name], MD  Summary: 80 year old woman sent to the emergency department for abdominal pain, low-grade fever. Evaluation revealed dilatation of the hepatic ducts and distal common bile duct stone. Patient admitted for cholangitis, choledocholithiasis, GI consultation.  Assessment/Plan: 1. Choledocholithiasis, suspected acute cholangitis, status post ERCP with biliary sphincterotomy, stone extraction and biliary stent placement.  Laboratories studies continue to improve. Tolerating diet. 2. Diabetes mellitus type 2. Remained stable. 3. History of COPD.   Change to oral antibiotics, Augmentin.  Will discuss with GI today.  Repeat ERCP with stent removal in future  Anticipate discharge later today versus tomorrow.  Murray Hodgkins, MD  [facility]  Pager [phone] If 7PM-7AM, please contact night-coverage at [email or web address], password [facility] 02/04/2014, 9:38 AM  LOS: 3 days   Consultants:  Gastroenterology  Procedures:  9/17 ERCP IMPRESSION: Juxtampullary diverticula, massively dilated bile duct. Choledocholithiasis. Status post biliary sphincterotomy. Balloon and basket stone extraction and stent placement. Attempted cholangioscopy as described above.  Antibiotics:  Unasyn 9/16 >> 9/19  Augmentin 9/19 >>  HPI/Subjective: Still has some generalized abdominal pain. No vomiting. Tolerating diet.  Objective: Filed Vitals:   02/03/14 0717 02/03/14 1355 02/04/14 0013 02/04/14 0450  BP: 147/63 125/53 130/64 167/68  Pulse: 85 90 83 80  Temp: 98.3 F (36.8 C) 99.2 F (37.3 C) 98.5 F (36.9 C) 97.7 F (36.5 C)  TempSrc: Oral Oral Oral Oral  Resp: 18 20 15 13   Height:      Weight:    48.353 kg (106 lb 9.6 oz)  SpO2: 96% 95% 94% 95%    Intake/Output Summary (Last 24 hours) at 02/04/14 0938 Last data filed at 02/04/14 0533  Gross per 24 hour  Intake    410 ml  Output       2 ml  Net    408 ml     Filed Weights   02/01/14 2319 02/04/14 0450  Weight: 42 kg (92 lb 9.5 oz) 48.353 kg (106 lb 9.6 oz)    Exam:      Afebrile, vital signs stable. No hypoxia.  Gen. Appears calm, comfortable. Nontoxic.  Psychiatric. Speech fluent, clear.  Respiratory clear to auscultation bilaterally. No wheezes rales or rhonchi. Normal respiratory effort.  Cardiovascular regular rate and rhythm. No murmur, rub or gallop. Abdomen soft, mild generalized tenderness.    Data Reviewed:   Alkaline phosphatase, AST, ALT continue to trend downwards. Bilirubin normal. Lipase normal.  Leukocytosis continues to trend downward.  Scheduled Meds: . amoxicillin-clavulanate  1 tablet Oral BID  . calcium-vitamin D  1 tablet Oral Q breakfast  . darifenacin  7.5 mg Oral Daily  . docusate sodium  100 mg Oral BID  . insulin aspart  0-9 Units Subcutaneous Q6H  . levothyroxine  25 mcg Oral QAC breakfast  . montelukast  10 mg Oral q morning - 10a  . pantoprazole  40 mg Oral QAC breakfast  . sodium chloride  3 mL Intravenous Q12H   Continuous Infusions:    Principal Problem:   Cholangitis Active Problems:   DIABETES MELLITUS, TYPE II   HYPERTENSION   MGUS (monoclonal gammopathy of unknown significance)   Cholelithiasis with biliary obstruction   Biliary obstruction

## 2014-02-04 NOTE — Progress Notes (Addendum)
  Assessment/Plan: ADMITTED WITH CBD STONES, SP/P ERCP/Sx/sTONE EXTRACTION/STENT PLACEMENT, NOW WTH C/O WORSENING ABDOMINAL PAIN.  PLAN: 1. CT ABD/PELVIS W/ IVC AND NO PO CONTRAST TO EVALUATE FOR PERFORATION, DISCUSSED WITH DR.LAWRENCE. 2. HYDRATE OVERNIGHT 3. CONTINUE ABX 4. DYSPHAGIA 2 DIET  ADDNENDUM: 2620 CALLED BY DR. Purcell Nails STENT SITTING IN A DIVERTICULUM. PLAN EGD TO RE-POSITION STENT SEP 20.  Subjective: Since I last evaluated the patient [place]. TOLERATING POs  Objective: Vital signs in last 24 hours: Temp:  [97.7 F (36.5 C)-99.2 F (37.3 C)] 97.7 F (36.5 C) (09/19 0450) Pulse Rate:  [80-90] 80 (09/19 0450) Resp:  [13-20] 13 (09/19 0450) BP: (125-167)/(53-68) 167/68 mmHg (09/19 0450) SpO2:  [94 %-95 %] 95 % (09/19 0450) Weight:  [106 lb 9.6 oz (48.353 kg)] 106 lb 9.6 oz (48.353 kg) (09/19 0450) Last BM Date: 02/03/14  Intake/Output from previous day: 09/18 0701 - 09/19 0700 In: 470 [P.O.:420; IV Piggyback:50] Out: 2 [Urine:1; Stool:1] Intake/Output this shift: Total I/O In: 243 [P.O.:240; I.V.:3] Out: -   General appearance: alert, cooperative and mild distress Resp: clear to auscultation bilaterally Cardio: regular rate and rhythm GI: soft, MILDLY tender INEPIGASTRIUM; bowel sounds normal; MILD DISTENTION,NO REBOUND OR GUARDING  Lab Results:  Recent Labs  02/01/14 1630 02/02/14 0416 02/04/14 0615  WBC 14.0* 14.9* 12.3*  HGB 13.4 11.7* 12.4  HCT 39.5 33.9* 36.3  PLT PLATELET CLUMPS NOTED ON SMEAR, COUNT APPEARS ADEQUATE 188 177   BMET  Recent Labs  02/01/14 1630 02/02/14 0416  NA 141 142  K 4.1 3.4*  CL 96 100  CO2 29 29  GLUCOSE 168* 94  BUN 26* 20  CREATININE 1.02 0.99  CALCIUM 9.6 8.7   LFT  Recent Labs  02/04/14 0615  PROT 6.1  ALBUMIN 2.4*  AST 59*  ALT 100*  ALKPHOS 328*  BILITOT 0.7  BILIDIR 0.2  IBILI 0.5    Studies/Results: Dg Ercp  02/02/2014   CLINICAL DATA:  Choledocholithiasis  and cholangitis.  EXAM: ERCP  TECHNIQUE: Multiple spot images obtained with the fluoroscopic device and submitted for interpretation post-procedure.  COMPARISON:  CT on 02/01/2014.  FINDINGS: Images obtained with a C-arm during the ERCP procedure demonstrate cannulation of the common bile duct. Cholangiogram shows a tortuous and massively dilated common bile duct as well as intrahepatic biliary ductal dilatation. The opacified common bile duct shows irregular filling defects. A balloon sweep maneuver was performed.  IMPRESSION: Choledocholithiasis with massive common bile duct dilatation. A balloon sweep maneuver was performed.  These images were submitted for radiologic interpretation only. Please see the procedural report for the amount of contrast and the fluoroscopy time utilized.   Electronically Signed   By: Aletta Edouard M.D.   On: 02/02/2014 17:01    Medications: I have reviewed the patient's current medications.  LOS: 3 days   Marlean Mortell 02/04/2014, 12:00 PM

## 2014-02-05 ENCOUNTER — Encounter (HOSPITAL_COMMUNITY)
Admission: EM | Disposition: A | Discharge status: Skilled Nursing Facility | Payer: Self-pay | Source: Home / Self Care | Attending: Family Medicine

## 2014-02-05 DIAGNOSIS — R339 Retention of urine, unspecified: Secondary | ICD-10-CM

## 2014-02-05 HISTORY — PX: ESOPHAGOGASTRODUODENOSCOPY: SHX5428

## 2014-02-05 LAB — GLUCOSE, CAPILLARY
GLUCOSE-CAPILLARY: 96 mg/dL (ref 70–99)
Glucose-Capillary: 122 mg/dL — ABNORMAL HIGH (ref 70–99)
Glucose-Capillary: 123 mg/dL — ABNORMAL HIGH (ref 70–99)
Glucose-Capillary: 144 mg/dL — ABNORMAL HIGH (ref 70–99)
Glucose-Capillary: 95 mg/dL (ref 70–99)

## 2014-02-05 LAB — URINALYSIS, ROUTINE W REFLEX MICROSCOPIC
Bilirubin Urine: NEGATIVE
Glucose, UA: NEGATIVE mg/dL
HGB URINE DIPSTICK: NEGATIVE
Ketones, ur: NEGATIVE mg/dL
LEUKOCYTES UA: NEGATIVE
Nitrite: NEGATIVE
PROTEIN: NEGATIVE mg/dL
Specific Gravity, Urine: 1.01 (ref 1.005–1.030)
Urobilinogen, UA: 0.2 mg/dL (ref 0.0–1.0)
pH: 8 (ref 5.0–8.0)

## 2014-02-05 SURGERY — EGD (ESOPHAGOGASTRODUODENOSCOPY)
Anesthesia: Moderate Sedation

## 2014-02-05 MED ORDER — FENTANYL CITRATE 0.05 MG/ML IJ SOLN
INTRAMUSCULAR | Status: DC | PRN
  Administered 2014-02-05: 25 ug via INTRAVENOUS

## 2014-02-05 MED ORDER — LIDOCAINE VISCOUS 2 % MT SOLN
OROMUCOSAL | Status: DC | PRN
  Administered 2014-02-05: 3 mL via OROMUCOSAL

## 2014-02-05 MED ORDER — GLUCAGON HCL RDNA (DIAGNOSTIC) 1 MG IJ SOLR
INTRAMUSCULAR | Status: AC
  Filled 2014-02-05: qty 1

## 2014-02-05 MED ORDER — MIDAZOLAM HCL 5 MG/5ML IJ SOLN
INTRAMUSCULAR | Status: AC
  Filled 2014-02-05: qty 5

## 2014-02-05 MED ORDER — PANTOPRAZOLE SODIUM 40 MG PO TBEC
40.0000 mg | DELAYED_RELEASE_TABLET | Freq: Two times a day (BID) | ORAL | Status: DC
  Administered 2014-02-05 – 2014-02-09 (×8): 40 mg via ORAL
  Filled 2014-02-05 (×8): qty 1

## 2014-02-05 MED ORDER — GLUCAGON HCL RDNA (DIAGNOSTIC) 1 MG IJ SOLR
INTRAMUSCULAR | Status: DC | PRN
  Administered 2014-02-05: .5 mg via INTRAVENOUS

## 2014-02-05 MED ORDER — SODIUM CHLORIDE 0.9 % IV SOLN: INTRAVENOUS | Status: DC

## 2014-02-05 MED ORDER — STERILE WATER FOR IRRIGATION IR SOLN
Status: DC | PRN
  Administered 2014-02-05: 12:00:00

## 2014-02-05 MED ORDER — MIDAZOLAM HCL 5 MG/5ML IJ SOLN
INTRAMUSCULAR | Status: DC | PRN
  Administered 2014-02-05: 2 mg via INTRAVENOUS
  Administered 2014-02-05: 1 mg via INTRAVENOUS

## 2014-02-05 MED ORDER — ONDANSETRON HCL 4 MG/2ML IJ SOLN
4.0000 mg | Freq: Three times a day (TID) | INTRAMUSCULAR | Status: DC
  Administered 2014-02-05 – 2014-02-06 (×2): 4 mg via INTRAVENOUS
  Filled 2014-02-05 (×2): qty 2

## 2014-02-05 MED ORDER — FENTANYL CITRATE 0.05 MG/ML IJ SOLN
INTRAMUSCULAR | Status: AC
  Filled 2014-02-05: qty 2

## 2014-02-05 MED ORDER — LIDOCAINE VISCOUS 2 % MT SOLN
OROMUCOSAL | Status: AC
Start: 2014-02-05 — End: 2014-02-06
  Filled 2014-02-05: qty 15

## 2014-02-05 MED ORDER — SODIUM CHLORIDE 0.9 % IV SOLN
INTRAVENOUS | Status: DC
  Administered 2014-02-05: 18:00:00 via INTRAVENOUS

## 2014-02-05 NOTE — H&P (View-Only) (Signed)
  Assessment/Plan: ADMITTED WITH CBD STONES, SP/P ERCP/Sx/sTONE EXTRACTION/STENT PLACEMENT, NOW WTH C/O WORSENING ABDOMINAL PAIN.  PLAN: 1. CT ABD/PELVIS W/ IVC AND NO PO CONTRAST TO EVALUATE FOR PERFORATION, DISCUSSED WITH DR.LAWRENCE. 2. HYDRATE OVERNIGHT 3. CONTINUE ABX 4. DYSPHAGIA 2 DIET  ADDNENDUM: 4081 CALLED BY DR. Purcell Nails STENT SITTING IN A DIVERTICULUM. PLAN EGD TO RE-POSITION STENT SEP 20.  Subjective: Since I last evaluated the patient [place]. TOLERATING POs  Objective: Vital signs in last 24 hours: Temp:  [97.7 F (36.5 C)-99.2 F (37.3 C)] 97.7 F (36.5 C) (09/19 0450) Pulse Rate:  [80-90] 80 (09/19 0450) Resp:  [13-20] 13 (09/19 0450) BP: (125-167)/(53-68) 167/68 mmHg (09/19 0450) SpO2:  [94 %-95 %] 95 % (09/19 0450) Weight:  [106 lb 9.6 oz (48.353 kg)] 106 lb 9.6 oz (48.353 kg) (09/19 0450) Last BM Date: 02/03/14  Intake/Output from previous day: 09/18 0701 - 09/19 0700 In: 470 [P.O.:420; IV Piggyback:50] Out: 2 [Urine:1; Stool:1] Intake/Output this shift: Total I/O In: 243 [P.O.:240; I.V.:3] Out: -   General appearance: alert, cooperative and mild distress Resp: clear to auscultation bilaterally Cardio: regular rate and rhythm GI: soft, MILDLY tender INEPIGASTRIUM; bowel sounds normal; MILD DISTENTION,NO REBOUND OR GUARDING  Lab Results:  Recent Labs  02/01/14 1630 02/02/14 0416 02/04/14 0615  WBC 14.0* 14.9* 12.3*  HGB 13.4 11.7* 12.4  HCT 39.5 33.9* 36.3  PLT PLATELET CLUMPS NOTED ON SMEAR, COUNT APPEARS ADEQUATE 188 177   BMET  Recent Labs  02/01/14 1630 02/02/14 0416  NA 141 142  K 4.1 3.4*  CL 96 100  CO2 29 29  GLUCOSE 168* 94  BUN 26* 20  CREATININE 1.02 0.99  CALCIUM 9.6 8.7   LFT  Recent Labs  02/04/14 0615  PROT 6.1  ALBUMIN 2.4*  AST 59*  ALT 100*  ALKPHOS 328*  BILITOT 0.7  BILIDIR 0.2  IBILI 0.5    Studies/Results: Dg Ercp  02/02/2014   CLINICAL DATA:  Choledocholithiasis  and cholangitis.  EXAM: ERCP  TECHNIQUE: Multiple spot images obtained with the fluoroscopic device and submitted for interpretation post-procedure.  COMPARISON:  CT on 02/01/2014.  FINDINGS: Images obtained with a C-arm during the ERCP procedure demonstrate cannulation of the common bile duct. Cholangiogram shows a tortuous and massively dilated common bile duct as well as intrahepatic biliary ductal dilatation. The opacified common bile duct shows irregular filling defects. A balloon sweep maneuver was performed.  IMPRESSION: Choledocholithiasis with massive common bile duct dilatation. A balloon sweep maneuver was performed.  These images were submitted for radiologic interpretation only. Please see the procedural report for the amount of contrast and the fluoroscopy time utilized.   Electronically Signed   By: Aletta Edouard M.D.   On: 02/02/2014 17:01    Medications: I have reviewed the patient's current medications.  LOS: 3 days   Sandi Fields 02/04/2014, 12:00 PM

## 2014-02-05 NOTE — Progress Notes (Signed)
PROGRESS NOTE  [name] NIO:[id number] DOB: 03-Oct-1912 DOA: 02/01/2014 PCP: [name], MD  Summary: 80 year old woman sent to the emergency department for abdominal pain, low-grade fever. Evaluation revealed dilatation of the hepatic ducts and distal common bile duct stone. Patient admitted for cholangitis, choledocholithiasis, GI consultation.  Assessment/Plan: 1. Choledocholithiasis, suspected acute cholangitis, status post ERCP with biliary sphincterotomy, stone extraction and biliary stent placement. Imaging reviewed stent migration. EGD unable to relocate.  2. Abdominal pain likely secondary to urinary retention 3. Urinary retention. Foley placed 9/20. 4. Diabetes mellitus type 2. Stable.  5. History of COPD. Stable.   Continue Augmentin.   per Dr. Oneida Alar may need repeat ERCP in AM  Continue foley for now  Murray Hodgkins, MD  [facility]  Pager [phone] If 7PM-7AM, please contact night-coverage at [email or web address], password [facility] 02/05/2014, 4:10 PM  LOS: 4 days   Consultants:  Gastroenterology  Procedures:  9/17 ERCP IMPRESSION: Juxtampullary diverticula, massively dilated bile duct. Choledocholithiasis. Status post biliary sphincterotomy. Balloon and basket stone extraction and stent placement. Attempted cholangioscopy as described above.  9/20 EGD FINDINGS:  1. SCOPE TRAUMA IN MID-ESOPHAGUS AND CARDIA  2. PATENT SCHATZKI'S RING  3. MODERATE GASTRITIS  4. STENT EMBEDDED IN DIVERTICULUM WITH ULCERATED/ERYTHEMATOUS BASE. UNABLE TO RE-POSITION STENT.  Antibiotics:  Unasyn 9/16 >> 9/19  Augmentin 9/19 >>  HPI/Subjective: Urinary retention noted, difficult catheterization therefore foley placed. EGD unable to reposition stent, likely needs repeat ERCP.  Complains had no breakfast.  Objective: Filed Vitals:   02/05/14 1240 02/05/14 1245 02/05/14 1250 02/05/14 1400  BP: 140/82 152/88  177/73  Pulse: 108 106 98 89  Temp:    97.7 F  (36.5 C)  TempSrc:    Oral  Resp: 25 23 21 17   Height:      Weight:      SpO2: 100% 100% 100% 100%    Intake/Output Summary (Last 24 hours) at 02/05/14 1610 Last data filed at 02/05/14 0900  Gross per 24 hour  Intake      0 ml  Output      1 ml  Net     -1 ml     Filed Weights   02/01/14 2319 02/04/14 0450  Weight: 42 kg (92 lb 9.5 oz) 48.353 kg (106 lb 9.6 oz)    Exam:     Afebrile, VSS  Appears calm, comfortable, speech fluent and clear  CV RRR no m/r/g.   Resp CTA bilaterally no w/r/r. Normal resp effort  Abd soft, ndnd  Data Reviewed:   CBG stable  Urinalysis negative  CT ab/pelvis: no evidence of bowel perforation, endoscopic stent has migrated, distended urinary bladder  Scheduled Meds: . amoxicillin-clavulanate  1 tablet Oral BID  . calcium-vitamin D  1 tablet Oral Q breakfast  . darifenacin  7.5 mg Oral Daily  . docusate sodium  100 mg Oral BID  . fentaNYL      . fentaNYL      . glucagon (human recombinant)      . insulin aspart  0-9 Units Subcutaneous Q6H  . levothyroxine  25 mcg Oral QAC breakfast  . lidocaine      . midazolam      . montelukast  10 mg Oral q morning - 10a  . ondansetron (ZOFRAN) IV  4 mg Intravenous TID AC  . pantoprazole  40 mg Oral BID AC  . sodium chloride  3 mL Intravenous Q12H   Continuous Infusions: . sodium chloride  Principal Problem:   Cholangitis Active Problems:   DIABETES MELLITUS, TYPE II   HYPERTENSION   MGUS (monoclonal gammopathy of unknown significance)   Cholelithiasis with biliary obstruction   Biliary obstruction   Time: 20 minutes

## 2014-02-05 NOTE — Procedures (Signed)
FINDINGS:  1. SCOPE TRAUMA IN MID-ESOPHAGUS AND CARDIA 2. PATENT SCHATZKI'S RING 3. MODERATE GASTRITIS 4. STENT EMBEDDED IN DIVERTICULUM WITH ULCERATED/ERYTHEMATOUS BASE. UNABLE TO RE-POSITION STENT.  DIAGNOSIS: WORSENING ABDOMINAL PAIN/VOMITING AFTER ERCP/STENT PLACEMENT. ETIOLOGY UNCLEAR BUT MAY BE DUE TO URINARY RETENTION SEEN ON CT, LESS LIKELY DUODENAL DIVERTICULITIS, OR UTI  PLAN: 1. NPO AFTER MN EXCEPT MEDS. PT MAY NEED A REPEAT ERCP TO REMOVE STENT AND REPOSITION 2. BID PPI 3. AWAIT BIOPSY 4. ADVANCE DIET 5. CONTINUE AUGMENTIN 6. ZOFRAN QAC  FULL REPORT TO FOLLOW

## 2014-02-05 NOTE — Interval H&P Note (Signed)
History and Physical Interval Note:  02/05/2014 11:59 AM  [name]  has presented today for surgery, with the diagnosis of ABDOMINAL PAIN, STENT IN A DIVERTICULUM  The various methods of treatment have been discussed with the patient and family. After consideration of risks, benefits and other options for treatment, the patient has consented to  Procedure(s): ESOPHAGOGASTRODUODENOSCOPY (EGD) (N/A) as a surgical intervention .  The patient's history has been reviewed, patient examined, no change in status, stable for surgery.  I have reviewed the patient's chart and labs.  Questions were answered to the patient's satisfaction.     [employer]

## 2014-02-06 ENCOUNTER — Encounter (HOSPITAL_COMMUNITY): Payer: Self-pay | Admitting: Anesthesiology

## 2014-02-06 ENCOUNTER — Other Ambulatory Visit (HOSPITAL_COMMUNITY): Payer: PRIVATE HEALTH INSURANCE

## 2014-02-06 ENCOUNTER — Inpatient Hospital Stay (HOSPITAL_COMMUNITY): Payer: PRIVATE HEALTH INSURANCE

## 2014-02-06 ENCOUNTER — Encounter (HOSPITAL_COMMUNITY): Payer: Self-pay | Admitting: Gastroenterology

## 2014-02-06 DIAGNOSIS — E43 Unspecified severe protein-calorie malnutrition: Secondary | ICD-10-CM | POA: Insufficient documentation

## 2014-02-06 DIAGNOSIS — G9340 Encephalopathy, unspecified: Secondary | ICD-10-CM

## 2014-02-06 LAB — GLUCOSE, CAPILLARY
GLUCOSE-CAPILLARY: 103 mg/dL — AB (ref 70–99)
GLUCOSE-CAPILLARY: 78 mg/dL (ref 70–99)
GLUCOSE-CAPILLARY: 113 mg/dL — AB (ref 70–99)
GLUCOSE-CAPILLARY: 69 mg/dL — AB (ref 70–99)
GLUCOSE-CAPILLARY: 86 mg/dL (ref 70–99)
Glucose-Capillary: 135 mg/dL — ABNORMAL HIGH (ref 70–99)
Glucose-Capillary: 145 mg/dL — ABNORMAL HIGH (ref 70–99)

## 2014-02-06 LAB — APTT: aPTT: 177 seconds — ABNORMAL HIGH (ref 24–37)

## 2014-02-06 LAB — CBC WITH DIFFERENTIAL/PLATELET
BASOS PCT: 0 % (ref 0–1)
Basophils Absolute: 0 10*3/uL (ref 0.0–0.1)
Eosinophils Absolute: 0.2 10*3/uL (ref 0.0–0.7)
Eosinophils Relative: 2 % (ref 0–5)
HCT: 34.9 % — ABNORMAL LOW (ref 36.0–46.0)
Hemoglobin: 12 g/dL (ref 12.0–15.0)
Lymphocytes Relative: 12 % (ref 12–46)
Lymphs Abs: 1.2 10*3/uL (ref 0.7–4.0)
MCH: 32.4 pg (ref 26.0–34.0)
MCHC: 34.4 g/dL (ref 30.0–36.0)
MCV: 94.3 fL (ref 78.0–100.0)
MONOS PCT: 11 % (ref 3–12)
Monocytes Absolute: 1.1 10*3/uL — ABNORMAL HIGH (ref 0.1–1.0)
NEUTROS PCT: 75 % (ref 43–77)
Neutro Abs: 7.5 10*3/uL (ref 1.7–7.7)
PLATELETS: 199 10*3/uL (ref 150–400)
RBC: 3.7 MIL/uL — AB (ref 3.87–5.11)
RDW: 13.7 % (ref 11.5–15.5)
WBC: 10 10*3/uL (ref 4.0–10.5)

## 2014-02-06 LAB — CULTURE, BLOOD (ROUTINE X 2)
Culture: NO GROWTH
Culture: NO GROWTH

## 2014-02-06 LAB — BLOOD GAS, ARTERIAL
Acid-Base Excess: 2 mmol/L (ref 0.0–2.0)
Bicarbonate: 26 mEq/L — ABNORMAL HIGH (ref 20.0–24.0)
Drawn by: 635
FIO2: 0.21 %
O2 CONTENT: 21 L/min
O2 SAT: 90.3 %
PATIENT TEMPERATURE: 37
TCO2: 23.5 mmol/L (ref 0–100)
pCO2 arterial: 40.9 mmHg (ref 35.0–45.0)
pH, Arterial: 7.42 (ref 7.350–7.450)
pO2, Arterial: 67.6 mmHg — ABNORMAL LOW (ref 80.0–100.0)

## 2014-02-06 LAB — COMPREHENSIVE METABOLIC PANEL
ALK PHOS: 222 U/L — AB (ref 39–117)
ALT: 45 U/L — ABNORMAL HIGH (ref 0–35)
ANION GAP: 10 (ref 5–15)
AST: 19 U/L (ref 0–37)
Albumin: 2.5 g/dL — ABNORMAL LOW (ref 3.5–5.2)
BUN: 19 mg/dL (ref 6–23)
CALCIUM: 9 mg/dL (ref 8.4–10.5)
CO2: 27 mEq/L (ref 19–32)
Chloride: 101 mEq/L (ref 96–112)
Creatinine, Ser: 0.97 mg/dL (ref 0.50–1.10)
GFR calc non Af Amer: 47 mL/min — ABNORMAL LOW (ref >90)
GFR, EST AFRICAN AMERICAN: 55 mL/min — AB (ref >90)
GLUCOSE: 84 mg/dL (ref 70–99)
Potassium: 3.8 mEq/L (ref 3.7–5.3)
Sodium: 138 mEq/L (ref 137–147)
TOTAL PROTEIN: 5.9 g/dL — AB (ref 6.0–8.3)
Total Bilirubin: 0.6 mg/dL (ref 0.3–1.2)

## 2014-02-06 LAB — PROTIME-INR
INR: 2.38 — AB (ref 0.00–1.49)
Prothrombin Time: 26 seconds — ABNORMAL HIGH (ref 11.6–15.2)

## 2014-02-06 LAB — TROPONIN I: Troponin I: <0.3 ng/mL (ref <0.30)

## 2014-02-06 LAB — LACTIC ACID, PLASMA: LACTIC ACID, VENOUS: 0.8 mmol/L (ref 0.5–2.2)

## 2014-02-06 MED ORDER — ASPIRIN 325 MG PO TABS
325.0000 mg | ORAL_TABLET | Freq: Every day | ORAL | Status: DC
  Administered 2014-02-06 – 2014-02-09 (×4): 325 mg via ORAL
  Filled 2014-02-06 (×4): qty 1

## 2014-02-06 MED ORDER — LORAZEPAM 2 MG/ML IJ SOLN
1.0000 mg | Freq: Once | INTRAMUSCULAR | Status: AC
  Administered 2014-02-06: 1 mg via INTRAVENOUS
  Filled 2014-02-06: qty 1

## 2014-02-06 MED ORDER — ENSURE COMPLETE PO LIQD
237.0000 mL | Freq: Three times a day (TID) | ORAL | Status: DC
  Administered 2014-02-06 – 2014-02-09 (×10): 237 mL via ORAL

## 2014-02-06 MED ORDER — DEXTROSE 50 % IV SOLN
25.0000 mL | Freq: Once | INTRAVENOUS | Status: AC | PRN
  Administered 2014-02-06: 25 mL via INTRAVENOUS

## 2014-02-06 MED ORDER — ENSURE COMPLETE PO LIQD: 237.0000 mL | Freq: Two times a day (BID) | ORAL | Status: DC

## 2014-02-06 MED ORDER — DEXTROSE 50 % IV SOLN
INTRAVENOUS | Status: AC
  Filled 2014-02-06: qty 50

## 2014-02-06 MED ORDER — STROKE: EARLY STAGES OF RECOVERY BOOK
Freq: Once | Status: AC
  Administered 2014-02-07: 1
  Filled 2014-02-06: qty 1

## 2014-02-06 MED ORDER — ASPIRIN 300 MG RE SUPP
300.0000 mg | Freq: Every day | RECTAL | Status: DC
  Filled 2014-02-06 (×5): qty 1

## 2014-02-06 MED ORDER — DEXTROSE-NACL 5-0.9 % IV SOLN
INTRAVENOUS | Status: DC
  Administered 2014-02-06: 18:00:00 via INTRAVENOUS

## 2014-02-06 NOTE — Progress Notes (Signed)
NUTRITION FOLLOW UP  Pt meets criteria for severe MALNUTRITION in the context of chronic illness as evidenced by <7%% of estimated energy intake x 1 month, severe fat and muscle depletion.  Intervention:   Ensure Complete po BID, each supplement provides 350 kcal and 13 grams of protein  Nutrition Dx:   Inadequate oral intake related to altered GI function as evidenced by poor PO intake (0-5%).   Goal:   Pt will meet >90% of estimated nutritional needs  Monitor:   PO/supplement intake, labs, weight changes, I/O's  Assessment:   80 year old woman sent to the emergency department for abdominal pain, low-grade fever. Evaluation revealed dilatation of the hepatic ducts and distal common bile duct stone. Patient admitted for cholangitis, choledocholithiasis, GI consultation.  Pt s/p ERCP on 02/02/14. Pt experienced acute mental changes overnight- MRI of brain and EEG scheduled for today. Noted repeat ERCP being done on 02/07/14 due to stents being found in duodenum.  Pt with abdominal pain. She confirms poor appetite and weight loss. Reports UBW around 108#, but unable to quantify time frame for weight loss. She reports she drinks Ensure "all the time" and is agreeable to RD ordering.  Noted breakfast tray unattempted at bedside. Pt reports that she doesn't have her teeth with her and is having difficult chewing her foods. She refused offer of diet downgrade and asked this RD "Do I really have to eat here?". This RD explained importance of good PO intake to promote healing process. Encouraged pt to eat as much off trays as she is able and to drink supplements as agreed to help improve nutritional status.   Noted a 14# (15.2%) wt gain x 5 days, likely due to fluid status.  Labs improving; now WDL  Nutrition Focused Physical Exam:  Subcutaneous Fat:  Orbital Region: mild depletion Upper Arm Region: severe depletion Thoracic and Lumbar Region: severe depletion  Muscle:  Temple Region: mild  depletion Clavicle Bone Region: severe depletion Clavicle and Acromion Bone Region: severe depletion Scapular Bone Region: severe depletion Dorsal Hand: moderate depletion Patellar Region: moderate depletion Anterior Thigh Region: moderate depletion Posterior Calf Region: moderate depletion  Edema: none present  Height: Ht Readings from Last 1 Encounters:  02/01/14 5\' 2"  (1.575 m)    Weight Status:   Wt Readings from Last 1 Encounters:  02/04/14 106 lb 9.6 oz (48.353 kg)  02/01/14  92 lb 9.5 oz (42 kg)  Re-estimated needs:  Kcal: 1517-6160 Protein: 58-68 grams Fluid: 1.5-1.7 L  Skin: Intact  Diet Order: Dysphagia 3   Intake/Output Summary (Last 24 hours) at 02/06/14 0933 Last data filed at 02/06/14 0551  Gross per 24 hour  Intake     40 ml  Output   1475 ml  Net  -1435 ml    Last BM: 02/04/14   Labs:   Recent Labs Lab 02/01/14 1630 02/02/14 0416 02/06/14 0720  NA 141 142 138  K 4.1 3.4* 3.8  CL 96 100 101  CO2 29 29 27   BUN 26* 20 19  CREATININE 1.02 0.99 0.97  CALCIUM 9.6 8.7 9.0  GLUCOSE 168* 94 84    CBG (last 3)   Recent Labs  02/05/14 1812 02/05/14 2340 02/06/14 0639  GLUCAP 95 96 103*    Scheduled Meds: . amoxicillin-clavulanate  1 tablet Oral BID  . calcium-vitamin D  1 tablet Oral Q breakfast  . darifenacin  7.5 mg Oral Daily  . docusate sodium  100 mg Oral BID  . insulin aspart  0-9 Units Subcutaneous Q6H  . levothyroxine  25 mcg Oral QAC breakfast  . montelukast  10 mg Oral q morning - 10a  . ondansetron (ZOFRAN) IV  4 mg Intravenous TID AC  . pantoprazole  40 mg Oral BID AC  . sodium chloride  3 mL Intravenous Q12H    Continuous Infusions:   Sharisa Toves A. Jimmye Norman, RD, LDN Pager: [phone]

## 2014-02-06 NOTE — Discharge Instructions (Signed)
Nutrition Post Hospital Stay Proper nutrition can help your body recover from illness and injury.   Foods and beverages high in protein, vitamins, and minerals help rebuild muscle loss, promote healing, & reduce fall risk.   .In addition to eating healthy foods, a nutrition shake is an easy, delicious way to get the nutrition you need during and after your hospital stay  It is recommended that you continue to drink 2 bottles per day of:       Ensure for at least 1 month (30 days) after your hospital stay   Tips for adding a nutrition shake into your routine: As allowed, drink one with vitamins or medications instead of water or juice Enjoy one as a tasty mid-morning or afternoon snack Drink cold or make a milkshake out of it Drink one instead of milk with cereal or snacks Use as a coffee creamer   Available at the following grocery stores and pharmacies:           * [employer] * [employer] * [employer]  * R[employer] Aid          * Walmart * [employer]  * Wal[facility]reens      * Target  * BJ's   * [facility]  * [employer]   * [facility] [phone]            For COUPONS visit: www.ensure.com/join or www.[email or web address]/members/sign-[email or web address]   Suggested Substitutions Ensure Plus = Boost Plus = Carnation Breakfast Essentials = Boost Compact Ensure Active Clear = Boost Breeze Glucerna Shake = Boost Glucose Control = Carnation Breakfast Essentials SUGAR FREE       

## 2014-02-06 NOTE — Evaluation (Signed)
Clinical/Bedside Swallow Evaluation Patient Details  Name: [name] MRN: [id number] Date of Birth: [birth year]  Today's Date: 02/06/2014 Time: 7829-5621 SLP Time Calculation (min): 23 min  Past Medical History:  Past Medical History  Diagnosis Date  . Urinary retention   . Gastritis   . Esophageal ulcer   . Salmonella sepsis   . Degenerative disk disease   . Spinal stenosis   . Asthma   . COPD (chronic obstructive pulmonary disease)   . Diabetes mellitus, type 2   . Diverticulosis of colon   . GERD (gastroesophageal reflux disease)   . Hypertension   . Osteoarthritis   . Hiatal hernia   . Pancreatitis   . S/P endoscopy 2009    Dr. Oneida Alar: gastritis  . Diverticulitis   . S/P endoscopy March 2012    Dr. Gala Romney: NSAID induced ulcer  . Hypothyroidism   . MGUS (monoclonal gammopathy of unknown significance)    Past Surgical History:  Past Surgical History  Procedure Laterality Date  . Cervical fusion    . Cholecystectomy    . Ercp N/A 02/02/2014    Procedure: ENDOSCOPIC RETROGRADE CHOLANGIOPANCREATOGRAPHY (ERCP), STONE BASKET EXTRACTION;  Surgeon: Daneil Dolin, MD;  Location: [facility];  Service: Endoscopy;  Laterality: N/A;  . Sphincterotomy N/A 02/02/2014    Procedure: SPHINCTEROTOMY;  Surgeon: Daneil Dolin, MD;  Location: [facility];  Service: Endoscopy;  Laterality: N/A;  . Balloon dilation N/A 02/02/2014    Procedure: BALLOON DILATION;  Surgeon: Daneil Dolin, MD;  Location: [facility];  Service: Endoscopy;  Laterality: N/A;  . Biliary stent placement N/A 02/02/2014    Procedure: BILIARY STENT PLACEMENT;  Surgeon: Daneil Dolin, MD;  Location: [facility];  Service: Endoscopy;  Laterality: N/A;  . Spyglass cholangioscopy N/A 02/02/2014    Procedure: HYQMVHQI CHOLANGIOSCOPY;  Surgeon: Daneil Dolin, MD;  Location: [facility];  Service: Endoscopy;  Laterality: N/A;  . Esophagogastroduodenoscopy N/A 02/05/2014    Procedure: ESOPHAGOGASTRODUODENOSCOPY (EGD);  Surgeon: [name], MD;  Location: [facility];  Service: Endoscopy;  Laterality: N/A;   HPI:  80 year old woman sent to the emergency department for abdominal pain, low-grade fever. Evaluation revealed dilatation of the hepatic ducts and distal common bile duct stone. Patient admitted for cholangitis, choledocholithiasis, GI consultation. Pt with mental status changes over night; CVA confirmed on MRI.    Assessment / Plan / Recommendation Clinical Impression  Pt presented initially with audible congestion with cued cough. Suspect pt has been pooling saliva, not swallowing as frequently so she is expectorating. Oral motor exam is unremarkable for asymmetry or unilateral weakness. Swallow initiation slightly delayed, but improved with continued presentations. Recommend D2 and thin liquids with SLP to follow for diet tolerance and upgrades.    Aspiration Risk  Mild    Diet Recommendation Dysphagia 2 (Fine chop);Thin liquid   Liquid Administration via: Cup;Straw Medication Administration: Whole meds with liquid Supervision: Staff to assist with self feeding;Full supervision/cueing for compensatory strategies Compensations: Slow rate;Small sips/bites Postural Changes and/or Swallow Maneuvers: Out of bed for meals;Seated upright 90 degrees;Upright 30-60 min after meal    Other  Recommendations Oral Care Recommendations: Oral care BID Other Recommendations: Clarify dietary restrictions   Follow Up Recommendations  24 hour supervision/assistance    Frequency and Duration min 2x/week  2 weeks   Pertinent Vitals/Pain vss    SLP Swallow Goals   Pt will demonstrate safe and efficient consumption of least restrictive diet with use of strategies as needed.  Swallow Study Prior Functional Status       General Date of Onset: 02/06/14 HPI: 80 year old woman sent to the emergency department for abdominal pain, low-grade fever. Evaluation revealed dilatation of the hepatic ducts and distal common bile  duct stone. Patient admitted for cholangitis, choledocholithiasis, GI consultation. Pt with mental status changes over night; CVA confirmed on MRI.  Type of Study: Bedside swallow evaluation Diet Prior to this Study: NPO Temperature Spikes Noted: No Respiratory Status: Room air History of Recent Intubation: No Behavior/Cognition: Alert;Cooperative;Pleasant mood;Requires cueing Oral Cavity - Dentition: Dentures, not available;Edentulous Self-Feeding Abilities: Needs assist Patient Positioning: Upright in bed Baseline Vocal Quality: Clear;Hoarse Volitional Cough: Strong;Congested Volitional Swallow: Able to elicit    Oral/Motor/Sensory Function Overall Oral Motor/Sensory Function: Appears within functional limits for tasks assessed   Ice Chips Ice chips: Impaired Presentation: Spoon Oral Phase Impairments: Reduced lingual movement/coordination Oral Phase Functional Implications: Oral holding Pharyngeal Phase Impairments: Suspected delayed Swallow;Cough - Immediate   Thin Liquid Thin Liquid: Within functional limits Presentation: Cup;Straw    Nectar Thick Nectar Thick Liquid: Not tested   Honey Thick Honey Thick Liquid: Not tested   Puree Puree: Within functional limits Presentation: Spoon   Solid   Thank you,  Genene Churn, CCC-SLP [phone]     Solid: Impaired Presentation: Spoon Oral Phase Impairments: Reduced lingual movement/coordination;Impaired mastication       PORTER,DABNEY 02/06/2014,4:56 PM

## 2014-02-06 NOTE — Clinical Social Work Note (Signed)
CSW updated Butch Penny at [facility] on pt. Left voicemail for guardian at [facility] as well. Pt to have ERCP tomorrow. CSW will continue to follow.  [name], [place]

## 2014-02-06 NOTE — Evaluation (Signed)
Called by RN pt unresponsive last seen normal at ??4am, pt was verbal and having a routine night, no pain.  Now she is just staring into space, not following any commands, flaccid throughout, no twitching/sz like activity obvious, nonlateralizing exam, unresponsive but with normal vitals, normal breathing pattern, 95% on RA.    Cards rrr, lungs are clear.  abd soft nd pos bs.  No c/c/e.  But per admitting md note, at that time she would not respond to him either than seemed to normalize.  Pt has had 2 ercp in the last several days with stent placement for cholangitis.  Ward of the state.  Have called dr Leonel Ramsay to discuss case, due to her lack of lateralization on physical exam along with her recent procedures and advanced age did not feel tpa candidate.  Seizure???  Post ictal??   Ordered stat ct head, 12 lead ekg, cmp, cbc, lactic acid,  Cxr, abg, trop.  Also will try ativan 1mg  x 1 to see if she responds to this any, also ordered eeg for this am.  Ck stat glucose.

## 2014-02-06 NOTE — Progress Notes (Addendum)
REVIEWED. PT WITH RECENT SPHINCTEROTOMY. NOW ON ASA DUE TO STROKE PROTOCOL/MS CHANGES. D/C ZOFRAN AND USE PRN. MONITOR FOR GI BLEED. PLAN FOR ERCP SEP 22 IF PT STABLE. WILL RE-ASSESS IN AM.

## 2014-02-06 NOTE — Progress Notes (Signed)
Subjective: Abdominal pain better. Confused. Acute mental status changes overnight with work-up thus far unrevealing. MRI brain and EEG scheduled for today. ERCP postponed until 9/22.   Objective: Vital signs in last 24 hours: Temp:  [97.7 F (36.5 C)-98.8 F (37.1 C)] 98.3 F (36.8 C) (09/21 0552) Pulse Rate:  [85-121] 85 (09/21 0552) Resp:  [15-28] 20 (09/20 2240) BP: (123-214)/(62-116) 123/62 mmHg (09/21 0552) SpO2:  [94 %-100 %] 95 % (09/21 0552) Last BM Date: 02/04/14 General:   Alert and oriented to person only Head:  Normocephalic and atraumatic. Abdomen:  Bowel sounds present, soft, mild discomfort to palpation lower abdomen, non-distended. Psych:  Alert and cooperative.   Intake/Output from previous day: 09/20 0701 - 09/21 0700 In: 40 [P.O.:40] Out: 1475 [Urine:1475] Intake/Output this shift:    Lab Results:  Recent Labs  02/04/14 0615 02/06/14 0720  WBC 12.3* 10.0  HGB 12.4 12.0  HCT 36.3 34.9*  PLT 177 199   BMET  Recent Labs  02/06/14 0720  NA 138  K 3.8  CL 101  CO2 27  GLUCOSE 84  BUN 19  CREATININE 0.97  CALCIUM 9.0   LFT  Recent Labs  02/04/14 0615 02/06/14 0720  PROT 6.1 5.9*  ALBUMIN 2.4* 2.5*  AST 59* 19  ALT 100* 45*  ALKPHOS 328* 222*  BILITOT 0.7 0.6  BILIDIR 0.2  --   IBILI 0.5  --      Studies/Results: Ct Abdomen Wo Contrast  02/06/2014   CLINICAL DATA:  Altered mental status. 80 year old with abdominal pain and low-grade fever. Admitted for cholangitis and choledocholithiasis.  EXAM: CT ABDOMEN WITHOUT CONTRAST  TECHNIQUE: Multidetector CT imaging of the abdomen was performed following the standard protocol without IV contrast.  COMPARISON:  02/04/2014  FINDINGS: There are stable bilateral small pleural effusions, left side greater than right. No evidence for free air.  Again noted is diffuse pneumobilia consistent with prior biliary intervention. There continues to be a non metallic biliary stent in the distal  common bile duct that extends into the duodenum. Adjacent to the proximal aspect of the stent, there appears to be a calcified stone on sequence 2, image 23. This 1 cm stone is best visualized on sequence 3, image 33. Gallbladder has been removed. No acute abnormality to the liver. Spleen is small and unchanged. No acute abnormality involving the adrenal glands or pancreas. There are low-density structures in both kidneys which are difficult to evaluate on this exam but likely represent cortical cysts. Negative for hydronephrosis.  The abdominal aorta is heavily calcified without aneurysm. Calcifications along the periphery of the visualized uterus. There are multiple colonic diverticula in the left colon. No significant free fluid or lymphadenopathy within the abdomen. No acute abnormality involving the stomach. Multilevel disc and facet disease in the lumbar spine.  IMPRESSION: Stable position of the biliary stent. There appears to be a stone adjacent to the proximal aspect of the stent. Again noted is diffuse pneumobilia.  Diffuse colonic diverticulosis.  Stable bilateral pleural effusions, left side greater than right.   Electronically Signed   By: Markus Daft M.D.   On: 02/06/2014 08:09   Dg Chest 1 View  02/06/2014   CLINICAL DATA:  Altered mental status  EXAM: CHEST - 1 VIEW  COMPARISON:  02/01/2014  FINDINGS: Haziness of the lower chest which correlates with small pleural effusions and atelectasis on contemporaneous abdominal CT. Changes are more extensive on the left. No edema. Skin folds noted biapically; no pneumothorax  suspected. Normal heart size and upper mediastinal contours for age. No acute osseous findings.  IMPRESSION: Small bilateral pleural effusion with atelectasis. No edema or pneumonia.   Electronically Signed   By: Jorje Guild M.D.   On: 02/06/2014 06:32   Ct Head Wo Contrast  02/06/2014   CLINICAL DATA:  Altered mental status.  EXAM: CT HEAD WITHOUT CONTRAST  TECHNIQUE: Contiguous  axial images were obtained from the base of the skull through the vertex without intravenous contrast.  COMPARISON:  CT of the head January 22, 2008  FINDINGS: The ventricles and sulci are normal for age. No intraparenchymal hemorrhage, mass effect nor midline shift. Patchy patchy to confluent supratentorial white matter hypodensities are within normal range for patient's age and though non-specific suggest sequelae of chronic small vessel ischemic disease. No acute large vascular territory infarcts.  No abnormal extra-axial fluid collections. Basal cisterns are patent. Severe calcific atherosclerosis of the carotid siphons.  No skull fracture. Patient is osteopenic. The included ocular globes and orbital contents are non-suspicious. Status post LEFT ocular lens implant. The mastoid aircells and included paranasal sinuses are well-aerated.  IMPRESSION: No acute intracranial process.  Involutional changes. Moderate white matter changes suggest chronic small vessel ischemic disease.   Electronically Signed   By: Elon Alas   On: 02/06/2014 06:28   Ct Abdomen Pelvis W Contrast  02/04/2014   CLINICAL DATA:  Worsening abdominal pain after ERCP and sphincterotomy yesterday.  EXAM: CT ABDOMEN AND PELVIS WITH CONTRAST  TECHNIQUE: Multidetector CT imaging of the abdomen and pelvis was performed using the standard protocol following bolus administration of intravenous contrast.  CONTRAST:  157mL OMNIPAQUE IOHEXOL 300 MG/ML IV. Oral contrast was not administered.  COMPARISON:  Multiple prior CT abdomen and pelvis: 02/01/2014, 12/14/2013, dating back to 11/17/2007.  FINDINGS: Endoscopic biliary stent with its proximal in the in the common bile duct and the distal end in what I believe is a small diverticulum arising from the descending duodenum laterally, as this was a contrast filled structure on the CT 3 days ago and is now filled with air. Gas within the common bile duct, common hepatic duct in the independent  bile ducts in the liver, consistent with stent patency. Interval decrease in the caliber the common bile duct. Calcified granuloma in the anterior segment right lobe of liver at the dome; no significant focal hepatic parenchymal abnormalities. Gallbladder surgically absent.  Atrophic spleen and pancreas without focal parenchymal abnormalities involving either organ. Normal adrenal glands. Cortical cysts involving both kidneys; no significant abnormality involving either kidney. Extensive aortoiliofemoral atherosclerosis without aneurysm or dissection. Visceral arteries atherosclerotic though patent; high-grade stenosis at the origin of the celiac artery with widely patent SMA and IMA origins. No significant lymphadenopathy.  Normal-appearing stomach filled with food. Normal-appearing small bowel. Extensive diffuse colonic diverticulosis without evidence of acute diverticulitis. No ascites.  Markedly distended urinary bladder. Atrophic uterus consistent with age. No adnexal masses or free pelvic fluid.  Bone window images demonstrate osseous demineralization, degenerative disc disease and spondylosis at L3-4, L4-5 and L5-S1, facet degenerative changes at these levels, degenerative grade 1 spondylolisthesis of L3 on L4 and L4 on L5, and degenerative changes in the sacroiliac joints. Visualized lung bases demonstrate bilateral pleural effusions, left greater than right, with passive atelectasis in the lower lobes. Heart mildly enlarged with aortic valvular calcification and severe 3 vessel coronary atherosclerosis.  IMPRESSION: 1. No evidence of bowel perforation. 2. Endoscopic stent with its proximal end in the common bile duct and  its distal end in a diverticulum arising from the lateral wall of the descending duodenum. 3. Interval decrease in caliber of the common bile duct after stent placement. Gas within the common bile duct, common hepatic duct and independent intrahepatic ducts is consistent with stent patency.  4. Extensive diffuse colonic diverticulosis without evidence of acute diverticulitis. 5. Distended urinary bladder. 6. Bilateral pleural effusions, left greater than right, and associated passive atelectasis in the lower lobes. 7. Severe generalized atherosclerosis as detailed above. 8. Osseous findings as above. These results were called by telephone at the time of interpretation on 02/04/2014 at 3:02 pm to Dr. Barney Drain , who verbally acknowledged these results.   Electronically Signed   By: Evangeline Dakin M.D.   On: 02/04/2014 15:02    Assessment: 80 year old female admitted with choledocholithiasis/cholangitis s/p ERCP with sphincterotomy, stone extraction, stent placement on 9/17, with worsening abdominal pain and CT revealing stent embedded in diverticulum. Unable to reposition at time of EGD. Needs repeat ERCP with stent repositioning, which has been postponed till 9/22 due to neurological changes overnight. MRI, EEG ordered for today.   Plan: Advance diet  Continue Augmentin PPI BID Follow-up on pending path from gastric biopsy NPO after midnight ERCP with Dr. Oneida Alar 9/22   Orvil Feil, ANP-BC [facility]     LOS: 5 days    02/06/2014, 8:53 AM

## 2014-02-06 NOTE — Progress Notes (Addendum)
Patient now more alert.  Had difficulty speaking at first, but was able to move extremities and follow command. Notified DSS Algis Liming) of patient condition and patient's daughter A. Maricela Bo.  At current time, patient is back to baseline.  Will continue to monitor patient.

## 2014-02-06 NOTE — Progress Notes (Signed)
Aide went in room to do vitals and turn patient.  Patient lying in bed with head turned to side.  Patient unresponsive with fixed pupils.  Notifed MD.  MD came to floor to assess patient.  Vitals stable, heart rate stable, and patient's breathing unlabored and even.  Patient took for stat ct and chest x-ray.  Ekg and abg done.  Notified MD.  Carried out orders.

## 2014-02-06 NOTE — Consult Note (Signed)
[place] A. Merlene Laughter, MD     [email or web address]          [name] is an 80 y.o. female.   ASSESSMENT/PLAN:  1. Single episode of unresponsiveness that improves spontaneously. The semiology seems most consistent with a unwitnessed seizure. There are no indications that she has increased risk of having a recurrent events. 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 It is doubtful that this is the etiology of the patient unresponsiveness and likely is incidental. She has been placed on aspirin for secondary stroke prevention. An EEG will be obtained. An echocardiography has also been obtained and the results will be pending. A carotid duplex Doppler will also be obtained although any abnormalities on the scan will be considered asymptomatic given that she has a posterior circulation infarct.    The patient a 80 year old black female who presented to the hospital with severe abdominal pain. She has gallstones of the common bile duct. She underwent to ERCP is and stent placement in the last few days. The patient was found to be unresponsive this morning. She was noted to be stable in terms of blood pressure. Saturation was 95% on room air. No clonic or tonic activities were observed. Gradually as the day progressed, the patient's cognition has returned to baseline. No focal deficits are observed. Imaging is outlined as below but shows nothing on CT scan and a tiny infarct on MRI. The patient resides in a nursing home where she ambulates with a walker. It appears that her abdominal pain has improved. She has been started on a diet to progress as tolerated.  GENERAL: This is a pleasant lady who is in no acute distress.  HEENT: Supple. Atraumatic normocephalic.   ABDOMEN: soft  EXTREMITIES: No edema. There is marked arthritic changes of the knees.   BACK: Normal.  SKIN: Normal by inspection.    MENTAL STATUS: She is currently  awake and alert. She knows she is in the hospital in [place]. She tells me that his 2015 but not oriented to the month.  CRANIAL NERVES: Pupils are equal, round and reactive to light and accommodation; extra ocular movements are full, there is no significant nystagmus; visual fields are full; upper and lower facial muscles are normal in strength and symmetric, there is no flattening of the nasolabial folds; tongue is midline; uvula is midline; shoulder elevation is normal.  MOTOR: She has 4+5 strength in the upper extremities. Bulk and tone are normal. She has significant leg weakness graded as 3/5.  COORDINATION: Left finger to nose is normal, right finger to nose is normal, No rest tremor; no intention tremor; no postural tremor; no bradykinesia.  REFLEXES: Deep tendon reflexes are symmetrical and normal. Babinski reflexes are flexor bilaterally.   SENSATION: Normal to light touch.    Blood pressure 123/62, pulse 85, temperature 98.3 F (36.8 C), temperature source Oral, resp. rate 20, height 5' 2"  (1.575 m), weight 48.353 kg (106 lb 9.6 oz), SpO2 95.00%.  Past Medical History  Diagnosis Date  . Urinary retention   . Gastritis   . Esophageal ulcer   . Salmonella sepsis   . Degenerative disk disease   . Spinal stenosis   . Asthma   . COPD (chronic obstructive pulmonary disease)   . Diabetes mellitus, type 2   . Diverticulosis of colon   . GERD (gastroesophageal reflux disease)   . Hypertension   . Osteoarthritis   . Hiatal hernia   .  Pancreatitis   . S/P endoscopy 2009    Dr. Oneida Alar: gastritis  . Diverticulitis   . S/P endoscopy March 2012    Dr. Gala Romney: NSAID induced ulcer  . Hypothyroidism   . MGUS (monoclonal gammopathy of unknown significance)     Past Surgical History  Procedure Laterality Date  . Cervical fusion    . Cholecystectomy    . Ercp N/A 02/02/2014    Procedure: ENDOSCOPIC RETROGRADE CHOLANGIOPANCREATOGRAPHY (ERCP), STONE BASKET EXTRACTION;  Surgeon:  Daneil Dolin, MD;  Location: [facility];  Service: Endoscopy;  Laterality: N/A;  . Sphincterotomy N/A 02/02/2014    Procedure: SPHINCTEROTOMY;  Surgeon: Daneil Dolin, MD;  Location: [facility];  Service: Endoscopy;  Laterality: N/A;  . Balloon dilation N/A 02/02/2014    Procedure: BALLOON DILATION;  Surgeon: Daneil Dolin, MD;  Location: [facility];  Service: Endoscopy;  Laterality: N/A;  . Biliary stent placement N/A 02/02/2014    Procedure: BILIARY STENT PLACEMENT;  Surgeon: Daneil Dolin, MD;  Location: [facility];  Service: Endoscopy;  Laterality: N/A;  . Spyglass cholangioscopy N/A 02/02/2014    Procedure: DJSHFWYO CHOLANGIOSCOPY;  Surgeon: Daneil Dolin, MD;  Location: [facility];  Service: Endoscopy;  Laterality: N/A;  . Esophagogastroduodenoscopy N/A 02/05/2014    Procedure: ESOPHAGOGASTRODUODENOSCOPY (EGD);  Surgeon: [name], MD;  Location: [facility];  Service: Endoscopy;  Laterality: N/A;    Family History  Problem Relation Age of Onset  . Diabetes Mother     Social History:  reports that she has never smoked. She has never used smokeless tobacco. She reports that she does not drink alcohol or use illicit drugs.  Allergies: No Known Allergies  Medications: Prior to Admission medications   Medication Sig Start Date End Date Taking? Authorizing Provider  Calcium Citrate-Vitamin D 200-250 MG-UNIT TABS Take 1 tablet by mouth daily.   Yes Historical Provider, MD  docusate sodium (COLACE) 100 MG capsule Take 1 capsule (100 mg total) by mouth every 12 (twelve) hours. 03/02/13  Yes Johnna Acosta, MD  ferrous sulfate 325 (65 FE) MG tablet Take 325 mg by mouth daily with breakfast.   Yes Historical Provider, MD  furosemide (LASIX) 40 MG tablet Take 40 mg by mouth daily.    Yes Historical Provider, MD  HYDROcodone-acetaminophen (NORCO/VICODIN) 5-325 MG per tablet Take 1 tablet by mouth every 6 (six) hours as needed for moderate pain.   Yes Historical Provider, MD  levothyroxine (SYNTHROID,  LEVOTHROID) 25 MCG tablet Take 25 mcg by mouth daily before breakfast.   Yes Historical Provider, MD  montelukast (SINGULAIR) 10 MG tablet Take 10 mg by mouth every morning.    Yes Historical Provider, MD  Multiple Vitamin (DAILY VITAMIN PO) Take 1 tablet by mouth daily.   Yes Historical Provider, MD  pantoprazole (PROTONIX) 40 MG tablet Take 40 mg by mouth daily.    Yes Historical Provider, MD  solifenacin (VESICARE) 5 MG tablet Take 5 mg by mouth daily.   Yes Historical Provider, MD  theophylline (THEODUR) 200 MG 12 hr tablet Take 200 mg by mouth 2 (two) times daily.    Yes Historical Provider, MD  zolpidem (AMBIEN) 5 MG tablet Take 5 mg by mouth at bedtime as needed for sleep.    Yes Historical Provider, MD  amoxicillin-clavulanate (AUGMENTIN) 500-125 MG per tablet Take 1 tablet (500 mg total) by mouth 2 (two) times daily. 02/04/14   Samuella Cota, MD    Scheduled Meds: .  stroke: mapping our  early stages of recovery book   Does not apply Once  . amoxicillin-clavulanate  1 tablet Oral BID  . aspirin  300 mg Rectal Daily   Or  . aspirin  325 mg Oral Daily  . calcium-vitamin D  1 tablet Oral Q breakfast  . darifenacin  7.5 mg Oral Daily  . docusate sodium  100 mg Oral BID  . feeding supplement (ENSURE COMPLETE)  237 mL Oral TID WC & HS  . insulin aspart  0-9 Units Subcutaneous Q6H  . levothyroxine  25 mcg Oral QAC breakfast  . montelukast  10 mg Oral q morning - 10a  . pantoprazole  40 mg Oral BID AC  . sodium chloride  3 mL Intravenous Q12H   Continuous Infusions: . dextrose 5 % and 0.9% NaCl     PRN Meds:.acetaminophen, ondansetron (ZOFRAN) IV, ondansetron, zolpidem     Results for orders placed during the hospital encounter of 02/01/14 (from the past 48 hour(s))  GLUCOSE, CAPILLARY     Status: Abnormal   Collection Time    02/04/14  5:41 PM      Result Value Ref Range   Glucose-Capillary 102 (*) 70 - 99 mg/dL  GLUCOSE, CAPILLARY     Status: Abnormal   Collection Time      02/04/14 11:59 PM      Result Value Ref Range   Glucose-Capillary 123 (*) 70 - 99 mg/dL   Comment 1 Notify RN    GLUCOSE, CAPILLARY     Status: Abnormal   Collection Time    02/05/14  5:55 AM      Result Value Ref Range   Glucose-Capillary 144 (*) 70 - 99 mg/dL   Comment 1 Notify RN    GLUCOSE, CAPILLARY     Status: Abnormal   Collection Time    02/05/14 11:40 AM      Result Value Ref Range   Glucose-Capillary 122 (*) 70 - 99 mg/dL  URINALYSIS, ROUTINE W REFLEX MICROSCOPIC     Status: None   Collection Time    02/05/14  2:00 PM      Result Value Ref Range   Color, Urine YELLOW  YELLOW   APPearance CLEAR  CLEAR   Specific Gravity, Urine 1.010  1.005 - 1.030   pH 8.0  5.0 - 8.0   Glucose, UA NEGATIVE  NEGATIVE mg/dL   Hgb urine dipstick NEGATIVE  NEGATIVE   Bilirubin Urine NEGATIVE  NEGATIVE   Ketones, ur NEGATIVE  NEGATIVE mg/dL   Protein, ur NEGATIVE  NEGATIVE mg/dL   Urobilinogen, UA 0.2  0.0 - 1.0 mg/dL   Nitrite NEGATIVE  NEGATIVE   Leukocytes, UA NEGATIVE  NEGATIVE   Comment: MICROSCOPIC NOT DONE ON URINES WITH NEGATIVE PROTEIN, BLOOD, LEUKOCYTES, NITRITE, OR GLUCOSE <1000 mg/dL.  GLUCOSE, CAPILLARY     Status: None   Collection Time    02/05/14  6:12 PM      Result Value Ref Range   Glucose-Capillary 95  70 - 99 mg/dL  GLUCOSE, CAPILLARY     Status: None   Collection Time    02/05/14 11:40 PM      Result Value Ref Range   Glucose-Capillary 96  70 - 99 mg/dL  GLUCOSE, CAPILLARY     Status: Abnormal   Collection Time    02/06/14  6:39 AM      Result Value Ref Range   Glucose-Capillary 103 (*) 70 - 99 mg/dL  TROPONIN I     Status: None  Collection Time    02/06/14  7:20 AM      Result Value Ref Range   Troponin I <0.30  <0.30 ng/mL   Comment:            Due to the release kinetics of cTnI,     a negative result within the first hours     of the onset of symptoms does not rule out     myocardial infarction with certainty.     If myocardial infarction is  still suspected,     repeat the test at appropriate intervals.  COMPREHENSIVE METABOLIC PANEL     Status: Abnormal   Collection Time    02/06/14  7:20 AM      Result Value Ref Range   Sodium 138  137 - 147 mEq/L   Potassium 3.8  3.7 - 5.3 mEq/L   Chloride 101  96 - 112 mEq/L   CO2 27  19 - 32 mEq/L   Glucose, Bld 84  70 - 99 mg/dL   BUN 19  6 - 23 mg/dL   Creatinine, Ser 0.97  0.50 - 1.10 mg/dL   Calcium 9.0  8.4 - 10.5 mg/dL   Total Protein 5.9 (*) 6.0 - 8.3 g/dL   Albumin 2.5 (*) 3.5 - 5.2 g/dL   AST 19  0 - 37 U/L   ALT 45 (*) 0 - 35 U/L   Alkaline Phosphatase 222 (*) 39 - 117 U/L   Total Bilirubin 0.6  0.3 - 1.2 mg/dL   GFR calc non Af Amer 47 (*) >90 mL/min   GFR calc Af Amer 55 (*) >90 mL/min   Comment: (NOTE)     The eGFR has been calculated using the CKD EPI equation.     This calculation has not been validated in all clinical situations.     eGFR's persistently <90 mL/min signify possible Chronic Kidney     Disease.   Anion gap 10  5 - 15  CBC WITH DIFFERENTIAL     Status: Abnormal   Collection Time    02/06/14  7:20 AM      Result Value Ref Range   WBC 10.0  4.0 - 10.5 K/uL   RBC 3.70 (*) 3.87 - 5.11 MIL/uL   Hemoglobin 12.0  12.0 - 15.0 g/dL   HCT 34.9 (*) 36.0 - 46.0 %   MCV 94.3  78.0 - 100.0 fL   MCH 32.4  26.0 - 34.0 pg   MCHC 34.4  30.0 - 36.0 g/dL   RDW 13.7  11.5 - 15.5 %   Platelets 199  150 - 400 K/uL   Neutrophils Relative % 75  43 - 77 %   Neutro Abs 7.5  1.7 - 7.7 K/uL   Lymphocytes Relative 12  12 - 46 %   Lymphs Abs 1.2  0.7 - 4.0 K/uL   Monocytes Relative 11  3 - 12 %   Monocytes Absolute 1.1 (*) 0.1 - 1.0 K/uL   Eosinophils Relative 2  0 - 5 %   Eosinophils Absolute 0.2  0.0 - 0.7 K/uL   Basophils Relative 0  0 - 1 %   Basophils Absolute 0.0  0.0 - 0.1 K/uL  LACTIC ACID, PLASMA     Status: None   Collection Time    02/06/14  7:20 AM      Result Value Ref Range   Lactic Acid, Venous 0.8  0.5 - 2.2 mmol/L  BLOOD GAS, ARTERIAL  Status: Abnormal   Collection Time    02/06/14  7:35 AM      Result Value Ref Range   FIO2 0.21     O2 Content 21.0     Delivery systems ROOM AIR     pH, Arterial 7.420  7.350 - 7.450   pCO2 arterial 40.9  35.0 - 45.0 mmHg   pO2, Arterial 67.6 (*) 80.0 - 100.0 mmHg   Bicarbonate 26.0 (*) 20.0 - 24.0 mEq/L   TCO2 23.5  0 - 100 mmol/L   Acid-Base Excess 2.0  0.0 - 2.0 mmol/L   O2 Saturation 90.3     Patient temperature 37.0     Collection site LEFT RADIAL     Drawn by 635     Comment: CORRECTED ON 09/21 AT 1058: PREVIOUSLY REPORTED AS 2354   Sample type ARTERIAL     Allens test (pass/fail) PASS  PASS  GLUCOSE, CAPILLARY     Status: None   Collection Time    02/06/14 11:48 AM      Result Value Ref Range   Glucose-Capillary 78  70 - 99 mg/dL   Comment 1 Notify RN    GLUCOSE, CAPILLARY     Status: Abnormal   Collection Time    02/06/14  1:35 PM      Result Value Ref Range   Glucose-Capillary 69 (*) 70 - 99 mg/dL   Comment 1 Notify RN    GLUCOSE, CAPILLARY     Status: Abnormal   Collection Time    02/06/14  2:16 PM      Result Value Ref Range   Glucose-Capillary 113 (*) 70 - 99 mg/dL   Comment 1 Documented in Chart     Comment 2 Notify RN    PROTIME-INR     Status: Abnormal   Collection Time    02/06/14  3:19 PM      Result Value Ref Range   Prothrombin Time 26.0 (*) 11.6 - 15.2 seconds   INR 2.38 (*) 0.00 - 1.49  APTT     Status: Abnormal   Collection Time    02/06/14  3:19 PM      Result Value Ref Range   aPTT 177 (*) 24 - 37 seconds   Comment: CORRECTED RESULTS CALLED TO:     HAWKINS,M. AT 1704 ON 02/06/2014 BY BAUGHAM,M.                IF BASELINE aPTT IS ELEVATED,     SUGGEST PATIENT RISK ASSESSMENT     BE USED TO DETERMINE APPROPRIATE     ANTICOAGULANT THERAPY.     CORRECTED ON 09/21 AT 1703: PREVIOUSLY REPORTED AS 57        IF BASELINE aPTT IS ELEVATED, SUGGEST PATIENT RISK ASSESSMENT BE USED TO DETERMINE APPROPRIATE ANTICOAGULANT THERAPY.     Studies/Results:  BRAIN MRI Tiny acute nonhemorrhagic infarct inferior right cerebellum.  Prominent small vessel disease type changes.  Global atrophy without hydrocephalus.  Degenerative changes C1-2 articulation with superior projection of  the dens which causes contact with the cervical medullary junction  and mild spinal stenosis. C3-4 cervical spondylotic changes with  spinal stenosis and mild cord flattening.  HEAD CT No acute intracranial process.  Involutional changes. Moderate white matter changes suggest chronic  small vessel ischemic disease.     Kobi Aller A. Merlene Laughter, M.D.  Diplomate, Tax adviser of Psychiatry and Neurology ( Neurology). 02/06/2014, 5:23 PM

## 2014-02-06 NOTE — Progress Notes (Signed)
PROGRESS NOTE  [name] YFV:[id number] DOB: 08-11-1912 DOA: 02/01/2014 PCP: [name], MD  Summary: 80 year old woman sent to the emergency department for abdominal pain, low-grade fever. Evaluation revealed dilatation of the hepatic ducts and distal common bile duct stone. Patient admitted for cholangitis, choledocholithiasis, GI consultation.  Assessment/Plan: 1. Acute encephalopathy, resolved, etiology unclear. No focal deficits and no seizure activity; may have resolved with Ativan or just spontaneously. Extensive workup unremarkable, no signs of infection or complication with procedure. No evidence of stroke. 2. Choledocholithiasis, suspected acute cholangitis, status post ERCP with biliary sphincterotomy, stone extraction and biliary stent placement. Imaging reviewed stent migration. EGD unable to relocate. Repeat ERCP planned. 3. Abdominal pain. Repeat imaging unrevealing, may be chronic. 4. Urinary retention. Foley placed 9/20. 5. Diabetes mellitus type 2.  Remains stable. 6. History of COPD.  Remains stable   Currently stable, plan complete workup for encephalopathy with MRI brain (ok per Dr. Oneida Alar) and EEG; if MRI negative would not pursue further stroke evaluation  ERCP cancelled (d/w Dr. Oneida Alar). Advance diet. If w/u negative can proceed with ERCP 9/22.  Continue foley for now until procedures complete  Code status: full DVT proph: SCDs Family: [place] Disposition: return to [facility], MD  [facility]  Pager [phone] If 7PM-7AM, please contact night-coverage at [email or web address], password [facility] 02/06/2014, 8:22 AM  LOS: 5 days   Consultants:  Gastroenterology  Procedures:  9/17 ERCP IMPRESSION: Juxtampullary diverticula, massively dilated bile duct. Choledocholithiasis. Status post biliary sphincterotomy. Balloon and basket stone extraction and stent placement. Attempted cholangioscopy as described above.  9/20  EGD FINDINGS:  1. SCOPE TRAUMA IN MID-ESOPHAGUS AND CARDIA  2. PATENT SCHATZKI'S RING  3. MODERATE GASTRITIS  4. STENT EMBEDDED IN DIVERTICULUM WITH ULCERATED/ERYTHEMATOUS BASE. UNABLE TO RE-POSITION STENT.  Antibiotics:  Unasyn 9/16 >> 9/19  Augmentin 9/19 >>  HPI/Subjective: Discussed with night MD Dr. Shanon Brow and [place]. Uneventful night until ~4 AM when patient was found awake but unresponsive. Exam was nonfocal, no evidence of seizure, not a candidate for tPA secondary to age, lack of focal deficit and recent procedure. STAT CT head, CXR, EKG, ABG, lactic acid, CBG, CT abd unremarkable. Treated with Ativan.  Now awake, alert. "What happened?". Some abd pain. No chest pain, SOB, no complaints.  Objective: Filed Vitals:   02/05/14 1250 02/05/14 1400 02/05/14 2240 02/06/14 0552  BP:  177/73 142/70 123/62  Pulse: 98 89 106 85  Temp:  97.7 F (36.5 C) 97.8 F (36.6 C) 98.3 F (36.8 C)  TempSrc:  Oral Oral Oral  Resp: 21 17 20    Height:      Weight:      SpO2: 100% 100% 100% 95%    Intake/Output Summary (Last 24 hours) at 02/06/14 5993 Last data filed at 02/06/14 0551  Gross per 24 hour  Intake     40 ml  Output   1475 ml  Net  -1435 ml     Filed Weights   02/01/14 2319 02/04/14 0450  Weight: 42 kg (92 lb 9.5 oz) 48.353 kg (106 lb 9.6 oz)    Exam:      afebrile, VSS, no hypoxia  Appears calm and comfortabel  Speech fluent and clear, alert, grossly normal mentation (at baseline)  Eyes PERRL   CV RRR no mrg no LE edema. Telemetry SR.  Resp CTA bilaterally no wrr. Normal resp effort  Abd soft, nd, mild tenderness generalized  Neuro no pronator drift, grossly normal  tone and strength all extremities with no focal deficits. CN II-XII intact  Data Reviewed:   UOP 1475  ABG noted, pO2 67 otherwise unremarkable.  LFTs improving, lytes normal  Troponin, lactic acid, CBC, CXR unremarkable  CT head, abdomen unremarkable  EKG normal SR no acute  changes, anteroseptal MI, old.  Scheduled Meds: . amoxicillin-clavulanate  1 tablet Oral BID  . calcium-vitamin D  1 tablet Oral Q breakfast  . darifenacin  7.5 mg Oral Daily  . docusate sodium  100 mg Oral BID  . insulin aspart  0-9 Units Subcutaneous Q6H  . levothyroxine  25 mcg Oral QAC breakfast  . montelukast  10 mg Oral q morning - 10a  . ondansetron (ZOFRAN) IV  4 mg Intravenous TID AC  . pantoprazole  40 mg Oral BID AC  . sodium chloride  3 mL Intravenous Q12H   Continuous Infusions: . sodium chloride 50 mL/hr at 02/05/14 1736    Principal Problem:   Acute encephalopathy Active Problems:   DIABETES MELLITUS, TYPE II   HYPERTENSION   MGUS (monoclonal gammopathy of unknown significance)   Cholangitis   Cholelithiasis with biliary obstruction   Biliary obstruction   Urinary retention   Time:  30 minutes

## 2014-02-07 ENCOUNTER — Inpatient Hospital Stay (HOSPITAL_COMMUNITY)
Admit: 2014-02-07 | Discharge: 2014-02-07 | Disposition: A | Discharge status: Home / Self Care | Payer: PRIVATE HEALTH INSURANCE | Attending: Neurology | Admitting: Neurology

## 2014-02-07 ENCOUNTER — Encounter (HOSPITAL_COMMUNITY)
Admission: EM | Disposition: A | Discharge status: Skilled Nursing Facility | Payer: Self-pay | Source: Home / Self Care | Attending: Family Medicine

## 2014-02-07 DIAGNOSIS — E43 Unspecified severe protein-calorie malnutrition: Secondary | ICD-10-CM

## 2014-02-07 DIAGNOSIS — I10 Essential (primary) hypertension: Secondary | ICD-10-CM

## 2014-02-07 DIAGNOSIS — J449 Chronic obstructive pulmonary disease, unspecified: Secondary | ICD-10-CM

## 2014-02-07 DIAGNOSIS — I6789 Other cerebrovascular disease: Secondary | ICD-10-CM

## 2014-02-07 LAB — GLUCOSE, CAPILLARY
GLUCOSE-CAPILLARY: 143 mg/dL — AB (ref 70–99)
GLUCOSE-CAPILLARY: 109 mg/dL — AB (ref 70–99)
Glucose-Capillary: 127 mg/dL — ABNORMAL HIGH (ref 70–99)
Glucose-Capillary: 112 mg/dL — ABNORMAL HIGH (ref 70–99)

## 2014-02-07 LAB — HEMOGLOBIN A1C
Hgb A1c MFr Bld: 6.1 % — ABNORMAL HIGH (ref <5.7)
MEAN PLASMA GLUCOSE: 128 mg/dL — AB (ref <117)

## 2014-02-07 LAB — LIPID PANEL
CHOL/HDL RATIO: 5 ratio
Cholesterol: 124 mg/dL (ref 0–200)
HDL: 25 mg/dL — ABNORMAL LOW (ref >39)
LDL CALC: 70 mg/dL (ref 0–99)
Triglycerides: 145 mg/dL (ref <150)
VLDL: 29 mg/dL (ref 0–40)

## 2014-02-07 SURGERY — ERCP, WITH INTERVENTION IF INDICATED
Anesthesia: Choice

## 2014-02-07 NOTE — Progress Notes (Signed)
OT Cancellation Note  Patient Details Name: [name] MRN: [id number] DOB: 1912/09/08   Cancelled Treatment:    Reason Eval/Treat Not Completed: Patient at procedure or test/ unavailable (pt receiving echocardiogram.  will re-attempt OT eval at later time/date.)  Bea Graff, [place], OTR/L [phone]  02/07/2014, 9:22 AM

## 2014-02-07 NOTE — Progress Notes (Signed)
[facility]                                                                              Patient Demographics  [name], is a 80 y.o. female, DOB - [birth year], KJZ:[id number]  Admit date - 02/01/2014   Admitting Physician Orvan Falconer, MD  Outpatient Primary MD for the patient is [name], MD  LOS - 6   Chief Complaint  Patient presents with  . Abdominal Pain      HPI on 02/01/2014 [name] is an 80 y.o. female sent in from [facility] for abdominal pain. She did not respond to the attending physician, so history was taken from record and EDP and staff. She was more alert and was able to converse with the RN.  She did not have nausea, or vomiting, but did have low grade temperature. She did not have any black or bloody stool. Evalutuion in the ER included a CT of the abd./pelvis which showed dilated intra and extra hepatic ducts, with periportal edema. She also has distal common bile duct stone with CBD dilated to 76m. She has had CCY. Her LFTS were grossly elevated with AST of 1058, ASL 349 and AlK 549, with total bili of 1.6, mostly direct. Her Cr was 1.02, and she has normal electrolytes. Her Lipase was normal. Her WBC was elevated to 14K, and her temperature in the ER was 100.8. Her UA showed no infection, and her CXR was negative. Hopsitalist was asked to admit her for further evaluation and Tx.    Assessment & Plan   Acute encephalopathy -Secondary to unclear etiology at this time -Neurology consulted and appreciated -Pending EEG -Patient has no signs of infection -Unlikely related to patient's acute CVA -Neurology consulted and appreciated  Choledocholithiasis, suspected acute cholangitis -Patient has had ERCP with biliary sphincterotomy and stone extraction with stent placement in the past -Gastroenterology consulted and appreciated -Imaging showed stent migration, it was unable to be relocated with EGD -Repeat ERCP was planned for this  morning however canceled due to patient's current state -Spoke with gastroenterology PA, patient may have an outpatient ERCP. -Currently patient is stable -Continue Augmentin and PPI  -Continue dysphagia 2 diet  Abdominal pain -Appears to resolve this morning -Imaging unrevealing  Urinary retention -Foley was placed on 02/05/2014  Diabetes mellitus, type II -Continue insulin sliding scale with CBG monitoring  History of COPD -Appears to compensated  Code Status: Full  Family Communication: Bedside  Disposition Plan: Admitted  Time Spent in minutes   30  minutes  Procedures  02/02/2014 ERCP  Consults   Gastroenterology Neurology  DVT Prophylaxis  SCDs  Lab Results  Component Value Date   PLT 199 02/06/2014    Medications  Scheduled Meds: . amoxicillin-clavulanate  1 tablet Oral BID  . aspirin  300 mg Rectal Daily   Or  . aspirin  325 mg Oral Daily  . calcium-vitamin D  1 tablet Oral Q breakfast  . darifenacin  7.5 mg Oral Daily  . docusate sodium  100 mg Oral BID  . feeding supplement (ENSURE COMPLETE)  237 mL Oral TID WC & HS  . insulin aspart  0-9 Units  Subcutaneous Q6H  . levothyroxine  25 mcg Oral QAC breakfast  . montelukast  10 mg Oral q morning - 10a  . pantoprazole  40 mg Oral BID AC  . sodium chloride  3 mL Intravenous Q12H   Continuous Infusions:  PRN Meds:.acetaminophen, ondansetron (ZOFRAN) IV, ondansetron, zolpidem  Antibiotics    Anti-infectives   Start     Dose/Rate Route Frequency Ordered Stop   02/04/14 1000  amoxicillin-clavulanate (AUGMENTIN) 500-125 MG per tablet 500 mg     1 tablet Oral 2 times daily 02/03/14 1513     02/04/14 0000  amoxicillin-clavulanate (AUGMENTIN) 500-125 MG per tablet     1 tablet Oral 2 times daily 02/03/14 1514     02/03/14 1800  ampicillin-sulbactam (UNASYN) 1.5 g in sodium chloride 0.9 % 50 mL IVPB     1.5 g 100 mL/hr over 30 Minutes Intravenous Every 6 hours 02/03/14 1513 02/04/14 0110   02/02/14  0600  ampicillin-sulbactam (UNASYN) 1.5 g in sodium chloride 0.9 % 50 mL IVPB  Status:  Discontinued     1.5 g 100 mL/hr over 30 Minutes Intravenous Every 6 hours 02/02/14 0005 02/03/14 1513   02/02/14 0000  ampicillin-sulbactam (UNASYN) 1.5 g in sodium chloride 0.9 % 50 mL IVPB  Status:  Discontinued     1.5 g 100 mL/hr over 30 Minutes Intravenous Every 6 hours 02/01/14 2358 02/02/14 0005   02/01/14 2145  Ampicillin-Sulbactam (UNASYN) 3 g in sodium chloride 0.9 % 100 mL IVPB     3 g 100 mL/hr over 60 Minutes Intravenous  Once 02/01/14 2141 02/01/14 2320        Subjective:   [name] seen and examined today.  Patient currently have an echocardiogram conducted. Denies any pain at this time. Appears to be comfortable.   Objective:   Filed Vitals:   02/06/14 2138 02/06/14 2341 02/07/14 0555 02/07/14 1000  BP: 118/48 119/45 130/51 127/54  Pulse: 68 74 71 74  Temp: 98.6 F (37 C) 98.5 F (36.9 C) 97.9 F (36.6 C) 98.2 F (36.8 C)  TempSrc: Oral Oral Oral Oral  Resp: _0 Height:      Weight:      SpO2: 100% 100% 100% 100%    Wt Readings from Last 3 Encounters:  02/04/14 48.353 kg (106 lb 9.6 oz)  02/04/14 48.353 kg (106 lb 9.6 oz)  02/04/14 48.353 kg (106 lb 9.6 oz)     Intake/Output Summary (Last 24 hours) at 02/07/14 1410 Last data filed at 02/07/14 0700  Gross per 24 hour  Intake    120 ml  Output   1250 ml  Net  -1130 ml    Exam  General: Well developed, well nourished, NAD, appears stated age  HEENT: NCAT, mucous membranes moist.   Cardiovascular: S1 S2 auscultated, no rubs, murmurs or gallops. Regular rate and rhythm.  Respiratory: Clear to auscultation bilaterally with equal chest rise  Abdomen: Soft, nontender, nondistended, + bowel sounds  Extremities: warm dry without cyanosis clubbing or edema  Neuro: Awake and alert, unable to fully assess orientation, no focal deficits  Data Review   Micro Results Recent Results (from the  past 240 hour(s))  CULTURE, BLOOD (ROUTINE X 2)     Status: None   Collection Time    02/01/14  9:55 PM      Result Value Ref Range Status   Specimen Description BLOOD LEFT HAND   Final   Special Requests BOTTLES DRAWN AEROBIC AND ANAEROBIC  [place]   Final   Culture NO GROWTH 5 DAYS   Final   Report Status 02/06/2014 FINAL   Final  CULTURE, BLOOD (ROUTINE X 2)     Status: None   Collection Time    02/01/14  9:55 PM      Result Value Ref Range Status   Specimen Description BLOOD LEFT ANTECUBITAL   Final   Special Requests     Final   Value: BOTTLES DRAWN AEROBIC AND ANAEROBIC AEB=8CC ANA=4CC   Culture NO GROWTH 5 DAYS   Final   Report Status 02/06/2014 FINAL   Final  MRSA PCR SCREENING     Status: None   Collection Time    02/01/14 11:55 PM      Result Value Ref Range Status   MRSA by PCR NEGATIVE  NEGATIVE Final   Comment:            The GeneXpert MRSA Assay (FDA     approved for NASAL specimens     only), is one component of a     comprehensive MRSA colonization     surveillance program. It is not     intended to diagnose MRSA     infection nor to guide or     monitor treatment for     MRSA infections.    Radiology Reports Ct Abdomen Wo Contrast  02/06/2014   CLINICAL DATA:  Altered mental status. 80 year old with abdominal pain and low-grade fever. Admitted for cholangitis and choledocholithiasis.  EXAM: CT ABDOMEN WITHOUT CONTRAST  TECHNIQUE: Multidetector CT imaging of the abdomen was performed following the standard protocol without IV contrast.  COMPARISON:  02/04/2014  FINDINGS: There are stable bilateral small pleural effusions, left side greater than right. No evidence for free air.  Again noted is diffuse pneumobilia consistent with prior biliary intervention. There continues to be a non metallic biliary stent in the distal common bile duct that extends into the duodenum. Adjacent to the proximal aspect of the stent, there appears to be a calcified stone on sequence 2,  image 23. This 1 cm stone is best visualized on sequence 3, image 33. Gallbladder has been removed. No acute abnormality to the liver. Spleen is small and unchanged. No acute abnormality involving the adrenal glands or pancreas. There are low-density structures in both kidneys which are difficult to evaluate on this exam but likely represent cortical cysts. Negative for hydronephrosis.  The abdominal aorta is heavily calcified without aneurysm. Calcifications along the periphery of the visualized uterus. There are multiple colonic diverticula in the left colon. No significant free fluid or lymphadenopathy within the abdomen. No acute abnormality involving the stomach. Multilevel disc and facet disease in the lumbar spine.  IMPRESSION: Stable position of the biliary stent. There appears to be a stone adjacent to the proximal aspect of the stent. Again noted is diffuse pneumobilia.  Diffuse colonic diverticulosis.  Stable bilateral pleural effusions, left side greater than right.   Electronically Signed   By: Markus Daft M.D.   On: 02/06/2014 08:09   Dg Chest 1 View  02/06/2014   CLINICAL DATA:  Altered mental status  EXAM: CHEST - 1 VIEW  COMPARISON:  02/01/2014  FINDINGS: Haziness of the lower chest which correlates with small pleural effusions and atelectasis on contemporaneous abdominal CT. Changes are more extensive on the left. No edema. Skin folds noted biapically; no pneumothorax suspected. Normal heart size and upper mediastinal contours for age. No acute osseous findings.  IMPRESSION: Small bilateral pleural  effusion with atelectasis. No edema or pneumonia.   Electronically Signed   By: Jorje Guild M.D.   On: 02/06/2014 06:32   Ct Head Wo Contrast  02/06/2014   CLINICAL DATA:  Altered mental status.  EXAM: CT HEAD WITHOUT CONTRAST  TECHNIQUE: Contiguous axial images were obtained from the base of the skull through the vertex without intravenous contrast.  COMPARISON:  CT of the head January 22, 2008  FINDINGS: The ventricles and sulci are normal for age. No intraparenchymal hemorrhage, mass effect nor midline shift. Patchy patchy to confluent supratentorial white matter hypodensities are within normal range for patient's age and though non-specific suggest sequelae of chronic small vessel ischemic disease. No acute large vascular territory infarcts.  No abnormal extra-axial fluid collections. Basal cisterns are patent. Severe calcific atherosclerosis of the carotid siphons.  No skull fracture. Patient is osteopenic. The included ocular globes and orbital contents are non-suspicious. Status post LEFT ocular lens implant. The mastoid aircells and included paranasal sinuses are well-aerated.  IMPRESSION: No acute intracranial process.  Involutional changes. Moderate white matter changes suggest chronic small vessel ischemic disease.   Electronically Signed   By: Elon Alas   On: 02/06/2014 06:28   Mr Brain Wo Contrast  02/06/2014   CLINICAL DATA:  80 year old female presenting with weakness and unresponsiveness. Diabetes and hypertension. Initial encounter.  EXAM: MRI HEAD WITHOUT CONTRAST  TECHNIQUE: Multiplanar, multiecho pulse sequences of the brain and surrounding structures were obtained without intravenous contrast.  COMPARISON:  02/06/2014 CT.  11/17/2003 MR.  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  No intracranial hemorrhage.  Prominent small vessel disease type changes.  No intracranial mass lesion noted on this unenhanced exam.  Global atrophy without hydrocephalus.  Major intracranial vascular structures are patent.  Degenerative changes C1-2 articulation with inferior position of the C1 ring and superior projection of the dens which when combined with transverse ligament hypertrophy causes contact with the cervical medullary junction and mild spinal stenosis. C3-4 cervical spondylotic changes with spinal stenosis and mild cord flattening.  Incidentally  noted is a partially empty non expanded sella.  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  Prominent small vessel disease type changes.  Global atrophy without hydrocephalus.  Degenerative changes C1-2 articulation with superior projection of the dens which causes contact with the cervical medullary junction and mild spinal stenosis. C3-4 cervical spondylotic changes with spinal stenosis and mild cord flattening.   Electronically Signed   By: Chauncey Cruel M.D.   On: 02/06/2014 13:06   Ct Abdomen Pelvis W Contrast  02/04/2014   CLINICAL DATA:  Worsening abdominal pain after ERCP and sphincterotomy yesterday.  EXAM: CT ABDOMEN AND PELVIS WITH CONTRAST  TECHNIQUE: Multidetector CT imaging of the abdomen and pelvis was performed using the standard protocol following bolus administration of intravenous contrast.  CONTRAST:  146m OMNIPAQUE IOHEXOL 300 MG/ML IV. Oral contrast was not administered.  COMPARISON:  Multiple prior CT abdomen and pelvis: 02/01/2014, 12/14/2013, dating back to 11/17/2007.  FINDINGS: Endoscopic biliary stent with its proximal in the in the common bile duct and the distal end in what I believe is a small diverticulum arising from the descending duodenum laterally, as this was a contrast filled structure on the CT 3 days ago and is now filled with air. Gas within the common bile duct, common hepatic duct in the independent bile ducts in the liver, consistent with stent patency. Interval decrease in the caliber the common bile duct. Calcified granuloma in  the anterior segment right lobe of liver at the dome; no significant focal hepatic parenchymal abnormalities. Gallbladder surgically absent.  Atrophic spleen and pancreas without focal parenchymal abnormalities involving either organ. Normal adrenal glands. Cortical cysts involving both kidneys; no significant abnormality involving either kidney. Extensive aortoiliofemoral atherosclerosis without aneurysm or  dissection. Visceral arteries atherosclerotic though patent; high-grade stenosis at the origin of the celiac artery with widely patent SMA and IMA origins. No significant lymphadenopathy.  Normal-appearing stomach filled with food. Normal-appearing small bowel. Extensive diffuse colonic diverticulosis without evidence of acute diverticulitis. No ascites.  Markedly distended urinary bladder. Atrophic uterus consistent with age. No adnexal masses or free pelvic fluid.  Bone window images demonstrate osseous demineralization, degenerative disc disease and spondylosis at L3-4, L4-5 and L5-S1, facet degenerative changes at these levels, degenerative grade 1 spondylolisthesis of L3 on L4 and L4 on L5, and degenerative changes in the sacroiliac joints. Visualized lung bases demonstrate bilateral pleural effusions, left greater than right, with passive atelectasis in the lower lobes. Heart mildly enlarged with aortic valvular calcification and severe 3 vessel coronary atherosclerosis.  IMPRESSION: 1. No evidence of bowel perforation. 2. Endoscopic stent with its proximal end in the common bile duct and its distal end in a diverticulum arising from the lateral wall of the descending duodenum. 3. Interval decrease in caliber of the common bile duct after stent placement. Gas within the common bile duct, common hepatic duct and independent intrahepatic ducts is consistent with stent patency. 4. Extensive diffuse colonic diverticulosis without evidence of acute diverticulitis. 5. Distended urinary bladder. 6. Bilateral pleural effusions, left greater than right, and associated passive atelectasis in the lower lobes. 7. Severe generalized atherosclerosis as detailed above. 8. Osseous findings as above. These results were called by telephone at the time of interpretation on 02/04/2014 at 3:02 pm to Dr. Barney Drain , who verbally acknowledged these results.   Electronically Signed   By: Evangeline Dakin M.D.   On: 02/04/2014 15:02    Ct Abdomen Pelvis W Contrast  02/01/2014   CLINICAL DATA:  Gastritis, COPD, diverticulosis, hypertension, pancreatitis. Previous cholecystectomy. Abdominal pain.  EXAM: CT ABDOMEN AND PELVIS WITH CONTRAST  TECHNIQUE: Multidetector CT imaging of the abdomen and pelvis was performed using the standard protocol following bolus administration of intravenous contrast.  CONTRAST:  15m OMNIPAQUE IOHEXOL 300 MG/ML SOLN, 563mOMNIPAQUE IOHEXOL 300 MG/ML SOLN  COMPARISON:  CT of abdomen and pelvis on 12/14/2013  FINDINGS: Lower chest: Lung bases are unremarkable. Coronary artery calcification his significant. Heart is normal in size.  Upper abdomen: There is intrahepatic biliary ductal dilatation. Common bile duct is also dilated, measuring 13 mm. A distal common bile duct stone or stones again identified, similar in appearance to prior study. Since prior study, there has development of increased periportal edema. Status post cholecystectomy. There is no focal liver lesion. No focal abnormality identified within the spleen, pancreas, or adrenal glands. There are bilateral renal cysts. Small amount of fluid is identified around the spleen.  Bowel: Stomach and small bowel loops are normal in appearance. There are numerous colonic diverticula.  Pelvis: The uterus is present. No adnexal mass identified. No free pelvic fluid.  Retroperitoneum: There is atherosclerotic calcification of the abdominal aorta. No aneurysm.  Abdominal wall: Unremarkable  Osseous structures: There are significant degenerative changes in the lower lumbar spine with anterolisthesis of L3 on L4, L4 on L5, and L5 on S1. No suspicious lytic or blastic lesions.  IMPRESSION: 1. Intra and extrahepatic biliary ductal dilatation  with distal common bile duct stones. Increased periportal edema since prior study. 2. Status post cholecystectomy. 3. Significant colonic diverticulosis. No evidence for acute diverticulitis. 4. Coronary artery calcification.    Electronically Signed   By: Shon Hale M.D.   On: 02/01/2014 20:22   Dg Chest Portable 1 View  02/01/2014   CLINICAL DATA:  Chest pain  EXAM: PORTABLE CHEST - 1 VIEW  COMPARISON:  01/22/2008  FINDINGS: Cardiac shadow is within normal limits. The lungs are well aerated bilaterally. A prominent skin fold is noted over the left chest. Lung markings are noted beyond this region. No bony abnormality is seen.  IMPRESSION: Prominent skin fold low over the lateral left lung. No acute abnormality is noted.   Electronically Signed   By: Inez Catalina M.D.   On: 02/01/2014 16:09   Dg Ercp  02/02/2014   CLINICAL DATA:  Choledocholithiasis and cholangitis.  EXAM: ERCP  TECHNIQUE: Multiple spot images obtained with the fluoroscopic device and submitted for interpretation post-procedure.  COMPARISON:  CT on 02/01/2014.  FINDINGS: Images obtained with a C-arm during the ERCP procedure demonstrate cannulation of the common bile duct. Cholangiogram shows a tortuous and massively dilated common bile duct as well as intrahepatic biliary ductal dilatation. The opacified common bile duct shows irregular filling defects. A balloon sweep maneuver was performed.  IMPRESSION: Choledocholithiasis with massive common bile duct dilatation. A balloon sweep maneuver was performed.  These images were submitted for radiologic interpretation only. Please see the procedural report for the amount of contrast and the fluoroscopy time utilized.   Electronically Signed   By: Aletta Edouard M.D.   On: 02/02/2014 17:01    CBC  Recent Labs Lab 02/01/14 1630 02/02/14 0416 02/04/14 0615 02/06/14 0720  WBC 14.0* 14.9* 12.3* 10.0  HGB 13.4 11.7* 12.4 12.0  HCT 39.5 33.9* 36.3 34.9*  PLT PLATELET CLUMPS NOTED ON SMEAR, COUNT APPEARS ADEQUATE 188 177 199  MCV 96.8 94.2 95.3 94.3  MCH 32.8 32.5 32.5 32.4  MCHC 33.9 34.5 34.2 34.4  RDW 13.5 13.8 14.4 13.7  LYMPHSABS 0.3*  --   --  1.2  MONOABS 1.2*  --   --  1.1*  EOSABS 0.1  --   --   0.2  BASOSABS 0.0  --   --  0.0    Chemistries   Recent Labs Lab 02/01/14 1630 02/02/14 0416 02/03/14 0815 02/04/14 0615 02/06/14 0720  NA 141 142  --   --  138  K 4.1 3.4*  --   --  3.8  CL 96 100  --   --  101  CO2 29 29  --   --  27  GLUCOSE 168* 94  --   --  84  BUN 26* 20  --   --  19  CREATININE 1.02 0.99  --   --  0.97  CALCIUM 9.6 8.7  --   --  9.0  AST 1058* 518* 128* 59* 19  ALT 349* 282* 131* 100* 45*  ALKPHOS 549* 478* 364* 328* 222*  BILITOT 1.6* 1.5* 1.0 0.7 0.6   ------------------------------------------------------------------------------------------------------------------ estimated creatinine clearance is 24.7 ml/min (by C-G formula based on Cr of 0.97). ------------------------------------------------------------------------------------------------------------------ No results found for this basename: HGBA1C,  in the last 72 hours ------------------------------------------------------------------------------------------------------------------  Recent Labs  02/07/14 0623  CHOL 124  HDL 25*  LDLCALC 70  TRIG 145  CHOLHDL 5.0   ------------------------------------------------------------------------------------------------------------------ No results found for this basename: TSH, T4TOTAL, FREET3, T3FREE, THYROIDAB,  in  the last 72 hours ------------------------------------------------------------------------------------------------------------------ No results found for this basename: VITAMINB12, FOLATE, FERRITIN, TIBC, IRON, RETICCTPCT,  in the last 72 hours  Coagulation profile  Recent Labs Lab 02/06/14 1519  INR 2.38*    No results found for this basename: DDIMER,  in the last 72 hours  Cardiac Enzymes  Recent Labs Lab 02/06/14 0720  TROPONINI <0.30   ------------------------------------------------------------------------------------------------------------------ No components found with this basename: POCBNP,     [name],  [name] D.O. on 02/07/2014 at 2:10 PM  Between 7am to 7pm - Pager - [phone]  After 7pm go to [email or web address] - password [email or web address]  And look for the night coverage person covering for me after hours  [facility] Office  [phone]

## 2014-02-07 NOTE — Progress Notes (Signed)
EEG Completed; Results Pending  

## 2014-02-07 NOTE — Progress Notes (Signed)
  Echocardiogram 2D Echocardiogram has been performed.  [place], [place] 02/07/2014, 9:56 AM

## 2014-02-07 NOTE — Progress Notes (Signed)
Subjective: Denies abdominal pain, N/V.   Objective: Vital signs in last 24 hours: Temp:  [97.9 F (36.6 C)-98.6 F (37 C)] 97.9 F (36.6 C) (09/22 0555) Pulse Rate:  [68-80] 71 (09/22 0555) Resp:  [16-18] 18 (09/22 0555) BP: (118-135)/(45-51) 130/51 mmHg (09/22 0555) SpO2:  [100 %] 100 % (09/22 0555) Last BM Date: 02/05/14 General:   Alert and oriented to person.  Head:  Normocephalic and atraumatic. Abdomen:  Bowel sounds present, soft, non-tender, non-distended. No HSM or hernias noted. No rebound or guarding. . Neurologic:  Alert and oriented to person only   Intake/Output from previous day: 09/21 0701 - 09/22 0700 In: 120 [P.O.:120] Out: 750 [Urine:750] Intake/Output this shift:    Lab Results:  Recent Labs  02/06/14 0720  WBC 10.0  HGB 12.0  HCT 34.9*  PLT 199   BMET  Recent Labs  02/06/14 0720  NA 138  K 3.8  CL 101  CO2 27  GLUCOSE 84  BUN 19  CREATININE 0.97  CALCIUM 9.0   LFT  Recent Labs  02/06/14 0720  PROT 5.9*  ALBUMIN 2.5*  AST 19  ALT 45*  ALKPHOS 222*  BILITOT 0.6   PT/INR  Recent Labs  02/06/14 1519  LABPROT 26.0*  INR 2.38*     Studies/Results: Ct Abdomen Wo Contrast  02/06/2014   CLINICAL DATA:  Altered mental status. 80 year old with abdominal pain and low-grade fever. Admitted for cholangitis and choledocholithiasis.  EXAM: CT ABDOMEN WITHOUT CONTRAST  TECHNIQUE: Multidetector CT imaging of the abdomen was performed following the standard protocol without IV contrast.  COMPARISON:  02/04/2014  FINDINGS: There are stable bilateral small pleural effusions, left side greater than right. No evidence for free air.  Again noted is diffuse pneumobilia consistent with prior biliary intervention. There continues to be a non metallic biliary stent in the distal common bile duct that extends into the duodenum. Adjacent to the proximal aspect of the stent, there appears to be a calcified stone on sequence 2, image 23. This 1  cm stone is best visualized on sequence 3, image 33. Gallbladder has been removed. No acute abnormality to the liver. Spleen is small and unchanged. No acute abnormality involving the adrenal glands or pancreas. There are low-density structures in both kidneys which are difficult to evaluate on this exam but likely represent cortical cysts. Negative for hydronephrosis.  The abdominal aorta is heavily calcified without aneurysm. Calcifications along the periphery of the visualized uterus. There are multiple colonic diverticula in the left colon. No significant free fluid or lymphadenopathy within the abdomen. No acute abnormality involving the stomach. Multilevel disc and facet disease in the lumbar spine.  IMPRESSION: Stable position of the biliary stent. There appears to be a stone adjacent to the proximal aspect of the stent. Again noted is diffuse pneumobilia.  Diffuse colonic diverticulosis.  Stable bilateral pleural effusions, left side greater than right.   Electronically Signed   By: Markus Daft M.D.   On: 02/06/2014 08:09   Dg Chest 1 View  02/06/2014   CLINICAL DATA:  Altered mental status  EXAM: CHEST - 1 VIEW  COMPARISON:  02/01/2014  FINDINGS: Haziness of the lower chest which correlates with small pleural effusions and atelectasis on contemporaneous abdominal CT. Changes are more extensive on the left. No edema. Skin folds noted biapically; no pneumothorax suspected. Normal heart size and upper mediastinal contours for age. No acute osseous findings.  IMPRESSION: Small bilateral pleural effusion with atelectasis. No edema or  pneumonia.   Electronically Signed   By: Jorje Guild M.D.   On: 02/06/2014 06:32   Ct Head Wo Contrast  02/06/2014   CLINICAL DATA:  Altered mental status.  EXAM: CT HEAD WITHOUT CONTRAST  TECHNIQUE: Contiguous axial images were obtained from the base of the skull through the vertex without intravenous contrast.  COMPARISON:  CT of the head January 22, 2008  FINDINGS: The  ventricles and sulci are normal for age. No intraparenchymal hemorrhage, mass effect nor midline shift. Patchy patchy to confluent supratentorial white matter hypodensities are within normal range for patient's age and though non-specific suggest sequelae of chronic small vessel ischemic disease. No acute large vascular territory infarcts.  No abnormal extra-axial fluid collections. Basal cisterns are patent. Severe calcific atherosclerosis of the carotid siphons.  No skull fracture. Patient is osteopenic. The included ocular globes and orbital contents are non-suspicious. Status post LEFT ocular lens implant. The mastoid aircells and included paranasal sinuses are well-aerated.  IMPRESSION: No acute intracranial process.  Involutional changes. Moderate white matter changes suggest chronic small vessel ischemic disease.   Electronically Signed   By: Elon Alas   On: 02/06/2014 06:28   Mr Brain Wo Contrast  02/06/2014   CLINICAL DATA:  80 year old female presenting with weakness and unresponsiveness. Diabetes and hypertension. Initial encounter.  EXAM: MRI HEAD WITHOUT CONTRAST  TECHNIQUE: Multiplanar, multiecho pulse sequences of the brain and surrounding structures were obtained without intravenous contrast.  COMPARISON:  02/06/2014 CT.  11/17/2003 MR.  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  No intracranial hemorrhage.  Prominent small vessel disease type changes.  No intracranial mass lesion noted on this unenhanced exam.  Global atrophy without hydrocephalus.  Major intracranial vascular structures are patent.  Degenerative changes C1-2 articulation with inferior position of the C1 ring and superior projection of the dens which when combined with transverse ligament hypertrophy causes contact with the cervical medullary junction and mild spinal stenosis. C3-4 cervical spondylotic changes with spinal stenosis and mild cord flattening.  Incidentally noted is a partially  empty non expanded sella.  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  Prominent small vessel disease type changes.  Global atrophy without hydrocephalus.  Degenerative changes C1-2 articulation with superior projection of the dens which causes contact with the cervical medullary junction and mild spinal stenosis. C3-4 cervical spondylotic changes with spinal stenosis and mild cord flattening.   Electronically Signed   By: Chauncey Cruel M.D.   On: 02/06/2014 13:06    Assessment: 80 year old female admitted with choledocholithiasis/cholangitis s/p ERCP with sphincterotomy, stone extraction, stent placement on 9/17, with worsening abdominal pain and CT revealing stent embedded in diverticulum. Stent unable to be repositioned with EGD. Urinary retention also noted from CT 9/19, with need for foley placement. Patient continues to improve with resolution of abdominal pain, N/V. Likely urinary retention playing a large role in prior symptoms. Physical exam benign, and LFTs continue to improve.  Repeat ERCP on hold due to acute neurological changes recently; as patient continues to improve, may hold on ERCP while inpatient and perform electively as an outpatient in 6 weeks for stent removal.    Plan: Continue Augmentin PPI BID Follow-up on pending path from gastric biopsy Dysphagia 2 diet   Orvil Feil, ANP-BC [facility]    LOS: 6 days    02/07/2014, 7:47 AM

## 2014-02-07 NOTE — Evaluation (Signed)
Physical Therapy Evaluation Patient Details Name: [name] MRN: [id number] DOB: [birth year] Today's Date: 02/07/2014   History of Present Illness  [name] is an 80 y.o. female sent in from [facility] for abdominal pain.  She doesn't respond to me, so history was taken from record and EDP and staff.  She was more alert and was able to converse with the RN, but she only opened her eye when I saw her.  She did not have nausea, or vomiting, and did have low grade temperature.  She did not have any black or bloody stool.  Evalutuion in the ER included a CT of the abd./pelvis which showed dilated intra and extra hepatic ducts, with periportal edema.  She also has distal common bile duct stone with CBD dilated to 38m. She has had CCY.  Her LFTS were grossly elevated with AST of 1058, ASL 349 and AlK 549, with total bili of 1.6, mostly direct.  Her Cr was 1.02, and she has normal electrolytes.  Her Lipase was normal.  Her WBC was elevated to 14K, and her temperature in the ER was 100.8.  Her UA showed no infection, and her CXR was negative.  Hopsitalist was asked to admit her for further evaluation and Tx.    Clinical Impression   Pt was seen for evaluation and found to be mildly deconditioned from her baseline.  She was very cooperative and anxious to get out of bed.  She did require min assist for transfers and gait with a walker, but she was stable.  I think that she is strong enough to be able to return to [facility] at d/c.    Follow Up Recommendations Home health PT    Equipment Recommendations  None recommended by PT    Recommendations for Other Services   none    Precautions / Restrictions Precautions Precautions: Fall Restrictions Weight Bearing Restrictions: No      Mobility  Bed Mobility Overal bed mobility: Needs Assistance Bed Mobility: Supine to Sit     Supine to sit: Min assist     General bed mobility comments: with HOB at 30 degrees  Transfers Overall  transfer level: Needs assistance Equipment used: Rolling walker (2 wheeled) Transfers: Sit to/from Stand Sit to Stand: Min assist            Ambulation/Gait Ambulation/Gait assistance: Min assist Ambulation Distance (Feet): 15 Feet Assistive device: Rolling walker (2 wheeled) Gait Pattern/deviations: Decreased step length - right;Decreased step length - left;Trunk flexed   Gait velocity interpretation: Below normal speed for age/gender General Gait Details: pt tends to hold walker too far forward and needs frequent cues for proper positioning  Stairs            Wheelchair Mobility    Modified Rankin (Stroke Patients Only)       Balance Overall balance assessment: No apparent balance deficits (not formally assessed)                                           Pertinent Vitals/Pain Pain Assessment: No/denies pain    Home Living Family/patient expects to be discharged to:: Assisted living               Home Equipment: Walker - 2 wheels      Prior Function Level of Independence: Needs assistance   Gait / Transfers Assistance Needed: pt needs  assis to ambulate with a walker, transfer assist is unknown  ADL's / Homemaking Assistance Needed: assist needed wth all ADLs        Hand Dominance        Extremity/Trunk Assessment   Upper Extremity Assessment: Defer to OT evaluation           Lower Extremity Assessment: Generalized weakness      Cervical / Trunk Assessment: Kyphotic  Communication   Communication: No difficulties  Cognition Arousal/Alertness: Awake/alert Behavior During Therapy: WFL for tasks assessed/performed Overall Cognitive Status: Within Functional Limits for tasks assessed                      General Comments      Exercises General Exercises - Lower Extremity Ankle Circles/Pumps: AROM;Both;10 reps;Supine Heel Slides: Both;5 reps;Supine;AAROM Hip ABduction/ADduction: Both;AAROM;5  reps;Supine      Assessment/Plan    PT Assessment Patient needs continued PT services  PT Diagnosis Difficulty walking;Generalized weakness   PT Problem List Decreased strength;Decreased activity tolerance;Decreased mobility  PT Treatment Interventions Gait training;Functional mobility training;Therapeutic exercise   PT Goals (Current goals can be found in the Care Plan section) Acute Rehab PT Goals Patient Stated Goal: none stated PT Goal Formulation: With patient Time For Goal Achievement: 02/21/14 Potential to Achieve Goals: Good    Frequency Min 3X/week   Barriers to discharge        Co-evaluation               End of Session Equipment Utilized During Treatment: Gait belt Activity Tolerance: Patient tolerated treatment well Patient left: in chair;with call bell/phone within reach;with chair alarm set Nurse Communication: Mobility status         Time: 8177-1165 PT Time Calculation (min): 40 min   Charges:   PT Evaluation $Initial PT Evaluation Tier I: 1 Procedure     PT G CodesDemetrios Isaacs L 02/07/2014, 11:15 AM

## 2014-02-07 NOTE — Progress Notes (Signed)
Call received from Dr. Oneida Alar. Procedure cancelled. Discontinued NPO order and placed order for Dysphagia 2 diet.

## 2014-02-07 NOTE — Progress Notes (Signed)
REVIEWED. DISCUSSED ANESTHESIA RISK WITH DR. GONZALEZ. RECOMMENDED IF PT NEEDED REPEAT ERCP THAT PT HAVE IT PERFORMED AT AN INSTITUTION THAT HAS A NEUROLOGIST AND IR AVAILABLE. PT CLINICALLY STABLE. NO SIGNS OR SYMPTOMS OF PERORATION.

## 2014-02-07 NOTE — OR Nursing (Signed)
Procedure cancelled by Dr. Patsey Berthold and Dr. Oneida Alar.  Change in health condition

## 2014-02-07 NOTE — Progress Notes (Signed)
Patient ID: [name], female   DOB: May 23, 1912, 80 y.o.   MRN: [id number]   [name] A. Merlene Laughter, MD     [email or web address]          [name] is an 80 y.o. female.   Assessment/Plan: 1. Single episode of unresponsiveness that improves spontaneously. The semiology seems most consistent with a unwitnessed seizure. There are no indications that she has increased risk of having a recurrent events. 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 It is doubtful that this is the etiology of the patient unresponsiveness and likely is incidental. She has been placed on aspirin for secondary stroke prevention. An EEG FINE. An echocardiography fine. A carotid duplex Doppler will also be obtained although any abnormalities on the scan will be considered asymptomatic given that she has a posterior circulation infarct.    No new complaints are reported. She reports not feeling herself but otherwise is doing okay.  GENERAL: This is a pleasant lady who is in no acute distress.  HEENT: Supple. Atraumatic normocephalic.  ABDOMEN: soft  EXTREMITIES: No edema. There is marked arthritic changes of the knees.  BACK: Normal.  SKIN: Normal by inspection.  MENTAL STATUS: She is currently awake and alert. She knows she is in the hospital in [place]. She tells me that his 2015 but not oriented to the month.  MOTOR: She has 4+5 strength in the upper extremities. Bulk and tone are normal. She has significant leg weakness graded as 3/5.  COORDINATION: Left finger to nose is normal, right finger to nose is normal, No rest tremor; no intention tremor; no postural tremor; no bradykinesia.       Objective: Vital signs in last 24 hours: Temp:  [97.9 F (36.6 C)-98.6 F (37 C)] 98.4 F (36.9 C) (09/22 1555) Pulse Rate:  [68-80] 77 (09/22 1555) Resp:  [16-20] 20 (09/22 1555) BP: (118-135)/(45-64) 125/64 mmHg (09/22 1555) SpO2:  [100 %]  100 % (09/22 1555)  Intake/Output from previous day: 09/21 0701 - 09/22 0700 In: 120 [P.O.:120] Out: 1250 [Urine:1250] Intake/Output this shift:   Nutritional status: Dysphagia   Lab Results: Results for orders placed during the hospital encounter of 02/01/14 (from the past 48 hour(s))  GLUCOSE, CAPILLARY     Status: None   Collection Time    02/05/14  6:12 PM      Result Value Ref Range   Glucose-Capillary 95  70 - 99 mg/dL  GLUCOSE, CAPILLARY     Status: None   Collection Time    02/05/14 11:40 PM      Result Value Ref Range   Glucose-Capillary 96  70 - 99 mg/dL  GLUCOSE, CAPILLARY     Status: Abnormal   Collection Time    02/06/14  6:39 AM      Result Value Ref Range   Glucose-Capillary 103 (*) 70 - 99 mg/dL  TROPONIN I     Status: None   Collection Time    02/06/14  7:20 AM      Result Value Ref Range   Troponin I <0.30  <0.30 ng/mL   Comment:            Due to the release kinetics of cTnI,     a negative result within the first hours     of the onset of symptoms does not rule out     myocardial infarction with certainty.     If myocardial infarction is still  suspected,     repeat the test at appropriate intervals.  COMPREHENSIVE METABOLIC PANEL     Status: Abnormal   Collection Time    02/06/14  7:20 AM      Result Value Ref Range   Sodium 138  137 - 147 mEq/L   Potassium 3.8  3.7 - 5.3 mEq/L   Chloride 101  96 - 112 mEq/L   CO2 27  19 - 32 mEq/L   Glucose, Bld 84  70 - 99 mg/dL   BUN 19  6 - 23 mg/dL   Creatinine, Ser 0.97  0.50 - 1.10 mg/dL   Calcium 9.0  8.4 - 10.5 mg/dL   Total Protein 5.9 (*) 6.0 - 8.3 g/dL   Albumin 2.5 (*) 3.5 - 5.2 g/dL   AST 19  0 - 37 U/L   ALT 45 (*) 0 - 35 U/L   Alkaline Phosphatase 222 (*) 39 - 117 U/L   Total Bilirubin 0.6  0.3 - 1.2 mg/dL   GFR calc non Af Amer 47 (*) >90 mL/min   GFR calc Af Amer 55 (*) >90 mL/min   Comment: (NOTE)     The eGFR has been calculated using the CKD EPI equation.     This calculation has  not been validated in all clinical situations.     eGFR's persistently <90 mL/min signify possible Chronic Kidney     Disease.   Anion gap 10  5 - 15  CBC WITH DIFFERENTIAL     Status: Abnormal   Collection Time    02/06/14  7:20 AM      Result Value Ref Range   WBC 10.0  4.0 - 10.5 K/uL   RBC 3.70 (*) 3.87 - 5.11 MIL/uL   Hemoglobin 12.0  12.0 - 15.0 g/dL   HCT 34.9 (*) 36.0 - 46.0 %   MCV 94.3  78.0 - 100.0 fL   MCH 32.4  26.0 - 34.0 pg   MCHC 34.4  30.0 - 36.0 g/dL   RDW 13.7  11.5 - 15.5 %   Platelets 199  150 - 400 K/uL   Neutrophils Relative % 75  43 - 77 %   Neutro Abs 7.5  1.7 - 7.7 K/uL   Lymphocytes Relative 12  12 - 46 %   Lymphs Abs 1.2  0.7 - 4.0 K/uL   Monocytes Relative 11  3 - 12 %   Monocytes Absolute 1.1 (*) 0.1 - 1.0 K/uL   Eosinophils Relative 2  0 - 5 %   Eosinophils Absolute 0.2  0.0 - 0.7 K/uL   Basophils Relative 0  0 - 1 %   Basophils Absolute 0.0  0.0 - 0.1 K/uL  LACTIC ACID, PLASMA     Status: None   Collection Time    02/06/14  7:20 AM      Result Value Ref Range   Lactic Acid, Venous 0.8  0.5 - 2.2 mmol/L  BLOOD GAS, ARTERIAL     Status: Abnormal   Collection Time    02/06/14  7:35 AM      Result Value Ref Range   FIO2 0.21     O2 Content 21.0     Delivery systems ROOM AIR     pH, Arterial 7.420  7.350 - 7.450   pCO2 arterial 40.9  35.0 - 45.0 mmHg   pO2, Arterial 67.6 (*) 80.0 - 100.0 mmHg   Bicarbonate 26.0 (*) 20.0 - 24.0 mEq/L   TCO2 23.5  0 -  100 mmol/L   Acid-Base Excess 2.0  0.0 - 2.0 mmol/L   O2 Saturation 90.3     Patient temperature 37.0     Collection site LEFT RADIAL     Drawn by 635     Comment: CORRECTED ON 09/21 AT 1058: PREVIOUSLY REPORTED AS 2354   Sample type ARTERIAL     Allens test (pass/fail) PASS  PASS  GLUCOSE, CAPILLARY     Status: None   Collection Time    02/06/14 11:48 AM      Result Value Ref Range   Glucose-Capillary 78  70 - 99 mg/dL   Comment 1 Notify RN    GLUCOSE, CAPILLARY     Status: Abnormal    Collection Time    02/06/14  1:35 PM      Result Value Ref Range   Glucose-Capillary 69 (*) 70 - 99 mg/dL   Comment 1 Notify RN    GLUCOSE, CAPILLARY     Status: Abnormal   Collection Time    02/06/14  2:16 PM      Result Value Ref Range   Glucose-Capillary 113 (*) 70 - 99 mg/dL   Comment 1 Documented in Chart     Comment 2 Notify RN    PROTIME-INR     Status: Abnormal   Collection Time    02/06/14  3:19 PM      Result Value Ref Range   Prothrombin Time 26.0 (*) 11.6 - 15.2 seconds   INR 2.38 (*) 0.00 - 1.49  APTT     Status: Abnormal   Collection Time    02/06/14  3:19 PM      Result Value Ref Range   aPTT 177 (*) 24 - 37 seconds   Comment: CORRECTED RESULTS CALLED TO:     HAWKINS,M. AT 1704 ON 02/06/2014 BY BAUGHAM,M.                IF BASELINE aPTT IS ELEVATED,     SUGGEST PATIENT RISK ASSESSMENT     BE USED TO DETERMINE APPROPRIATE     ANTICOAGULANT THERAPY.     CORRECTED ON 09/21 AT 1703: PREVIOUSLY REPORTED AS 57        IF BASELINE aPTT IS ELEVATED, SUGGEST PATIENT RISK ASSESSMENT BE USED TO DETERMINE APPROPRIATE ANTICOAGULANT THERAPY.  GLUCOSE, CAPILLARY     Status: None   Collection Time    02/06/14  6:10 PM      Result Value Ref Range   Glucose-Capillary 86  70 - 99 mg/dL   Comment 1 Notify RN    HEMOGLOBIN A1C     Status: Abnormal   Collection Time    02/06/14  7:53 PM      Result Value Ref Range   Hemoglobin A1C 6.1 (*) <5.7 %   Comment: (NOTE)                                                                               According to the ADA Clinical Practice Recommendations for 2011, when     HbA1c is used as a screening test:      >=6.5%   Diagnostic of Diabetes Mellitus               (  if abnormal result is confirmed)     5.7-6.4%   Increased risk of developing Diabetes Mellitus     References:Diagnosis and Classification of Diabetes Mellitus,Diabetes     MCNO,7096,28(ZMOQH 1):S62-S69 and Standards of Medical Care in             Diabetes - 2011,Diabetes  UTML,4650,35 (Suppl 1):S11-S61.   Mean Plasma Glucose 128 (*) <117 mg/dL   Comment: Performed at [facility], CAP[facility]     Status: Abnormal   Collection Time    02/06/14  9:42 PM      Result Value Ref Range   Glucose-Capillary 135 (*) 70 - 99 mg/dL   Comment 1 Notify RN     Comment 2 Documented in Chart    GLUCOSE, CAPILLARY     Status: Abnormal   Collection Time    02/06/14 11:44 PM      Result Value Ref Range   Glucose-Capillary 145 (*) 70 - 99 mg/dL   Comment 1 Notify RN    LIPID PANEL     Status: Abnormal   Collection Time    02/07/14  6:23 AM      Result Value Ref Range   Cholesterol 124  0 - 200 mg/dL   Triglycerides 145  <150 mg/dL   HDL 25 (*) >39 mg/dL   Total CHOL/HDL Ratio 5.0     VLDL 29  0 - 40 mg/dL   LDL Cholesterol 70  0 - 99 mg/dL   Comment:            Total Cholesterol/HDL:CHD Risk     Coronary Heart Disease Risk Table                         Men   Women      1/2 Average Risk   3.4   3.3      Average Risk       5.0   4.4      2 X Average Risk   9.6   7.1      3 X Average Risk  23.4   11.0                Use the calculated Patient Ratio     above and the CHD Risk Table     to determine the patient's CHD Risk.                ATP III CLASSIFICATION (LDL):      <100     mg/dL   Optimal      100-129  mg/dL   Near or Above                        Optimal      130-159  mg/dL   Borderline      160-189  mg/dL   High      >190     mg/dL   Very High  GLUCOSE, CAPILLARY     Status: Abnormal   Collection Time    02/07/14  6:45 AM      Result Value Ref Range   Glucose-Capillary 127 (*) 70 - 99 mg/dL   Comment 1 Notify RN    GLUCOSE, CAPILLARY     Status: Abnormal   Collection Time    02/07/14  8:23 AM      Result Value Ref Range   Glucose-Capillary 112 (*) 70 - 99 mg/dL  GLUCOSE, CAPILLARY     Status: Abnormal   Collection Time    02/07/14 11:35 AM      Result Value Ref Range   Glucose-Capillary 143 (*) 70 - 99 mg/dL  GLUCOSE,  CAPILLARY     Status: Abnormal   Collection Time    02/07/14  4:37 PM      Result Value Ref Range   Glucose-Capillary 109 (*) 70 - 99 mg/dL   Comment 1 Notify RN     Comment 2 Documented in Chart      Lipid Panel  Recent Labs  02/07/14 0623  CHOL 124  TRIG 145  HDL 25*  CHOLHDL 5.0  VLDL 29  LDLCALC 70    Studies/Results:  TTE - Left ventricle: The cavity size was normal. There was focal basal hypertrophy. Systolic function was vigorous. The estimated ejection fraction was in the range of 65% to 70%. Doppler parameters are consistent with abnormal left ventricular relaxation (grade 1 diastolic dysfunction). - Aortic valve: Mildly calcified annulus. Mildly thickened leaflets. There was moderate regurgitation. Valve area (VTI): 1.78 cm^2. Regurgitation pressure half-time: 332 ms. - Mitral valve: Moderately calcified annulus. Mildly thickened leaflets . There is mild mitral stenosis by mean gradient, normal valve area by pressure half time. MVA by pressure half time 2.8 cm squared. - Atrial septum: No defect or patent foramen ovale was identified. - Pulmonary arteries: Systolic pressure was moderately increased. PA peak pressure: 50 mm Hg (S). - Inferior vena cava: The vessel was normal in size. The respirophasic diameter changes were in the normal range (>= 50%), consistent with normal central venous pressure. - Technically difficult study   EEG FINE   Medications:  Scheduled Meds: . amoxicillin-clavulanate  1 tablet Oral BID  . aspirin  300 mg Rectal Daily   Or  . aspirin  325 mg Oral Daily  . calcium-vitamin D  1 tablet Oral Q breakfast  . darifenacin  7.5 mg Oral Daily  . docusate sodium  100 mg Oral BID  . feeding supplement (ENSURE COMPLETE)  237 mL Oral TID WC & HS  . insulin aspart  0-9 Units Subcutaneous Q6H  . levothyroxine  25 mcg Oral QAC breakfast  . montelukast  10 mg Oral q morning - 10a  . pantoprazole  40 mg Oral BID AC  . sodium chloride   3 mL Intravenous Q12H   Continuous Infusions:  PRN Meds:.acetaminophen, ondansetron (ZOFRAN) IV, ondansetron, zolpidem     LOS: 6 days   Aaminah Forrester A. Merlene [name], M.D.  Diplomate, Tax adviser of Psychiatry and Neurology ( Neurology).

## 2014-02-07 NOTE — Procedures (Signed)
  [place] A. Merlene Laughter, MD     [email or web address]           HISTORY: This is a 80 year old female who has had an episode of unresponsiveness suspicious for unwitnessed seizure.  MEDICATIONS: Scheduled Meds: . amoxicillin-clavulanate  1 tablet Oral BID  . aspirin  300 mg Rectal Daily   Or  . aspirin  325 mg Oral Daily  . calcium-vitamin D  1 tablet Oral Q breakfast  . darifenacin  7.5 mg Oral Daily  . docusate sodium  100 mg Oral BID  . feeding supplement (ENSURE COMPLETE)  237 mL Oral TID WC & HS  . insulin aspart  0-9 Units Subcutaneous Q6H  . levothyroxine  25 mcg Oral QAC breakfast  . montelukast  10 mg Oral q morning - 10a  . pantoprazole  40 mg Oral BID AC  . sodium chloride  3 mL Intravenous Q12H   Continuous Infusions:  PRN Meds:.acetaminophen, ondansetron (ZOFRAN) IV, ondansetron, zolpidem  Prior to Admission medications   Medication Sig Start Date End Date Taking? Authorizing Provider  Calcium Citrate-Vitamin D 200-250 MG-UNIT TABS Take 1 tablet by mouth daily.   Yes Historical Provider, MD  docusate sodium (COLACE) 100 MG capsule Take 1 capsule (100 mg total) by mouth every 12 (twelve) hours. 03/02/13  Yes Johnna Acosta, MD  ferrous sulfate 325 (65 FE) MG tablet Take 325 mg by mouth daily with breakfast.   Yes Historical Provider, MD  furosemide (LASIX) 40 MG tablet Take 40 mg by mouth daily.    Yes Historical Provider, MD  HYDROcodone-acetaminophen (NORCO/VICODIN) 5-325 MG per tablet Take 1 tablet by mouth every 6 (six) hours as needed for moderate pain.   Yes Historical Provider, MD  levothyroxine (SYNTHROID, LEVOTHROID) 25 MCG tablet Take 25 mcg by mouth daily before breakfast.   Yes Historical Provider, MD  montelukast (SINGULAIR) 10 MG tablet Take 10 mg by mouth every morning.    Yes Historical Provider, MD  Multiple Vitamin (DAILY VITAMIN PO) Take 1 tablet by mouth daily.   Yes Historical Provider, MD  pantoprazole (PROTONIX) 40 MG tablet  Take 40 mg by mouth daily.    Yes Historical Provider, MD  solifenacin (VESICARE) 5 MG tablet Take 5 mg by mouth daily.   Yes Historical Provider, MD  theophylline (THEODUR) 200 MG 12 hr tablet Take 200 mg by mouth 2 (two) times daily.    Yes Historical Provider, MD  zolpidem (AMBIEN) 5 MG tablet Take 5 mg by mouth at bedtime as needed for sleep.    Yes Historical Provider, MD  amoxicillin-clavulanate (AUGMENTIN) 500-125 MG per tablet Take 1 tablet (500 mg total) by mouth 2 (two) times daily. 02/04/14   Samuella Cota, MD      ANALYSIS: A 16 channel recording using standard 10 20 measurements is conducted for 22 minutes. There is a posterior dominant rhythm of 9 Hz which attenuates with eye opening. There is beta activity is observed in the frontal areas. Awake and sleep activities are observed. K complexes and spindles are seen. Photic stimulation and hyperventilation were not carried out. There are no focal or lateralized slowing. There is no epileptiform activity observed.   IMPRESSION: 1. This is a normal recording awake and sleep states.      Alisse Tuite A. Merlene Laughter, M.D.  Diplomate, Tax adviser of Psychiatry and Neurology ( Neurology).

## 2014-02-08 ENCOUNTER — Other Ambulatory Visit: Payer: Self-pay

## 2014-02-08 ENCOUNTER — Telehealth: Payer: Self-pay | Admitting: Gastroenterology

## 2014-02-08 DIAGNOSIS — R9389 Abnormal findings on diagnostic imaging of other specified body structures: Secondary | ICD-10-CM

## 2014-02-08 DIAGNOSIS — I6350 Cerebral infarction due to unspecified occlusion or stenosis of unspecified cerebral artery: Secondary | ICD-10-CM | POA: Insufficient documentation

## 2014-02-08 LAB — CBC
HCT: 32.5 % — ABNORMAL LOW (ref 36.0–46.0)
Hemoglobin: 11 g/dL — ABNORMAL LOW (ref 12.0–15.0)
MCH: 32.6 pg (ref 26.0–34.0)
MCHC: 33.8 g/dL (ref 30.0–36.0)
MCV: 96.4 fL (ref 78.0–100.0)
PLATELETS: 197 10*3/uL (ref 150–400)
RBC: 3.37 MIL/uL — ABNORMAL LOW (ref 3.87–5.11)
RDW: 13.8 % (ref 11.5–15.5)
WBC: 9.4 10*3/uL (ref 4.0–10.5)

## 2014-02-08 LAB — GLUCOSE, CAPILLARY
Glucose-Capillary: 114 mg/dL — ABNORMAL HIGH (ref 70–99)
Glucose-Capillary: 131 mg/dL — ABNORMAL HIGH (ref 70–99)
Glucose-Capillary: 156 mg/dL — ABNORMAL HIGH (ref 70–99)
Glucose-Capillary: 163 mg/dL — ABNORMAL HIGH (ref 70–99)
Glucose-Capillary: 166 mg/dL — ABNORMAL HIGH (ref 70–99)
Glucose-Capillary: 87 mg/dL (ref 70–99)

## 2014-02-08 LAB — BASIC METABOLIC PANEL
ANION GAP: 8 (ref 5–15)
BUN: 23 mg/dL (ref 6–23)
CALCIUM: 8.8 mg/dL (ref 8.4–10.5)
CO2: 30 mEq/L (ref 19–32)
CREATININE: 0.83 mg/dL (ref 0.50–1.10)
Chloride: 104 mEq/L (ref 96–112)
GFR calc non Af Amer: 57 mL/min — ABNORMAL LOW (ref >90)
GFR, EST AFRICAN AMERICAN: 66 mL/min — AB (ref >90)
Glucose, Bld: 88 mg/dL (ref 70–99)
Potassium: 4.3 mEq/L (ref 3.7–5.3)
Sodium: 142 mEq/L (ref 137–147)

## 2014-02-08 LAB — HEPATIC FUNCTION PANEL
ALK PHOS: 157 U/L — AB (ref 39–117)
ALT: 24 U/L (ref 0–35)
AST: 14 U/L (ref 0–37)
Albumin: 2.3 g/dL — ABNORMAL LOW (ref 3.5–5.2)
TOTAL PROTEIN: 5.5 g/dL — AB (ref 6.0–8.3)
Total Bilirubin: 0.3 mg/dL (ref 0.3–1.2)

## 2014-02-08 MED ORDER — ASPIRIN 325 MG PO TABS: 325.0000 mg | ORAL_TABLET | Freq: Every day | ORAL | Status: DC

## 2014-02-08 MED ORDER — HYDROCODONE-ACETAMINOPHEN 5-325 MG PO TABS: 1.0000 | ORAL_TABLET | Freq: Four times a day (QID) | ORAL | Status: DC | PRN

## 2014-02-08 MED ORDER — ENSURE COMPLETE PO LIQD: 237.0000 mL | Freq: Three times a day (TID) | ORAL | Status: DC

## 2014-02-08 MED ORDER — LEVETIRACETAM 250 MG PO TABS
250.0000 mg | ORAL_TABLET | Freq: Every day | ORAL | Status: DC
  Administered 2014-02-08 – 2014-02-09 (×2): 250 mg via ORAL
  Filled 2014-02-08 (×2): qty 1

## 2014-02-08 MED ORDER — TAMSULOSIN HCL 0.4 MG PO CAPS: 0.4000 mg | ORAL_CAPSULE | Freq: Every day | ORAL | Status: AC

## 2014-02-08 MED ORDER — TAMSULOSIN HCL 0.4 MG PO CAPS
0.4000 mg | ORAL_CAPSULE | Freq: Every day | ORAL | Status: DC
  Administered 2014-02-08 – 2014-02-09 (×2): 0.4 mg via ORAL
  Filled 2014-02-08 (×3): qty 1

## 2014-02-08 NOTE — Progress Notes (Signed)
Patient ID: [name], female   DOB: 1913-02-28, 80 y.o.   MRN: [id number]   [name] A. Merlene Laughter, MD     [email or web address]          [name] is an 80 y.o. female.   Assessment/Plan: 1. Single episode of unresponsiveness that improves spontaneously. The semiology seems most consistent with a unwitnessed seizure. There are no indications that she has increased risk of having a recurrent events. 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 It is doubtful that this is the etiology of the patient unresponsiveness and likely is incidental. She has been placed on aspirin for secondary stroke prevention. An EEG FINE. An echocardiography fine. A carotid duplex Doppler will also be obtained although any abnormalities on the scan will be considered asymptomatic given that she has a posterior circulation infarct.     Another episode of unresponsiveness. No witnessed tonic clonic events.   GENERAL: This is a pleasant lady who is in no acute distress.  HEENT: Supple. Atraumatic normocephalic.  ABDOMEN: soft  EXTREMITIES: No edema. There is marked arthritic changes of the knees.  BACK: Normal.  SKIN: Normal by inspection.  MENTAL STATUS: She is sleeping but easily arousable. Mildly dyspneic on awakening. She knows she is in the hospital in [place].  MOTOR: She has 4+5 strength in the upper extremities. Bulk and tone are normal. She has significant leg weakness graded as 3/5.  COORDINATION: Left finger to nose is normal, right finger to nose is normal, No rest tremor; no intention tremor; no postural tremor; no bradykinesia.       Objective: Vital signs in last 24 hours: Temp:  [98 F (36.7 C)-99.4 F (37.4 C)] 99.4 F (37.4 C) (09/23 1900) Pulse Rate:  [75-85] 85 (09/23 1900) Resp:  [16-18] 16 (09/23 1900) BP: (122-164)/(44-65) 129/54 mmHg (09/23 1900) SpO2:  [97 %-100 %] 98 % (09/23 1900)  Intake/Output  from previous day: 09/22 0701 - 09/23 0700 In: 240 [P.O.:240] Out: 650 [Urine:650] Intake/Output this shift:   Nutritional status: Dysphagia   Lab Results: Results for orders placed during the hospital encounter of 02/01/14 (from the past 48 hour(s))  HEMOGLOBIN A1C     Status: Abnormal   Collection Time    02/06/14  7:53 PM      Result Value Ref Range   Hemoglobin A1C 6.1 (*) <5.7 %   Comment: (NOTE)                                                                               According to the ADA Clinical Practice Recommendations for 2011, when     HbA1c is used as a screening test:      >=6.5%   Diagnostic of Diabetes Mellitus               (if abnormal result is confirmed)     5.7-6.4%   Increased risk of developing Diabetes Mellitus     References:Diagnosis and Classification of Diabetes Mellitus,Diabetes     RFXJ,8832,54(DIYME 1):S62-S69 and Standards of Medical Care in             Diabetes - 2011,Diabetes BRAX,0940,76 (Suppl  1):S11-S61.   Mean Plasma Glucose 128 (*) <117 mg/dL   Comment: Performed at [place], [facility]     Status: Abnormal   Collection Time    02/06/14  9:42 PM      Result Value Ref Range   Glucose-Capillary 135 (*) 70 - 99 mg/dL   Comment 1 Notify RN     Comment 2 Documented in Chart    GLUCOSE, CAPILLARY     Status: Abnormal   Collection Time    02/06/14 11:44 PM      Result Value Ref Range   Glucose-Capillary 145 (*) 70 - 99 mg/dL   Comment 1 Notify RN    LIPID PANEL     Status: Abnormal   Collection Time    02/07/14  6:23 AM      Result Value Ref Range   Cholesterol 124  0 - 200 mg/dL   Triglycerides 145  <150 mg/dL   HDL 25 (*) >39 mg/dL   Total CHOL/HDL Ratio 5.0     VLDL 29  0 - 40 mg/dL   LDL Cholesterol 70  0 - 99 mg/dL   Comment:            Total Cholesterol/HDL:CHD Risk     Coronary Heart Disease Risk Table                         Men   Women      1/2 Average Risk   3.4   3.3      Average Risk       5.0    4.4      2 X Average Risk   9.6   7.1      3 X Average Risk  23.4   11.0                Use the calculated Patient Ratio     above and the CHD Risk Table     to determine the patient's CHD Risk.                ATP III CLASSIFICATION (LDL):      <100     mg/dL   Optimal      100-129  mg/dL   Near or Above                        Optimal      130-159  mg/dL   Borderline      160-189  mg/dL   High      >190     mg/dL   Very High  GLUCOSE, CAPILLARY     Status: Abnormal   Collection Time    02/07/14  6:45 AM      Result Value Ref Range   Glucose-Capillary 127 (*) 70 - 99 mg/dL   Comment 1 Notify RN    GLUCOSE, CAPILLARY     Status: Abnormal   Collection Time    02/07/14  8:23 AM      Result Value Ref Range   Glucose-Capillary 112 (*) 70 - 99 mg/dL  GLUCOSE, CAPILLARY     Status: Abnormal   Collection Time    02/07/14 11:35 AM      Result Value Ref Range   Glucose-Capillary 143 (*) 70 - 99 mg/dL  GLUCOSE, CAPILLARY     Status: Abnormal   Collection Time    02/07/14  4:37 PM  Result Value Ref Range   Glucose-Capillary 109 (*) 70 - 99 mg/dL   Comment 1 Notify RN     Comment 2 Documented in Chart    GLUCOSE, CAPILLARY     Status: Abnormal   Collection Time    02/08/14 12:16 AM      Result Value Ref Range   Glucose-Capillary 156 (*) 70 - 99 mg/dL   Comment 1 Notify RN    CBC     Status: Abnormal   Collection Time    02/08/14  5:47 AM      Result Value Ref Range   WBC 9.4  4.0 - 10.5 K/uL   RBC 3.37 (*) 3.87 - 5.11 MIL/uL   Hemoglobin 11.0 (*) 12.0 - 15.0 g/dL   HCT 32.5 (*) 36.0 - 46.0 %   MCV 96.4  78.0 - 100.0 fL   MCH 32.6  26.0 - 34.0 pg   MCHC 33.8  30.0 - 36.0 g/dL   RDW 13.8  11.5 - 15.5 %   Platelets 197  150 - 400 K/uL  BASIC METABOLIC PANEL     Status: Abnormal   Collection Time    02/08/14  5:47 AM      Result Value Ref Range   Sodium 142  137 - 147 mEq/L   Potassium 4.3  3.7 - 5.3 mEq/L   Chloride 104  96 - 112 mEq/L   CO2 30  19 - 32 mEq/L    Glucose, Bld 88  70 - 99 mg/dL   BUN 23  6 - 23 mg/dL   Creatinine, Ser 0.83  0.50 - 1.10 mg/dL   Calcium 8.8  8.4 - 10.5 mg/dL   GFR calc non Af Amer 57 (*) >90 mL/min   GFR calc Af Amer 66 (*) >90 mL/min   Comment: (NOTE)     The eGFR has been calculated using the CKD EPI equation.     This calculation has not been validated in all clinical situations.     eGFR's persistently <90 mL/min signify possible Chronic Kidney     Disease.   Anion gap 8  5 - 15  HEPATIC FUNCTION PANEL     Status: Abnormal   Collection Time    02/08/14  5:47 AM      Result Value Ref Range   Total Protein 5.5 (*) 6.0 - 8.3 g/dL   Albumin 2.3 (*) 3.5 - 5.2 g/dL   AST 14  0 - 37 U/L   ALT 24  0 - 35 U/L   Alkaline Phosphatase 157 (*) 39 - 117 U/L   Total Bilirubin 0.3  0.3 - 1.2 mg/dL   Bilirubin, Direct <0.2  0.0 - 0.3 mg/dL   Indirect Bilirubin NOT CALCULATED  0.3 - 0.9 mg/dL  GLUCOSE, CAPILLARY     Status: None   Collection Time    02/08/14  6:06 AM      Result Value Ref Range   Glucose-Capillary 87  70 - 99 mg/dL  GLUCOSE, CAPILLARY     Status: Abnormal   Collection Time    02/08/14 11:10 AM      Result Value Ref Range   Glucose-Capillary 114 (*) 70 - 99 mg/dL   Comment 1 Notify RN    GLUCOSE, CAPILLARY     Status: Abnormal   Collection Time    02/08/14  3:13 PM      Result Value Ref Range   Glucose-Capillary 163 (*) 70 - 99 mg/dL  GLUCOSE, CAPILLARY  Status: Abnormal   Collection Time    02/08/14  4:41 PM      Result Value Ref Range   Glucose-Capillary 166 (*) 70 - 99 mg/dL   Comment 1 Notify RN     Comment 2 Documented in Chart      Lipid Panel  Recent Labs  02/07/14 0623  CHOL 124  TRIG 145  HDL 25*  CHOLHDL 5.0  VLDL 29  LDLCALC 70    Studies/Results:  TTE - Left ventricle: The cavity size was normal. There was focal basal hypertrophy. Systolic function was vigorous. The estimated ejection fraction was in the range of 65% to 70%. Doppler parameters are consistent  with abnormal left ventricular relaxation (grade 1 diastolic dysfunction). - Aortic valve: Mildly calcified annulus. Mildly thickened leaflets. There was moderate regurgitation. Valve area (VTI): 1.78 cm^2. Regurgitation pressure half-time: 332 ms. - Mitral valve: Moderately calcified annulus. Mildly thickened leaflets . There is mild mitral stenosis by mean gradient, normal valve area by pressure half time. MVA by pressure half time 2.8 cm squared. - Atrial septum: No defect or patent foramen ovale was identified. - Pulmonary arteries: Systolic pressure was moderately increased. PA peak pressure: 50 mm Hg (S). - Inferior vena cava: The vessel was normal in size. The respirophasic diameter changes were in the normal range (>= 50%), consistent with normal central venous pressure. - Technically difficult study   EEG FINE   Medications:  Scheduled Meds: . amoxicillin-clavulanate  1 tablet Oral BID  . aspirin  300 mg Rectal Daily   Or  . aspirin  325 mg Oral Daily  . calcium-vitamin D  1 tablet Oral Q breakfast  . darifenacin  7.5 mg Oral Daily  . docusate sodium  100 mg Oral BID  . feeding supplement (ENSURE COMPLETE)  237 mL Oral TID WC & HS  . insulin aspart  0-9 Units Subcutaneous Q6H  . levETIRAcetam  250 mg Oral Daily  . levothyroxine  25 mcg Oral QAC breakfast  . montelukast  10 mg Oral q morning - 10a  . pantoprazole  40 mg Oral BID AC  . sodium chloride  3 mL Intravenous Q12H  . tamsulosin  0.4 mg Oral Daily   Continuous Infusions:  PRN Meds:.acetaminophen, ondansetron (ZOFRAN) IV, ondansetron, zolpidem     LOS: 7 days   Kenzie Thoreson A. Merlene [name], M.D.  Diplomate, Tax adviser of Psychiatry and Neurology ( Neurology).

## 2014-02-08 NOTE — Progress Notes (Signed)
Upon assessment, patient found unresponsive.  Patient woke with sternal rub but has slow responses.  Patient unable to squeeze hands but was able to move fingers slightly as well as toes.  Patient unable to speak.  Vitals stable. Blood sugar 163.  MD notified.  Will follow orders and continue to monitor patient.

## 2014-02-08 NOTE — Progress Notes (Signed)
[facility]                                                                              Patient Demographics  [name], is a 80 y.o. female, DOB - [birth year], TIR:[id number]  Admit date - 02/01/2014   Admitting Physician Orvan Falconer, MD  Outpatient Primary MD for the patient is [name], MD  LOS - 7   Chief Complaint  Patient presents with  . Abdominal Pain      HPI on 02/01/2014 GISSELA [name] is an 80 y.o. female sent in from [facility] for abdominal pain. She did not respond to the attending physician, so history was taken from record and EDP and staff. She was more alert and was able to converse with the RN.  She did not have nausea, or vomiting, but did have low grade temperature. She did not have any black or bloody stool. Evalutuion in the ER included a CT of the abd./pelvis which showed dilated intra and extra hepatic ducts, with periportal edema. She also has distal common bile duct stone with CBD dilated to 37m. She has had CCY. Her LFTS were grossly elevated with AST of 1058, ASL 349 and AlK 549, with total bili of 1.6, mostly direct. Her Cr was 1.02, and she has normal electrolytes. Her Lipase was normal. Her WBC was elevated to 14K, and her temperature in the ER was 100.8. Her UA showed no infection, and her CXR was negative. Hopsitalist was asked to admit her for further evaluation and Tx.    Assessment & Plan  Acute encephalopathy  -Secondary to unclear etiology at this time  -Neurology consulted and appreciated and recommended starting Keppra 250 mg daily. -EEG: normal recording awake and sleep states.  -Patient has no signs of infection  -Unlikely related to patient's acute CVA   Choledocholithiasis, suspected acute cholangitis  -Patient has had ERCP with biliary sphincterotomy and stone extraction with stent placement in the past  -Gastroenterology consulted and appreciated  -Imaging showed stent migration, it was unable to be relocated  with EGD  -Repeat ERCP was planned for this morning however canceled due to patient's current state  -Spoke with gastroenterology PA, patient may have an outpatient ERCP.  -Currently patient is stable  -Continue Augmentin (total course for 10 days) and PPI  -Continue dysphagia 2 diet  -Spoke with GI, [facility], this appointment will be arranged by GI [facility]   Acute CVA  -MRI head:Tiny acute nonhemorrhagic infarct inferior right cerebellum.  -LDL 70, HbA1c 6.1  -Neurology consulted and appreciated  -Echocardiogram: EF 667-61% Grade 1 diastolic dysfunction  -Continue aspirin for secondary prevention  -PT and OT consulted. OT had no further recommendations. Continue PT at the [facility].   Abdominal pain  -Appears to resolve this morning  -Imaging unrevealing   Urinary retention  -Foley was placed on 02/05/2014, will remove and dry voiding trial.  -Patient started on Flomax   Diabetes mellitus, type II  -Continue insulin sliding scale with CBG monitoring   History of COPD  -Appears to compensated   Protein  calorie malnutrition  -Continue feeding supplementation   Procedures:  EEG: normal recording awake and sleep states.   02/02/2014 ERCP   Echocardiogram  Study Conclusions - Left ventricle: The cavity size was normal. There was focal basal hypertrophy. Systolic function was vigorous. The estimated ejection fraction was in the range of 65% to 70%. Doppler parameters are consistent with abnormal left ventricular relaxation (grade 1 diastolic dysfunction). - Aortic valve: Mildly calcified annulus. Mildly thickened leaflets. There was moderate regurgitation. Valve area (VTI): 1.78 cm^2. Regurgitation pressure half-time: 332 ms.  - Mitral valve: Moderately calcified annulus. Mildly thickened leaflets . There is mild mitral stenosis by mean gradient, normal valve area  by pressure half time. MVA by pressure half time 2.8 cm squared. - Atrial septum: No defect or patent foramen ovale was identified. - Pulmonary arteries: Systolic pressure was moderately increased. PA peak pressure: 50 mm Hg (S). - Inferior vena cava: The vessel was normal in size. The respirophasic diameter changes were in the normal range (>= 50%), consistent with normal central venous pressure. - Technically difficult study.   Consultations:  Gastroenterology  Neurology  Code Status: Full  Family Communication: None at Bedside  Disposition Plan: Admitted, will likely discharge 9/24  Time Spent in minutes   30  minutes  DVT Prophylaxis  SCDs  Lab Results  Component Value Date   PLT 197 02/08/2014    Medications  Scheduled Meds: . amoxicillin-clavulanate  1 tablet Oral BID  . aspirin  300 mg Rectal Daily   Or  . aspirin  325 mg Oral Daily  . calcium-vitamin D  1 tablet Oral Q breakfast  . darifenacin  7.5 mg Oral Daily  . docusate sodium  100 mg Oral BID  . feeding supplement (ENSURE COMPLETE)  237 mL Oral TID WC & HS  . insulin aspart  0-9 Units Subcutaneous Q6H  . levothyroxine  25 mcg Oral QAC breakfast  . montelukast  10 mg Oral q morning - 10a  . pantoprazole  40 mg Oral BID AC  . sodium chloride  3 mL Intravenous Q12H  . tamsulosin  0.4 mg Oral Daily   Continuous Infusions:  PRN Meds:.acetaminophen, ondansetron (ZOFRAN) IV, ondansetron, zolpidem  Antibiotics    Anti-infectives   Start     Dose/Rate Route Frequency Ordered Stop   02/04/14 1000  amoxicillin-clavulanate (AUGMENTIN) 500-125 MG per tablet 500 mg     1 tablet Oral 2 times daily 02/03/14 1513     02/04/14 0000  amoxicillin-clavulanate (AUGMENTIN) 500-125 MG per tablet     1 tablet Oral 2 times daily 02/03/14 1514     02/03/14 1800  ampicillin-sulbactam (UNASYN) 1.5 g in sodium chloride 0.9 % 50 mL IVPB     1.5 g 100 mL/hr over 30 Minutes Intravenous Every 6 hours 02/03/14 1513 02/04/14 0110    02/02/14 0600  ampicillin-sulbactam (UNASYN) 1.5 g in sodium chloride 0.9 % 50 mL IVPB  Status:  Discontinued     1.5 g 100 mL/hr over 30 Minutes Intravenous Every 6 hours 02/02/14 0005 02/03/14 1513   02/02/14 0000  ampicillin-sulbactam (UNASYN) 1.5 g in sodium chloride 0.9 % 50 mL IVPB  Status:  Discontinued     1.5 g 100 mL/hr over 30 Minutes Intravenous Every 6 hours 02/01/14 2358 02/02/14 0005   02/01/14 2145  Ampicillin-Sulbactam (UNASYN) 3 g in sodium chloride 0.9 % 100 mL IVPB     3 g 100 mL/hr over 60 Minutes Intravenous  Once 02/01/14 2141 02/01/14 2320  Subjective:   Roshelle Traub seen and examined today.  Patient has no complaints this morning. She stated she had abdominal pain when she first came in however denies any abdominal pain, nausea, vomiting at this time.  Objective:   Filed Vitals:   02/08/14 0532 02/08/14 0900 02/08/14 1430 02/08/14 1514  BP: 122/59 164/54 147/47 127/44  Pulse: 76 81 75 79  Temp: 98.3 F (36.8 C) 98 F (36.7 C) 98 F (36.7 C)   TempSrc: Oral Oral Oral   Resp: _0 Height:      Weight:      SpO2: 100% 100% 98% 97%    Wt Readings from Last 3 Encounters:  02/04/14 48.353 kg (106 lb 9.6 oz)  02/04/14 48.353 kg (106 lb 9.6 oz)  02/04/14 48.353 kg (106 lb 9.6 oz)     Intake/Output Summary (Last 24 hours) at 02/08/14 1523 Last data filed at 02/08/14 1322  Gross per 24 hour  Intake    723 ml  Output   1050 ml  Net   -327 ml    Exam  General: Well developed, thin, elderly, NAD  HEENT: NCAT, mucous membranes moist.  Cardiovascular: S1 S2 auscultated, no rubs, murmurs or gallops. Regular rate and rhythm.  Respiratory: Clear to auscultation bilaterally with equal chest rise  Abdomen: Soft, nontender, nondistended, + bowel sounds  Extremities: warm dry without cyanosis clubbing or edema  Neuro: Awake and alert, and oriented to person and place, no focal deficits  Data Review   Micro Results Recent Results  (from the past 240 hour(s))  CULTURE, BLOOD (ROUTINE X 2)     Status: None   Collection Time    02/01/14  9:55 PM      Result Value Ref Range Status   Specimen Description BLOOD LEFT HAND   Final   Special Requests BOTTLES DRAWN AEROBIC AND ANAEROBIC 6CC   Final   Culture NO GROWTH 5 DAYS   Final   Report Status 02/06/2014 FINAL   Final  CULTURE, BLOOD (ROUTINE X 2)     Status: None   Collection Time    02/01/14  9:55 PM      Result Value Ref Range Status   Specimen Description BLOOD LEFT ANTECUBITAL   Final   Special Requests     Final   Value: BOTTLES DRAWN AEROBIC AND ANAEROBIC AEB=8CC ANA=4CC   Culture NO GROWTH 5 DAYS   Final   Report Status 02/06/2014 FINAL   Final  MRSA PCR SCREENING     Status: None   Collection Time    02/01/14 11:55 PM      Result Value Ref Range Status   MRSA by PCR NEGATIVE  NEGATIVE Final   Comment:            The GeneXpert MRSA Assay (FDA     approved for NASAL specimens     only), is one component of a     comprehensive MRSA colonization     surveillance program. It is not     intended to diagnose MRSA     infection nor to guide or     monitor treatment for     MRSA infections.    Radiology Reports Ct Abdomen Wo Contrast  02/06/2014   CLINICAL DATA:  Altered mental status. 80 year old with abdominal pain and low-grade fever. Admitted for cholangitis and choledocholithiasis.  EXAM: CT ABDOMEN WITHOUT CONTRAST  TECHNIQUE: Multidetector CT imaging of the abdomen was performed following the standard  protocol without IV contrast.  COMPARISON:  02/04/2014  FINDINGS: There are stable bilateral small pleural effusions, left side greater than right. No evidence for free air.  Again noted is diffuse pneumobilia consistent with prior biliary intervention. There continues to be a non metallic biliary stent in the distal common bile duct that extends into the duodenum. Adjacent to the proximal aspect of the stent, there appears to be a calcified stone on  sequence 2, image 23. This 1 cm stone is best visualized on sequence 3, image 33. Gallbladder has been removed. No acute abnormality to the liver. Spleen is small and unchanged. No acute abnormality involving the adrenal glands or pancreas. There are low-density structures in both kidneys which are difficult to evaluate on this exam but likely represent cortical cysts. Negative for hydronephrosis.  The abdominal aorta is heavily calcified without aneurysm. Calcifications along the periphery of the visualized uterus. There are multiple colonic diverticula in the left colon. No significant free fluid or lymphadenopathy within the abdomen. No acute abnormality involving the stomach. Multilevel disc and facet disease in the lumbar spine.  IMPRESSION: Stable position of the biliary stent. There appears to be a stone adjacent to the proximal aspect of the stent. Again noted is diffuse pneumobilia.  Diffuse colonic diverticulosis.  Stable bilateral pleural effusions, left side greater than right.   Electronically Signed   By: Markus Daft M.D.   On: 02/06/2014 08:09   Dg Chest 1 View  02/06/2014   CLINICAL DATA:  Altered mental status  EXAM: CHEST - 1 VIEW  COMPARISON:  02/01/2014  FINDINGS: Haziness of the lower chest which correlates with small pleural effusions and atelectasis on contemporaneous abdominal CT. Changes are more extensive on the left. No edema. Skin folds noted biapically; no pneumothorax suspected. Normal heart size and upper mediastinal contours for age. No acute osseous findings.  IMPRESSION: Small bilateral pleural effusion with atelectasis. No edema or pneumonia.   Electronically Signed   By: Jorje Guild M.D.   On: 02/06/2014 06:32   Ct Head Wo Contrast  02/06/2014   CLINICAL DATA:  Altered mental status.  EXAM: CT HEAD WITHOUT CONTRAST  TECHNIQUE: Contiguous axial images were obtained from the base of the skull through the vertex without intravenous contrast.  COMPARISON:  CT of the head  January 22, 2008  FINDINGS: The ventricles and sulci are normal for age. No intraparenchymal hemorrhage, mass effect nor midline shift. Patchy patchy to confluent supratentorial white matter hypodensities are within normal range for patient's age and though non-specific suggest sequelae of chronic small vessel ischemic disease. No acute large vascular territory infarcts.  No abnormal extra-axial fluid collections. Basal cisterns are patent. Severe calcific atherosclerosis of the carotid siphons.  No skull fracture. Patient is osteopenic. The included ocular globes and orbital contents are non-suspicious. Status post LEFT ocular lens implant. The mastoid aircells and included paranasal sinuses are well-aerated.  IMPRESSION: No acute intracranial process.  Involutional changes. Moderate white matter changes suggest chronic small vessel ischemic disease.   Electronically Signed   By: Elon Alas   On: 02/06/2014 06:28   Mr Brain Wo Contrast  02/06/2014   CLINICAL DATA:  80 year old female presenting with weakness and unresponsiveness. Diabetes and hypertension. Initial encounter.  EXAM: MRI HEAD WITHOUT CONTRAST  TECHNIQUE: Multiplanar, multiecho pulse sequences of the brain and surrounding structures were obtained without intravenous contrast.  COMPARISON:  02/06/2014 CT.  11/17/2003 MR.  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  No intracranial hemorrhage.  Prominent small vessel disease type changes.  No intracranial mass lesion noted on this unenhanced exam.  Global atrophy without hydrocephalus.  Major intracranial vascular structures are patent.  Degenerative changes C1-2 articulation with inferior position of the C1 ring and superior projection of the dens which when combined with transverse ligament hypertrophy causes contact with the cervical medullary junction and mild spinal stenosis. C3-4 cervical spondylotic changes with spinal stenosis and mild cord flattening.   Incidentally noted is a partially empty non expanded sella.  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  Prominent small vessel disease type changes.  Global atrophy without hydrocephalus.  Degenerative changes C1-2 articulation with superior projection of the dens which causes contact with the cervical medullary junction and mild spinal stenosis. C3-4 cervical spondylotic changes with spinal stenosis and mild cord flattening.   Electronically Signed   By: Chauncey Cruel M.D.   On: 02/06/2014 13:06   Ct Abdomen Pelvis W Contrast  02/04/2014   CLINICAL DATA:  Worsening abdominal pain after ERCP and sphincterotomy yesterday.  EXAM: CT ABDOMEN AND PELVIS WITH CONTRAST  TECHNIQUE: Multidetector CT imaging of the abdomen and pelvis was performed using the standard protocol following bolus administration of intravenous contrast.  CONTRAST:  173m OMNIPAQUE IOHEXOL 300 MG/ML IV. Oral contrast was not administered.  COMPARISON:  Multiple prior CT abdomen and pelvis: 02/01/2014, 12/14/2013, dating back to 11/17/2007.  FINDINGS: Endoscopic biliary stent with its proximal in the in the common bile duct and the distal end in what I believe is a small diverticulum arising from the descending duodenum laterally, as this was a contrast filled structure on the CT 3 days ago and is now filled with air. Gas within the common bile duct, common hepatic duct in the independent bile ducts in the liver, consistent with stent patency. Interval decrease in the caliber the common bile duct. Calcified granuloma in the anterior segment right lobe of liver at the dome; no significant focal hepatic parenchymal abnormalities. Gallbladder surgically absent.  Atrophic spleen and pancreas without focal parenchymal abnormalities involving either organ. Normal adrenal glands. Cortical cysts involving both kidneys; no significant abnormality involving either kidney. Extensive aortoiliofemoral atherosclerosis without  aneurysm or dissection. Visceral arteries atherosclerotic though patent; high-grade stenosis at the origin of the celiac artery with widely patent SMA and IMA origins. No significant lymphadenopathy.  Normal-appearing stomach filled with food. Normal-appearing small bowel. Extensive diffuse colonic diverticulosis without evidence of acute diverticulitis. No ascites.  Markedly distended urinary bladder. Atrophic uterus consistent with age. No adnexal masses or free pelvic fluid.  Bone window images demonstrate osseous demineralization, degenerative disc disease and spondylosis at L3-4, L4-5 and L5-S1, facet degenerative changes at these levels, degenerative grade 1 spondylolisthesis of L3 on L4 and L4 on L5, and degenerative changes in the sacroiliac joints. Visualized lung bases demonstrate bilateral pleural effusions, left greater than right, with passive atelectasis in the lower lobes. Heart mildly enlarged with aortic valvular calcification and severe 3 vessel coronary atherosclerosis.  IMPRESSION: 1. No evidence of bowel perforation. 2. Endoscopic stent with its proximal end in the common bile duct and its distal end in a diverticulum arising from the lateral wall of the descending duodenum. 3. Interval decrease in caliber of the common bile duct after stent placement. Gas within the common bile duct, common hepatic duct and independent intrahepatic ducts is consistent with stent patency. 4. Extensive diffuse colonic diverticulosis without evidence of acute diverticulitis. 5. Distended urinary bladder. 6. Bilateral pleural effusions, left greater than right, and  associated passive atelectasis in the lower lobes. 7. Severe generalized atherosclerosis as detailed above. 8. Osseous findings as above. These results were called by telephone at the time of interpretation on 02/04/2014 at 3:02 pm to Dr. Barney Drain , who verbally acknowledged these results.   Electronically Signed   By: Evangeline Dakin M.D.   On:  02/04/2014 15:02   Ct Abdomen Pelvis W Contrast  02/01/2014   CLINICAL DATA:  Gastritis, COPD, diverticulosis, hypertension, pancreatitis. Previous cholecystectomy. Abdominal pain.  EXAM: CT ABDOMEN AND PELVIS WITH CONTRAST  TECHNIQUE: Multidetector CT imaging of the abdomen and pelvis was performed using the standard protocol following bolus administration of intravenous contrast.  CONTRAST:  34m OMNIPAQUE IOHEXOL 300 MG/ML SOLN, 573mOMNIPAQUE IOHEXOL 300 MG/ML SOLN  COMPARISON:  CT of abdomen and pelvis on 12/14/2013  FINDINGS: Lower chest: Lung bases are unremarkable. Coronary artery calcification his significant. Heart is normal in size.  Upper abdomen: There is intrahepatic biliary ductal dilatation. Common bile duct is also dilated, measuring 13 mm. A distal common bile duct stone or stones again identified, similar in appearance to prior study. Since prior study, there has development of increased periportal edema. Status post cholecystectomy. There is no focal liver lesion. No focal abnormality identified within the spleen, pancreas, or adrenal glands. There are bilateral renal cysts. Small amount of fluid is identified around the spleen.  Bowel: Stomach and small bowel loops are normal in appearance. There are numerous colonic diverticula.  Pelvis: The uterus is present. No adnexal mass identified. No free pelvic fluid.  Retroperitoneum: There is atherosclerotic calcification of the abdominal aorta. No aneurysm.  Abdominal wall: Unremarkable  Osseous structures: There are significant degenerative changes in the lower lumbar spine with anterolisthesis of L3 on L4, L4 on L5, and L5 on S1. No suspicious lytic or blastic lesions.  IMPRESSION: 1. Intra and extrahepatic biliary ductal dilatation with distal common bile duct stones. Increased periportal edema since prior study. 2. Status post cholecystectomy. 3. Significant colonic diverticulosis. No evidence for acute diverticulitis. 4. Coronary artery  calcification.   Electronically Signed   By: BeShon Hale.D.   On: 02/01/2014 20:22   Dg Chest Portable 1 View  02/01/2014   CLINICAL DATA:  Chest pain  EXAM: PORTABLE CHEST - 1 VIEW  COMPARISON:  01/22/2008  FINDINGS: Cardiac shadow is within normal limits. The lungs are well aerated bilaterally. A prominent skin fold is noted over the left chest. Lung markings are noted beyond this region. No bony abnormality is seen.  IMPRESSION: Prominent skin fold low over the lateral left lung. No acute abnormality is noted.   Electronically Signed   By: MaInez Catalina.D.   On: 02/01/2014 16:09   Dg Ercp  02/02/2014   CLINICAL DATA:  Choledocholithiasis and cholangitis.  EXAM: ERCP  TECHNIQUE: Multiple spot images obtained with the fluoroscopic device and submitted for interpretation post-procedure.  COMPARISON:  CT on 02/01/2014.  FINDINGS: Images obtained with a C-arm during the ERCP procedure demonstrate cannulation of the common bile duct. Cholangiogram shows a tortuous and massively dilated common bile duct as well as intrahepatic biliary ductal dilatation. The opacified common bile duct shows irregular filling defects. A balloon sweep maneuver was performed.  IMPRESSION: Choledocholithiasis with massive common bile duct dilatation. A balloon sweep maneuver was performed.  These images were submitted for radiologic interpretation only. Please see the procedural report for the amount of contrast and the fluoroscopy time utilized.   Electronically Signed   By: GlEulas Post  Kathlene Cote M.D.   On: 02/02/2014 17:01    CBC  Recent Labs Lab 02/01/14 1630 02/02/14 0416 02/04/14 0615 02/06/14 0720 02/08/14 0547  WBC 14.0* 14.9* 12.3* 10.0 9.4  HGB 13.4 11.7* 12.4 12.0 11.0*  HCT 39.5 33.9* 36.3 34.9* 32.5*  PLT PLATELET CLUMPS NOTED ON SMEAR, COUNT APPEARS ADEQUATE 188 177 199 197  MCV 96.8 94.2 95.3 94.3 96.4  MCH 32.8 32.5 32.5 32.4 32.6  MCHC 33.9 34.5 34.2 34.4 33.8  RDW 13.5 13.8 14.4 13.7 13.8  LYMPHSABS  0.3*  --   --  1.2  --   MONOABS 1.2*  --   --  1.1*  --   EOSABS 0.1  --   --  0.2  --   BASOSABS 0.0  --   --  0.0  --     Chemistries   Recent Labs Lab 02/01/14 1630 02/02/14 0416 02/03/14 0815 02/04/14 0615 02/06/14 0720 02/08/14 0547  NA 141 142  --   --  138 142  K 4.1 3.4*  --   --  3.8 4.3  CL 96 100  --   --  101 104  CO2 29 29  --   --  27 30  GLUCOSE 168* 94  --   --  84 88  BUN 26* 20  --   --  19 23  CREATININE 1.02 0.99  --   --  0.97 0.83  CALCIUM 9.6 8.7  --   --  9.0 8.8  AST 1058* 518* 128* 59* 19 14  ALT 349* 282* 131* 100* 45* 24  ALKPHOS 549* 478* 364* 328* 222* 157*  BILITOT 1.6* 1.5* 1.0 0.7 0.6 0.3   ------------------------------------------------------------------------------------------------------------------ estimated creatinine clearance is 28.9 ml/min (by C-G formula based on Cr of 0.83). ------------------------------------------------------------------------------------------------------------------  Recent Labs  02/06/14 1953  HGBA1C 6.1*   ------------------------------------------------------------------------------------------------------------------  Recent Labs  02/07/14 0623  CHOL 124  HDL 25*  LDLCALC 70  TRIG 145  CHOLHDL 5.0   ------------------------------------------------------------------------------------------------------------------ No results found for this basename: TSH, T4TOTAL, FREET3, T3FREE, THYROIDAB,  in the last 72 hours ------------------------------------------------------------------------------------------------------------------ No results found for this basename: VITAMINB12, FOLATE, FERRITIN, TIBC, IRON, RETICCTPCT,  in the last 72 hours  Coagulation profile  Recent Labs Lab 02/06/14 1519  INR 2.38*    No results found for this basename: DDIMER,  in the last 72 hours  Cardiac Enzymes  Recent Labs Lab 02/06/14 0720  TROPONINI <0.30    ------------------------------------------------------------------------------------------------------------------ No components found with this basename: POCBNP,     Takari Lundahl D.O. on 02/08/2014 at 3:23 PM  Between 7am to 7pm - Pager - [phone]  After 7pm go to [email or web address] - password [email or web address]  And look for the night coverage person covering for me after hours  [facility] Office  [phone]

## 2014-02-08 NOTE — Progress Notes (Signed)
Pt. Still unable to void.  Bladder scan showed 229 ml's.  Notified MD.  Will follow MD's orders and bladder scan again at 20:00 if patient is still not voiding.  If bladder scan shows, 600-700 ml's, will replace foley.  Will continue to monitor patient.

## 2014-02-08 NOTE — Telephone Encounter (Signed)
Patient needs an appt at [facility] with Dr. Jerl Santos for evaluation; she needs an ERCP with stent removal at [facility] in 4-6 weeks. Will likely need a consultation with Dr. Amalia Hailey prior to ERCP.

## 2014-02-08 NOTE — Evaluation (Signed)
Occupational Therapy Evaluation Patient Details Name: [name] MRN: [id number] DOB: 1913/01/16 Today's Date: 02/08/2014    History of Present Illness [name] is an 80 y.o. female sent in from [facility] for abdominal pain.  She doesn't respond to me, so history was taken from record and EDP and staff.  She was more alert and was able to converse with the RN, but she only opened her eye when I saw her.  She did not have nausea, or vomiting, and did have low grade temperature.  She did not have any black or bloody stool.  Evalutuion in the ER included a CT of the abd./pelvis which showed dilated intra and extra hepatic ducts, with periportal edema.  She also has distal common bile duct stone with CBD dilated to 27m. She has had CCY.  Her LFTS were grossly elevated with AST of 1058, ASL 349 and AlK 549, with total bili of 1.6, mostly direct.  Her Cr was 1.02, and she has normal electrolytes.  Her Lipase was normal.  Her WBC was elevated to 14K, and her temperature in the ER was 100.8.  Her UA showed no infection, and her CXR was negative.  Hopsitalist was asked to admit her for further evaluation and Tx.     Clinical Impression   Pt presenting to acute OT with above situation.  She was lethargic during this morning's evaluation and had increased difficulty answering questions about prior functioning.  She indicated that she was receiving assist with most ADLs at baseline, and PT report confirms.  Pt demonstrated bed mobility skills with min assist and was able to eat with set up and cueing.  This is near  baseline functioning, and pt does not need further OT services at this time.  Acute OT to sign off.    Follow Up Recommendations  No OT follow up    Equipment Recommendations  Other (comment) (Pt could not provide deails of what she has availalbe at home.  [facility] if she does not already have them.)    Recommendations for Other Services        Precautions / Restrictions Precautions Precautions: Fall Restrictions Weight Bearing Restrictions: No      Mobility Bed Mobility Overal bed mobility: Needs Assistance Bed Mobility: Sit to Supine     Supine to sit: Min assist Sit to supine: Mod assist   General bed mobility comments: with HOB at 30 degrees  Transfers                      Balance                                            ADL Overall ADL's : Needs assistance/impaired Eating/Feeding: Supervision/ safety;Set up;Bed level Eating/Feeding Details (indicate cue type and reason): Cueing for initiation of eating   Grooming Details (indicate cue type and reason): Pt would not comb her hair, stating her daughter just did it for her yesterday.                                     Vision                     Perception  Praxis      Pertinent Vitals/Pain Pain Assessment: Faces Faces Pain Scale: Hurts a little bit Pain Location: stomach Pain Descriptors / Indicators: Aching Pain Intervention(s): Monitored during session;Premedicated before session     Hand Dominance Left   Extremity/Trunk Assessment Upper Extremity Assessment Upper Extremity Assessment: Generalized weakness   Lower Extremity Assessment Lower Extremity Assessment: Defer to PT evaluation       Communication Communication Communication: No difficulties   Cognition Arousal/Alertness: Lethargic Behavior During Therapy: WFL for tasks assessed/performed Overall Cognitive Status: No family/caregiver present to determine baseline cognitive functioning                     General Comments       Exercises       Shoulder Instructions      Home Living Family/patient expects to be discharged to:: Assisted living                             Home Equipment: Walker - 2 wheels          Prior Functioning/Environment Level of Independence: Needs assistance   Gait / Transfers Assistance Needed: pt needs assis to ambulate with a walker, transfer assist is unknown ADL's / Homemaking Assistance Needed: per PT report, "assist needed wth all ADLs"  Family not available to provide futher PLOF details and pt having difficulty providing deails at this time.  pt suggested she has been living with her daughter also, and that daughter has been providng assist with ADLs.        OT Diagnosis:     OT Problem List:     OT Treatment/Interventions:      OT Goals(Current goals can be found in the care plan section) Acute Rehab OT Goals Patient Stated Goal: none stated  OT Frequency:     Barriers to D/C:            Co-evaluation              End of Session    Activity Tolerance: Patient tolerated treatment well;Patient limited by lethargy Patient left: in bed;with nursing/sitter in room;with call bell/phone within reach;with bed alarm set   Time: 6190-1222 OT Time Calculation (min): 24 min Charges:  OT General Charges $OT Visit: 1 Procedure OT Evaluation $Initial OT Evaluation Tier I: 1 Procedure G-Codes:     Bea Graff, MS, OTR/L [phone]  02/08/2014, 9:08 AM

## 2014-02-08 NOTE — Progress Notes (Signed)
Subjective: Difficult to obtain clear history; mentions she is in pain but refers to admission. When asked again directly, denies pain. No N/V. Tolerating diet.    Objective: Vital signs in last 24 hours: Temp:  [98.2 F (36.8 C)-98.4 F (36.9 C)] 98.3 F (36.8 C) (09/23 0532) Pulse Rate:  [74-81] 76 (09/23 0532) Resp:  [16-20] 18 (09/23 0532) BP: (122-142)/(53-65) 122/59 mmHg (09/23 0532) SpO2:  [100 %] 100 % (09/23 0532) Last BM Date: 02/05/14 General:   Alert and oriented to person only.  Head:  Normocephalic and atraumatic.  Abdomen:  Bowel sounds present, soft, mild TTP RUQ and epigastric, no rebound or guarding Neurologic:  Alert and  oriented to person only   Intake/Output from previous day: 09/22 0701 - 09/23 0700 In: 240 [P.O.:240] Out: 650 [Urine:650] Intake/Output this shift:    Lab Results:  Recent Labs  02/06/14 0720 02/08/14 0547  WBC 10.0 9.4  HGB 12.0 11.0*  HCT 34.9* 32.5*  PLT 199 197   BMET  Recent Labs  02/06/14 0720 02/08/14 0547  NA 138 142  K 3.8 4.3  CL 101 104  CO2 27 30  GLUCOSE 84 88  BUN 19 23  CREATININE 0.97 0.83  CALCIUM 9.0 8.8   LFT  Recent Labs  02/06/14 0720  PROT 5.9*  ALBUMIN 2.5*  AST 19  ALT 45*  ALKPHOS 222*  BILITOT 0.6   PT/INR  Recent Labs  02/06/14 1519  LABPROT 26.0*  INR 2.38*     Studies/Results: Mr Brain Wo Contrast  02/06/2014   CLINICAL DATA:  80 year old female presenting with weakness and unresponsiveness. Diabetes and hypertension. Initial encounter.  EXAM: MRI HEAD WITHOUT CONTRAST  TECHNIQUE: Multiplanar, multiecho pulse sequences of the brain and surrounding structures were obtained without intravenous contrast.  COMPARISON:  02/06/2014 CT.  11/17/2003 MR.  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  No intracranial hemorrhage.  Prominent small vessel disease type changes.  No intracranial mass lesion noted on this unenhanced exam.  Global atrophy  without hydrocephalus.  Major intracranial vascular structures are patent.  Degenerative changes C1-2 articulation with inferior position of the C1 ring and superior projection of the dens which when combined with transverse ligament hypertrophy causes contact with the cervical medullary junction and mild spinal stenosis. C3-4 cervical spondylotic changes with spinal stenosis and mild cord flattening.  Incidentally noted is a partially empty non expanded sella.  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  Prominent small vessel disease type changes.  Global atrophy without hydrocephalus.  Degenerative changes C1-2 articulation with superior projection of the dens which causes contact with the cervical medullary junction and mild spinal stenosis. C3-4 cervical spondylotic changes with spinal stenosis and mild cord flattening.   Electronically Signed   By: Chauncey Cruel M.D.   On: 02/06/2014 13:06    Assessment: 80 year old female admitted with choledocholithiasis/cholangitis s/p ERCP with sphincterotomy, stone extraction, stent placement on 9/17, with worsening abdominal pain and CT revealing stent embedded in diverticulum. Stent unable to be repositioned with EGD. Urinary retention also noted from CT 9/19, with need for foley placement. Difficult historian due to cognitive status, but appears to continue improving clinically. Some tenderness on abdominal exam but without peritoneal signs. Will recheck LFTs today. No leukocytosis.  Likely urinary retention playing a large role in prior symptoms.  Repeat ERCP on hold due to acute neurological changes recently; as patient continues to improve, may hold on ERCP while inpatient and perform electively as  an outpatient in 6 weeks for stent removal. As of note, per anesthesia, if repeat ERCP needed this admission, would need to be performed at a facility with IR capabilities and neurologist available.   Gastric biopsy: negative for  H.pylori   Plan: Recheck LFTs now PPI BID Dysphagia 2 diet Continue Augmentin Repeat ERCP in 6 weeks as outpatient  Orvil Feil, ANP-BC [facility]    LOS: 7 days    02/08/2014, 7:55 AM   Attending note:  Patient seen and examined. Progress since ERCP reviewed. Bile duct diameter has decreased significantly with stent placement;  LFTs are near normal. Urinary retention improved with catheterization. Duodenal end of stent sitting in a diverticulum.  Patient denies abdominal pain this afternoon and her abdominal exam is benign.  I doubt the duodenal end of the stent in a diverticulum is causing any problems. I would not recommend it be manipulated at this time. However, in several weeks I would recommend stent removal and reassessment of her bile duct. She may benefit from sphincterotomy balloon dilation and final dredging of her bile duct. Hopefully, she can be left alone after that intervention. I agree with the anesthesia service that the followup ERCP should be done at a tertiary referral center as her overall health status is tenuous.

## 2014-02-08 NOTE — Telephone Encounter (Signed)
Referral faxed

## 2014-02-08 NOTE — Discharge Summary (Addendum)
Physician Discharge Summary  [name] EHM:[id number] DOB: July 23, 1912 DOA: 02/01/2014  PCP: [name], MD  Admit date: 02/01/2014 Discharge date: 02/09/2014  Time spent: 45 minutes  Recommendations for Outpatient Follow-up:  Patient will be discharged to [facility].  She is to continue physical therapy as recommended by the center. Patient to continue her medications as prescribed. She should also follow up with her primary care physician within one week of discharge. Patient also need to follow up with [facility], this probably will be made by gastroenterology. Patient should also follow up with [facility], this appointment will be arranged by the office. Patient is to follow up with Dr. Merlene Laughter in one month. Patient should also follow up with urology as an outpatient. Patient continue a dysphagia 2 diet.   Discharge Diagnoses:  Acute encephalopathy Choledocholithiasis, suspected acute cholangitis Abdominal pain Urinary retention Diabetes mellitus, type II History of COPD  Discharge Condition: Stable  Diet recommendation: Dysphagia 2  Filed Weights   02/01/14 2319 02/04/14 0450  Weight: 42 kg (92 lb 9.5 oz) 48.353 kg (106 lb 9.6 oz)    History of present illness:  on 02/01/2014  [name] is an 80 y.o. female sent in from [facility] for abdominal pain. She did not respond to the attending physician, so history was taken from record and EDP and staff. She was more alert and was able to converse with the RN. She did not have nausea, or vomiting, but did have low grade temperature. She did not have any black or bloody stool. Evalutuion in the ER included a CT of the abd./pelvis which showed dilated intra and extra hepatic ducts, with periportal edema. She also has distal common bile duct stone with CBD dilated to 69m. She has had CCY. Her LFTS were grossly elevated with AST of 1058, ASL 349 and AlK 549, with total bili of  1.6, mostly direct. Her Cr was 1.02, and she has normal electrolytes. Her Lipase was normal. Her WBC was elevated to 14K, and her temperature in the ER was 100.8. Her UA showed no infection, and her CXR was negative. Hopsitalist was asked to admit her for further evaluation and treatment.  Hospital Course:  Acute encephalopathy  -Secondary to unclear etiology at this time  -Neurology consulted and appreciated and recommended Keppra 2551mdaily -EEG: normal recording awake and sleep states. -Patient has no signs of infection  -Unlikely related to patient's acute CVA   Choledocholithiasis, suspected acute cholangitis  -Patient has had ERCP with biliary sphincterotomy and stone extraction with stent placement in the past  -Gastroenterology consulted and appreciated  -Imaging showed stent migration, it was unable to be relocated with EGD  -Repeat ERCP was planned for this morning however canceled due to patient's current state  -Spoke with gastroenterology PA, patient may have an outpatient ERCP.  -Currently patient is stable  -Continue Augmentin (total course for 10 days) and PPI  -Continue dysphagia 2 diet  -Spoke with GI, [facility], this appointment will be arranged by GI [facility]  Acute CVA -MRI head:Tiny acute nonhemorrhagic infarct inferior right cerebellum. -LDL 70, HbA1c 6.1 -Neurology consulted and appreciated -Echocardiogram: EF 6536-62%Grade 1 diastolic dysfunction -Continue aspirin for secondary prevention -PT and OT consulted.  OT had no further recommendations.  [facility] Abdominal pain  -Appears to resolve this morning  -  Imaging unrevealing   Urinary retention  -Foley was placed on 02/05/2014, failed voiding trial and foley replaced  -Patient started on Flomax -Urology followup as outpatient  Diabetes mellitus, type II   -Continue insulin sliding scale with CBG monitoring   History of COPD  -Appears to compensated   Protein calorie malnutrition -Continue feeding supplementation  Procedures: EEG: normal recording awake and sleep states.  02/02/2014 ERCP  Echocardiogram Study Conclusions - Left ventricle: The cavity size was normal. There was focal basal hypertrophy. Systolic function was vigorous. The estimated ejection fraction was in the range of 65% to 70%. Doppler parameters are consistent with abnormal left ventricular relaxation (grade 1 diastolic dysfunction). - Aortic valve: Mildly calcified annulus. Mildly thickened leaflets. There was moderate regurgitation. Valve area (VTI): 1.78 cm^2. Regurgitation pressure half-time: 332 ms.  - Mitral valve: Moderately calcified annulus. Mildly thickened leaflets . There is mild mitral stenosis by mean gradient, normal valve area by pressure half time. MVA by pressure half time 2.8 cm squared. - Atrial septum: No defect or patent foramen ovale was identified. - Pulmonary arteries: Systolic pressure was moderately increased. PA peak pressure: 50 mm Hg (S). - Inferior vena cava: The vessel was normal in size. The respirophasic diameter changes were in the normal range (>= 50%), consistent with normal central venous pressure. - Technically difficult study.  Consultations: Gastroenterology Neurology  Discharge Exam: Filed Vitals:   02/09/14 1105  BP: 139/53  Pulse: 90  Temp: 98.7 F (37.1 C)  Resp: 16   Exam  General: Well developed, thin, elderly, NAD HEENT: NCAT, mucous membranes moist.  Cardiovascular: S1 S2 auscultated, no rubs, murmurs or gallops. Regular rate and rhythm.  Respiratory: Clear to auscultation bilaterally with equal chest rise  Abdomen: Soft, nontender, nondistended, + bowel sounds  Extremities: warm dry without cyanosis clubbing or edema  Neuro: Awake and alert, and oriented to person and place, no focal deficits  Discharge  Instructions      Discharge Instructions   Discharge instructions    Complete by:  As directed   Patient will be discharged to [facility].  She is to continue physical therapy as recommended by the center. Patient to continue her medications as prescribed. She should also follow up with her primary care physician within one week of discharge. Patient also need to [facility], this probably will be made by gastroenterology. Patient should also follow up with [facility], this appointment will be arranged by the office. Patient is to follow up with Dr. Merlene Laughter in one month. Patient should also follow up with urology as an outpatient. Patient continue a dysphagia 2 diet.            Medication List         AMBIEN 5 MG tablet  Generic drug:  zolpidem  Take 5 mg by mouth at bedtime as needed for sleep.     amoxicillin-clavulanate 500-125 MG per tablet  Commonly known as:  AUGMENTIN  Take 1 tablet (500 mg total) by mouth 2 (two) times daily.     aspirin 325 MG tablet  Take 1 tablet (325 mg total) by mouth daily.     Calcium Citrate-Vitamin D 200-250 MG-UNIT Tabs  Take 1 tablet by mouth daily.     DAILY VITAMIN PO  Take 1 tablet by mouth daily.     docusate sodium 100 MG capsule  Commonly known as:  COLACE  Take 1 capsule (100 mg total) by mouth every 12 (  twelve) hours.     feeding supplement (ENSURE COMPLETE) Liqd  Take 237 mLs by mouth 4 (four) times daily -  with meals and at bedtime.     ferrous sulfate 325 (65 FE) MG tablet  Take 325 mg by mouth daily with breakfast.     furosemide 40 MG tablet  Commonly known as:  LASIX  Take 40 mg by mouth daily.     HYDROcodone-acetaminophen 5-325 MG per tablet  Commonly known as:  NORCO/VICODIN  Take 1 tablet by mouth every 6 (six) hours as needed for moderate pain.     levETIRAcetam 250 MG tablet  Commonly known as:  KEPPRA  Take 1 tablet (250 mg total) by mouth daily.      levothyroxine 25 MCG tablet  Commonly known as:  SYNTHROID, LEVOTHROID  Take 25 mcg by mouth daily before breakfast.     montelukast 10 MG tablet  Commonly known as:  SINGULAIR  Take 10 mg by mouth every morning.     pantoprazole 40 MG tablet  Commonly known as:  PROTONIX  Take 40 mg by mouth daily.     solifenacin 5 MG tablet  Commonly known as:  VESICARE  Take 5 mg by mouth daily.     tamsulosin 0.4 MG Caps capsule  Commonly known as:  FLOMAX  Take 1 capsule (0.4 mg total) by mouth daily.     theophylline 200 MG 12 hr tablet  Commonly known as:  THEODUR  Take 200 mg by mouth 2 (two) times daily.       No Known Allergies Follow-up Information   Follow up with [name], MD. Schedule an appointment as soon as possible for a visit in 1 week. [facility] followup)    Specialty:  Family Medicine   Contact information:   [address] [place] [address] [phone]       Follow up with [facility], Trey Sailors, MD. Schedule an appointment as soon as possible for a visit in 1 month. [facility] followup)    Specialty:  Neurology   Contact information:   [address] [place] [address] [phone]       Follow up with [facility]. Schedule an appointment as soon as possible for a visit in 1 week. [facility] followup, Urinary retention)    Contact information:   [address] [place] [phone]       The results of significant diagnostics from this hospitalization (including imaging, microbiology, ancillary and laboratory) are listed below for reference.    Significant Diagnostic Studies: Ct Abdomen Wo Contrast  03/08/14   CLINICAL DATA:  Altered mental status. 80 year old with abdominal pain and low-grade fever. Admitted for cholangitis and choledocholithiasis.  EXAM: CT ABDOMEN WITHOUT CONTRAST  TECHNIQUE: Multidetector CT imaging of the abdomen was performed following the standard protocol without IV  contrast.  COMPARISON:  02/04/2014  FINDINGS: There are stable bilateral small pleural effusions, left side greater than right. No evidence for free air.  Again noted is diffuse pneumobilia consistent with prior biliary intervention. There continues to be a non metallic biliary stent in the distal common bile duct that extends into the duodenum. Adjacent to the proximal aspect of the stent, there appears to be a calcified stone on sequence 2, image 23. This 1 cm stone is best visualized on sequence 3, image 33. Gallbladder has been removed. No acute abnormality to the liver. Spleen is small and unchanged. No acute abnormality involving the adrenal glands  or pancreas. There are low-density structures in both kidneys which are difficult to evaluate on this exam but likely represent cortical cysts. Negative for hydronephrosis.  The abdominal aorta is heavily calcified without aneurysm. Calcifications along the periphery of the visualized uterus. There are multiple colonic diverticula in the left colon. No significant free fluid or lymphadenopathy within the abdomen. No acute abnormality involving the stomach. Multilevel disc and facet disease in the lumbar spine.  IMPRESSION: Stable position of the biliary stent. There appears to be a stone adjacent to the proximal aspect of the stent. Again noted is diffuse pneumobilia.  Diffuse colonic diverticulosis.  Stable bilateral pleural effusions, left side greater than right.   Electronically Signed   By: Markus Daft M.D.   On: 02/06/2014 08:09   Dg Chest 1 View  02/06/2014   CLINICAL DATA:  Altered mental status  EXAM: CHEST - 1 VIEW  COMPARISON:  02/01/2014  FINDINGS: Haziness of the lower chest which correlates with small pleural effusions and atelectasis on contemporaneous abdominal CT. Changes are more extensive on the left. No edema. Skin folds noted biapically; no pneumothorax suspected. Normal heart size and upper mediastinal contours for age. No acute osseous  findings.  IMPRESSION: Small bilateral pleural effusion with atelectasis. No edema or pneumonia.   Electronically Signed   By: Jorje Guild M.D.   On: 02/06/2014 06:32   Ct Head Wo Contrast  02/06/2014   CLINICAL DATA:  Altered mental status.  EXAM: CT HEAD WITHOUT CONTRAST  TECHNIQUE: Contiguous axial images were obtained from the base of the skull through the vertex without intravenous contrast.  COMPARISON:  CT of the head January 22, 2008  FINDINGS: The ventricles and sulci are normal for age. No intraparenchymal hemorrhage, mass effect nor midline shift. Patchy patchy to confluent supratentorial white matter hypodensities are within normal range for patient's age and though non-specific suggest sequelae of chronic small vessel ischemic disease. No acute large vascular territory infarcts.  No abnormal extra-axial fluid collections. Basal cisterns are patent. Severe calcific atherosclerosis of the carotid siphons.  No skull fracture. Patient is osteopenic. The included ocular globes and orbital contents are non-suspicious. Status post LEFT ocular lens implant. The mastoid aircells and included paranasal sinuses are well-aerated.  IMPRESSION: No acute intracranial process.  Involutional changes. Moderate white matter changes suggest chronic small vessel ischemic disease.   Electronically Signed   By: Elon Alas   On: 02/06/2014 06:28   Mr Brain Wo Contrast  02/06/2014   CLINICAL DATA:  80 year old female presenting with weakness and unresponsiveness. Diabetes and hypertension. Initial encounter.  EXAM: MRI HEAD WITHOUT CONTRAST  TECHNIQUE: Multiplanar, multiecho pulse sequences of the brain and surrounding structures were obtained without intravenous contrast.  COMPARISON:  02/06/2014 CT.  11/17/2003 MR.  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  No intracranial hemorrhage.  Prominent small vessel disease type changes.  No intracranial mass lesion noted on this  unenhanced exam.  Global atrophy without hydrocephalus.  Major intracranial vascular structures are patent.  Degenerative changes C1-2 articulation with inferior position of the C1 ring and superior projection of the dens which when combined with transverse ligament hypertrophy causes contact with the cervical medullary junction and mild spinal stenosis. C3-4 cervical spondylotic changes with spinal stenosis and mild cord flattening.  Incidentally noted is a partially empty non expanded sella.  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  Prominent small vessel disease type changes.  Global atrophy without hydrocephalus.  Degenerative changes C1-2 articulation  with superior projection of the dens which causes contact with the cervical medullary junction and mild spinal stenosis. C3-4 cervical spondylotic changes with spinal stenosis and mild cord flattening.   Electronically Signed   By: Chauncey Cruel M.D.   On: 02/06/2014 13:06   Ct Abdomen Pelvis W Contrast  02/04/2014   CLINICAL DATA:  Worsening abdominal pain after ERCP and sphincterotomy yesterday.  EXAM: CT ABDOMEN AND PELVIS WITH CONTRAST  TECHNIQUE: Multidetector CT imaging of the abdomen and pelvis was performed using the standard protocol following bolus administration of intravenous contrast.  CONTRAST:  148m OMNIPAQUE IOHEXOL 300 MG/ML IV. Oral contrast was not administered.  COMPARISON:  Multiple prior CT abdomen and pelvis: 02/01/2014, 12/14/2013, dating back to 11/17/2007.  FINDINGS: Endoscopic biliary stent with its proximal in the in the common bile duct and the distal end in what I believe is a small diverticulum arising from the descending duodenum laterally, as this was a contrast filled structure on the CT 3 days ago and is now filled with air. Gas within the common bile duct, common hepatic duct in the independent bile ducts in the liver, consistent with stent patency. Interval decrease in the caliber the common  bile duct. Calcified granuloma in the anterior segment right lobe of liver at the dome; no significant focal hepatic parenchymal abnormalities. Gallbladder surgically absent.  Atrophic spleen and pancreas without focal parenchymal abnormalities involving either organ. Normal adrenal glands. Cortical cysts involving both kidneys; no significant abnormality involving either kidney. Extensive aortoiliofemoral atherosclerosis without aneurysm or dissection. Visceral arteries atherosclerotic though patent; high-grade stenosis at the origin of the celiac artery with widely patent SMA and IMA origins. No significant lymphadenopathy.  Normal-appearing stomach filled with food. Normal-appearing small bowel. Extensive diffuse colonic diverticulosis without evidence of acute diverticulitis. No ascites.  Markedly distended urinary bladder. Atrophic uterus consistent with age. No adnexal masses or free pelvic fluid.  Bone window images demonstrate osseous demineralization, degenerative disc disease and spondylosis at L3-4, L4-5 and L5-S1, facet degenerative changes at these levels, degenerative grade 1 spondylolisthesis of L3 on L4 and L4 on L5, and degenerative changes in the sacroiliac joints. Visualized lung bases demonstrate bilateral pleural effusions, left greater than right, with passive atelectasis in the lower lobes. Heart mildly enlarged with aortic valvular calcification and severe 3 vessel coronary atherosclerosis.  IMPRESSION: 1. No evidence of bowel perforation. 2. Endoscopic stent with its proximal end in the common bile duct and its distal end in a diverticulum arising from the lateral wall of the descending duodenum. 3. Interval decrease in caliber of the common bile duct after stent placement. Gas within the common bile duct, common hepatic duct and independent intrahepatic ducts is consistent with stent patency. 4. Extensive diffuse colonic diverticulosis without evidence of acute diverticulitis. 5. Distended  urinary bladder. 6. Bilateral pleural effusions, left greater than right, and associated passive atelectasis in the lower lobes. 7. Severe generalized atherosclerosis as detailed above. 8. Osseous findings as above. These results were called by telephone at the time of interpretation on 02/04/2014 at 3:02 pm to Dr. SBarney Drain, who verbally acknowledged these results.   Electronically Signed   By: TEvangeline DakinM.D.   On: 02/04/2014 15:02   Ct Abdomen Pelvis W Contrast  02/01/2014   CLINICAL DATA:  Gastritis, COPD, diverticulosis, hypertension, pancreatitis. Previous cholecystectomy. Abdominal pain.  EXAM: CT ABDOMEN AND PELVIS WITH CONTRAST  TECHNIQUE: Multidetector CT imaging of the abdomen and pelvis was performed using the standard protocol following bolus administration  of intravenous contrast.  CONTRAST:  64m OMNIPAQUE IOHEXOL 300 MG/ML SOLN, 567mOMNIPAQUE IOHEXOL 300 MG/ML SOLN  COMPARISON:  CT of abdomen and pelvis on 12/14/2013  FINDINGS: Lower chest: Lung bases are unremarkable. Coronary artery calcification his significant. Heart is normal in size.  Upper abdomen: There is intrahepatic biliary ductal dilatation. Common bile duct is also dilated, measuring 13 mm. A distal common bile duct stone or stones again identified, similar in appearance to prior study. Since prior study, there has development of increased periportal edema. Status post cholecystectomy. There is no focal liver lesion. No focal abnormality identified within the spleen, pancreas, or adrenal glands. There are bilateral renal cysts. Small amount of fluid is identified around the spleen.  Bowel: Stomach and small bowel loops are normal in appearance. There are numerous colonic diverticula.  Pelvis: The uterus is present. No adnexal mass identified. No free pelvic fluid.  Retroperitoneum: There is atherosclerotic calcification of the abdominal aorta. No aneurysm.  Abdominal wall: Unremarkable  Osseous structures: There are  significant degenerative changes in the lower lumbar spine with anterolisthesis of L3 on L4, L4 on L5, and L5 on S1. No suspicious lytic or blastic lesions.  IMPRESSION: 1. Intra and extrahepatic biliary ductal dilatation with distal common bile duct stones. Increased periportal edema since prior study. 2. Status post cholecystectomy. 3. Significant colonic diverticulosis. No evidence for acute diverticulitis. 4. Coronary artery calcification.   Electronically Signed   By: BeShon Hale.D.   On: 02/01/2014 20:22   Dg Chest Portable 1 View  02/01/2014   CLINICAL DATA:  Chest pain  EXAM: PORTABLE CHEST - 1 VIEW  COMPARISON:  01/22/2008  FINDINGS: Cardiac shadow is within normal limits. The lungs are well aerated bilaterally. A prominent skin fold is noted over the left chest. Lung markings are noted beyond this region. No bony abnormality is seen.  IMPRESSION: Prominent skin fold low over the lateral left lung. No acute abnormality is noted.   Electronically Signed   By: MaInez Catalina.D.   On: 02/01/2014 16:09   Dg Ercp  02/02/2014   CLINICAL DATA:  Choledocholithiasis and cholangitis.  EXAM: ERCP  TECHNIQUE: Multiple spot images obtained with the fluoroscopic device and submitted for interpretation post-procedure.  COMPARISON:  CT on 02/01/2014.  FINDINGS: Images obtained with a C-arm during the ERCP procedure demonstrate cannulation of the common bile duct. Cholangiogram shows a tortuous and massively dilated common bile duct as well as intrahepatic biliary ductal dilatation. The opacified common bile duct shows irregular filling defects. A balloon sweep maneuver was performed.  IMPRESSION: Choledocholithiasis with massive common bile duct dilatation. A balloon sweep maneuver was performed.  These images were submitted for radiologic interpretation only. Please see the procedural report for the amount of contrast and the fluoroscopy time utilized.   Electronically Signed   By: GlAletta Edouard.D.   On:  02/02/2014 17:01    Microbiology: Recent Results (from the past 240 hour(s))  CULTURE, BLOOD (ROUTINE X 2)     Status: None   Collection Time    02/01/14  9:55 PM      Result Value Ref Range Status   Specimen Description BLOOD LEFT HAND   Final   Special Requests BOTTLES DRAWN AEROBIC AND ANAEROBIC 6CC   Final   Culture NO GROWTH 5 DAYS   Final   Report Status 02/06/2014 FINAL   Final  CULTURE, BLOOD (ROUTINE X 2)     Status: None   Collection Time  02/01/14  9:55 PM      Result Value Ref Range Status   Specimen Description BLOOD LEFT ANTECUBITAL   Final   Special Requests     Final   Value: BOTTLES DRAWN AEROBIC AND ANAEROBIC AEB=8CC ANA=4CC   Culture NO GROWTH 5 DAYS   Final   Report Status 02/06/2014 FINAL   Final  MRSA PCR SCREENING     Status: None   Collection Time    02/01/14 11:55 PM      Result Value Ref Range Status   MRSA by PCR NEGATIVE  NEGATIVE Final   Comment:            The GeneXpert MRSA Assay (FDA     approved for NASAL specimens     only), is one component of a     comprehensive MRSA colonization     surveillance program. It is not     intended to diagnose MRSA     infection nor to guide or     monitor treatment for     MRSA infections.     Labs: Basic Metabolic Panel:  Recent Labs Lab 02/06/14 0720 02/08/14 0547 02/09/14 0627  NA 138 142 141  K 3.8 4.3 5.0  CL 101 104 104  CO2 _0 GLUCOSE 84 88 117*  BUN _1 CREATININE 0.97 0.83 0.80  CALCIUM 9.0 8.8 8.8   Liver Function Tests:  Recent Labs Lab 02/03/14 0815 02/04/14 0615 02/06/14 0720 02/08/14 0547  AST 128* 59* 19 14  ALT 131* 100* 45* 24  ALKPHOS 364* 328* 222* 157*  BILITOT 1.0 0.7 0.6 0.3  PROT 5.4* 6.1 5.9* 5.5*  ALBUMIN 2.3* 2.4* 2.5* 2.3*    Recent Labs Lab 02/04/14 0615  LIPASE 56   No results found for this basename: AMMONIA,  in the last 168 hours CBC:  Recent Labs Lab 02/04/14 0615 02/06/14 0720 02/08/14 0547 02/09/14 0627  WBC 12.3*  10.0 9.4 8.8  NEUTROABS  --  7.5  --   --   HGB 12.4 12.0 11.0* 10.8*  HCT 36.3 34.9* 32.5* 32.3*  MCV 95.3 94.3 96.4 96.1  PLT 177 199 197 217   Cardiac Enzymes:  Recent Labs Lab 02/06/14 0720  TROPONINI <0.30   BNP: BNP (last 3 results) No results found for this basename: PROBNP,  in the last 8760 hours CBG:  Recent Labs Lab 02/08/14 1513 02/08/14 1641 02/08/14 2342 02/09/14 0535 02/09/14 0824  GLUCAP 163* 166* 131* 105* 97       Signed:  Eliud Polo  [facility] 02/09/2014, 11:50 AM

## 2014-02-09 DIAGNOSIS — I6350 Cerebral infarction due to unspecified occlusion or stenosis of unspecified cerebral artery: Secondary | ICD-10-CM

## 2014-02-09 DIAGNOSIS — K219 Gastro-esophageal reflux disease without esophagitis: Secondary | ICD-10-CM

## 2014-02-09 LAB — GLUCOSE, CAPILLARY
GLUCOSE-CAPILLARY: 157 mg/dL — AB (ref 70–99)
Glucose-Capillary: 105 mg/dL — ABNORMAL HIGH (ref 70–99)
Glucose-Capillary: 97 mg/dL (ref 70–99)

## 2014-02-09 LAB — CBC
HEMATOCRIT: 32.3 % — AB (ref 36.0–46.0)
Hemoglobin: 10.8 g/dL — ABNORMAL LOW (ref 12.0–15.0)
MCH: 32.1 pg (ref 26.0–34.0)
MCHC: 33.4 g/dL (ref 30.0–36.0)
MCV: 96.1 fL (ref 78.0–100.0)
Platelets: 217 10*3/uL (ref 150–400)
RBC: 3.36 MIL/uL — ABNORMAL LOW (ref 3.87–5.11)
RDW: 13.8 % (ref 11.5–15.5)
WBC: 8.8 10*3/uL (ref 4.0–10.5)

## 2014-02-09 LAB — BASIC METABOLIC PANEL
Anion gap: 6 (ref 5–15)
BUN: 19 mg/dL (ref 6–23)
CHLORIDE: 104 meq/L (ref 96–112)
CO2: 31 mEq/L (ref 19–32)
Calcium: 8.8 mg/dL (ref 8.4–10.5)
Creatinine, Ser: 0.8 mg/dL (ref 0.50–1.10)
GFR calc non Af Amer: 59 mL/min — ABNORMAL LOW (ref >90)
GFR, EST AFRICAN AMERICAN: 69 mL/min — AB (ref >90)
GLUCOSE: 117 mg/dL — AB (ref 70–99)
Potassium: 5 mEq/L (ref 3.7–5.3)
Sodium: 141 mEq/L (ref 137–147)

## 2014-02-09 MED ORDER — LEVETIRACETAM 250 MG PO TABS: 250.0000 mg | ORAL_TABLET | Freq: Every day | ORAL | Status: DC

## 2014-02-09 NOTE — Progress Notes (Signed)
Physical Therapy Treatment Patient Details Name: [name] MRN: [id number] DOB: Jun 02, 1912 Today's Date: 02/09/2014    History of Present Illness [name] is an 80 y.o. female sent in from [facility] for abdominal pain.  She doesn't respond to me, so history was taken from record and EDP and staff.  She was more alert and was able to converse with the RN, but she only opened her eye when I saw her.  She did not have nausea, or vomiting, and did have low grade temperature.  She did not have any black or bloody stool.  Evalutuion in the ER included a CT of the abd./pelvis which showed dilated intra and extra hepatic ducts, with periportal edema.  She also has distal common bile duct stone with CBD dilated to 65m. She has had CCY.  Her LFTS were grossly elevated with AST of 1058, ASL 349 and AlK 549, with total bili of 1.6, mostly direct.  Her Cr was 1.02, and she has normal electrolytes.  Her Lipase was normal.  Her WBC was elevated to 14K, and her temperature in the ER was 100.8.  Her UA showed no infection, and her CXR was negative.  Hopsitalist was asked to admit her for further evaluation and Tx.      PT Comments    Pt apparently experienced a seizure earlier today and is quite lethargic this morning.  However, she was able to stay awake and was appropriate, very willing to work with PT.  Her vital signs were WNL but her overall strength was diminished from 02-07-14.  She was unable to functionally walk today with a walker but could transfer with one along with mod assist.  She was transferred to a chair.  Follow Up Recommendations  Home health PT     Equipment Recommendations  None recommended by PT    Recommendations for Other Services  nnone     Precautions / Restrictions Precautions Precautions: Fall Restrictions Weight Bearing Restrictions: No    Mobility  Bed Mobility Overal bed mobility: Needs Assistance Bed Mobility: Supine to Sit     Supine to sit:  Min guard        Transfers Overall transfer level: Needs assistance Equipment used: Rolling walker (2 wheeled) Transfers: Sit to/from Stand Sit to Stand: Min assist            Ambulation/Gait Ambulation/Gait assistance: Mod assist Ambulation Distance (Feet): 2 Feet Assistive device: Standard walker Gait Pattern/deviations: Decreased step length - right;Decreased step length - left;Trunk flexed   Gait velocity interpretation: Below normal speed for age/gender General Gait Details: Pt gait endurance is significantly decreased today, presumably from seizure experienced this AM.   Stairs            Wheelchair Mobility    Modified Rankin (Stroke Patients Only)       Balance                                    Cognition Arousal/Alertness: Lethargic Behavior During Therapy: WFL for tasks assessed/performed Overall Cognitive Status: Impaired/Different from baseline                      Exercises General Exercises - Lower Extremity Ankle Circles/Pumps: AROM;Both;10 reps;Supine Short Arc Quad: AAROM;Both;10 reps;Supine Heel Slides: AAROM;Both;10 reps;Supine Hip ABduction/ADduction: AAROM;Both;10 reps;Supine    General Comments        Pertinent Vitals/Pain Pain  Assessment: No/denies pain    Home Living                      Prior Function            PT Goals (current goals can now be found in the care plan section) Progress towards PT goals: Not progressing toward goals - comment (increased lethargy today)    Frequency       PT Plan Current plan remains appropriate    Co-evaluation             End of Session Equipment Utilized During Treatment: Gait belt Activity Tolerance: Patient limited by lethargy Patient left: in chair;with call bell/phone within reach;with chair alarm set     Time: 8682-5749 PT Time Calculation (min): 27 min  Charges:  $Therapeutic Exercise: 8-22 mins $Therapeutic Activity: 8-22  mins                    G Codes:      Demetrios Isaacs L 09-Mar-2014, 11:11 AM

## 2014-02-09 NOTE — Plan of Care (Signed)
Problem: Discharge/Transitional Outcomes Goal: Barriers To Progression Addressed/Resolved Outcome: Completed/Met Date Met:  02/09/14 Skilled Goal: Independent mobility/functioning independent or with min Independent mobility/functioning independently or with minimal assistance  Outcome: Progressing Pt up to chair with assist Goal: PCP appointment made and transportation plan in place Outcome: Completed/Met Date Met:  02/09/14 Pt dc to skilled

## 2014-02-09 NOTE — Clinical Social Work Note (Signed)
Pt d/c today back to [facility]. Pt's guardian Netta Cedars and facility aware and agreeable. D/C summary and FL2 faxed. Pt to transport via facility van.   [name], [place]

## 2014-02-13 ENCOUNTER — Emergency Department (HOSPITAL_COMMUNITY): Payer: PRIVATE HEALTH INSURANCE

## 2014-02-13 ENCOUNTER — Telehealth: Payer: Self-pay

## 2014-02-13 ENCOUNTER — Encounter (HOSPITAL_COMMUNITY): Payer: Self-pay | Admitting: Emergency Medicine

## 2014-02-13 ENCOUNTER — Emergency Department (HOSPITAL_COMMUNITY)
Admission: EM | Admit: 2014-02-13 | Discharge: 2014-02-13 | Disposition: A | Discharge status: Home / Self Care | Payer: PRIVATE HEALTH INSURANCE | Attending: Emergency Medicine | Admitting: Emergency Medicine

## 2014-02-13 DIAGNOSIS — Z79899 Other long term (current) drug therapy: Secondary | ICD-10-CM | POA: Diagnosis not present

## 2014-02-13 DIAGNOSIS — Z792 Long term (current) use of antibiotics: Secondary | ICD-10-CM | POA: Diagnosis not present

## 2014-02-13 DIAGNOSIS — E039 Hypothyroidism, unspecified: Secondary | ICD-10-CM | POA: Insufficient documentation

## 2014-02-13 DIAGNOSIS — R404 Transient alteration of awareness: Secondary | ICD-10-CM

## 2014-02-13 DIAGNOSIS — IMO0002 Reserved for concepts with insufficient information to code with codable children: Secondary | ICD-10-CM | POA: Diagnosis not present

## 2014-02-13 DIAGNOSIS — Z7982 Long term (current) use of aspirin: Secondary | ICD-10-CM | POA: Insufficient documentation

## 2014-02-13 DIAGNOSIS — I10 Essential (primary) hypertension: Secondary | ICD-10-CM | POA: Insufficient documentation

## 2014-02-13 DIAGNOSIS — J449 Chronic obstructive pulmonary disease, unspecified: Secondary | ICD-10-CM | POA: Insufficient documentation

## 2014-02-13 DIAGNOSIS — M1990 Unspecified osteoarthritis, unspecified site: Secondary | ICD-10-CM | POA: Insufficient documentation

## 2014-02-13 DIAGNOSIS — J4489 Other specified chronic obstructive pulmonary disease: Secondary | ICD-10-CM | POA: Insufficient documentation

## 2014-02-13 DIAGNOSIS — E119 Type 2 diabetes mellitus without complications: Secondary | ICD-10-CM | POA: Diagnosis not present

## 2014-02-13 DIAGNOSIS — Z9889 Other specified postprocedural states: Secondary | ICD-10-CM | POA: Insufficient documentation

## 2014-02-13 DIAGNOSIS — R4182 Altered mental status, unspecified: Secondary | ICD-10-CM | POA: Insufficient documentation

## 2014-02-13 DIAGNOSIS — K219 Gastro-esophageal reflux disease without esophagitis: Secondary | ICD-10-CM | POA: Diagnosis not present

## 2014-02-13 DIAGNOSIS — M6281 Muscle weakness (generalized): Secondary | ICD-10-CM | POA: Diagnosis present

## 2014-02-13 DIAGNOSIS — Z8619 Personal history of other infectious and parasitic diseases: Secondary | ICD-10-CM | POA: Insufficient documentation

## 2014-02-13 LAB — COMPREHENSIVE METABOLIC PANEL
ALBUMIN: 2.8 g/dL — AB (ref 3.5–5.2)
ALK PHOS: 128 U/L — AB (ref 39–117)
ALT: 12 U/L (ref 0–35)
AST: 13 U/L (ref 0–37)
Anion gap: 11 (ref 5–15)
BILIRUBIN TOTAL: 0.3 mg/dL (ref 0.3–1.2)
BUN: 23 mg/dL (ref 6–23)
CHLORIDE: 99 meq/L (ref 96–112)
CO2: 29 meq/L (ref 19–32)
Calcium: 9.3 mg/dL (ref 8.4–10.5)
Creatinine, Ser: 1.09 mg/dL (ref 0.50–1.10)
GFR calc Af Amer: 47 mL/min — ABNORMAL LOW (ref >90)
GFR calc non Af Amer: 41 mL/min — ABNORMAL LOW (ref >90)
Glucose, Bld: 100 mg/dL — ABNORMAL HIGH (ref 70–99)
Potassium: 4.2 mEq/L (ref 3.7–5.3)
Sodium: 139 mEq/L (ref 137–147)
Total Protein: 6.7 g/dL (ref 6.0–8.3)

## 2014-02-13 LAB — RAPID URINE DRUG SCREEN, HOSP PERFORMED
AMPHETAMINES: NOT DETECTED
BARBITURATES: NOT DETECTED
Benzodiazepines: NOT DETECTED
Cocaine: NOT DETECTED
Opiates: NOT DETECTED
TETRAHYDROCANNABINOL: NOT DETECTED

## 2014-02-13 LAB — URINALYSIS, ROUTINE W REFLEX MICROSCOPIC
Bilirubin Urine: NEGATIVE
Glucose, UA: NEGATIVE mg/dL
Ketones, ur: NEGATIVE mg/dL
NITRITE: NEGATIVE
PROTEIN: NEGATIVE mg/dL
Specific Gravity, Urine: 1.01 (ref 1.005–1.030)
UROBILINOGEN UA: 0.2 mg/dL (ref 0.0–1.0)
pH: 8.5 — ABNORMAL HIGH (ref 5.0–8.0)

## 2014-02-13 LAB — CBC
HCT: 32.8 % — ABNORMAL LOW (ref 36.0–46.0)
Hemoglobin: 11 g/dL — ABNORMAL LOW (ref 12.0–15.0)
MCH: 32.4 pg (ref 26.0–34.0)
MCHC: 33.5 g/dL (ref 30.0–36.0)
MCV: 96.5 fL (ref 78.0–100.0)
Platelets: 215 10*3/uL (ref 150–400)
RBC: 3.4 MIL/uL — ABNORMAL LOW (ref 3.87–5.11)
RDW: 13.8 % (ref 11.5–15.5)
WBC: 9.9 10*3/uL (ref 4.0–10.5)

## 2014-02-13 LAB — DIFFERENTIAL
BASOS ABS: 0 10*3/uL (ref 0.0–0.1)
Basophils Relative: 0 % (ref 0–1)
Eosinophils Absolute: 0.3 10*3/uL (ref 0.0–0.7)
Eosinophils Relative: 3 % (ref 0–5)
LYMPHS PCT: 15 % (ref 12–46)
Lymphs Abs: 1.4 10*3/uL (ref 0.7–4.0)
MONO ABS: 0.9 10*3/uL (ref 0.1–1.0)
MONOS PCT: 9 % (ref 3–12)
NEUTROS PCT: 73 % (ref 43–77)
Neutro Abs: 7.2 10*3/uL (ref 1.7–7.7)

## 2014-02-13 LAB — PROTIME-INR
INR: 0.99 (ref 0.00–1.49)
Prothrombin Time: 13.1 seconds (ref 11.6–15.2)

## 2014-02-13 LAB — TROPONIN I

## 2014-02-13 LAB — THEOPHYLLINE LEVEL: Theophylline Lvl: 16 ug/mL (ref 10.0–20.0)

## 2014-02-13 LAB — URINE MICROSCOPIC-ADD ON

## 2014-02-13 LAB — ETHANOL: Alcohol, Ethyl (B): <11 mg/dL (ref 0–11)

## 2014-02-13 LAB — APTT: APTT: 30 s (ref 24–37)

## 2014-02-13 NOTE — ED Notes (Signed)
[facility] called and notified of pt d/c instructions. [facility] staff reported would send transportation to pick up pt. [facility] reported not sure how long it will be before pt able to be picked up.

## 2014-02-13 NOTE — Discharge Instructions (Signed)

## 2014-02-13 NOTE — ED Notes (Signed)
Pt here from [facility] for evaluation of weakness and facial drooping.

## 2014-02-13 NOTE — ED Notes (Signed)
Nurse spoke with [name], Med Tech at [facility]. States pt has not been herself since Friday. States she is unsure when pt went to bed last night but around 0730 this morning they noticed the pt had facial drooping and her speech was slurred

## 2014-02-13 NOTE — ED Notes (Signed)
[facility] reported would be to pick up pt in approximately 15 minutes. Charge RN aware.

## 2014-02-13 NOTE — ED Notes (Addendum)
Attempted to obtain IV access x2, unsuccsessful. Lab at bedside collected blood. XRAY at bedside obtaining chest xray at this time.

## 2014-02-13 NOTE — Telephone Encounter (Signed)
Pt's daughter is calling wanting to know why does her mother need to go to [facility]. I told her it was to remove the [place]. She does not understand why we can remove them if we put them in. She is concern because of her mothers age. Please advise

## 2014-02-13 NOTE — ED Notes (Signed)
Pt ambulated in hall. Patient requires 2 person assist to ambulate.

## 2014-02-13 NOTE — ED Provider Notes (Signed)
CSN: [id number]     Arrival date & time 02/13/14  7673 History  This chart was scribed for Sharyon Cable, MD by Molli Posey, ED Scribe. This patient was seen in room APA14/APA14 and the patient's care was started 8:18 AM.   Chief Complaint  Patient presents with  . Extremity Weakness   LEVEL 5 CAVEAT - Altered mental status  Patient is a 80 y.o. female presenting with extremity weakness. The history is provided by the patient and the nursing home. The history is limited by the condition of the patient. No language interpreter was used.  Extremity Weakness The current episode started 1 to 2 hours ago. The problem has not changed since onset.  HPI Comments: STAPHANIE HARBISON is a 80 y.o. female who presents to the Emergency Department complaining of constant generalized weakness that started this morning after waking. She reports that she is weak on 1 side and reports abdominal pain. She denies HA, CP, fever, and vomiting. Nursing staff states that [facility] staff noticed facial weakness and garbled speech this morning and a change in behavior beginning 3 days ago.    Past Medical History  Diagnosis Date  . Urinary retention   . Gastritis   . Esophageal ulcer   . Salmonella sepsis   . Degenerative disk disease   . Spinal stenosis   . Asthma   . COPD (chronic obstructive pulmonary disease)   . Diabetes mellitus, type 2   . Diverticulosis of colon   . GERD (gastroesophageal reflux disease)   . Hypertension   . Osteoarthritis   . Hiatal hernia   . Pancreatitis   . S/P endoscopy 2009    Dr. Oneida Alar: gastritis  . Diverticulitis   . S/P endoscopy March 2012    Dr. Gala Romney: NSAID induced ulcer  . Hypothyroidism   . MGUS (monoclonal gammopathy of unknown significance)    Past Surgical History  Procedure Laterality Date  . Cervical fusion    . Cholecystectomy    . Ercp N/A 02/02/2014    Procedure: ENDOSCOPIC RETROGRADE CHOLANGIOPANCREATOGRAPHY (ERCP), STONE BASKET  EXTRACTION;  Surgeon: Daneil Dolin, MD;  Location: [facility];  Service: Endoscopy;  Laterality: N/A;  . Sphincterotomy N/A 02/02/2014    Procedure: SPHINCTEROTOMY;  Surgeon: Daneil Dolin, MD;  Location: [facility];  Service: Endoscopy;  Laterality: N/A;  . Balloon dilation N/A 02/02/2014    Procedure: BALLOON DILATION;  Surgeon: Daneil Dolin, MD;  Location: [facility];  Service: Endoscopy;  Laterality: N/A;  . Biliary stent placement N/A 02/02/2014    Procedure: BILIARY STENT PLACEMENT;  Surgeon: Daneil Dolin, MD;  Location: [facility];  Service: Endoscopy;  Laterality: N/A;  . Spyglass cholangioscopy N/A 02/02/2014    Procedure: ALPFXTKW CHOLANGIOSCOPY;  Surgeon: Daneil Dolin, MD;  Location: [facility];  Service: Endoscopy;  Laterality: N/A;  . Esophagogastroduodenoscopy N/A 02/05/2014    Procedure: ESOPHAGOGASTRODUODENOSCOPY (EGD);  Surgeon: [name], MD;  Location: [facility];  Service: Endoscopy;  Laterality: N/A;   Family History  Problem Relation Age of Onset  . Diabetes Mother    History  Substance Use Topics  . Smoking status: Never Smoker   . Smokeless tobacco: Never Used  . Alcohol Use: No   OB History   Grav Para Term Preterm Abortions TAB SAB Ect Mult Living                 Review of Systems  Unable to perform ROS: Mental status change  Musculoskeletal: Positive for extremity weakness.      Allergies  Review of patient's allergies indicates no known allergies.  Home Medications   Prior to Admission medications   Medication Sig Start Date End Date Taking? Authorizing Provider  amoxicillin-clavulanate (AUGMENTIN) 500-125 MG per tablet Take 1 tablet (500 mg total) by mouth 2 (two) times daily. 02/04/14   Samuella Cota, MD  aspirin 325 MG tablet Take 1 tablet (325 mg total) by mouth daily. 02/08/14   [name], DO  Calcium Citrate-Vitamin D 200-250 MG-UNIT TABS Take 1 tablet by mouth daily.    Historical Provider, MD  docusate sodium (COLACE) 100 MG capsule Take  1 capsule (100 mg total) by mouth every 12 (twelve) hours. 03/02/13   Johnna Acosta, MD  feeding supplement, ENSURE COMPLETE, (ENSURE COMPLETE) LIQD Take 237 mLs by mouth 4 (four) times daily -  with meals and at bedtime. 02/08/14   [name], DO  ferrous sulfate 325 (65 FE) MG tablet Take 325 mg by mouth daily with breakfast.    Historical Provider, MD  furosemide (LASIX) 40 MG tablet Take 40 mg by mouth daily.     Historical Provider, MD  HYDROcodone-acetaminophen (NORCO/VICODIN) 5-325 MG per tablet Take 1 tablet by mouth every 6 (six) hours as needed for moderate pain. 02/08/14   [name], DO  levETIRAcetam (KEPPRA) 250 MG tablet Take 1 tablet (250 mg total) by mouth daily. 02/09/14   [name], DO  levothyroxine (SYNTHROID, LEVOTHROID) 25 MCG tablet Take 25 mcg by mouth daily before breakfast.    Historical Provider, MD  montelukast (SINGULAIR) 10 MG tablet Take 10 mg by mouth every morning.     Historical Provider, MD  Multiple Vitamin (DAILY VITAMIN PO) Take 1 tablet by mouth daily.    Historical Provider, MD  pantoprazole (PROTONIX) 40 MG tablet Take 40 mg by mouth daily.     Historical Provider, MD  solifenacin (VESICARE) 5 MG tablet Take 5 mg by mouth daily.    Historical Provider, MD  tamsulosin (FLOMAX) 0.4 MG CAPS capsule Take 1 capsule (0.4 mg total) by mouth daily. 02/08/14   [name], DO  theophylline (THEODUR) 200 MG 12 hr tablet Take 200 mg by mouth 2 (two) times daily.     Historical Provider, MD  zolpidem (AMBIEN) 5 MG tablet Take 5 mg by mouth at bedtime as needed for sleep.     Historical Provider, MD   There were no vitals taken for this visit. Physical Exam  Nursing note and vitals reviewed.  CONSTITUTIONAL: Elderly, frail.  HEAD: Normocephalic/atraumatic EYES: EOMI/PERRL ENMT: Mucous membranes moist NECK: supple no meningeal signs SPINE:entire spine nontender CV: S1/S2 noted, no murmurs/rubs/gallops noted LUNGS: Lungs are clear to auscultation  bilaterally, no apparent distress ABDOMEN: soft, nontender, no rebound or guarding HB:ZJIRC cathter in place on arrival NEURO: patient is awake but appears confused, no facial droop, no arm drift, leg drift in both lower extremities  EXTREMITIES: pulses normal, full ROM SKIN: warm, color normal PSYCH: no abnormalities of mood noted  ED Course  Procedures  DIAGNOSTIC STUDIES: Oxygen Saturation is 98% on RA, normal by my interpretation.    COORDINATION OF CARE: 8:25 AM Discussed treatment plan with pt at bedside and pt agreed to plan.  tPA in stroke considered but not given due to:  Onset over 3-4.5hours (LKW last night)  10:01 AM Pt more awake/alert No obvious facial droop Difficult to understand speech though suspect this is due to patient is edentulous No  arm drift She does have weakness in both legs, left >right I spoke to hospitalist dr Ree Kida about this patient She reports pt just in hospital and had MRI about one week ago that showed small infarct She has been seen by neurology She requests neuro consultation 10:18 AM Spoke to dr Merlene Laughter He reports pt did have thorough workup in hospital and would not require further evaluation She should continue ASA 325 daily and keppra  He reports these episodes have been ongoing and similar to prior episodes  I spoke to nurse Butch Penny) at [facility] who reports pt was less responsive this morning and not interactive She reports this is similar to prior episodes when she was recently admitted  11:26 AM Daughter at bedside She reports she is at bedside Pt would like to go back to [facility] She is awake/alert, speechis fluent and she has no arm drift She now has no leg drift She is ambulatory with assistance (uses walker) Appropriate for d/c home  Labs Review Labs Reviewed  CBC - Abnormal; Notable for the following:    RBC 3.40 (*)    Hemoglobin 11.0 (*)    HCT 32.8 (*)    All other components within normal limits   COMPREHENSIVE METABOLIC PANEL - Abnormal; Notable for the following:    Glucose, Bld 100 (*)    Albumin 2.8 (*)    Alkaline Phosphatase 128 (*)    GFR calc non Af Amer 41 (*)    GFR calc Af Amer 47 (*)    All other components within normal limits  URINALYSIS, ROUTINE W REFLEX MICROSCOPIC - Abnormal; Notable for the following:    pH 8.5 (*)    Hgb urine dipstick TRACE (*)    Leukocytes, UA MODERATE (*)    All other components within normal limits  URINE MICROSCOPIC-ADD ON - Abnormal; Notable for the following:    Bacteria, UA FEW (*)    All other components within normal limits  URINE CULTURE  ETHANOL  PROTIME-INR  APTT  DIFFERENTIAL  URINE RAPID DRUG SCREEN (HOSP PERFORMED)  TROPONIN I  THEOPHYLLINE LEVEL    Imaging Review Dg Chest Port 1 View  02/13/2014   CLINICAL DATA:  Weakness and facial drooping for 1-2 hr.  EXAM: PORTABLE CHEST - 1 VIEW  COMPARISON:  Single view of the chest 02/06/2014, 02/01/2014 and 01/22/2008.  FINDINGS: The chest is hyperexpanded but the lungs are clear. Heart size is normal. The aorta is ectatic. No pneumothorax or pleural effusion.  IMPRESSION: No acute disease.   Electronically Signed   By: Inge Rise M.D.   On: 02/13/2014 09:01     EKG Interpretation   Date/Time:  Monday February 13 2014 08:28:48 EDT Ventricular Rate:  81 PR Interval:  175 QRS Duration: 66 QT Interval:  400 QTC Calculation: 464 R Axis:   52 Text Interpretation:  Sinus rhythm Anterior infarct, old Nonspecific T  abnormalities, lateral leads No significant change since last tracing  Confirmed by Christy Gentles  MD, Benjamin Perez ([id number]) on 02/13/2014 8:57:20 AM      MDM   Final diagnoses:  None    Nursing notes including past medical history and social history reviewed and considered in documentation xrays reviewed and considered Labs/vital reviewed and considered   I personally performed the services described in this documentation, which was scribed in my presence.  The recorded information has been reviewed and is accurate.        Sharyon Cable, MD 02/13/14 [phone]

## 2014-02-14 LAB — URINE CULTURE
CULTURE: NO GROWTH
Colony Count: NO GROWTH

## 2014-02-14 NOTE — Telephone Encounter (Signed)
Unable to reposition stent during procedure while inpatient. Due to complexity of case, needs tertiary center.

## 2014-02-15 NOTE — Op Note (Addendum)
[place], [id number]   ENDOSCOPY PROCEDURE REPORT  PATIENT: [name], [name]  MR#: [id number] BIRTHDATE: 10-18-1912 , 77  yrs. old GENDER: female  ENDOSCOPIST: Barney Drain REFERRED PZ:WCHE Hilma Favors, M.D.  Garfield Cornea, M.D.  PROCEDURE DATE: 02/05/2014 PROCEDURE:   EGD w/ biopsy  INDICATIONS:epigastric abdominal pain. MEDICATIONS: Meperidine (Demerol) and Midazolam (Versed) TOPICAL ANESTHETIC:   Viscous Xylocaine ASA CLASS:  DESCRIPTION OF PROCEDURE:     Physical exam was performed.  Informed consent was obtained from the patient after explaining the benefits, risks, and alternatives to the procedure.  The patient was connected to the monitor and placed in the left lateral position.  Continuous oxygen was provided by nasal cannula and IV medicine administered through an indwelling cannula.  After administration of sedation, the patients esophagus was intubated and the     endoscope was advanced under direct visualization to the second portion of the duodenum.  The scope was removed slowly by carefully examining the color, texture, anatomy, and integrity of the mucosa on the way out.  The patient was recovered in endoscopy and discharged home in satisfactory condition.   ESOPHAGUS: A moderately severe Schatzki ring was found at the gastroesophageal junction.   SCOPE TRAUMA IN MID-ESOPHAGUS AND CARDIA. STOMACH: Non-erosive gastritis (inflammation) was found. COLD FORCEPS BIOPSIES OBTAINED. 2.  STENT EMBEDDED IN DIVERTICULUM WITH ULCERATED/ERYTHEMATOUS BASE. UNABLE TO RE-POSITION STENT.  . COMPLICATIONS: There were no immediate complications.  ENDOSCOPIC IMPRESSION: WORSENING ABDOMINAL PAIN/VOMITING AFTER ERCP/STENT PLACEMENT. ETIOLOGY UNCLEAR BUT MAY BE DUE TO URINARY RETENTION SEEN ON CT, LESS LIKELY DUODENAL DIVERTICULITIS, OR UTI  RECOMMENDATIONS: NPO AFTER MN EXCEPT MEDS.  PT MAY NEED A REPEAT ERCP TO REMOVE STENT AND  REPOSITION BID PPI AWAIT BIOPSY ADVANCE DIET CONTINUE AUGMENTIN ZOFRAN QAC REPEAT EXAM: _______________________________ Lorrin MaisBarney Drain, MD 03-09-14 5:23 PMRevised: 03/09/14 5:23 PM  CPT CODES: ICD CODES:  The ICD and CPT codes recommended by this software are interpretations from the data that the clinical staff has captured with the software.  The verification of the translation of this report to the ICD and CPT codes and modifiers is the sole responsibility of the health care institution and practicing physician where this report was generated.  [employer] will not be held responsible for the validity of the ICD and CPT codes included on this report.  AMA assumes no liability for data contained or not contained herein. CPT is a Designer, television/film set of the [employer].

## 2014-02-20 ENCOUNTER — Emergency Department (HOSPITAL_COMMUNITY): Payer: PRIVATE HEALTH INSURANCE

## 2014-02-20 ENCOUNTER — Emergency Department (HOSPITAL_COMMUNITY)
Admission: EM | Admit: 2014-02-20 | Discharge: 2014-02-20 | Disposition: A | Discharge status: Home / Self Care | Payer: PRIVATE HEALTH INSURANCE | Attending: Emergency Medicine | Admitting: Emergency Medicine

## 2014-02-20 ENCOUNTER — Encounter (HOSPITAL_COMMUNITY): Payer: Self-pay | Admitting: Emergency Medicine

## 2014-02-20 DIAGNOSIS — Z8619 Personal history of other infectious and parasitic diseases: Secondary | ICD-10-CM | POA: Insufficient documentation

## 2014-02-20 DIAGNOSIS — J449 Chronic obstructive pulmonary disease, unspecified: Secondary | ICD-10-CM | POA: Diagnosis not present

## 2014-02-20 DIAGNOSIS — Z7982 Long term (current) use of aspirin: Secondary | ICD-10-CM | POA: Diagnosis not present

## 2014-02-20 DIAGNOSIS — R1013 Epigastric pain: Secondary | ICD-10-CM | POA: Insufficient documentation

## 2014-02-20 DIAGNOSIS — F0390 Unspecified dementia without behavioral disturbance: Secondary | ICD-10-CM | POA: Insufficient documentation

## 2014-02-20 DIAGNOSIS — E119 Type 2 diabetes mellitus without complications: Secondary | ICD-10-CM | POA: Diagnosis not present

## 2014-02-20 DIAGNOSIS — E039 Hypothyroidism, unspecified: Secondary | ICD-10-CM | POA: Insufficient documentation

## 2014-02-20 DIAGNOSIS — I10 Essential (primary) hypertension: Secondary | ICD-10-CM | POA: Insufficient documentation

## 2014-02-20 DIAGNOSIS — K219 Gastro-esophageal reflux disease without esophagitis: Secondary | ICD-10-CM | POA: Diagnosis not present

## 2014-02-20 DIAGNOSIS — Z79899 Other long term (current) drug therapy: Secondary | ICD-10-CM | POA: Diagnosis not present

## 2014-02-20 DIAGNOSIS — Z792 Long term (current) use of antibiotics: Secondary | ICD-10-CM | POA: Insufficient documentation

## 2014-02-20 LAB — CBC WITH DIFFERENTIAL/PLATELET
BASOS ABS: 0 10*3/uL (ref 0.0–0.1)
BASOS PCT: 1 % (ref 0–1)
Eosinophils Absolute: 0.3 10*3/uL (ref 0.0–0.7)
Eosinophils Relative: 4 % (ref 0–5)
HCT: 34.5 % — ABNORMAL LOW (ref 36.0–46.0)
Hemoglobin: 11.6 g/dL — ABNORMAL LOW (ref 12.0–15.0)
LYMPHS PCT: 25 % (ref 12–46)
Lymphs Abs: 1.9 10*3/uL (ref 0.7–4.0)
MCH: 32.7 pg (ref 26.0–34.0)
MCHC: 33.6 g/dL (ref 30.0–36.0)
MCV: 97.2 fL (ref 78.0–100.0)
MONO ABS: 0.8 10*3/uL (ref 0.1–1.0)
Monocytes Relative: 11 % (ref 3–12)
NEUTROS ABS: 4.6 10*3/uL (ref 1.7–7.7)
NEUTROS PCT: 59 % (ref 43–77)
Platelets: 284 10*3/uL (ref 150–400)
RBC: 3.55 MIL/uL — ABNORMAL LOW (ref 3.87–5.11)
RDW: 14.1 % (ref 11.5–15.5)
WBC: 7.6 10*3/uL (ref 4.0–10.5)

## 2014-02-20 LAB — COMPREHENSIVE METABOLIC PANEL
ALBUMIN: 3.2 g/dL — AB (ref 3.5–5.2)
ALT: 9 U/L (ref 0–35)
AST: 16 U/L (ref 0–37)
Alkaline Phosphatase: 106 U/L (ref 39–117)
Anion gap: 12 (ref 5–15)
BILIRUBIN TOTAL: 0.2 mg/dL — AB (ref 0.3–1.2)
BUN: 39 mg/dL — ABNORMAL HIGH (ref 6–23)
CHLORIDE: 103 meq/L (ref 96–112)
CO2: 28 mEq/L (ref 19–32)
Calcium: 9.7 mg/dL (ref 8.4–10.5)
Creatinine, Ser: 1.14 mg/dL — ABNORMAL HIGH (ref 0.50–1.10)
GFR calc Af Amer: 45 mL/min — ABNORMAL LOW (ref >90)
GFR calc non Af Amer: 39 mL/min — ABNORMAL LOW (ref >90)
Glucose, Bld: 84 mg/dL (ref 70–99)
Potassium: 4.1 mEq/L (ref 3.7–5.3)
SODIUM: 143 meq/L (ref 137–147)
Total Protein: 7.2 g/dL (ref 6.0–8.3)

## 2014-02-20 LAB — LIPASE, BLOOD: LIPASE: 16 U/L (ref 11–59)

## 2014-02-20 NOTE — ED Notes (Signed)
Patient unsure of last BM date, but states it was in the last several days. States stool was hard and not regular.

## 2014-02-20 NOTE — ED Notes (Signed)
Patient from [facility]. EMS originally called for altered mental status. Patient told [facility] staff, "Help me" repeatedly. Patient able to answer questions appropriately on arrival. Complains of abdominal pain and possible constipation.

## 2014-02-20 NOTE — ED Provider Notes (Signed)
CSN: [id number]     Arrival date & time 02/20/14  0601 History  This chart was scribed for [name], MD by Rayfield Citizen, ED Scribe. This patient was seen in room APA02/APA02 and the patient's care was started at 7:25 AM.   **Level Five Caveat; Mild Dementia    Chief Complaint  Patient presents with  . Abdominal Pain   The history is provided by the patient. No language interpreter was used.    HPI Comments: [name] is a 80 y.o. female who presents to the Emergency Department complaining of gradual onset abdominal pain; she explains (via demonstration during physical exam) that it was previously generalized but now seems to be centralized in her epigastrium. She states that she has been eating and that her last bowel movement was within the last "day or so." She denies vomiting or diarrhea.    Past Medical History  Diagnosis Date  . Urinary retention   . Gastritis   . Esophageal ulcer   . Salmonella sepsis   . Degenerative disk disease   . Spinal stenosis   . Asthma   . COPD (chronic obstructive pulmonary disease)   . Diabetes mellitus, type 2   . Diverticulosis of colon   . GERD (gastroesophageal reflux disease)   . Hypertension   . Osteoarthritis   . Hiatal hernia   . Pancreatitis   . S/P endoscopy 2009    Dr. Oneida Alar: gastritis  . Diverticulitis   . S/P endoscopy March 2012    Dr. Gala Romney: NSAID induced ulcer  . Hypothyroidism   . MGUS (monoclonal gammopathy of unknown significance)    Past Surgical History  Procedure Laterality Date  . Cervical fusion    . Cholecystectomy    . Ercp N/A 02/02/2014    Procedure: ENDOSCOPIC RETROGRADE CHOLANGIOPANCREATOGRAPHY (ERCP), STONE BASKET EXTRACTION;  Surgeon: Daneil Dolin, MD;  Location: [facility];  Service: Endoscopy;  Laterality: N/A;  . Sphincterotomy N/A 02/02/2014    Procedure: SPHINCTEROTOMY;  Surgeon: Daneil Dolin, MD;  Location: [facility];  Service: Endoscopy;  Laterality: N/A;  . Balloon dilation N/A 02/02/2014     Procedure: BALLOON DILATION;  Surgeon: Daneil Dolin, MD;  Location: [facility];  Service: Endoscopy;  Laterality: N/A;  . Biliary stent placement N/A 02/02/2014    Procedure: BILIARY STENT PLACEMENT;  Surgeon: Daneil Dolin, MD;  Location: [facility];  Service: Endoscopy;  Laterality: N/A;  . Spyglass cholangioscopy N/A 02/02/2014    Procedure: XFGHWEXH CHOLANGIOSCOPY;  Surgeon: Daneil Dolin, MD;  Location: [facility];  Service: Endoscopy;  Laterality: N/A;  . Esophagogastroduodenoscopy N/A 02/05/2014    Procedure: ESOPHAGOGASTRODUODENOSCOPY (EGD);  Surgeon: [name], MD;  [facility]: [facility];  Service: Endoscopy;  Laterality: N/A;   Family History  Problem Relation Age of Onset  . Diabetes Mother    History  Substance Use Topics  . Smoking status: Never Smoker   . Smokeless tobacco: Never Used  . Alcohol Use: No   OB History   Grav Para Term Preterm Abortions TAB SAB Ect Mult Living                 Review of Systems  Gastrointestinal: Positive for abdominal pain. Negative for nausea and diarrhea.  Psychiatric/Behavioral: Positive for confusion (Mild dementia ).  All other systems reviewed and are negative.   Allergies  Review of patient's allergies indicates no known allergies.  Home Medications   Prior to Admission medications   Medication Sig Start  Date End Date Taking? Authorizing Provider  amoxicillin-clavulanate (AUGMENTIN) 500-125 MG per tablet Take 1 tablet (500 mg total) by mouth 2 (two) times daily. 02/04/14   Samuella Cota, MD  aspirin 325 MG tablet Take 1 tablet (325 mg total) by mouth daily. 02/08/14   [name], DO  Calcium Citrate-Vitamin D 200-250 MG-UNIT TABS Take 1 tablet by mouth daily.    Historical Provider, MD  docusate sodium (COLACE) 100 MG capsule Take 1 capsule (100 mg total) by mouth every 12 (twelve) hours. 03/02/13   Johnna Acosta, MD  feeding supplement, ENSURE COMPLETE, (ENSURE COMPLETE) LIQD Take 237 mLs by mouth 4 (four) times  daily -  with meals and at bedtime. 02/08/14   [name], DO  ferrous sulfate 325 (65 FE) MG tablet Take 325 mg by mouth daily with breakfast.    Historical Provider, MD  furosemide (LASIX) 40 MG tablet Take 40 mg by mouth daily.     Historical Provider, MD  HYDROcodone-acetaminophen (NORCO/VICODIN) 5-325 MG per tablet Take 1 tablet by mouth every 6 (six) hours as needed for moderate pain. 02/08/14   [name], DO  levETIRAcetam (KEPPRA) 250 MG tablet Take 1 tablet (250 mg total) by mouth daily. 02/09/14   [name], DO  levothyroxine (SYNTHROID, LEVOTHROID) 25 MCG tablet Take 25 mcg by mouth daily before breakfast.    Historical Provider, MD  montelukast (SINGULAIR) 10 MG tablet Take 10 mg by mouth every morning.     Historical Provider, MD  Multiple Vitamin (DAILY VITAMIN PO) Take 1 tablet by mouth daily.    Historical Provider, MD  pantoprazole (PROTONIX) 40 MG tablet Take 40 mg by mouth daily.     Historical Provider, MD  solifenacin (VESICARE) 5 MG tablet Take 5 mg by mouth daily.    Historical Provider, MD  tamsulosin (FLOMAX) 0.4 MG CAPS capsule Take 1 capsule (0.4 mg total) by mouth daily. 02/08/14   [name], DO  theophylline (THEODUR) 200 MG 12 hr tablet Take 200 mg by mouth 2 (two) times daily.     Historical Provider, MD  zolpidem (AMBIEN) 5 MG tablet Take 5 mg by mouth at bedtime as needed for sleep.     Historical Provider, MD   BP 128/56  Pulse 67  Temp(Src) 97.6 F (36.4 C) (Oral)  Resp 16  Wt 105 lb (47.628 kg)  SpO2 96% Physical Exam  Nursing note and vitals reviewed. Constitutional: She appears well-developed and well-nourished.  HENT:  Head: Normocephalic and atraumatic.  Eyes: Conjunctivae and EOM are normal. Pupils are equal, round, and reactive to light.  Neck: Normal range of motion. Neck supple.  Cardiovascular: Normal rate, regular rhythm and normal heart sounds.   Pulmonary/Chest: Effort normal and breath sounds normal.  Abdominal: Soft.  Bowel sounds are normal.  Previously generalized abdominal pain, now centralized in the epigastrium  Musculoskeletal: Normal range of motion.  Neurological: She is alert.  Mild dementia; knows name, city, month, recognizes location [facility])   Skin: Skin is warm and dry.  Psychiatric: She has a normal mood and affect. Her behavior is normal.    ED Course  Procedures  DIAGNOSTIC STUDIES: Oxygen Saturation is  96% which is normal by my interpretation  COORDINATION OF CARE: 7:28 AM Discussed treatment plan with pt at bedside and pt agreed to plan.    Labs Review Labs Reviewed  CBC WITH DIFFERENTIAL - Abnormal; Notable for the following:    RBC 3.55 (*)    Hemoglobin 11.6 (*)  HCT 34.5 (*)    All other components within normal limits  COMPREHENSIVE METABOLIC PANEL - Abnormal; Notable for the following:    BUN 39 (*)    Creatinine, Ser 1.14 (*)    Albumin 3.2 (*)    Total Bilirubin 0.2 (*)    GFR calc non Af Amer 39 (*)    GFR calc Af Amer 45 (*)    All other components within normal limits  LIPASE, BLOOD  URINALYSIS, ROUTINE W REFLEX MICROSCOPIC    Imaging Review Dg Abd Acute W/chest  02/20/2014   CLINICAL DATA:  Altered mental status. Abdominal pain and possible constipation.  EXAM: ACUTE ABDOMEN SERIES (ABDOMEN 2 VIEW & CHEST 1 VIEW)  COMPARISON:  Chest radiograph 02/13/2014 and CT abdomen pelvis 02/06/2014.  FINDINGS: Frontal view of the chest shows midline trachea and stable heart size. Lungs appear emphysematous but clear.  Two views of the abdomen show a fair amount of stool in the colon. No definite small bowel dilatation. Surgical clips in the right upper quadrant. A biliary stent is seen in the right abdomen.  IMPRESSION: 1. Fair amount of stool in the colon is indicative of constipation. 2. Biliary stent in the right abdomen, better evaluated on 02/06/2014. 3. No acute findings in the chest.   Electronically Signed   By: Lorin Picket M.D.   On: 02/20/2014 07:25      EKG Interpretation None      MDM   Final diagnoses:  Epigastric pain   Patient is in no acute distress.  No acute abdomen. White count normal. Lipase normal. Acute abdominal series shows excessive amount of stool. Suspect constipation as her primary problem  I personally performed the services described in this documentation, which was scribed in my presence. The recorded information has been reviewed and is accurate.      [name], MD 02/20/14 [phone]

## 2014-02-20 NOTE — Telephone Encounter (Signed)
Talked with Rip Harbour from [place]. She is aware of why patient is going to [place],

## 2014-02-20 NOTE — ED Notes (Signed)
Attempted in and out fem cath x1, no output.

## 2014-02-20 NOTE — ED Notes (Signed)
Patient with no complaints at this time. Respirations even and unlabored. Skin warm/dry. Discharge instructions reviewed with patient at this time. Patient given opportunity to voice concerns/ask questions. Patient discharged at this time and left Emergency Department via wheelchair. 

## 2014-02-20 NOTE — ED Notes (Signed)
Report called to [facility] LPN. Informs RN that ride will pick patient up in 30-45 minutes.

## 2014-02-20 NOTE — Discharge Instructions (Signed)
Test show stool in the colon.  Mrs. Vaccarella will need an enema or medication for constipation.  White count was normal. Lipase was normal. Followup with primary care Dr.

## 2014-03-09 ENCOUNTER — Telehealth: Payer: Self-pay | Admitting: Gastroenterology

## 2014-03-09 NOTE — Telephone Encounter (Signed)
PLEASE CALL PT'S FAMILY. HER stomach Bx shows mild gastritis. Continue PROTONIX DAILY. OPV AT [facility] TO EXCHANGE BILI DUCT STENT.

## 2014-03-09 NOTE — Telephone Encounter (Signed)
[name] with [employer] is aware of results. Pt's appointment is next Friday

## 2014-03-15 ENCOUNTER — Telehealth: Payer: Self-pay | Admitting: Gastroenterology

## 2014-03-15 NOTE — Telephone Encounter (Signed)
Received call from [facility]. They are recommending to hold off on stent exchange at this time due to patient age and comorbidities. This was relayed to me by Alta Corning, social worker with [employer].

## 2014-04-07 NOTE — Telephone Encounter (Signed)
REVIEWED. CC: DR. Gala Romney.

## 2014-05-24 DIAGNOSIS — I69951 Hemiplegia and hemiparesis following unspecified cerebrovascular disease affecting right dominant side: Secondary | ICD-10-CM | POA: Diagnosis not present

## 2014-05-24 DIAGNOSIS — K8032 Calculus of bile duct with acute cholangitis without obstruction: Secondary | ICD-10-CM | POA: Diagnosis not present

## 2014-05-24 DIAGNOSIS — R339 Retention of urine, unspecified: Secondary | ICD-10-CM | POA: Diagnosis not present

## 2014-05-24 DIAGNOSIS — I10 Essential (primary) hypertension: Secondary | ICD-10-CM | POA: Diagnosis not present

## 2014-06-09 ENCOUNTER — Emergency Department (HOSPITAL_COMMUNITY): Payer: Medicare Other

## 2014-06-09 ENCOUNTER — Encounter (HOSPITAL_COMMUNITY): Payer: Self-pay | Admitting: *Deleted

## 2014-06-09 ENCOUNTER — Emergency Department (HOSPITAL_COMMUNITY)
Admission: EM | Admit: 2014-06-09 | Discharge: 2014-06-09 | Disposition: A | Discharge status: Home / Self Care | Payer: Medicare Other | Attending: Emergency Medicine | Admitting: Emergency Medicine

## 2014-06-09 DIAGNOSIS — Z9049 Acquired absence of other specified parts of digestive tract: Secondary | ICD-10-CM | POA: Diagnosis not present

## 2014-06-09 DIAGNOSIS — Z8619 Personal history of other infectious and parasitic diseases: Secondary | ICD-10-CM | POA: Insufficient documentation

## 2014-06-09 DIAGNOSIS — J449 Chronic obstructive pulmonary disease, unspecified: Secondary | ICD-10-CM | POA: Diagnosis not present

## 2014-06-09 DIAGNOSIS — Z792 Long term (current) use of antibiotics: Secondary | ICD-10-CM | POA: Diagnosis not present

## 2014-06-09 DIAGNOSIS — Z9889 Other specified postprocedural states: Secondary | ICD-10-CM | POA: Insufficient documentation

## 2014-06-09 DIAGNOSIS — R197 Diarrhea, unspecified: Secondary | ICD-10-CM | POA: Diagnosis not present

## 2014-06-09 DIAGNOSIS — I10 Essential (primary) hypertension: Secondary | ICD-10-CM | POA: Diagnosis not present

## 2014-06-09 DIAGNOSIS — M1990 Unspecified osteoarthritis, unspecified site: Secondary | ICD-10-CM | POA: Insufficient documentation

## 2014-06-09 DIAGNOSIS — K219 Gastro-esophageal reflux disease without esophagitis: Secondary | ICD-10-CM | POA: Insufficient documentation

## 2014-06-09 DIAGNOSIS — R1013 Epigastric pain: Secondary | ICD-10-CM | POA: Insufficient documentation

## 2014-06-09 DIAGNOSIS — R52 Pain, unspecified: Secondary | ICD-10-CM

## 2014-06-09 DIAGNOSIS — Z862 Personal history of diseases of the blood and blood-forming organs and certain disorders involving the immune mechanism: Secondary | ICD-10-CM | POA: Diagnosis not present

## 2014-06-09 DIAGNOSIS — E039 Hypothyroidism, unspecified: Secondary | ICD-10-CM | POA: Diagnosis not present

## 2014-06-09 DIAGNOSIS — Z7982 Long term (current) use of aspirin: Secondary | ICD-10-CM | POA: Insufficient documentation

## 2014-06-09 DIAGNOSIS — E119 Type 2 diabetes mellitus without complications: Secondary | ICD-10-CM | POA: Diagnosis not present

## 2014-06-09 DIAGNOSIS — R404 Transient alteration of awareness: Secondary | ICD-10-CM | POA: Diagnosis not present

## 2014-06-09 DIAGNOSIS — R109 Unspecified abdominal pain: Secondary | ICD-10-CM

## 2014-06-09 DIAGNOSIS — Z79899 Other long term (current) drug therapy: Secondary | ICD-10-CM | POA: Insufficient documentation

## 2014-06-09 DIAGNOSIS — Z743 Need for continuous supervision: Secondary | ICD-10-CM | POA: Diagnosis not present

## 2014-06-09 DIAGNOSIS — K2970 Gastritis, unspecified, without bleeding: Secondary | ICD-10-CM | POA: Diagnosis not present

## 2014-06-09 LAB — CBC WITH DIFFERENTIAL/PLATELET
Basophils Absolute: 0 10*3/uL (ref 0.0–0.1)
Basophils Relative: 0 % (ref 0–1)
EOS PCT: 2 % (ref 0–5)
Eosinophils Absolute: 0.2 10*3/uL (ref 0.0–0.7)
HCT: 33.3 % — ABNORMAL LOW (ref 36.0–46.0)
Hemoglobin: 11 g/dL — ABNORMAL LOW (ref 12.0–15.0)
LYMPHS PCT: 24 % (ref 12–46)
Lymphs Abs: 1.6 10*3/uL (ref 0.7–4.0)
MCH: 32.4 pg (ref 26.0–34.0)
MCHC: 33 g/dL (ref 30.0–36.0)
MCV: 97.9 fL (ref 78.0–100.0)
MONOS PCT: 9 % (ref 3–12)
Monocytes Absolute: 0.6 10*3/uL (ref 0.1–1.0)
NEUTROS ABS: 4.4 10*3/uL (ref 1.7–7.7)
NEUTROS PCT: 65 % (ref 43–77)
Platelets: 166 10*3/uL (ref 150–400)
RBC: 3.4 MIL/uL — ABNORMAL LOW (ref 3.87–5.11)
RDW: 15 % (ref 11.5–15.5)
WBC: 6.9 10*3/uL (ref 4.0–10.5)

## 2014-06-09 LAB — LIPASE, BLOOD: LIPASE: 17 U/L (ref 11–59)

## 2014-06-09 LAB — COMPREHENSIVE METABOLIC PANEL
ALT: 13 U/L (ref 0–35)
ANION GAP: 3 — AB (ref 5–15)
AST: 25 U/L (ref 0–37)
Albumin: 3.1 g/dL — ABNORMAL LOW (ref 3.5–5.2)
Alkaline Phosphatase: 62 U/L (ref 39–117)
BILIRUBIN TOTAL: 0.3 mg/dL (ref 0.3–1.2)
BUN: 41 mg/dL — ABNORMAL HIGH (ref 6–23)
CO2: 30 mmol/L (ref 19–32)
CREATININE: 1.16 mg/dL — AB (ref 0.50–1.10)
Calcium: 8.6 mg/dL (ref 8.4–10.5)
Chloride: 106 mEq/L (ref 96–112)
GFR calc Af Amer: 44 mL/min — ABNORMAL LOW (ref >90)
GFR calc non Af Amer: 38 mL/min — ABNORMAL LOW (ref >90)
Glucose, Bld: 84 mg/dL (ref 70–99)
Potassium: 4 mmol/L (ref 3.5–5.1)
SODIUM: 139 mmol/L (ref 135–145)
Total Protein: 5.7 g/dL — ABNORMAL LOW (ref 6.0–8.3)

## 2014-06-09 LAB — URINALYSIS, ROUTINE W REFLEX MICROSCOPIC
Bilirubin Urine: NEGATIVE
Glucose, UA: NEGATIVE mg/dL
Hgb urine dipstick: NEGATIVE
KETONES UR: NEGATIVE mg/dL
Leukocytes, UA: NEGATIVE
NITRITE: NEGATIVE
PH: 5.5 (ref 5.0–8.0)
Protein, ur: NEGATIVE mg/dL
Specific Gravity, Urine: <1.005 — ABNORMAL LOW (ref 1.005–1.030)
UROBILINOGEN UA: 0.2 mg/dL (ref 0.0–1.0)

## 2014-06-09 LAB — TROPONIN I: Troponin I: <0.03 ng/mL (ref <0.031)

## 2014-06-09 MED ORDER — DICYCLOMINE HCL 20 MG PO TABS: ORAL_TABLET | ORAL | Status: DC

## 2014-06-09 MED ORDER — ONDANSETRON HCL 4 MG/2ML IJ SOLN
4.0000 mg | Freq: Once | INTRAMUSCULAR | Status: AC
  Administered 2014-06-09: 4 mg via INTRAVENOUS
  Filled 2014-06-09: qty 2

## 2014-06-09 MED ORDER — PANTOPRAZOLE SODIUM 40 MG IV SOLR
40.0000 mg | Freq: Once | INTRAVENOUS | Status: AC
  Administered 2014-06-09: 40 mg via INTRAVENOUS
  Filled 2014-06-09: qty 40

## 2014-06-09 MED ORDER — IOHEXOL 300 MG/ML  SOLN
100.0000 mL | Freq: Once | INTRAMUSCULAR | Status: AC | PRN
  Administered 2014-06-09: 80 mL via INTRAVENOUS

## 2014-06-09 NOTE — Discharge Instructions (Signed)
Follow up with your GI MD,  Dr. Oneida Alar next week

## 2014-06-09 NOTE — ED Notes (Signed)
Pt alert & oriented x4, stable gait. Patient given discharge instructions, paperwork & prescription(s). Patient  instructed to stop at the registration desk to finish any additional paperwork. Patient verbalized understanding. Pt left department w/ no further questions. 

## 2014-06-09 NOTE — ED Provider Notes (Signed)
CSN: [id number]     Arrival date & time 06/09/14  6295 History   First MD Initiated Contact with Patient 06/09/14 [phone]     Chief Complaint  Patient presents with  . Abdominal Pain     (Consider location/radiation/quality/duration/timing/severity/associated sxs/prior Treatment) Patient is a 81 y.o. female presenting with abdominal pain. The history is provided by the patient (the pt complains of mild abdominal pain).  Abdominal Pain Pain location:  Epigastric Pain quality: aching   Pain radiates to:  Does not radiate Pain severity:  Mild Onset quality:  Gradual Timing:  Intermittent Progression:  Waxing and waning Chronicity:  Recurrent Context: not alcohol use   Associated symptoms: no chest pain, no cough, no diarrhea, no fatigue and no hematuria     Past Medical History  Diagnosis Date  . Urinary retention   . Gastritis   . Esophageal ulcer   . Salmonella sepsis   . Degenerative disk disease   . Spinal stenosis   . Asthma   . COPD (chronic obstructive pulmonary disease)   . Diabetes mellitus, type 2   . Diverticulosis of colon   . GERD (gastroesophageal reflux disease)   . Hypertension   . Osteoarthritis   . Hiatal hernia   . Pancreatitis   . S/P endoscopy 2009    Dr. Oneida Alar: gastritis  . Diverticulitis   . S/P endoscopy March 2012    Dr. Gala Romney: NSAID induced ulcer  . Hypothyroidism   . MGUS (monoclonal gammopathy of unknown significance)    Past Surgical History  Procedure Laterality Date  . Cervical fusion    . Cholecystectomy    . Ercp N/A 02/02/2014    Procedure: ENDOSCOPIC RETROGRADE CHOLANGIOPANCREATOGRAPHY (ERCP), STONE BASKET EXTRACTION;  Surgeon: Daneil Dolin, MD;  Location: [facility];  Service: Endoscopy;  Laterality: N/A;  . Sphincterotomy N/A 02/02/2014    Procedure: SPHINCTEROTOMY;  Surgeon: Daneil Dolin, MD;  Location: [facility];  Service: Endoscopy;  Laterality: N/A;  . Balloon dilation N/A 02/02/2014    Procedure: BALLOON DILATION;  Surgeon:  Daneil Dolin, MD;  Location: [facility];  Service: Endoscopy;  Laterality: N/A;  . Biliary stent placement N/A 02/02/2014    Procedure: BILIARY STENT PLACEMENT;  Surgeon: Daneil Dolin, MD;  Location: [facility];  Service: Endoscopy;  Laterality: N/A;  . Spyglass cholangioscopy N/A 02/02/2014    Procedure: LKGMWNUU CHOLANGIOSCOPY;  Surgeon: Daneil Dolin, MD;  Location: [facility];  Service: Endoscopy;  Laterality: N/A;  . Esophagogastroduodenoscopy N/A 02/05/2014    Procedure: ESOPHAGOGASTRODUODENOSCOPY (EGD);  Surgeon: [name], MD;  [facility];  Service: Endoscopy;  Laterality: N/A;   Family History  Problem Relation Age of Onset  . Diabetes Mother    History  Substance Use Topics  . Smoking status: Never Smoker   . Smokeless tobacco: Never Used  . Alcohol Use: No   OB History    No data available     Review of Systems  Constitutional: Negative for appetite change and fatigue.  HENT: Negative for congestion, ear discharge and sinus pressure.   Eyes: Negative for discharge.  Respiratory: Negative for cough.   Cardiovascular: Negative for chest pain.  Gastrointestinal: Positive for abdominal pain. Negative for diarrhea.  Genitourinary: Negative for frequency and hematuria.  Musculoskeletal: Negative for back pain.  Skin: Negative for rash.  Neurological: Negative for seizures and headaches.  Psychiatric/Behavioral: Negative for hallucinations.      Allergies  Review of patient's allergies indicates no known allergies.  Home Medications   Prior to Admission medications   Medication Sig Start Date End Date Taking? Authorizing Provider  aspirin 325 MG tablet Take 1 tablet (325 mg total) by mouth daily. 02/08/14  Yes [name], DO  Calcium Citrate-Vitamin D 200-250 MG-UNIT TABS Take 1 tablet by mouth daily.   Yes Historical Provider, MD  docusate sodium (COLACE) 100 MG capsule Take 1 capsule (100 mg total) by mouth every 12 (twelve) hours. 03/02/13  Yes Johnna Acosta, MD  ferrous sulfate 325 (65 FE) MG tablet Take 325 mg by mouth daily with breakfast.   Yes Historical Provider, MD  furosemide (LASIX) 40 MG tablet Take 40 mg by mouth daily.    Yes Historical Provider, MD  levETIRAcetam (KEPPRA) 250 MG tablet Take 1 tablet (250 mg total) by mouth daily. Patient taking differently: Take 250 mg by mouth at bedtime.  02/09/14  Yes [name], DO  levothyroxine (SYNTHROID, LEVOTHROID) 25 MCG tablet Take 25 mcg by mouth daily before breakfast.   Yes Historical Provider, MD  montelukast (SINGULAIR) 10 MG tablet Take 10 mg by mouth every morning.    Yes Historical Provider, MD  Multiple Vitamin (DAILY VITAMIN PO) Take 1 tablet by mouth daily.   Yes Historical Provider, MD  pantoprazole (PROTONIX) 40 MG tablet Take 40 mg by mouth daily.    Yes Historical Provider, MD  polyethylene glycol (MIRALAX / GLYCOLAX) packet Take 17 g by mouth daily as needed for mild constipation.   Yes Historical Provider, MD  solifenacin (VESICARE) 5 MG tablet Take 5 mg by mouth daily.   Yes Historical Provider, MD  tamsulosin (FLOMAX) 0.4 MG CAPS capsule Take 1 capsule (0.4 mg total) by mouth daily. Patient taking differently: Take 0.4 mg by mouth daily after supper.  02/08/14  Yes [name], DO  theophylline (THEODUR) 200 MG 12 hr tablet Take 200 mg by mouth 2 (two) times daily.    Yes Historical Provider, MD  dicyclomine (BENTYL) 20 MG tablet Take one pill every 8 hours for abd cramps 06/09/14   Maudry Diego, MD  feeding supplement, ENSURE COMPLETE, (ENSURE COMPLETE) LIQD Take 237 mLs by mouth 4 (four) times daily -  with meals and at bedtime. Patient not taking: Reported on 06/09/2014 02/08/14   Cristal Ford, DO  HYDROcodone-acetaminophen (NORCO/VICODIN) 5-325 MG per tablet Take 1 tablet by mouth every 6 (six) hours as needed for moderate pain. 02/08/14   [name], DO  zolpidem (AMBIEN) 5 MG tablet Take 5 mg by mouth at bedtime as needed for sleep.     Historical  Provider, MD   BP 125/64 mmHg  Pulse 67  Temp(Src) 97.7 F (36.5 C) (Oral)  Resp 16  Ht 5' (1.524 m)  Wt 100 lb (45.36 kg)  BMI 19.53 kg/m2  SpO2 100% Physical Exam  Constitutional: She is oriented to person, place, and time. She appears well-developed.  HENT:  Head: Normocephalic.  Eyes: Conjunctivae and EOM are normal. No scleral icterus.  Neck: Neck supple. No thyromegaly present.  Cardiovascular: Normal rate and regular rhythm.  Exam reveals no gallop and no friction rub.   No murmur heard. Pulmonary/Chest: No stridor. She has no wheezes. She has no rales. She exhibits no tenderness.  Abdominal: She exhibits no distension. There is tenderness. There is no rebound.  Mild tenderness throughout abd  Musculoskeletal: Normal range of motion. She exhibits no edema.  Lymphadenopathy:    She has no cervical adenopathy.  Neurological: She is oriented to person,  place, and time. She exhibits normal muscle tone. Coordination normal.  Skin: No rash noted. No erythema.  Psychiatric: She has a normal mood and affect. Her behavior is normal.    ED Course  Procedures (including critical care time) Labs Review Labs Reviewed  CBC WITH DIFFERENTIAL - Abnormal; Notable for the following:    RBC 3.40 (*)    Hemoglobin 11.0 (*)    HCT 33.3 (*)    All other components within normal limits  COMPREHENSIVE METABOLIC PANEL - Abnormal; Notable for the following:    BUN 41 (*)    Creatinine, Ser 1.16 (*)    Total Protein 5.7 (*)    Albumin 3.1 (*)    GFR calc non Af Amer 38 (*)    GFR calc Af Amer 44 (*)    Anion gap 3 (*)    All other components within normal limits  URINALYSIS, ROUTINE W REFLEX MICROSCOPIC - Abnormal; Notable for the following:    Specific Gravity, Urine <1.005 (*)    All other components within normal limits  LIPASE, BLOOD  TROPONIN I    Imaging Review Ct Abdomen Pelvis W Contrast  06/09/2014   CLINICAL DATA:  Worsening generalized abdominal pain. History of  choledocholithiasis with prior endoscopic stone retraction and biliary stent placement.  EXAM: CT ABDOMEN AND PELVIS WITH CONTRAST  TECHNIQUE: Multidetector CT imaging of the abdomen and pelvis was performed using the standard protocol following bolus administration of intravenous contrast.  CONTRAST:  74mL OMNIPAQUE IOHEXOL 300 MG/ML  SOLN  COMPARISON:  02/06/2014  FINDINGS: The indwelling plastic biliary stent within the common bile duct shows stable positioning. There may be relative stent occlusion based on the fact that pneumobilia is no longer present compared to the prior CT and there is evidence of biliary dilatation. The common bile duct measures 13 mm just proximal to the proximal end of the stent. There also is evidence of intrahepatic biliary ductal dilatation. Correlation suggested with liver function tests. No calculi are identified in the biliary tree.  The gallbladder has been removed. The pancreas is atrophic and has a stable appearance. The spleen, adrenal glands and kidneys are unremarkable. Sparse air is present in small bowel loops which are nondilated. The colon is unremarkable. No inflammatory process or abnormal fluid collection is identified. The bladder is moderately distended. No masses or enlarged lymph nodes are seen. No hernias are identified.  The spine shows stable advanced spondylosis of the lower lumbar spine with stable anterolisthesis of L3 on L4 and L4 on L5. No bony lesions are seen. Stable bibasilar scarring. Previously noted small bilateral pleural effusions have resolved.  IMPRESSION: There may be relative occlusion of the common bile duct stent based on lack of pneumobilia which was seen throughout the liver and extrahepatic biliary tree on the prior CT. The common duct measures 13 mm in greatest diameter just proximal to the stent. No evidence of recurrent choledocholithiasis by CT.   Electronically Signed   By: Aletta Edouard M.D.   On: 06/09/2014 14:58     EKG  Interpretation   Date/Time:  Friday June 09 2014 09:22:32 EST Ventricular Rate:  73 PR Interval:  174 QRS Duration: 75 QT Interval:  402 QTC Calculation: 443 R Axis:   48 Text Interpretation:  Sinus rhythm Anterior infarct, old Minimal ST  depression, lateral leads ST elevation, consider inferior injury Confirmed  by Mitsue Peery  MD, Kailie Polus [phone]) on 06/09/2014 9:40:44 AM Also confirmed by  Anyeli Hockenbury  MD, Chace Klippel [phone])  on 06/09/2014 12:57:31 PM Also confirmed by  Zohan Shiflet  MD, Broadus John [id number])  on 06/09/2014 1:21:10 PM      MDM   Final diagnoses:  Pain  Abdominal pain in female patient        Maudry Diego, MD 06/09/14 1537

## 2014-06-09 NOTE — ED Notes (Signed)
Spoke to [place] at [facility] regarding bring pt back. [facility] states they can not come to get due to the weather. EMS called to transport pt. Report given to [facility]

## 2014-06-09 NOTE — ED Notes (Addendum)
Per EMS, pt co to [facility] staff that she has worsening abdominal pain, pt states her stomach always hurts. Pt has had morning meds which includes hydrocodone 5/325mg .

## 2014-06-10 DIAGNOSIS — I10 Essential (primary) hypertension: Secondary | ICD-10-CM | POA: Diagnosis not present

## 2014-06-10 DIAGNOSIS — I69951 Hemiplegia and hemiparesis following unspecified cerebrovascular disease affecting right dominant side: Secondary | ICD-10-CM | POA: Diagnosis not present

## 2014-06-10 DIAGNOSIS — K8032 Calculus of bile duct with acute cholangitis without obstruction: Secondary | ICD-10-CM | POA: Diagnosis not present

## 2014-06-10 DIAGNOSIS — R339 Retention of urine, unspecified: Secondary | ICD-10-CM | POA: Diagnosis not present

## 2014-07-25 ENCOUNTER — Encounter (HOSPITAL_COMMUNITY): Payer: Self-pay | Admitting: *Deleted

## 2014-07-25 ENCOUNTER — Emergency Department (HOSPITAL_COMMUNITY): Payer: Medicare Other

## 2014-07-25 ENCOUNTER — Inpatient Hospital Stay (HOSPITAL_COMMUNITY)
Admission: EM | Admit: 2014-07-25 | Discharge: 2014-07-28 | DRG: 372 | Payer: Medicare Other | Attending: Internal Medicine | Admitting: Internal Medicine

## 2014-07-25 DIAGNOSIS — N289 Disorder of kidney and ureter, unspecified: Secondary | ICD-10-CM

## 2014-07-25 DIAGNOSIS — I10 Essential (primary) hypertension: Secondary | ICD-10-CM | POA: Diagnosis not present

## 2014-07-25 DIAGNOSIS — F0390 Unspecified dementia without behavioral disturbance: Secondary | ICD-10-CM | POA: Diagnosis not present

## 2014-07-25 DIAGNOSIS — N179 Acute kidney failure, unspecified: Secondary | ICD-10-CM | POA: Diagnosis not present

## 2014-07-25 DIAGNOSIS — R531 Weakness: Secondary | ICD-10-CM | POA: Diagnosis not present

## 2014-07-25 DIAGNOSIS — A0472 Enterocolitis due to Clostridium difficile, not specified as recurrent: Secondary | ICD-10-CM | POA: Diagnosis present

## 2014-07-25 DIAGNOSIS — K219 Gastro-esophageal reflux disease without esophagitis: Secondary | ICD-10-CM | POA: Diagnosis present

## 2014-07-25 DIAGNOSIS — J449 Chronic obstructive pulmonary disease, unspecified: Secondary | ICD-10-CM | POA: Diagnosis not present

## 2014-07-25 DIAGNOSIS — E119 Type 2 diabetes mellitus without complications: Secondary | ICD-10-CM

## 2014-07-25 DIAGNOSIS — J90 Pleural effusion, not elsewhere classified: Secondary | ICD-10-CM | POA: Diagnosis not present

## 2014-07-25 DIAGNOSIS — E860 Dehydration: Secondary | ICD-10-CM | POA: Diagnosis present

## 2014-07-25 DIAGNOSIS — J45909 Unspecified asthma, uncomplicated: Secondary | ICD-10-CM | POA: Diagnosis not present

## 2014-07-25 DIAGNOSIS — R197 Diarrhea, unspecified: Secondary | ICD-10-CM | POA: Diagnosis present

## 2014-07-25 DIAGNOSIS — R339 Retention of urine, unspecified: Secondary | ICD-10-CM | POA: Diagnosis not present

## 2014-07-25 DIAGNOSIS — M1990 Unspecified osteoarthritis, unspecified site: Secondary | ICD-10-CM | POA: Diagnosis not present

## 2014-07-25 DIAGNOSIS — Z7982 Long term (current) use of aspirin: Secondary | ICD-10-CM

## 2014-07-25 DIAGNOSIS — E039 Hypothyroidism, unspecified: Secondary | ICD-10-CM | POA: Diagnosis present

## 2014-07-25 DIAGNOSIS — A047 Enterocolitis due to Clostridium difficile: Secondary | ICD-10-CM | POA: Diagnosis not present

## 2014-07-25 DIAGNOSIS — Z8673 Personal history of transient ischemic attack (TIA), and cerebral infarction without residual deficits: Secondary | ICD-10-CM | POA: Diagnosis not present

## 2014-07-25 DIAGNOSIS — R4182 Altered mental status, unspecified: Secondary | ICD-10-CM | POA: Diagnosis not present

## 2014-07-25 DIAGNOSIS — R1110 Vomiting, unspecified: Secondary | ICD-10-CM | POA: Diagnosis not present

## 2014-07-25 DIAGNOSIS — Z9049 Acquired absence of other specified parts of digestive tract: Secondary | ICD-10-CM | POA: Diagnosis not present

## 2014-07-25 DIAGNOSIS — R112 Nausea with vomiting, unspecified: Secondary | ICD-10-CM | POA: Diagnosis present

## 2014-07-25 DIAGNOSIS — R404 Transient alteration of awareness: Secondary | ICD-10-CM | POA: Diagnosis not present

## 2014-07-25 HISTORY — DX: Cerebral infarction, unspecified: I63.9

## 2014-07-25 LAB — CBC WITH DIFFERENTIAL/PLATELET
BASOS PCT: 0 % (ref 0–1)
Basophils Absolute: 0 10*3/uL (ref 0.0–0.1)
EOS ABS: 0.1 10*3/uL (ref 0.0–0.7)
Eosinophils Relative: 1 % (ref 0–5)
HEMATOCRIT: 36.1 % (ref 36.0–46.0)
HEMOGLOBIN: 11.9 g/dL — AB (ref 12.0–15.0)
Lymphocytes Relative: 25 % (ref 12–46)
Lymphs Abs: 2 10*3/uL (ref 0.7–4.0)
MCH: 33 pg (ref 26.0–34.0)
MCHC: 33 g/dL (ref 30.0–36.0)
MCV: 100 fL (ref 78.0–100.0)
MONOS PCT: 9 % (ref 3–12)
Monocytes Absolute: 0.8 10*3/uL (ref 0.1–1.0)
NEUTROS ABS: 5.4 10*3/uL (ref 1.7–7.7)
Neutrophils Relative %: 65 % (ref 43–77)
Platelets: 154 10*3/uL (ref 150–400)
RBC: 3.61 MIL/uL — ABNORMAL LOW (ref 3.87–5.11)
RDW: 15.2 % (ref 11.5–15.5)
WBC: 8.3 10*3/uL (ref 4.0–10.5)

## 2014-07-25 LAB — COMPREHENSIVE METABOLIC PANEL
ALK PHOS: 63 U/L (ref 39–117)
ALT: 13 U/L (ref 0–35)
AST: 20 U/L (ref 0–37)
Albumin: 3.8 g/dL (ref 3.5–5.2)
Anion gap: 8 (ref 5–15)
BUN: 55 mg/dL — AB (ref 6–23)
CO2: 18 mmol/L — AB (ref 19–32)
Calcium: 9.4 mg/dL (ref 8.4–10.5)
Chloride: 115 mmol/L — ABNORMAL HIGH (ref 96–112)
Creatinine, Ser: 1.95 mg/dL — ABNORMAL HIGH (ref 0.50–1.10)
GFR calc non Af Amer: 20 mL/min — ABNORMAL LOW (ref >90)
GFR, EST AFRICAN AMERICAN: 23 mL/min — AB (ref >90)
GLUCOSE: 106 mg/dL — AB (ref 70–99)
POTASSIUM: 4.5 mmol/L (ref 3.5–5.1)
Sodium: 141 mmol/L (ref 135–145)
TOTAL PROTEIN: 6.8 g/dL (ref 6.0–8.3)
Total Bilirubin: 0.2 mg/dL — ABNORMAL LOW (ref 0.3–1.2)

## 2014-07-25 LAB — CLOSTRIDIUM DIFFICILE BY PCR: Toxigenic C. Difficile by PCR: POSITIVE — AB

## 2014-07-25 LAB — LIPASE, BLOOD: Lipase: 13 U/L (ref 11–59)

## 2014-07-25 LAB — URINALYSIS, ROUTINE W REFLEX MICROSCOPIC
Bilirubin Urine: NEGATIVE
GLUCOSE, UA: NEGATIVE mg/dL
Hgb urine dipstick: NEGATIVE
KETONES UR: NEGATIVE mg/dL
Leukocytes, UA: NEGATIVE
Nitrite: POSITIVE — AB
PROTEIN: NEGATIVE mg/dL
Specific Gravity, Urine: 1.01 (ref 1.005–1.030)
Urobilinogen, UA: 0.2 mg/dL (ref 0.0–1.0)
pH: 5.5 (ref 5.0–8.0)

## 2014-07-25 LAB — MRSA PCR SCREENING: MRSA BY PCR: NEGATIVE

## 2014-07-25 LAB — URINE MICROSCOPIC-ADD ON

## 2014-07-25 LAB — TROPONIN I: Troponin I: <0.03 ng/mL (ref <0.031)

## 2014-07-25 MED ORDER — SODIUM CHLORIDE 0.9 % IV SOLN
INTRAVENOUS | Status: AC
  Administered 2014-07-25: 1000 mL via INTRAVENOUS

## 2014-07-25 MED ORDER — ONDANSETRON HCL 4 MG/2ML IJ SOLN: 4.0000 mg | Freq: Four times a day (QID) | INTRAMUSCULAR | Status: DC | PRN

## 2014-07-25 MED ORDER — HYDROCODONE-ACETAMINOPHEN 5-325 MG PO TABS: 1.0000 | ORAL_TABLET | Freq: Four times a day (QID) | ORAL | Status: DC | PRN

## 2014-07-25 MED ORDER — BOOST / RESOURCE BREEZE PO LIQD
1.0000 | Freq: Three times a day (TID) | ORAL | Status: DC
  Administered 2014-07-25 – 2014-07-28 (×7): 1 via ORAL

## 2014-07-25 MED ORDER — DARIFENACIN HYDROBROMIDE ER 7.5 MG PO TB24
7.5000 mg | ORAL_TABLET | Freq: Every day | ORAL | Status: DC
  Administered 2014-07-26 – 2014-07-28 (×3): 7.5 mg via ORAL
  Filled 2014-07-25 (×3): qty 1

## 2014-07-25 MED ORDER — ONDANSETRON HCL 4 MG PO TABS: 4.0000 mg | ORAL_TABLET | Freq: Four times a day (QID) | ORAL | Status: DC | PRN

## 2014-07-25 MED ORDER — THEOPHYLLINE ER 200 MG PO TB12
200.0000 mg | ORAL_TABLET | Freq: Two times a day (BID) | ORAL | Status: DC
  Administered 2014-07-25 – 2014-07-28 (×6): 200 mg via ORAL
  Filled 2014-07-25 (×6): qty 1

## 2014-07-25 MED ORDER — ENSURE COMPLETE PO LIQD
237.0000 mL | Freq: Three times a day (TID) | ORAL | Status: DC
  Administered 2014-07-26: 237 mL via ORAL

## 2014-07-25 MED ORDER — ZOLPIDEM TARTRATE 5 MG PO TABS
5.0000 mg | ORAL_TABLET | Freq: Every evening | ORAL | Status: DC | PRN
Start: 2014-07-25 — End: 2014-07-28

## 2014-07-25 MED ORDER — FERROUS SULFATE 325 (65 FE) MG PO TABS
325.0000 mg | ORAL_TABLET | Freq: Every day | ORAL | Status: DC
  Administered 2014-07-26 – 2014-07-28 (×3): 325 mg via ORAL
  Filled 2014-07-25 (×3): qty 1

## 2014-07-25 MED ORDER — PANTOPRAZOLE SODIUM 40 MG PO TBEC
40.0000 mg | DELAYED_RELEASE_TABLET | Freq: Every day | ORAL | Status: DC
  Administered 2014-07-26 – 2014-07-27 (×2): 40 mg via ORAL
  Filled 2014-07-25 (×2): qty 1

## 2014-07-25 MED ORDER — LEVETIRACETAM 250 MG PO TABS
250.0000 mg | ORAL_TABLET | Freq: Every day | ORAL | Status: DC
  Administered 2014-07-25 – 2014-07-27 (×3): 250 mg via ORAL
  Filled 2014-07-25 (×3): qty 1

## 2014-07-25 MED ORDER — ASPIRIN 325 MG PO TABS
325.0000 mg | ORAL_TABLET | Freq: Every day | ORAL | Status: DC
  Administered 2014-07-25: 325 mg via ORAL
  Filled 2014-07-25 (×2): qty 1

## 2014-07-25 MED ORDER — SODIUM CHLORIDE 0.9 % IV SOLN
INTRAVENOUS | Status: DC
  Administered 2014-07-25 – 2014-07-28 (×7): via INTRAVENOUS

## 2014-07-25 MED ORDER — PANTOPRAZOLE SODIUM 40 MG PO TBEC: 40.0000 mg | DELAYED_RELEASE_TABLET | Freq: Every day | ORAL | Status: DC

## 2014-07-25 MED ORDER — LEVOTHYROXINE SODIUM 25 MCG PO TABS
25.0000 ug | ORAL_TABLET | Freq: Every day | ORAL | Status: DC
  Administered 2014-07-26 – 2014-07-28 (×3): 25 ug via ORAL
  Filled 2014-07-25 (×3): qty 1

## 2014-07-25 MED ORDER — SODIUM CHLORIDE 0.9 % IV BOLUS (SEPSIS)
500.0000 mL | Freq: Once | INTRAVENOUS | Status: AC
  Administered 2014-07-25: 500 mL via INTRAVENOUS

## 2014-07-25 MED ORDER — ENOXAPARIN SODIUM 30 MG/0.3ML ~~LOC~~ SOLN
30.0000 mg | SUBCUTANEOUS | Status: DC
  Administered 2014-07-25 – 2014-07-27 (×3): 30 mg via SUBCUTANEOUS
  Filled 2014-07-25 (×3): qty 0.3

## 2014-07-25 MED ORDER — MONTELUKAST SODIUM 10 MG PO TABS
10.0000 mg | ORAL_TABLET | Freq: Every morning | ORAL | Status: DC
  Administered 2014-07-26 – 2014-07-28 (×3): 10 mg via ORAL
  Filled 2014-07-25 (×3): qty 1

## 2014-07-25 MED ORDER — POLYETHYLENE GLYCOL 3350 17 G PO PACK: 17.0000 g | PACK | Freq: Every day | ORAL | Status: DC | PRN

## 2014-07-25 MED ORDER — TAMSULOSIN HCL 0.4 MG PO CAPS
0.4000 mg | ORAL_CAPSULE | Freq: Every day | ORAL | Status: DC
  Administered 2014-07-25 – 2014-07-27 (×3): 0.4 mg via ORAL
  Filled 2014-07-25 (×4): qty 1

## 2014-07-25 MED ORDER — ASPIRIN 325 MG PO TABS
325.0000 mg | ORAL_TABLET | Freq: Every day | ORAL | Status: DC
  Administered 2014-07-26 – 2014-07-28 (×3): 325 mg via ORAL
  Filled 2014-07-25 (×3): qty 1

## 2014-07-25 MED ORDER — CALCIUM CARBONATE-VITAMIN D 500-200 MG-UNIT PO TABS
1.0000 | ORAL_TABLET | Freq: Every day | ORAL | Status: DC
  Administered 2014-07-26 – 2014-07-28 (×3): 1 via ORAL
  Filled 2014-07-25 (×3): qty 1

## 2014-07-25 NOTE — ED Notes (Addendum)
EMS called out to [facility], reports pt. Has had n/v intermittently since Saturday, no vomiting today but pt. Seems weaker than usual,

## 2014-07-25 NOTE — H&P (Signed)
[facility] History and Physical  [name] ZOX:[id number] DOB: May 07, 1913 DOA: 07/25/2014  Referring physician: ER PCP: [name], MD   Chief Complaint: Vomiting  HPI: [name] is a 81 y.o. female  This is a 81 year old lady who lives at  [facility]. She presents with a history of nausea and vomiting for the last 3 days. She apparently also has had some diarrhea. She is not verbal and does not give me any history whatsoever. It is unclear to me whether she has dementia or some other problem that stops her talking. In any case, evaluation in the emergency room, so far, shows her to be dehydrated and she is now being admitted for further management.   Review of Systems:  Unable to obtain from patient as described above.  Past Medical History  Diagnosis Date  . Urinary retention   . Gastritis   . Esophageal ulcer   . Salmonella sepsis   . Degenerative disk disease   . Spinal stenosis   . Asthma   . COPD (chronic obstructive pulmonary disease)   . Diabetes mellitus, type 2   . Diverticulosis of colon   . GERD (gastroesophageal reflux disease)   . Hypertension   . Osteoarthritis   . Hiatal hernia   . Pancreatitis   . S/P endoscopy 2009    Dr. Oneida Alar: gastritis  . Diverticulitis   . S/P endoscopy March 2012    Dr. Gala Romney: NSAID induced ulcer  . Hypothyroidism   . MGUS (monoclonal gammopathy of unknown significance)   . Stroke    Past Surgical History  Procedure Laterality Date  . Cervical fusion    . Cholecystectomy    . Ercp N/A 02/02/2014    Procedure: ENDOSCOPIC RETROGRADE CHOLANGIOPANCREATOGRAPHY (ERCP), STONE BASKET EXTRACTION;  Surgeon: Daneil Dolin, MD;  Location: [facility];  Service: Endoscopy;  Laterality: N/A;  . Sphincterotomy N/A 02/02/2014    Procedure: SPHINCTEROTOMY;  Surgeon: Daneil Dolin, MD;  Location: [facility];  Service: Endoscopy;  Laterality: N/A;  . Balloon dilation N/A 02/02/2014    Procedure: BALLOON DILATION;   Surgeon: Daneil Dolin, MD;  Location: [facility];  Service: Endoscopy;  Laterality: N/A;  . Biliary stent placement N/A 02/02/2014    Procedure: BILIARY STENT PLACEMENT;  Surgeon: Daneil Dolin, MD;  Location: [facility];  Service: Endoscopy;  Laterality: N/A;  . Spyglass cholangioscopy N/A 02/02/2014    Procedure: WJXBJYNW CHOLANGIOSCOPY;  Surgeon: Daneil Dolin, MD;  Location: [facility];  Service: Endoscopy;  Laterality: N/A;  . Esophagogastroduodenoscopy N/A 02/05/2014    Procedure: ESOPHAGOGASTRODUODENOSCOPY (EGD);  Surgeon: [name], MD;  Location: [facility];  Service: Endoscopy;  Laterality: N/A;   Social History:  reports that she has never smoked. She has never used smokeless tobacco. She reports that she does not drink alcohol or use illicit drugs.  No Known Allergies  Family History  Problem Relation Age of Onset  . Diabetes Mother       Prior to Admission medications   Medication Sig Start Date End Date Taking? Authorizing Provider  aspirin 325 MG tablet Take 1 tablet (325 mg total) by mouth daily. 02/08/14  Yes [name], DO  Calcium Citrate-Vitamin D 200-250 MG-UNIT TABS Take 1 tablet by mouth daily.   Yes Historical Provider, MD  docusate sodium (COLACE) 100 MG capsule Take 1 capsule (100 mg total) by mouth every 12 (twelve) hours. 03/02/13  Yes Noemi Chapel, MD  feeding supplement, ENSURE COMPLETE, (ENSURE COMPLETE)  LIQD Take 237 mLs by mouth 4 (four) times daily -  with meals and at bedtime. 02/08/14  Yes [name], DO  ferrous sulfate 325 (65 FE) MG tablet Take 325 mg by mouth daily with breakfast.   Yes Historical Provider, MD  furosemide (LASIX) 40 MG tablet Take 40 mg by mouth daily.    Yes Historical Provider, MD  HYDROcodone-acetaminophen (NORCO/VICODIN) 5-325 MG per tablet Take 1 tablet by mouth every 6 (six) hours as needed for moderate pain. 02/08/14  Yes [name], DO  levETIRAcetam (KEPPRA) 250 MG tablet Take 1 tablet (250 mg total) by mouth  daily. Patient taking differently: Take 250 mg by mouth at bedtime.  02/09/14  Yes [name], DO  levothyroxine (SYNTHROID, LEVOTHROID) 25 MCG tablet Take 25 mcg by mouth daily before breakfast.   Yes Historical Provider, MD  montelukast (SINGULAIR) 10 MG tablet Take 10 mg by mouth every morning.    Yes Historical Provider, MD  Multiple Vitamin (DAILY VITAMIN PO) Take 1 tablet by mouth daily.   Yes Historical Provider, MD  pantoprazole (PROTONIX) 40 MG tablet Take 40 mg by mouth daily.    Yes Historical Provider, MD  polyethylene glycol (MIRALAX / GLYCOLAX) packet Take 17 g by mouth daily as needed for mild constipation.   Yes Historical Provider, MD  solifenacin (VESICARE) 5 MG tablet Take 5 mg by mouth daily.   Yes Historical Provider, MD  tamsulosin (FLOMAX) 0.4 MG CAPS capsule Take 1 capsule (0.4 mg total) by mouth daily. Patient taking differently: Take 0.4 mg by mouth daily after supper.  02/08/14  Yes [name], DO  theophylline (THEODUR) 200 MG 12 hr tablet Take 200 mg by mouth 2 (two) times daily.    Yes Historical Provider, MD  zolpidem (AMBIEN) 5 MG tablet Take 5 mg by mouth at bedtime as needed for sleep.    Yes Historical Provider, MD  dicyclomine (BENTYL) 20 MG tablet Take one pill every 8 hours for abd cramps Patient not taking: Reported on 07/25/2014 06/09/14   Milton Ferguson, MD   Physical Exam: Filed Vitals:   07/25/14 1400 07/25/14 1430 07/25/14 1500 07/25/14 1530  BP: 101/51 117/55 106/54 107/57  Pulse: 76 79 82 79  Temp:      TempSrc:      Resp: 18 18 18 18   SpO2: 97% 96% 89% 97%    Wt Readings from Last 3 Encounters:  06/09/14 45.36 kg (100 lb)  02/20/14 47.628 kg (105 lb)  02/04/14 48.353 kg (106 lb 9.6 oz)    General:  Appears clinically dehydrated. She does not appear to be in any pain. She appears to be alert but is nonverbal. Eyes: PERRL, normal lids, irises & conjunctiva ENT: grossly normal hearing, lips & tongue Neck: no LAD, masses or  thyromegaly Cardiovascular: RRR, no m/r/g. No LE edema. Telemetry: SR, no arrhythmias  Respiratory: CTA bilaterally, no w/r/r. Normal respiratory effort. Abdomen: soft, ntnd Skin: no rash or induration seen on limited exam Musculoskeletal: grossly normal tone BUE/BLE Psychiatric: Not examined. Neurologic: grossly non-focal.          Labs on Admission:  Basic Metabolic Panel:  Recent Labs Lab 07/25/14 1132  NA 141  K 4.5  CL 115*  CO2 18*  GLUCOSE 106*  BUN 55*  CREATININE 1.95*  CALCIUM 9.4   Liver Function Tests:  Recent Labs Lab 07/25/14 1132  AST 20  ALT 13  ALKPHOS 63  BILITOT 0.2*  PROT 6.8  ALBUMIN 3.8  Recent Labs Lab 07/25/14 1132  LIPASE 13   No results for input(s): AMMONIA in the last 168 hours. CBC:  Recent Labs Lab 07/25/14 1132  WBC 8.3  NEUTROABS 5.4  HGB 11.9*  HCT 36.1  MCV 100.0  PLT 154   Cardiac Enzymes: No results for input(s): CKTOTAL, CKMB, CKMBINDEX, TROPONINI in the last 168 hours.  BNP (last 3 results) No results for input(s): BNP in the last 8760 hours.  ProBNP (last 3 results) No results for input(s): PROBNP in the last 8760 hours.  CBG: No results for input(s): GLUCAP in the last 168 hours.  Radiological Exams on Admission: Ct Head Wo Contrast  07/25/2014   CLINICAL DATA:  Vomiting, altered mental status. Increasing weakness today.  EXAM: CT HEAD WITHOUT CONTRAST  TECHNIQUE: Contiguous axial images were obtained from the base of the skull through the vertex without intravenous contrast.  COMPARISON:  02/13/2014  FINDINGS: Age-appropriate volume loss. Minimal chronic microvascular changes throughout the deep white matter. No acute intracranial abnormality. Specifically, no hemorrhage, hydrocephalus, mass lesion, acute infarction, or significant intracranial injury. No acute calvarial abnormality. Visualized paranasal sinuses and mastoids clear. Orbital soft tissues unremarkable.  IMPRESSION: No acute intracranial  abnormality.   Electronically Signed   By: Rolm Baptise M.D.   On: 07/25/2014 12:51   Dg Chest Portable 1 View  07/25/2014   CLINICAL DATA:  81 year old female with intermittent nausea since Saturday  EXAM: PORTABLE CHEST - 1 VIEW  COMPARISON:  Prior chest x-ray 02/20/2014  FINDINGS: Patient is rotated toward the right. Cardiac and mediastinal contours remain unchanged. Mild atherosclerotic calcification is present in the transverse aorta. Pulmonary vascular congestion slightly progressed compared to prior. No overt edema. No focal airspace consolidation, pleural effusion or pneumothorax.  The bones appear osteopenic. Degenerative osteoarthritis present in both glenohumeral and acromioclavicular joints. Left greater than right high-riding humeral head suggests chronic rotator cuff injuries.  IMPRESSION: 1. Slightly increased pulmonary vascular congestion without overt edema. 2. Otherwise, stable chest x-ray without evidence of acute cardiopulmonary process.   Electronically Signed   By: Jacqulynn Cadet M.D.   On: 07/25/2014 11:33      Assessment/Plan   1. Dehydration. This is likely from her presumed gastroenteritis. We will treat her with intravenous fluids fairly gently in view of her age. We will monitor renal function closely. Hold furosemide for the time being. 2. Hypertension, appears to be stable, blood pressure slightly soft, reflecting hypovolemia.  Further recommendations will depend on patient's hospital progress.   Code Status: Presumed full code. No family members present to discuss further.   DVT Prophylaxis: Lovenox.  Family Communication: No family members present. I will try and reach contacts listed.  Disposition Plan: Back to [facility] when medically stable.   Time spent: 45 minutes.  Doree Albee [facility] Pager [phone].

## 2014-07-25 NOTE — ED Provider Notes (Signed)
CSN: [id number]     Arrival date & time 07/25/14  1029 History  This chart was scribed for [name] Belling, MD by Jeanell Sparrow, ED Scribe. This patient was seen in room [facility] and the patient's care was started at 10:38 AM.   Chief Complaint  Patient presents with  . Emesis   Patient is a 81 y.o. female presenting with vomiting. The history is provided by the EMS personnel.  Emesis  Level 5 Caveat due to AMS  HPI Comments: [name] is a 81 y.o. female who presents to the Emergency Department complaining of emesis. EMS states that pt has been having intermittent nausea and vomiting starting 3 days ago.   Past Medical History  Diagnosis Date  . Urinary retention   . Gastritis   . Esophageal ulcer   . Salmonella sepsis   . Degenerative disk disease   . Spinal stenosis   . Asthma   . COPD (chronic obstructive pulmonary disease)   . Diabetes mellitus, type 2   . Diverticulosis of colon   . GERD (gastroesophageal reflux disease)   . Hypertension   . Osteoarthritis   . Hiatal hernia   . Pancreatitis   . S/P endoscopy 2009    Dr. Oneida Alar: gastritis  . Diverticulitis   . S/P endoscopy March 2012    Dr. Gala Romney: NSAID induced ulcer  . Hypothyroidism   . MGUS (monoclonal gammopathy of unknown significance)   . Stroke    Past Surgical History  Procedure Laterality Date  . Cervical fusion    . Cholecystectomy    . Ercp N/A 02/02/2014    Procedure: ENDOSCOPIC RETROGRADE CHOLANGIOPANCREATOGRAPHY (ERCP), STONE BASKET EXTRACTION;  Surgeon: Daneil Dolin, MD;  Location: [facility];  Service: Endoscopy;  Laterality: N/A;  . Sphincterotomy N/A 02/02/2014    Procedure: SPHINCTEROTOMY;  Surgeon: Daneil Dolin, MD;  Location: [facility];  Service: Endoscopy;  Laterality: N/A;  . Balloon dilation N/A 02/02/2014    Procedure: BALLOON DILATION;  Surgeon: Daneil Dolin, MD;  Location: [facility];  Service: Endoscopy;  Laterality: N/A;  . Biliary stent placement N/A 02/02/2014    Procedure:  BILIARY STENT PLACEMENT;  Surgeon: Daneil Dolin, MD;  Location: [facility];  Service: Endoscopy;  Laterality: N/A;  . Spyglass cholangioscopy N/A 02/02/2014    Procedure: EGBTDVVO CHOLANGIOSCOPY;  Surgeon: Daneil Dolin, MD;  Location: [facility];  Service: Endoscopy;  Laterality: N/A;  . Esophagogastroduodenoscopy N/A 02/05/2014    Procedure: ESOPHAGOGASTRODUODENOSCOPY (EGD);  Surgeon: [name], MD;  [facility];  Service: Endoscopy;  Laterality: N/A;   Family History  Problem Relation Age of Onset  . Diabetes Mother    History  Substance Use Topics  . Smoking status: Never Smoker   . Smokeless tobacco: Never Used  . Alcohol Use: No   OB History    No data available     Review of Systems  Unable to perform ROS: Mental status change   Allergies  Review of patient's allergies indicates no known allergies.  Home Medications   Prior to Admission medications   Medication Sig Start Date End Date Taking? Authorizing Provider  aspirin 325 MG tablet Take 1 tablet (325 mg total) by mouth daily. 02/08/14  Yes [name], DO  Calcium Citrate-Vitamin D 200-250 MG-UNIT TABS Take 1 tablet by mouth daily.   Yes Historical Provider, MD  docusate sodium (COLACE) 100 MG capsule Take 1 capsule (100 mg total) by mouth every 12 (twelve) hours. 03/02/13  Yes Noemi Chapel, MD  feeding supplement, ENSURE COMPLETE, (ENSURE COMPLETE) LIQD Take 237 mLs by mouth 4 (four) times daily -  with meals and at bedtime. 02/08/14  Yes [name], DO  ferrous sulfate 325 (65 FE) MG tablet Take 325 mg by mouth daily with breakfast.   Yes Historical Provider, MD  furosemide (LASIX) 40 MG tablet Take 40 mg by mouth daily.    Yes Historical Provider, MD  HYDROcodone-acetaminophen (NORCO/VICODIN) 5-325 MG per tablet Take 1 tablet by mouth every 6 (six) hours as needed for moderate pain. 02/08/14  Yes [name], DO  levETIRAcetam (KEPPRA) 250 MG tablet Take 1 tablet (250 mg total) by mouth  daily. Patient taking differently: Take 250 mg by mouth at bedtime.  02/09/14  Yes [name], DO  levothyroxine (SYNTHROID, LEVOTHROID) 25 MCG tablet Take 25 mcg by mouth daily before breakfast.   Yes Historical Provider, MD  montelukast (SINGULAIR) 10 MG tablet Take 10 mg by mouth every morning.    Yes Historical Provider, MD  Multiple Vitamin (DAILY VITAMIN PO) Take 1 tablet by mouth daily.   Yes Historical Provider, MD  pantoprazole (PROTONIX) 40 MG tablet Take 40 mg by mouth daily.    Yes Historical Provider, MD  polyethylene glycol (MIRALAX / GLYCOLAX) packet Take 17 g by mouth daily as needed for mild constipation.   Yes Historical Provider, MD  solifenacin (VESICARE) 5 MG tablet Take 5 mg by mouth daily.   Yes Historical Provider, MD  tamsulosin (FLOMAX) 0.4 MG CAPS capsule Take 1 capsule (0.4 mg total) by mouth daily. Patient taking differently: Take 0.4 mg by mouth daily after supper.  02/08/14  Yes [name], DO  theophylline (THEODUR) 200 MG 12 hr tablet Take 200 mg by mouth 2 (two) times daily.    Yes Historical Provider, MD  zolpidem (AMBIEN) 5 MG tablet Take 5 mg by mouth at bedtime as needed for sleep.    Yes Historical Provider, MD  dicyclomine (BENTYL) 20 MG tablet Take one pill every 8 hours for abd cramps Patient not taking: Reported on 07/25/2014 06/09/14   Milton Ferguson, MD   BP 111/55 mmHg  Pulse 81  Temp(Src) 97.9 F (36.6 C) (Oral)  Resp 16  SpO2 99% Physical Exam  Constitutional: She appears well-developed and well-nourished. No distress.  Pt speaks, but difficult to understand. Responds to commands. Somewhat cachetic.   HENT:  Head: Atraumatic.  Eyes:  Pupils restricted.   Neck: Neck supple. No tracheal deviation present.  Cardiovascular: Normal rate and regular rhythm.   Pulmonary/Chest: Effort normal. No respiratory distress.  Musculoskeletal: She exhibits no edema.  Neurological: She is alert.  Skin: Skin is warm and dry.  Psychiatric: She has a  normal mood and affect. Her behavior is normal.  Nursing note and vitals reviewed.   ED Course  Procedures (including critical care time) DIAGNOSTIC STUDIES: Results for orders placed or performed during the hospital encounter of 07/25/14  Comprehensive metabolic panel  Result Value Ref Range   Sodium 141 135 - 145 mmol/L   Potassium 4.5 3.5 - 5.1 mmol/L   Chloride 115 (H) 96 - 112 mmol/L   CO2 18 (L) 19 - 32 mmol/L   Glucose, Bld 106 (H) 70 - 99 mg/dL   BUN 55 (H) 6 - 23 mg/dL   Creatinine, Ser 1.95 (H) 0.50 - 1.10 mg/dL   Calcium 9.4 8.4 - 10.5 mg/dL   Total Protein 6.8 6.0 - 8.3 g/dL   Albumin 3.8 3.5 -  5.2 g/dL   AST 20 0 - 37 U/L   ALT 13 0 - 35 U/L   Alkaline Phosphatase 63 39 - 117 U/L   Total Bilirubin 0.2 (L) 0.3 - 1.2 mg/dL   GFR calc non Af Amer 20 (L) >90 mL/min   GFR calc Af Amer 23 (L) >90 mL/min   Anion gap 8 5 - 15  CBC with Differential  Result Value Ref Range   WBC 8.3 4.0 - 10.5 K/uL   RBC 3.61 (L) 3.87 - 5.11 MIL/uL   Hemoglobin 11.9 (L) 12.0 - 15.0 g/dL   HCT 36.1 36.0 - 46.0 %   MCV 100.0 78.0 - 100.0 fL   MCH 33.0 26.0 - 34.0 pg   MCHC 33.0 30.0 - 36.0 g/dL   RDW 15.2 11.5 - 15.5 %   Platelets 154 150 - 400 K/uL   Neutrophils Relative % 65 43 - 77 %   Neutro Abs 5.4 1.7 - 7.7 K/uL   Lymphocytes Relative 25 12 - 46 %   Lymphs Abs 2.0 0.7 - 4.0 K/uL   Monocytes Relative 9 3 - 12 %   Monocytes Absolute 0.8 0.1 - 1.0 K/uL   Eosinophils Relative 1 0 - 5 %   Eosinophils Absolute 0.1 0.0 - 0.7 K/uL   Basophils Relative 0 0 - 1 %   Basophils Absolute 0.0 0.0 - 0.1 K/uL  Lipase, blood  Result Value Ref Range   Lipase 13 11 - 59 U/L   Ct Head Wo Contrast  07/25/2014   CLINICAL DATA:  Vomiting, altered mental status. Increasing weakness today.  EXAM: CT HEAD WITHOUT CONTRAST  TECHNIQUE: Contiguous axial images were obtained from the base of the skull through the vertex without intravenous contrast.  COMPARISON:  02/13/2014  FINDINGS: Age-appropriate  volume loss. Minimal chronic microvascular changes throughout the deep white matter. No acute intracranial abnormality. Specifically, no hemorrhage, hydrocephalus, mass lesion, acute infarction, or significant intracranial injury. No acute calvarial abnormality. Visualized paranasal sinuses and mastoids clear. Orbital soft tissues unremarkable.  IMPRESSION: No acute intracranial abnormality.   Electronically Signed   By: Rolm Baptise M.D.   On: 07/25/2014 12:51   Dg Chest Portable 1 View  07/25/2014   CLINICAL DATA:  81 year old female with intermittent nausea since Saturday  EXAM: PORTABLE CHEST - 1 VIEW  COMPARISON:  Prior chest x-ray 02/20/2014  FINDINGS: Patient is rotated toward the right. Cardiac and mediastinal contours remain unchanged. Mild atherosclerotic calcification is present in the transverse aorta. Pulmonary vascular congestion slightly progressed compared to prior. No overt edema. No focal airspace consolidation, pleural effusion or pneumothorax.  The bones appear osteopenic. Degenerative osteoarthritis present in both glenohumeral and acromioclavicular joints. Left greater than right high-riding humeral head suggests chronic rotator cuff injuries.  IMPRESSION: 1. Slightly increased pulmonary vascular congestion without overt edema. 2. Otherwise, stable chest x-ray without evidence of acute cardiopulmonary process.   Electronically Signed   By: Jacqulynn Cadet M.D.   On: 07/25/2014 11:33    COORDINATION OF CARE: 10:42 AM- Plan for treatment includes medication, radiology, and labs.  Labs Review Labs Reviewed  COMPREHENSIVE METABOLIC PANEL - Abnormal; Notable for the following:    Chloride 115 (*)    CO2 18 (*)    Glucose, Bld 106 (*)    BUN 55 (*)    Creatinine, Ser 1.95 (*)    Total Bilirubin 0.2 (*)    GFR calc non Af Amer 20 (*)    GFR calc Af Wyvonnia Lora  23 (*)    All other components within normal limits  CBC WITH DIFFERENTIAL/PLATELET - Abnormal; Notable for the following:     RBC 3.61 (*)    Hemoglobin 11.9 (*)    All other components within normal limits  LIPASE, BLOOD  TROPONIN I  URINALYSIS, ROUTINE W REFLEX MICROSCOPIC    Imaging Review Ct Head Wo Contrast  07/25/2014   CLINICAL DATA:  Vomiting, altered mental status. Increasing weakness today.  EXAM: CT HEAD WITHOUT CONTRAST  TECHNIQUE: Contiguous axial images were obtained from the base of the skull through the vertex without intravenous contrast.  COMPARISON:  02/13/2014  FINDINGS: Age-appropriate volume loss. Minimal chronic microvascular changes throughout the deep white matter. No acute intracranial abnormality. Specifically, no hemorrhage, hydrocephalus, mass lesion, acute infarction, or significant intracranial injury. No acute calvarial abnormality. Visualized paranasal sinuses and mastoids clear. Orbital soft tissues unremarkable.  IMPRESSION: No acute intracranial abnormality.   Electronically Signed   By: Rolm Baptise M.D.   On: 07/25/2014 12:51   Dg Chest Portable 1 View  07/25/2014   CLINICAL DATA:  81 year old female with intermittent nausea since Saturday  EXAM: PORTABLE CHEST - 1 VIEW  COMPARISON:  Prior chest x-ray 02/20/2014  FINDINGS: Patient is rotated toward the right. Cardiac and mediastinal contours remain unchanged. Mild atherosclerotic calcification is present in the transverse aorta. Pulmonary vascular congestion slightly progressed compared to prior. No overt edema. No focal airspace consolidation, pleural effusion or pneumothorax.  The bones appear osteopenic. Degenerative osteoarthritis present in both glenohumeral and acromioclavicular joints. Left greater than right high-riding humeral head suggests chronic rotator cuff injuries.  IMPRESSION: 1. Slightly increased pulmonary vascular congestion without overt edema. 2. Otherwise, stable chest x-ray without evidence of acute cardiopulmonary process.   Electronically Signed   By: Jacqulynn Cadet M.D.   On: 07/25/2014 11:33     EKG  Interpretation   Date/Time:  Tuesday July 25 2014 10:29:58 EST Ventricular Rate:  80 PR Interval:  161 QRS Duration: 62 QT Interval:  371 QTC Calculation: 428 R Axis:   59 Text Interpretation:  Sinus rhythm Ventricular premature complex Anterior  infarct, old Nonspecific T abnormalities, lateral leads Baseline wander in  lead(s) V6 Confirmed by Alvino Chapel  MD, Lennie Dunnigan [phone]) on 07/25/2014 10:35:43  AM      MDM   Final diagnoses:  Dehydration  Renal insufficiency    Patient with emesis and some diarrhea. Increased creatinine. She has some dementia and was minimally responsive to start with. She is improved somewhat with some IV fluids. Will admit to internal medicine. I personally performed the services described in this documentation, which was scribed in my presence. The recorded information has been reviewed and is accurate.      [name] Belling, MD 07/25/14 [phone]

## 2014-07-26 DIAGNOSIS — R112 Nausea with vomiting, unspecified: Secondary | ICD-10-CM | POA: Diagnosis present

## 2014-07-26 DIAGNOSIS — N179 Acute kidney failure, unspecified: Secondary | ICD-10-CM | POA: Diagnosis present

## 2014-07-26 DIAGNOSIS — R197 Diarrhea, unspecified: Secondary | ICD-10-CM | POA: Diagnosis present

## 2014-07-26 DIAGNOSIS — A0472 Enterocolitis due to Clostridium difficile, not specified as recurrent: Secondary | ICD-10-CM | POA: Diagnosis present

## 2014-07-26 DIAGNOSIS — A047 Enterocolitis due to Clostridium difficile: Principal | ICD-10-CM

## 2014-07-26 DIAGNOSIS — F0390 Unspecified dementia without behavioral disturbance: Secondary | ICD-10-CM | POA: Diagnosis present

## 2014-07-26 LAB — COMPREHENSIVE METABOLIC PANEL
ALK PHOS: 60 U/L (ref 39–117)
ALT: 11 U/L (ref 0–35)
ANION GAP: 8 (ref 5–15)
AST: 20 U/L (ref 0–37)
Albumin: 3.4 g/dL — ABNORMAL LOW (ref 3.5–5.2)
BUN: 41 mg/dL — ABNORMAL HIGH (ref 6–23)
CO2: 19 mmol/L (ref 19–32)
CREATININE: 1.31 mg/dL — AB (ref 0.50–1.10)
Calcium: 8.8 mg/dL (ref 8.4–10.5)
Chloride: 114 mmol/L — ABNORMAL HIGH (ref 96–112)
GFR calc Af Amer: 38 mL/min — ABNORMAL LOW (ref >90)
GFR, EST NON AFRICAN AMERICAN: 32 mL/min — AB (ref >90)
Glucose, Bld: 86 mg/dL (ref 70–99)
POTASSIUM: 3.8 mmol/L (ref 3.5–5.1)
SODIUM: 141 mmol/L (ref 135–145)
Total Bilirubin: 0.6 mg/dL (ref 0.3–1.2)
Total Protein: 6.3 g/dL (ref 6.0–8.3)

## 2014-07-26 LAB — CBC
HEMATOCRIT: 32.9 % — AB (ref 36.0–46.0)
Hemoglobin: 11.1 g/dL — ABNORMAL LOW (ref 12.0–15.0)
MCH: 33 pg (ref 26.0–34.0)
MCHC: 33.7 g/dL (ref 30.0–36.0)
MCV: 97.9 fL (ref 78.0–100.0)
PLATELETS: 147 10*3/uL — AB (ref 150–400)
RBC: 3.36 MIL/uL — ABNORMAL LOW (ref 3.87–5.11)
RDW: 15 % (ref 11.5–15.5)
WBC: 7.2 10*3/uL (ref 4.0–10.5)

## 2014-07-26 MED ORDER — ENSURE COMPLETE PO LIQD: 237.0000 mL | Freq: Two times a day (BID) | ORAL | Status: DC

## 2014-07-26 MED ORDER — ENSURE COMPLETE PO LIQD
237.0000 mL | Freq: Two times a day (BID) | ORAL | Status: DC
  Administered 2014-07-26 – 2014-07-28 (×4): 237 mL via ORAL

## 2014-07-26 MED ORDER — METRONIDAZOLE IN NACL 5-0.79 MG/ML-% IV SOLN
500.0000 mg | Freq: Three times a day (TID) | INTRAVENOUS | Status: DC
  Administered 2014-07-26 – 2014-07-28 (×8): 500 mg via INTRAVENOUS
  Filled 2014-07-26 (×8): qty 100

## 2014-07-26 NOTE — Progress Notes (Signed)
Patient was on precautions for rule out C-dif.  Stool PCR tested positive for C-dif, on-call MD made aware, will follow new orders given.

## 2014-07-26 NOTE — Progress Notes (Signed)
INITIAL NUTRITION ASSESSMENT Pt meets criteria for SEVERE MALNUTRITION in the context of Chronic Illness as evidenced by loss of 14% bw in 2 months and severe muscle wasting. DOCUMENTATION CODES Per approved criteria  -Severe malnutrition in the context of chronic illness   INTERVENTION: -Continue Ensure Complete po BID, each supplement provides 350 kcal and 13 grams of protein   -Continue Resource Breeze po TID, each supplement provides 250 kcal and 9 grams of protein  -In light of diminished appetite, recommend MVI  -In light of CDiff, recommend probiotic.   NUTRITION DIAGNOSIS: Inadequate oral intake related to pain and diarrhea as evidenced by loss of 14 pounds in 2 months.   Goal: Pt to meet >/= 90% of their estimated nutrition needs   Monitor:  Oral intake, weight, fluid status, labs, GI distress  Reason for Assessment: MST of 2/low BMI  81 y.o. female  Admitting Dx: <principal problem not specified>  ASSESSMENT: 81 year old lady who lives at [facility]. She presents with a history of nausea and vomiting for the last 3 days. She also has had some diarrhea. ED evaluation shows dehydration and pt admitted for further managembent  Tried to converse with patient , but she is extremely hard to understand. From what I could gather she is complaining of increased pain in her arms and hands. This has cause her to lose her appetite and make it harder to feed herself.   Pt eating 75-100%. Also sipping on ensure complete and resource breeze at bedside.  Nutrition Focused Physical Exam: Difficult to assess what loss is from malnutrition and what is d/t normal aging  Subcutaneous Fat:  Orbital Region: mild Upper Arm Region: moderate Thoracic and Lumbar Region: n/a  Muscle:  Temple Region: mild Clavicle Bone Region: Severe Clavicle and Acromion Bone Region: Moderate Scapular Bone Region: mild Dorsal Hand: mild Patellar Region: none Anterior Thigh Region:  moderate Posterior Calf Region: mild  Edema: none    Height: Ht Readings from Last 1 Encounters:  07/25/14 5\' 1"  (1.549 m)    Weight: Wt Readings from Last 1 Encounters:  07/25/14 86 lb 8 oz (39.236 kg)    Ideal Body Weight: 105  % Ideal Body Weight: 82%  Wt Readings from Last 10 Encounters:  07/25/14 86 lb 8 oz (39.236 kg)  06/09/14 100 lb (45.36 kg)  02/20/14 105 lb (47.628 kg)  02/04/14 106 lb 9.6 oz (48.353 kg)  12/14/13 100 lb (45.36 kg)  09/13/13 97 lb 1.6 oz (44.044 kg)  01/26/13 99 lb 4.8 oz (45.042 kg)  07/14/12 97 lb 8 oz (44.226 kg)  08/14/10 100 lb 8 oz (45.587 kg)   lost 14% bw in last 2 months  Usual Body Weight: Unknown  BMI:  Body mass index is 16.35 kg/(m^2).  Estimated Nutritional Needs: Kcal: 1300-1500 (33-38 kcal/kg) Protein: 47-59 g (1.2-1.5 g/kg) Fluid: 1.3-1.5 liters + enough to replace fluid lost in diarrhea  Skin: WDL  Diet Order: DIET SOFT  EDUCATION NEEDS: -No education needs identified at this time   Intake/Output Summary (Last 24 hours) at 07/26/14 0957 Last data filed at 07/26/14 0900  Gross per 24 hour  Intake    940 ml  Output    825 ml  Net    115 ml    Last BM: diarrhea 3/8   Labs:   Recent Labs Lab 07/25/14 1132 07/26/14 0731  NA 141 141  K 4.5 3.8  CL 115* 114*  CO2 18* 19  BUN 55* 41*  CREATININE 1.95*  1.31*  CALCIUM 9.4 8.8  GLUCOSE 106* 86    CBG (last 3)  No results for input(s): GLUCAP in the last 72 hours.  Scheduled Meds: . aspirin  325 mg Oral Daily  . calcium-vitamin D  1 tablet Oral Daily  . darifenacin  7.5 mg Oral Daily  . enoxaparin (LOVENOX) injection  30 mg Subcutaneous Q24H  . feeding supplement (ENSURE COMPLETE)  237 mL Oral TID WC & HS  . feeding supplement (RESOURCE BREEZE)  1 Container Oral TID BM  . ferrous sulfate  325 mg Oral Q breakfast  . levETIRAcetam  250 mg Oral QHS  . levothyroxine  25 mcg Oral QAC breakfast  . metronidazole  500 mg Intravenous Q8H  . montelukast   10 mg Oral q morning - 10a  . pantoprazole  40 mg Oral Daily  . tamsulosin  0.4 mg Oral QPC supper  . theophylline  200 mg Oral BID    Continuous Infusions: . sodium chloride 100 mL/hr at 07/26/14 8032    Past Medical History  Diagnosis Date  . Urinary retention   . Gastritis   . Esophageal ulcer   . Salmonella sepsis   . Degenerative disk disease   . Spinal stenosis   . Asthma   . COPD (chronic obstructive pulmonary disease)   . Diabetes mellitus, type 2   . Diverticulosis of colon   . GERD (gastroesophageal reflux disease)   . Hypertension   . Osteoarthritis   . Hiatal hernia   . Pancreatitis   . S/P endoscopy 2009    Dr. Oneida Alar: gastritis  . Diverticulitis   . S/P endoscopy March 2012    Dr. Gala Romney: NSAID induced ulcer  . Hypothyroidism   . MGUS (monoclonal gammopathy of unknown significance)   . Stroke     Past Surgical History  Procedure Laterality Date  . Cervical fusion    . Cholecystectomy    . Ercp N/A 02/02/2014    Procedure: ENDOSCOPIC RETROGRADE CHOLANGIOPANCREATOGRAPHY (ERCP), STONE BASKET EXTRACTION;  Surgeon: Daneil Dolin, MD;  Location: [facility];  Service: Endoscopy;  Laterality: N/A;  . Sphincterotomy N/A 02/02/2014    Procedure: SPHINCTEROTOMY;  Surgeon: Daneil Dolin, MD;  Location: [facility];  Service: Endoscopy;  Laterality: N/A;  . Balloon dilation N/A 02/02/2014    Procedure: BALLOON DILATION;  Surgeon: Daneil Dolin, MD;  Location: [facility];  Service: Endoscopy;  Laterality: N/A;  . Biliary stent placement N/A 02/02/2014    Procedure: BILIARY STENT PLACEMENT;  Surgeon: Daneil Dolin, MD;  Location: [facility];  Service: Endoscopy;  Laterality: N/A;  . Spyglass cholangioscopy N/A 02/02/2014    Procedure: ZYYQMGNO CHOLANGIOSCOPY;  Surgeon: Daneil Dolin, MD;  Location: [facility];  Service: Endoscopy;  Laterality: N/A;  . Esophagogastroduodenoscopy N/A 02/05/2014    Procedure: ESOPHAGOGASTRODUODENOSCOPY (EGD);  Surgeon: [name], MD;  Location: [facility];  Service: Endoscopy;  Laterality: N/A;   Burtis Junes RD, LDN Nutrition Pager: [phone] 07/26/2014 9:57 AM

## 2014-07-26 NOTE — Evaluation (Signed)
Physical Therapy Evaluation Patient Details Name: [name] MRN: [id number] DOB: 01/27/1913 Today's Date: 07/26/2014   History of Present Illness  [name] is a 81 y.o. female lady who lives at [facility]. She presents with a history of nausea and vomiting for the last 3 days. She apparently also has had some diarrhea. She is not verbal and does not give me any history whatsoever. It is unclear to me whether she has dementia or some other problem that stops her talking. In any case, evaluation in the emergency room, so far, shows her to be dehydrated and she is now being admitted for further management.    Clinical Impression  [name], a 81 year old female was referred to Physical therapy for generalized weakness. Patient was admitted with a medical diagnosis of  Enteritis due to Clostridium difficile. Patient is seen today for PT evaluation and treatment. Patient is seen in bed, awake, orineted x 1, alert and extremely confused, cooperative, pleasant and able to follow directions well. She kept asking PT who kidnapped her and left her here. She expressed her desire to return home to her parents: Patient was reoriented to her current location and the reason why she is admitted. Per medical chart, (+) History of cognitive impairment: Patient is at baseline. Current functional status: Administrator, [employer] for bed mobility, Modified Independence with transfers and Gait. Good safety awareness. ROM and MMT is WNL/WFL, age appropriate. PT recommendation: ALF vs SNF, per patient/family preference.  Patient reports difficulty lifting LUE: OT consult recommended.      Follow Up Recommendations No PT follow up;Supervision/Assistance - 24 hour;Other (comment) ([place])    Equipment Recommendations  none    Recommendations for Other Services OT consult     Precautions / Restrictions Precautions Precautions: Fall Restrictions Weight Bearing Restrictions: No       Mobility  Bed Mobility Overal bed mobility: Needs Assistance Bed Mobility: Supine to Sit  Supine to sit: Min guard   Transfers Overall transfer level: Modified independent Equipment used: Rolling walker (2 wheeled)   Ambulation/Gait Ambulation/Gait assistance: Modified independent (Device/Increase time) Ambulation Distance (Feet): 75 Feet Assistive device: Rolling walker (2 wheeled) Gait Pattern/deviations: WFL(Within Functional Limits);Step-through pattern;Decreased stride length;Trunk flexed   Gait velocity interpretation: at or above normal speed for age/gender      Balance Overall balance assessment: Needs assistance Sitting-balance support: Bilateral upper extremity supported;Feet supported Sitting balance-Leahy Scale: Fair  Standing balance support: Bilateral upper extremity supported;During functional activity Standing balance-Leahy Scale: Fair       Pertinent Vitals/Pain Pain Assessment: 0-10 Pain Score:  (unable to rate the pain) Pain Location: abdomenal area  Pain Descriptors / Indicators: Aching Pain Intervention(s): Monitored during session;Premedicated before session    Home Living Family/patient expects to be discharged to:: Assisted living  Home Equipment: Walker - 2 wheels      Prior Function Level of Independence: Needs assistance   Gait / Transfers Assistance Needed: pt needs assis to ambulate with a walker, transfer assist is unknown       Hand Dominance   Dominant Hand: Left    Extremity/Trunk Assessment   Upper Extremity Assessment: Defer to OT evaluation  Lower Extremity Assessment: Generalized weakness   Cervical / Trunk Assessment: Kyphotic    Communication   Communication: HOH;No difficulties  Cognition Arousal/Alertness: Awake/alert Behavior During Therapy: WFL for tasks assessed/performed Overall Cognitive Status: History of cognitive impairments - at baseline        Exercises General Exercises -  Lower  Extremity Ankle Circles/Pumps: AROM;Both;10 reps;Supine Quad Sets: AROM;Both;10 reps;Supine Gluteal Sets: AROM;Both;10 reps;Supine Heel Slides: AROM;Both;10 reps;Supine Hip ABduction/ADduction: AROM;Both;10 reps;Supine Straight Leg Raises: AAROM;Both;10 reps;Supine      Assessment/Plan    PT Assessment All further PT needs can be met in the next venue of care  PT Diagnosis Generalized weakness;Altered mental status   PT Problem List Decreased strength;Decreased activity tolerance;Decreased balance;Decreased mobility;Decreased coordination;Decreased safety awareness;Pain  PT Treatment Interventions     PT Goals (Current goals can be found in the Care Plan section) Acute Rehab PT Goals Patient Stated Goal: non stated at this time       Barriers to discharge none          End of Session Equipment Utilized During Treatment: Gait belt Activity Tolerance: Patient tolerated treatment well;Patient limited by fatigue Patient left: in chair;with call bell/phone within reach;with chair alarm set Nurse Communication: Mobility status         Time: 1406-1510 PT Time Calculation (min) (ACUTE ONLY): 64 min   Charges:   PT Evaluation $Initial PT Evaluation Tier I: 1 Procedure PT Treatments $Therapeutic Exercise: 8-22 mins     Hatcher Froning A 07/26/2014, 4:13 PM

## 2014-07-26 NOTE — Clinical Social Work Psychosocial (Signed)
Clinical Social Work Department BRIEF PSYCHOSOCIAL ASSESSMENT 07/26/2014  Patient:  [name], [name]     Account Number:  [id number]     Admit date:  07/25/2014  Clinical Social Worker:  Wyatt Haste  Date/Time:  07/26/2014 09:34 AM  Referred by:  CSW  Date Referred:  07/26/2014 Referred for  ALF Placement   Other Referral:   Interview type:  Patient Other interview type:   Butch Penny at [facility]  Left voicemail for Netta Cedars at [place] Status:  FACILITY Admitted from facility:  [facility] Level of care:  Assisted Living Primary support name:  Rip Harbour Primary support relationship to patient:  NONE Degree of support available:   DSS guardian  Daughter- Pamala Hurry is also somewhat involved    CURRENT CONCERNS Current Concerns  Post-Acute Placement   Other Concerns:    SOCIAL WORK ASSESSMENT / PLAN CSW met with pt at bedside. Pt alert, but oriented to self and place only. CSW spoke with DSS guardian, Netta Cedars [phone]). Rip Harbour reports that they became guardian in June last year and placed pt at [facility]. Pt has seven living children. Her daughter, Pamala Hurry is involved and can have information. Rip Harbour confirms that pt is a full code. She requests return to [facility] at d/c. Per Butch Penny at facility, pt requires assist with bathing, dressing, and toileting. She ambulates with a walker and standy by assist. No home health prior to admission and okay to return. Rip Harbour and Butch Penny updated on pt and aware c diff positive.   Assessment/plan status:  Psychosocial Support/Ongoing Assessment of Needs Other assessment/ plan:   Information/referral to community resources:   [facility]    PATIENT'S/FAMILY'S RESPONSE TO PLAN OF CARE: DSS guardian requests return to [facility] when medically stable. CSW will continue to follow.       [name], [place]

## 2014-07-26 NOTE — Clinical Documentation Improvement (Signed)
  Clinical Indicators  History of Hypertension and diabetes  81 years old Currently dehydrated  W/emesis  Component     Latest Ref Rng 07/25/2014 07/26/2014           BUN     6 - 23 mg/dL 55 (H) 41 (H)  Creatinine     0.50 - 1.10 mg/dL 1.95 (H) 1.31 (H)                                  GFR calc Af Amer     >90 mL/min 23 (L) 38 (L)   Treatments: IV NS 575ml bolus x 2 IV NS @125ml /h x 12 hours then @ 19ml/h I&O monitoring Monitoring CMP   Please clarify renal status Thank you.  Marland Kitchen  Acute renal failure is due to: --Acute tubular necrosis (ATN) --Other (specify) . Be specific with documentation --Acute renal insufficiency and acute kidney disease are not reported as acute renal failure . Document any associated diagnoses/conditions AND/OR . Document the stage of CKD --Chronic kidney disease, stage 1- GFR > OR = 90 --Chronic kidney disease, stage 2 (mild) - GFR 60-89 --Chronic kidney disease, stage 3 (moderate) - GFR 30-59 --Chronic kidney disease, stage 4 (severe) - GFR 15-29 --Chronic kidney disease, stage 5- GFR < 15 --End-stage renal disease (ESRD) . Document any underlying cause of CKD such as Diabetes or Hypertension . Chronic renal failure without a documented stage will be assigned to Chronic kidney disease, unspecified . Document any associated diagnoses/conditionsSupporting Information:   Thank You,  Stephanne Greeley T. Pricilla Handler, MSN, MBA/MHA Clinical Documentation Specialist [email or web address] Office # [phone]

## 2014-07-26 NOTE — Care Management Utilization Note (Signed)
UR completed 

## 2014-07-26 NOTE — Progress Notes (Signed)
[facility] PROGRESS NOTE  EKNOOR NOVACK NID:[id number] DOB: [birth year] DOA: 07/25/2014 PCP: [name], MD  Assessment/Plan:  Enteritis due to Clostridium difficile: no report of recent antibiotics. Watery stool x2 since presentation. Flagyl day #1. Afebrile and non-toxic appearing. Tolerating clear liquids and requesting food. Will advance diet and monitor tolerance. Due to dementia she denies diarrhea/vomiting Active Problems:  Acute kidney injury: related to decreased po intake in setting of diarrhea and vomiting. Will provide fluids, hold nephrotoxins and monitor.   Dehydration: secondary to above. IV fluids as noted. Holding home lasix. Monitor    Diarrhea: 2 episodes since admission . +cdiff pcr as noted. Stool studies  Nausea with vomiting: resolved this am. Tolerating all of clear liquids. Requesting food. Will advance diet  Essential hypertension: BP on soft side mostly. Hold home anti-hypertensive meds and monitor    Diabetes: diet controlled. Will check A1c.       GERD: stable. Hold PPI       Dementia: appears stable at baseline.    Code Status: full Family Communication: none available Disposition Plan: back to [facility]   Consultants:  none  Procedures:  none  Antibiotics:  Flagyl 07/26/14>>>  HPI/Subjective: Awake alert. Denies pain/discomfort/nausea.   Objective: Filed Vitals:   07/26/14 0500  BP: 100/60  Pulse: 80  Temp: 98 F (36.7 C)  Resp: 18    Intake/Output Summary (Last 24 hours) at 07/26/14 1150 Last data filed at 07/26/14 1038  Gross per 24 hour  Intake    940 ml  Output    812 ml  Net    128 ml   Filed Weights   07/25/14 1734  Weight: 39.236 kg (86 lb 8 oz)    Exam:   General:  Thin frail but appears well, engaging   Cardiovascular: RRR no MGR no LE edema  Respiratory: normal effort BS clear bilaterally no wheeze  Abdomen: flat soft +BS but sluggish. Non-tender to palpation  Musculoskeletal:  joints without swelling/erythema  Data Reviewed: Basic Metabolic Panel:  Recent Labs Lab 07/25/14 1132 07/26/14 0731  NA 141 141  K 4.5 3.8  CL 115* 114*  CO2 18* 19  GLUCOSE 106* 86  BUN 55* 41*  CREATININE 1.95* 1.31*  CALCIUM 9.4 8.8   Liver Function Tests:  Recent Labs Lab 07/25/14 1132 07/26/14 0731  AST 20 20  ALT 13 11  ALKPHOS 63 60  BILITOT 0.2* 0.6  PROT 6.8 6.3  ALBUMIN 3.8 3.4*    Recent Labs Lab 07/25/14 1132  LIPASE 13   No results for input(s): AMMONIA in the last 168 hours. CBC:  Recent Labs Lab 07/25/14 1132 07/26/14 0731  WBC 8.3 7.2  NEUTROABS 5.4  --   HGB 11.9* 11.1*  HCT 36.1 32.9*  MCV 100.0 97.9  PLT 154 147*   Cardiac Enzymes:  Recent Labs Lab 07/25/14 1132  TROPONINI <0.03   BNP (last 3 results) No results for input(s): BNP in the last 8760 hours.  ProBNP (last 3 results) No results for input(s): PROBNP in the last 8760 hours.  CBG: No results for input(s): GLUCAP in the last 168 hours.  Recent Results (from the past 240 hour(s))  Clostridium Difficile by PCR     Status: Abnormal   Collection Time: 07/25/14  4:40 PM  Result Value Ref Range Status   C difficile by pcr POSITIVE (A) NEGATIVE Final    Comment: RESULT CALLED TO, READ BACK BY AND VERIFIED WITH: Rockne Coons AT 2208 ON  [id number] BY FORSYTH K   MRSA PCR Screening     Status: None   Collection Time: 07/25/14  5:34 PM  Result Value Ref Range Status   MRSA by PCR NEGATIVE NEGATIVE Final    Comment:        The GeneXpert MRSA Assay (FDA approved for NASAL specimens only), is one component of a comprehensive MRSA colonization surveillance program. It is not intended to diagnose MRSA infection nor to guide or monitor treatment for MRSA infections.      Studies: Ct Head Wo Contrast  07/25/2014   CLINICAL DATA:  Vomiting, altered mental status. Increasing weakness today.  EXAM: CT HEAD WITHOUT CONTRAST  TECHNIQUE: Contiguous axial images were obtained from  the base of the skull through the vertex without intravenous contrast.  COMPARISON:  02/13/2014  FINDINGS: Age-appropriate volume loss. Minimal chronic microvascular changes throughout the deep white matter. No acute intracranial abnormality. Specifically, no hemorrhage, hydrocephalus, mass lesion, acute infarction, or significant intracranial injury. No acute calvarial abnormality. Visualized paranasal sinuses and mastoids clear. Orbital soft tissues unremarkable.  IMPRESSION: No acute intracranial abnormality.   Electronically Signed   By: Rolm Baptise M.D.   On: 07/25/2014 12:51   Dg Chest Portable 1 View  07/25/2014   CLINICAL DATA:  81 year old female with intermittent nausea since Saturday  EXAM: PORTABLE CHEST - 1 VIEW  COMPARISON:  Prior chest x-ray 02/20/2014  FINDINGS: Patient is rotated toward the right. Cardiac and mediastinal contours remain unchanged. Mild atherosclerotic calcification is present in the transverse aorta. Pulmonary vascular congestion slightly progressed compared to prior. No overt edema. No focal airspace consolidation, pleural effusion or pneumothorax.  The bones appear osteopenic. Degenerative osteoarthritis present in both glenohumeral and acromioclavicular joints. Left greater than right high-riding humeral head suggests chronic rotator cuff injuries.  IMPRESSION: 1. Slightly increased pulmonary vascular congestion without overt edema. 2. Otherwise, stable chest x-ray without evidence of acute cardiopulmonary process.   Electronically Signed   By: Jacqulynn Cadet M.D.   On: 07/25/2014 11:33    Scheduled Meds: . aspirin  325 mg Oral Daily  . calcium-vitamin D  1 tablet Oral Daily  . darifenacin  7.5 mg Oral Daily  . enoxaparin (LOVENOX) injection  30 mg Subcutaneous Q24H  . feeding supplement (ENSURE COMPLETE)  237 mL Oral BID BM  . feeding supplement (RESOURCE BREEZE)  1 Container Oral TID BM  . ferrous sulfate  325 mg Oral Q breakfast  . levETIRAcetam  250 mg Oral  QHS  . levothyroxine  25 mcg Oral QAC breakfast  . metronidazole  500 mg Intravenous Q8H  . montelukast  10 mg Oral q morning - 10a  . pantoprazole  40 mg Oral Daily  . tamsulosin  0.4 mg Oral QPC supper  . theophylline  200 mg Oral BID   Continuous Infusions: . sodium chloride 100 mL/hr at 07/26/14 0523    Principal Problem:     Time spent: 35 minutes    [facility] Pager [phone]. If 7PM-7AM, please contact night-coverage at [email or web address], password [facility] 07/26/2014, 11:50 AM  LOS: 1 day

## 2014-07-26 NOTE — Care Management Note (Addendum)
    Page 1 of 1   07/28/2014     3:43:03 PM CARE MANAGEMENT NOTE 07/28/2014  Patient:  [name], [name]   Account Number:  [id number]  Date Initiated:  07/26/2014  Documentation initiated by:  Jolene Provost  Subjective/Objective Assessment:   Pt is from [facility]. Pt aditted for c-diff, n/v/d, dehydration. Pt plans to return to [facility] at discharge. CSW is aware and will arrange for return to facility. No CM needs.     Action/Plan:   Anticipated DC Date:  07/27/2014   Anticipated DC Plan:  ASSISTED LIVING / REST HOME  In-house referral  Clinical Social Worker      DC Planning Services  CM consult      Choice offered to / List presented to:             Status of service:  Completed, signed off Medicare Important Message given?  YES (If response is "NO", the following Medicare IM given date fields will be blank) Date Medicare IM given:  07/28/2014 Medicare IM given by:  Jolene Provost Date Additional Medicare IM given:   Additional Medicare IM given by:    Discharge Disposition:  ASSISTED LIVING  Per UR Regulation:  Reviewed for med. necessity/level of care/duration of stay  If discussed at [place] of Stay Meetings, dates discussed:    Comments:  07/28/2014 [place], RN, MSN, CM 07/26/2014 0930 Jolene Provost, RN, MSN, CM

## 2014-07-27 LAB — BASIC METABOLIC PANEL
Anion gap: 6 (ref 5–15)
BUN: 27 mg/dL — ABNORMAL HIGH (ref 6–23)
CALCIUM: 8.8 mg/dL (ref 8.4–10.5)
CO2: 20 mmol/L (ref 19–32)
CREATININE: 0.96 mg/dL (ref 0.50–1.10)
Chloride: 118 mmol/L — ABNORMAL HIGH (ref 96–112)
GFR calc non Af Amer: 47 mL/min — ABNORMAL LOW (ref >90)
GFR, EST AFRICAN AMERICAN: 55 mL/min — AB (ref >90)
GLUCOSE: 92 mg/dL (ref 70–99)
Potassium: 3.6 mmol/L (ref 3.5–5.1)
SODIUM: 144 mmol/L (ref 135–145)

## 2014-07-27 LAB — CBC
HCT: 31.8 % — ABNORMAL LOW (ref 36.0–46.0)
Hemoglobin: 10.9 g/dL — ABNORMAL LOW (ref 12.0–15.0)
MCH: 33.2 pg (ref 26.0–34.0)
MCHC: 34.3 g/dL (ref 30.0–36.0)
MCV: 97 fL (ref 78.0–100.0)
Platelets: 155 10*3/uL (ref 150–400)
RBC: 3.28 MIL/uL — ABNORMAL LOW (ref 3.87–5.11)
RDW: 14.8 % (ref 11.5–15.5)
WBC: 6.3 10*3/uL (ref 4.0–10.5)

## 2014-07-27 NOTE — Progress Notes (Signed)
[facility] PROGRESS NOTE  [name] HAL:[id number] DOB: 1912/12/13 DOA: 07/25/2014 PCP: [name], MD  Assessment/Plan: C Diff Colitis -Still with significant stool burden (4 stools since midnight per report). -Continue flagyl. -Tolerating diet.  ARF -Resolved with IVF. -2/2 prerenal azotemia with GI losses.   N/V -Resolved. -Presumed related to c diff colitis.  HTN -BP starting to increase. -Follow to determine when ok to restart home meds.  DM -Diet controlled.  GERD -Hold PPI given ongoing c diff infection.  Dementia -At baseline  Code Status: ?Full code Family Communication: none  Disposition Plan: Back to ALF once stool frequency has decreased (hope for 24-48 hours)   Consultants:  None   Antibiotics:  Flagyl   Subjective: No complaints. Pleasantly confused.  Objective: Filed Vitals:   07/26/14 0500 07/26/14 1457 07/26/14 2141 07/27/14 0454  BP: 100/60 148/50 121/57 142/61  Pulse: 80 83 93 78  Temp: 98 F (36.7 C) 97.5 F (36.4 C) 98 F (36.7 C) 97.8 F (36.6 C)  TempSrc: Oral Oral Oral Oral  Resp: 18 18 20 16   Height:      Weight:      SpO2: 98% 100% 93% 100%    Intake/Output Summary (Last 24 hours) at 07/27/14 1504 Last data filed at 07/27/14 1300  Gross per 24 hour  Intake   4505 ml  Output      0 ml  Net   4505 ml   Filed Weights   07/25/14 1734  Weight: 39.236 kg (86 lb 8 oz)    Exam:   General:  awake  Cardiovascular: RRR  Respiratory: CTA B  Abdomen: S/NT/ND/+BS  Extremities: no C/C/E   Neurologic:  Moves all 4 spontaneously  Data Reviewed: Basic Metabolic Panel:  Recent Labs Lab 07/25/14 1132 07/26/14 0731 07/27/14 0548  NA 141 141 144  K 4.5 3.8 3.6  CL 115* 114* 118*  CO2 18* 19 20  GLUCOSE 106* 86 92  BUN 55* 41* 27*  CREATININE 1.95* 1.31* 0.96  CALCIUM 9.4 8.8 8.8   Liver Function Tests:  Recent Labs Lab 07/25/14 1132 07/26/14 0731  AST 20 20  ALT 13  11  ALKPHOS 63 60  BILITOT 0.2* 0.6  PROT 6.8 6.3  ALBUMIN 3.8 3.4*    Recent Labs Lab 07/25/14 1132  LIPASE 13   No results for input(s): AMMONIA in the last 168 hours. CBC:  Recent Labs Lab 07/25/14 1132 07/26/14 0731 07/27/14 0548  WBC 8.3 7.2 6.3  NEUTROABS 5.4  --   --   HGB 11.9* 11.1* 10.9*  HCT 36.1 32.9* 31.8*  MCV 100.0 97.9 97.0  PLT 154 147* 155   Cardiac Enzymes:  Recent Labs Lab 07/25/14 1132  TROPONINI <0.03   BNP (last 3 results) No results for input(s): BNP in the last 8760 hours.  ProBNP (last 3 results) No results for input(s): PROBNP in the last 8760 hours.  CBG: No results for input(s): GLUCAP in the last 168 hours.  Recent Results (from the past 240 hour(s))  Clostridium Difficile by PCR     Status: Abnormal   Collection Time: 07/25/14  4:40 PM  Result Value Ref Range Status   C difficile by pcr POSITIVE (A) NEGATIVE Final    Comment: RESULT CALLED TO, READ BACK BY AND VERIFIED WITH: Rockne Coons AT 2208 ON [id number] BY FORSYTH K   MRSA PCR Screening     Status: None   Collection Time: 07/25/14  5:34 PM  Result Value Ref Range Status   MRSA by PCR NEGATIVE NEGATIVE Final    Comment:        The GeneXpert MRSA Assay (FDA approved for NASAL specimens only), is one component of a comprehensive MRSA colonization surveillance program. It is not intended to diagnose MRSA infection nor to guide or monitor treatment for MRSA infections.      Studies: No results found.  Scheduled Meds: . aspirin  325 mg Oral Daily  . calcium-vitamin D  1 tablet Oral Daily  . darifenacin  7.5 mg Oral Daily  . enoxaparin (LOVENOX) injection  30 mg Subcutaneous Q24H  . feeding supplement (ENSURE COMPLETE)  237 mL Oral BID BM  . feeding supplement (RESOURCE BREEZE)  1 Container Oral TID BM  . ferrous sulfate  325 mg Oral Q breakfast  . levETIRAcetam  250 mg Oral QHS  . levothyroxine  25 mcg Oral QAC breakfast  . metronidazole  500 mg Intravenous Q8H    . montelukast  10 mg Oral q morning - 10a  . pantoprazole  40 mg Oral Daily  . tamsulosin  0.4 mg Oral QPC supper  . theophylline  200 mg Oral BID   Continuous Infusions: . sodium chloride 100 mL/hr at 07/27/14 1348    Principal Problem:   Enteritis due to Clostridium difficile Active Problems:   Diabetes   Essential hypertension   GERD   Dehydration   Diarrhea   Acute kidney injury   Dementia   Nausea with vomiting    Time spent: 25 minutes. Greater than 50% of this time was spent in direct contact with the patient coordinating care.    Lelon Frohlich  [facility] Pager [phone]  If 7PM-7AM, please contact night-coverage at [email or web address], password [facility] 07/27/2014, 3:04 PM  LOS: 2 days

## 2014-07-28 DIAGNOSIS — F0390 Unspecified dementia without behavioral disturbance: Secondary | ICD-10-CM

## 2014-07-28 LAB — BASIC METABOLIC PANEL
Anion gap: 5 (ref 5–15)
BUN: 20 mg/dL (ref 6–23)
CO2: 20 mmol/L (ref 19–32)
Calcium: 8.4 mg/dL (ref 8.4–10.5)
Chloride: 121 mmol/L — ABNORMAL HIGH (ref 96–112)
Creatinine, Ser: 0.79 mg/dL (ref 0.50–1.10)
GFR, EST AFRICAN AMERICAN: 77 mL/min — AB (ref >90)
GFR, EST NON AFRICAN AMERICAN: 67 mL/min — AB (ref >90)
GLUCOSE: 93 mg/dL (ref 70–99)
POTASSIUM: 3.8 mmol/L (ref 3.5–5.1)
SODIUM: 146 mmol/L — AB (ref 135–145)

## 2014-07-28 LAB — CBC
HCT: 34 % — ABNORMAL LOW (ref 36.0–46.0)
HEMOGLOBIN: 11.2 g/dL — AB (ref 12.0–15.0)
MCH: 32 pg (ref 26.0–34.0)
MCHC: 32.9 g/dL (ref 30.0–36.0)
MCV: 97.1 fL (ref 78.0–100.0)
Platelets: 160 10*3/uL (ref 150–400)
RBC: 3.5 MIL/uL — AB (ref 3.87–5.11)
RDW: 15 % (ref 11.5–15.5)
WBC: 6 10*3/uL (ref 4.0–10.5)

## 2014-07-28 LAB — HEMOGLOBIN A1C
Hgb A1c MFr Bld: 5.8 % — ABNORMAL HIGH (ref 4.8–5.6)
Mean Plasma Glucose: 120 mg/dL

## 2014-07-28 MED ORDER — METRONIDAZOLE 500 MG PO TABS: 500.0000 mg | ORAL_TABLET | Freq: Three times a day (TID) | ORAL | Status: DC

## 2014-07-28 NOTE — Progress Notes (Signed)
Patient in stable condition at discharge.  Patient discharged to [facility].  IV removed and clean, dry, and intact at removal.  Patient escorted to main entrance via wheelchair by nurse tech and [employer] staff.  Patient's dentures were in patient's belongings bag and given to [employer].

## 2014-07-28 NOTE — Clinical Social Work Note (Signed)
Pt d/c today back to [facility]. DSS guardian, Netta Cedars notified by voicemail. [facility] aware and will pick up pt. D/C summary and FL2 faxed upon completion.  [name], [place]

## 2014-07-28 NOTE — Care Management Utilization Note (Signed)
UR completed 

## 2014-07-28 NOTE — Discharge Summary (Signed)
Physician Discharge Summary  [name] RDE:[id number] DOB: September 10, 1912 DOA: 07/25/2014  PCP: [name], MD  Admit date: 07/25/2014 Discharge date: 07/28/2014  Time spent: 45 minutes  Recommendations for Outpatient Follow-up:  -Will be discharged back to ALF today. -Flagyl for 14 days upon DC. -Hold PPI for duration of c diff treatment.   Discharge Diagnoses:  Principal Problem:   Enteritis due to Clostridium difficile Active Problems:   Diabetes   Essential hypertension   GERD   Dehydration   Diarrhea   Acute kidney injury   Dementia   Nausea with vomiting   Discharge Condition: Stable and improved  Filed Weights   07/25/14 1734  Weight: 39.236 kg (86 lb 8 oz)    History of present illness:  This is a 81 year old lady who lives at [facility]. She presents with a history of nausea and vomiting for the last 3 days. She apparently also has had some diarrhea. She is not verbal and does not give me any history whatsoever. It is unclear to me whether she has dementia or some other problem that stops her talking. In any case, evaluation in the emergency room, so far, shows her to be dehydrated and she is now being admitted for further management.  Hospital Course:   C Diff Colitis -Only 2 stools past 24 hours. -Continue flagyl for 14 days. -Tolerating diet.  ARF -Resolved with IVF. -2/2 prerenal azotemia with GI losses.   N/V -Resolved. -Presumed related to c diff colitis.  HTN -BP starting to increase. -Resume home meds.  DM -Diet controlled.  GERD -Hold PPI given ongoing c diff infection.  Dementia -At baseline  Procedures:  None   Consultations:  None  Discharge Instructions  Discharge Instructions    Increase activity slowly    Complete by:  As directed             Medication List    STOP taking these medications        dicyclomine 20 MG tablet  Commonly known as:  BENTYL     docusate sodium 100 MG capsule    Commonly known as:  COLACE     furosemide 40 MG tablet  Commonly known as:  LASIX     pantoprazole 40 MG tablet  Commonly known as:  PROTONIX      TAKE these medications        AMBIEN 5 MG tablet  Generic drug:  zolpidem  Take 5 mg by mouth at bedtime as needed for sleep.     aspirin 325 MG tablet  Take 1 tablet (325 mg total) by mouth daily.     Calcium Citrate-Vitamin D 200-250 MG-UNIT Tabs  Take 1 tablet by mouth daily.     DAILY VITAMIN PO  Take 1 tablet by mouth daily.     feeding supplement (ENSURE COMPLETE) Liqd  Take 237 mLs by mouth 4 (four) times daily -  with meals and at bedtime.     ferrous sulfate 325 (65 FE) MG tablet  Take 325 mg by mouth daily with breakfast.     HYDROcodone-acetaminophen 5-325 MG per tablet  Commonly known as:  NORCO/VICODIN  Take 1 tablet by mouth every 6 (six) hours as needed for moderate pain.     levETIRAcetam 250 MG tablet  Commonly known as:  KEPPRA  Take 1 tablet (250 mg total) by mouth daily.     levothyroxine 25 MCG tablet  Commonly known as:  SYNTHROID, LEVOTHROID  Take  25 mcg by mouth daily before breakfast.     metroNIDAZOLE 500 MG tablet  Commonly known as:  FLAGYL  Take 1 tablet (500 mg total) by mouth 3 (three) times daily. For 14 days     montelukast 10 MG tablet  Commonly known as:  SINGULAIR  Take 10 mg by mouth every morning.     polyethylene glycol packet  Commonly known as:  MIRALAX / GLYCOLAX  Take 17 g by mouth daily as needed for mild constipation.     solifenacin 5 MG tablet  Commonly known as:  VESICARE  Take 5 mg by mouth daily.     tamsulosin 0.4 MG Caps capsule  Commonly known as:  FLOMAX  Take 1 capsule (0.4 mg total) by mouth daily.     theophylline 200 MG 12 hr tablet  Commonly known as:  THEODUR  Take 200 mg by mouth 2 (two) times daily.       No Known Allergies     Follow-up Information    Follow up with [name], MD. Schedule an appointment as soon as possible for  a visit in 2 weeks.   Specialty:  Family Medicine   Contact information:   [address] [place] [address] [phone]        The results of significant diagnostics from this hospitalization (including imaging, microbiology, ancillary and laboratory) are listed below for reference.    Significant Diagnostic Studies: Ct Head Wo Contrast  07/25/2014   CLINICAL DATA:  Vomiting, altered mental status. Increasing weakness today.  EXAM: CT HEAD WITHOUT CONTRAST  TECHNIQUE: Contiguous axial images were obtained from the base of the skull through the vertex without intravenous contrast.  COMPARISON:  02/13/2014  FINDINGS: Age-appropriate volume loss. Minimal chronic microvascular changes throughout the deep white matter. No acute intracranial abnormality. Specifically, no hemorrhage, hydrocephalus, mass lesion, acute infarction, or significant intracranial injury. No acute calvarial abnormality. Visualized paranasal sinuses and mastoids clear. Orbital soft tissues unremarkable.  IMPRESSION: No acute intracranial abnormality.   Electronically Signed   By: Rolm Baptise M.D.   On: 07/25/2014 12:51   Dg Chest Portable 1 View  07/25/2014   CLINICAL DATA:  81 year old female with intermittent nausea since Saturday  EXAM: PORTABLE CHEST - 1 VIEW  COMPARISON:  Prior chest x-ray 02/20/2014  FINDINGS: Patient is rotated toward the right. Cardiac and mediastinal contours remain unchanged. Mild atherosclerotic calcification is present in the transverse aorta. Pulmonary vascular congestion slightly progressed compared to prior. No overt edema. No focal airspace consolidation, pleural effusion or pneumothorax.  The bones appear osteopenic. Degenerative osteoarthritis present in both glenohumeral and acromioclavicular joints. Left greater than right high-riding humeral head suggests chronic rotator cuff injuries.  IMPRESSION: 1. Slightly increased pulmonary vascular congestion without overt edema. 2. Otherwise,  stable chest x-ray without evidence of acute cardiopulmonary process.   Electronically Signed   By: Jacqulynn Cadet M.D.   On: 07/25/2014 11:33    Microbiology: Recent Results (from the past 240 hour(s))  Clostridium Difficile by PCR     Status: Abnormal   Collection Time: 07/25/14  4:40 PM  Result Value Ref Range Status   C difficile by pcr POSITIVE (A) NEGATIVE Final    Comment: RESULT CALLED TO, READ BACK BY AND VERIFIED WITH: Rockne Coons AT 2208 ON [id number] BY FORSYTH K   MRSA PCR Screening     Status: None   Collection Time: 07/25/14  5:34 PM  Result Value Ref Range Status   MRSA by  PCR NEGATIVE NEGATIVE Final    Comment:        The GeneXpert MRSA Assay (FDA approved for NASAL specimens only), is one component of a comprehensive MRSA colonization surveillance program. It is not intended to diagnose MRSA infection nor to guide or monitor treatment for MRSA infections.      Labs: Basic Metabolic Panel:  Recent Labs Lab 07/25/14 1132 07/26/14 0731 07/27/14 0548 07/28/14 0545  NA 141 141 144 146*  K 4.5 3.8 3.6 3.8  CL 115* 114* 118* 121*  CO2 18* 19 20 20   GLUCOSE 106* 86 92 93  BUN 55* 41* 27* 20  CREATININE 1.95* 1.31* 0.96 0.79  CALCIUM 9.4 8.8 8.8 8.4   Liver Function Tests:  Recent Labs Lab 07/25/14 1132 07/26/14 0731  AST 20 20  ALT 13 11  ALKPHOS 63 60  BILITOT 0.2* 0.6  PROT 6.8 6.3  ALBUMIN 3.8 3.4*    Recent Labs Lab 07/25/14 1132  LIPASE 13   No results for input(s): AMMONIA in the last 168 hours. CBC:  Recent Labs Lab 07/25/14 1132 07/26/14 0731 07/27/14 0548 07/28/14 0545  WBC 8.3 7.2 6.3 6.0  NEUTROABS 5.4  --   --   --   HGB 11.9* 11.1* 10.9* 11.2*  HCT 36.1 32.9* 31.8* 34.0*  MCV 100.0 97.9 97.0 97.1  PLT 154 147* 155 160   Cardiac Enzymes:  Recent Labs Lab 07/25/14 1132  TROPONINI <0.03   BNP: BNP (last 3 results) No results for input(s): BNP in the last 8760 hours.  ProBNP (last 3 results) No results for  input(s): PROBNP in the last 8760 hours.  CBG: No results for input(s): GLUCAP in the last 168 hours.     SignedLelon Frohlich  [facility] Pager: [phone] 07/28/2014, 2:25 PM

## 2014-07-28 NOTE — Progress Notes (Signed)
Report called to Bennie Dallas at [facility] who verbalized understanding.

## 2014-08-02 DIAGNOSIS — Z681 Body mass index (BMI) 19 or less, adult: Secondary | ICD-10-CM | POA: Diagnosis not present

## 2014-08-02 DIAGNOSIS — R609 Edema, unspecified: Secondary | ICD-10-CM | POA: Diagnosis not present

## 2014-08-02 DIAGNOSIS — M1991 Primary osteoarthritis, unspecified site: Secondary | ICD-10-CM | POA: Diagnosis not present

## 2014-08-02 DIAGNOSIS — K529 Noninfective gastroenteritis and colitis, unspecified: Secondary | ICD-10-CM | POA: Diagnosis not present

## 2014-08-06 DIAGNOSIS — R339 Retention of urine, unspecified: Secondary | ICD-10-CM | POA: Diagnosis not present

## 2014-08-06 DIAGNOSIS — K8032 Calculus of bile duct with acute cholangitis without obstruction: Secondary | ICD-10-CM | POA: Diagnosis not present

## 2014-08-06 DIAGNOSIS — I69951 Hemiplegia and hemiparesis following unspecified cerebrovascular disease affecting right dominant side: Secondary | ICD-10-CM | POA: Diagnosis not present

## 2014-08-06 DIAGNOSIS — I10 Essential (primary) hypertension: Secondary | ICD-10-CM | POA: Diagnosis not present

## 2014-08-08 ENCOUNTER — Emergency Department (HOSPITAL_COMMUNITY): Payer: Medicare Other

## 2014-08-08 ENCOUNTER — Emergency Department (HOSPITAL_COMMUNITY)
Admission: EM | Admit: 2014-08-08 | Discharge: 2014-08-08 | Disposition: A | Discharge status: Home / Self Care | Payer: Medicare Other | Attending: Emergency Medicine | Admitting: Emergency Medicine

## 2014-08-08 ENCOUNTER — Encounter (HOSPITAL_COMMUNITY): Payer: Self-pay | Admitting: *Deleted

## 2014-08-08 DIAGNOSIS — J449 Chronic obstructive pulmonary disease, unspecified: Secondary | ICD-10-CM | POA: Insufficient documentation

## 2014-08-08 DIAGNOSIS — Z79899 Other long term (current) drug therapy: Secondary | ICD-10-CM | POA: Insufficient documentation

## 2014-08-08 DIAGNOSIS — Z8589 Personal history of malignant neoplasm of other organs and systems: Secondary | ICD-10-CM | POA: Diagnosis not present

## 2014-08-08 DIAGNOSIS — Y998 Other external cause status: Secondary | ICD-10-CM | POA: Insufficient documentation

## 2014-08-08 DIAGNOSIS — Z8619 Personal history of other infectious and parasitic diseases: Secondary | ICD-10-CM | POA: Insufficient documentation

## 2014-08-08 DIAGNOSIS — Y9289 Other specified places as the place of occurrence of the external cause: Secondary | ICD-10-CM | POA: Diagnosis not present

## 2014-08-08 DIAGNOSIS — Z23 Encounter for immunization: Secondary | ICD-10-CM | POA: Diagnosis not present

## 2014-08-08 DIAGNOSIS — Z7982 Long term (current) use of aspirin: Secondary | ICD-10-CM | POA: Diagnosis not present

## 2014-08-08 DIAGNOSIS — S0101XA Laceration without foreign body of scalp, initial encounter: Secondary | ICD-10-CM | POA: Diagnosis not present

## 2014-08-08 DIAGNOSIS — R51 Headache: Secondary | ICD-10-CM | POA: Diagnosis not present

## 2014-08-08 DIAGNOSIS — E039 Hypothyroidism, unspecified: Secondary | ICD-10-CM | POA: Diagnosis not present

## 2014-08-08 DIAGNOSIS — Z8719 Personal history of other diseases of the digestive system: Secondary | ICD-10-CM | POA: Insufficient documentation

## 2014-08-08 DIAGNOSIS — Y9389 Activity, other specified: Secondary | ICD-10-CM | POA: Diagnosis not present

## 2014-08-08 DIAGNOSIS — E119 Type 2 diabetes mellitus without complications: Secondary | ICD-10-CM | POA: Insufficient documentation

## 2014-08-08 DIAGNOSIS — W19XXXA Unspecified fall, initial encounter: Secondary | ICD-10-CM

## 2014-08-08 DIAGNOSIS — W06XXXA Fall from bed, initial encounter: Secondary | ICD-10-CM | POA: Diagnosis not present

## 2014-08-08 DIAGNOSIS — I10 Essential (primary) hypertension: Secondary | ICD-10-CM | POA: Diagnosis not present

## 2014-08-08 DIAGNOSIS — S0990XA Unspecified injury of head, initial encounter: Secondary | ICD-10-CM | POA: Diagnosis not present

## 2014-08-08 DIAGNOSIS — Z8673 Personal history of transient ischemic attack (TIA), and cerebral infarction without residual deficits: Secondary | ICD-10-CM | POA: Insufficient documentation

## 2014-08-08 DIAGNOSIS — M1990 Unspecified osteoarthritis, unspecified site: Secondary | ICD-10-CM | POA: Insufficient documentation

## 2014-08-08 MED ORDER — TETANUS-DIPHTH-ACELL PERTUSSIS 5-2.5-18.5 LF-MCG/0.5 IM SUSP
0.5000 mL | Freq: Once | INTRAMUSCULAR | Status: AC
  Administered 2014-08-08: 0.5 mL via INTRAMUSCULAR
  Filled 2014-08-08: qty 0.5

## 2014-08-08 MED ORDER — SODIUM CHLORIDE 0.9 % IV BOLUS (SEPSIS): 1000.0000 mL | Freq: Once | INTRAVENOUS | Status: DC

## 2014-08-08 NOTE — ED Provider Notes (Signed)
CSN: [id number]     Arrival date & time 08/08/14  0707 History  This chart was scribed for Sherwood Gambler, MD by Zola Button, ED Scribe. This patient was seen in room APA02/APA02 and the patient's care was started at 7:27 AM.       Chief Complaint  Patient presents with  . Fall   The history is provided by the patient. No language interpreter was used.   HPI Comments: ZSOFIA [name] is a 81 y.o. female who presents to the Emergency Department complaining of a fall that occurred this morning. Patient fell from her bed as she was trying to get out of bed, hitting the back of her head. She denies lightheadedness and dizziness. Dit not lose consciousness. No chest pain or dyspnea. Unknown last tetanus    Past Medical History  Diagnosis Date  . Urinary retention   . Gastritis   . Esophageal ulcer   . Salmonella sepsis   . Degenerative disk disease   . Spinal stenosis   . Asthma   . COPD (chronic obstructive pulmonary disease)   . Diabetes mellitus, type 2   . Diverticulosis of colon   . GERD (gastroesophageal reflux disease)   . Hypertension   . Osteoarthritis   . Hiatal hernia   . Pancreatitis   . S/P endoscopy 2009    Dr. Oneida Alar: gastritis  . Diverticulitis   . S/P endoscopy March 2012    Dr. Gala Romney: NSAID induced ulcer  . Hypothyroidism   . MGUS (monoclonal gammopathy of unknown significance)   . Stroke    Past Surgical History  Procedure Laterality Date  . Cervical fusion    . Cholecystectomy    . Ercp N/A 02/02/2014    Procedure: ENDOSCOPIC RETROGRADE CHOLANGIOPANCREATOGRAPHY (ERCP), STONE BASKET EXTRACTION;  Surgeon: Daneil Dolin, MD;  Location: [facility];  Service: Endoscopy;  Laterality: N/A;  . Sphincterotomy N/A 02/02/2014    Procedure: SPHINCTEROTOMY;  Surgeon: Daneil Dolin, MD;  Location: [facility];  Service: Endoscopy;  Laterality: N/A;  . Balloon dilation N/A 02/02/2014    Procedure: BALLOON DILATION;  Surgeon: Daneil Dolin, MD;  Location: [facility];  Service:  Endoscopy;  Laterality: N/A;  . Biliary stent placement N/A 02/02/2014    Procedure: BILIARY STENT PLACEMENT;  Surgeon: Daneil Dolin, MD;  Location: [facility];  Service: Endoscopy;  Laterality: N/A;  . Spyglass cholangioscopy N/A 02/02/2014    Procedure: WYOVZCHY CHOLANGIOSCOPY;  Surgeon: Daneil Dolin, MD;  Location: [facility];  Service: Endoscopy;  Laterality: N/A;  . Esophagogastroduodenoscopy N/A 02/05/2014    Procedure: ESOPHAGOGASTRODUODENOSCOPY (EGD);  Surgeon: [name], MD;  Location: [facility];  Service: Endoscopy;  Laterality: N/A;   Family History  Problem Relation Age of Onset  . Diabetes Mother    History  Substance Use Topics  . Smoking status: Never Smoker   . Smokeless tobacco: Never Used  . Alcohol Use: No   OB History    No data available     Review of Systems  Skin: Positive for wound.  Neurological: Negative for dizziness and light-headedness.  All other systems reviewed and are negative.     Allergies  Review of patient's allergies indicates no known allergies.  Home Medications   Prior to Admission medications   Medication Sig Start Date End Date Taking? Authorizing Provider  aspirin 325 MG tablet Take 1 tablet (325 mg total) by mouth daily. 02/08/14   [name], DO  Calcium Citrate-Vitamin D 200-250 MG-UNIT  TABS Take 1 tablet by mouth daily.    Historical Provider, MD  feeding supplement, ENSURE COMPLETE, (ENSURE COMPLETE) LIQD Take 237 mLs by mouth 4 (four) times daily -  with meals and at bedtime. 02/08/14   [name], DO  ferrous sulfate 325 (65 FE) MG tablet Take 325 mg by mouth daily with breakfast.    Historical Provider, MD  HYDROcodone-acetaminophen (NORCO/VICODIN) 5-325 MG per tablet Take 1 tablet by mouth every 6 (six) hours as needed for moderate pain. 02/08/14   [name], DO  levETIRAcetam (KEPPRA) 250 MG tablet Take 1 tablet (250 mg total) by mouth daily. Patient taking differently: Take 250 mg by mouth at bedtime.   02/09/14   [name], DO  levothyroxine (SYNTHROID, LEVOTHROID) 25 MCG tablet Take 25 mcg by mouth daily before breakfast.    Historical Provider, MD  metroNIDAZOLE (FLAGYL) 500 MG tablet Take 1 tablet (500 mg total) by mouth 3 (three) times daily. For 14 days 07/28/14   [name], MD  montelukast (SINGULAIR) 10 MG tablet Take 10 mg by mouth every morning.     Historical Provider, MD  Multiple Vitamin (DAILY VITAMIN PO) Take 1 tablet by mouth daily.    Historical Provider, MD  polyethylene glycol (MIRALAX / GLYCOLAX) packet Take 17 g by mouth daily as needed for mild constipation.    Historical Provider, MD  solifenacin (VESICARE) 5 MG tablet Take 5 mg by mouth daily.    Historical Provider, MD  tamsulosin (FLOMAX) 0.4 MG CAPS capsule Take 1 capsule (0.4 mg total) by mouth daily. Patient taking differently: Take 0.4 mg by mouth daily after supper.  02/08/14   [name], DO  theophylline (THEODUR) 200 MG 12 hr tablet Take 200 mg by mouth 2 (two) times daily.     Historical Provider, MD  zolpidem (AMBIEN) 5 MG tablet Take 5 mg by mouth at bedtime as needed for sleep.     Historical Provider, MD   BP 148/72 mmHg  Pulse 73  Temp(Src) 97.8 F (36.6 C)  Resp 16  Ht 5\' 2"  (1.575 m)  Wt 115 lb (52.164 kg)  BMI 21.03 kg/m2  SpO2 96% Physical Exam  Constitutional: She appears well-developed and well-nourished. No distress.  HENT:  Head: Normocephalic.  Mouth/Throat: Oropharynx is clear and moist. No oropharyngeal exudate.  Occipital scalp hematoma on the left. After cleaning wound, a small (<0.5 cm) laceration is noted that is well approximated without bleeding. Appears superficial  Eyes: EOM are normal. Pupils are equal, round, and reactive to light.  Neck: Neck supple.  Cardiovascular: Normal rate, regular rhythm and normal heart sounds.   Pulmonary/Chest: Effort normal and breath sounds normal.  Abdominal: Soft. She exhibits no distension. There is no tenderness.   Musculoskeletal: She exhibits no edema.  Neurological: She is alert. No cranial nerve deficit.  Normal strength and sensation in all 4 extremities. Mild confusion on date but otherwise alert and oriented  Skin: Skin is warm and dry. No rash noted.  Psychiatric: She has a normal mood and affect. Her behavior is normal.  Nursing note and vitals reviewed.   ED Course  Procedures  DIAGNOSTIC STUDIES: Oxygen Saturation is 96% on room air, adequate by my interpretation.    COORDINATION OF CARE: 7:30 AM-Discussed treatment plan which includes CT head with pt at bedside and pt agreed to plan.    Labs Review Labs Reviewed - No data to display  Imaging Review Ct Head Wo Contrast  08/08/2014  CLINICAL DATA:  Fall out of bed this morning with posterior head injury and scalp laceration.  EXAM: CT HEAD WITHOUT CONTRAST  TECHNIQUE: Contiguous axial images were obtained from the base of the skull through the vertex without intravenous contrast.  COMPARISON:  07/25/2014  FINDINGS: Mild scalp soft tissue swelling posteriorly. No evidence of skull fracture. Stable small vessel disease in the deep white matter. The brain demonstrates no evidence of hemorrhage, infarction, edema, mass effect, extra-axial fluid collection, hydrocephalus or mass lesion.  IMPRESSION: Mild posterior scalp soft tissue swelling.  No acute findings.   Electronically Signed   By: Aletta Edouard M.D.   On: 08/08/2014 08:47     EKG Interpretation None      MDM   Final diagnoses:  Fall, initial encounter  Scalp laceration, initial encounter    Patient with what sounds like a mechanical fall out of bed and hitting her head. CT shows no intracranial hemorrhage or skull fracture. She has a very small wound that is likely where the initial bleeding came from but now there is no bleeding and the wound is well approximated. It is so small that I doubt putting a stitch or staple would be significantly helpful. Given this, will  update tetanus and return back to the nursing home.  I personally performed the services described in this documentation, which was scribed in my presence. The recorded information has been reviewed and is accurate.    Sherwood Gambler, MD 08/08/14 1000

## 2014-08-08 NOTE — ED Notes (Signed)
Pt from [facility] with c/o fall, laceration to back of head

## 2014-08-08 NOTE — ED Notes (Signed)
Contacted [facility] who stated that they would send wheelchair transport for this pt.  Pt sleeping at this time.  No distress noted.

## 2014-08-08 NOTE — Discharge Instructions (Signed)
Fall Prevention and Home Safety Falls cause injuries and can affect all age groups. It is possible to use preventive measures to significantly decrease the likelihood of falls. There are many simple measures which can make your home safer and prevent falls. OUTDOORS  Repair cracks and edges of walkways and driveways.  Remove high doorway thresholds.  Trim shrubbery on the main path into your home.  Have good outside lighting.  Clear walkways of tools, rocks, debris, and clutter.  Check that handrails are not broken and are securely fastened. Both sides of steps should have handrails.  Have leaves, snow, and ice cleared regularly.  Use sand or salt on walkways during winter months.  In the garage, clean up grease or oil spills. BATHROOM  Install night lights.  Install grab bars by the toilet and in the tub and shower.  Use non-skid mats or decals in the tub or shower.  Place a plastic non-slip stool in the shower to sit on, if needed.  Keep floors dry and clean up all water on the floor immediately.  Remove soap buildup in the tub or shower on a regular basis.  Secure bath mats with non-slip, double-sided rug tape.  Remove throw rugs and tripping hazards from the floors. BEDROOMS  Install night lights.  Make sure a bedside light is easy to reach.  Do not use oversized bedding.  Keep a telephone by your bedside.  Have a firm chair with side arms to use for getting dressed.  Remove throw rugs and tripping hazards from the floor. KITCHEN  Keep handles on pots and pans turned toward the center of the stove. Use back burners when possible.  Clean up spills quickly and allow time for drying.  Avoid walking on wet floors.  Avoid hot utensils and knives.  Position shelves so they are not too high or low.  Place commonly used objects within easy reach.  If necessary, use a sturdy step stool with a grab bar when reaching.  Keep electrical cables out of the  way.  Do not use floor polish or wax that makes floors slippery. If you must use wax, use non-skid floor wax.  Remove throw rugs and tripping hazards from the floor. STAIRWAYS  Never leave objects on stairs.  Place handrails on both sides of stairways and use them. Fix any loose handrails. Make sure handrails on both sides of the stairways are as long as the stairs.  Check carpeting to make sure it is firmly attached along stairs. Make repairs to worn or loose carpet promptly.  Avoid placing throw rugs at the top or bottom of stairways, or properly secure the rug with carpet tape to prevent slippage. Get rid of throw rugs, if possible.  Have an electrician put in a light switch at the top and bottom of the stairs. OTHER FALL PREVENTION TIPS  Wear low-heel or rubber-soled shoes that are supportive and fit well. Wear closed toe shoes.  When using a stepladder, make sure it is fully opened and both spreaders are firmly locked. Do not climb a closed stepladder.  Add color or contrast paint or tape to grab bars and handrails in your home. Place contrasting color strips on first and last steps.  Learn and use mobility aids as needed. Install an electrical emergency response system.  Turn on lights to avoid dark areas. Replace light bulbs that burn out immediately. Get light switches that glow.  Arrange furniture to create clear pathways. Keep furniture in the same place.  Firmly attach carpet with non-skid or double-sided tape. °· Eliminate uneven floor surfaces. °· Select a carpet pattern that does not visually hide the edge of steps. °· Be aware of all pets. °OTHER HOME SAFETY TIPS °· Set the water temperature for 120° F (48.8° C). °· Keep emergency numbers on or near the telephone. °· Keep smoke detectors on every level of the home and near sleeping areas. °Document Released: 04/25/2002 Document Revised: 11/04/2011 Document Reviewed: 07/25/2011 °[employer]® Patient Information ©2015  [employer]. This information is not intended to replace advice given to you by your health care provider. Make sure you discuss any questions you have with your health care provider. ° ° ° °Laceration Care, Adult °A laceration is a cut or lesion that goes through all layers of the skin and into the tissue just beneath the skin. °TREATMENT  °Some lacerations may not require closure. Some lacerations may not be able to be closed due to an increased risk of infection. It is important to see your caregiver as soon as possible after an injury to minimize the risk of infection and maximize the opportunity for successful closure. °If closure is appropriate, pain medicines may be given, if needed. The wound will be cleaned to help prevent infection. Your caregiver will use stitches (sutures), staples, wound glue (adhesive), or skin adhesive strips to repair the laceration. These tools bring the skin edges together to allow for faster healing and a better cosmetic outcome. However, all wounds will heal with a scar. Once the wound has healed, scarring can be minimized by covering the wound with sunscreen during the day for 1 full year. °HOME CARE INSTRUCTIONS  °For sutures or staples: °· Keep the wound clean and dry. °· If you were given a bandage (dressing), you should change it at least once a day. Also, change the dressing if it becomes wet or dirty, or as directed by your caregiver. °· Wash the wound with soap and water 2 times a day. Rinse the wound off with water to remove all soap. Pat the wound dry with a clean towel. °· After cleaning, apply a thin layer of the antibiotic ointment as recommended by your caregiver. This will help prevent infection and keep the dressing from sticking. °· You may shower as usual after the first 24 hours. Do not soak the wound in water until the sutures are removed. °· Only take over-the-counter or prescription medicines for pain, discomfort, or fever as directed by your  caregiver. °· Get your sutures or staples removed as directed by your caregiver. °For skin adhesive strips: °· Keep the wound clean and dry. °· Do not get the skin adhesive strips wet. You may bathe carefully, using caution to keep the wound dry. °· If the wound gets wet, pat it dry with a clean towel. °· Skin adhesive strips will fall off on their own. You may trim the strips as the wound heals. Do not remove skin adhesive strips that are still stuck to the wound. They will fall off in time. °For wound adhesive: °· You may briefly wet your wound in the shower or bath. Do not soak or scrub the wound. Do not swim. Avoid periods of heavy perspiration until the skin adhesive has fallen off on its own. After showering or bathing, gently pat the wound dry with a clean towel. °· Do not apply liquid medicine, cream medicine, or ointment medicine to your wound while the skin adhesive is in place. This may loosen the film before   your wound is healed.  If a dressing is placed over the wound, be careful not to apply tape directly over the skin adhesive. This may cause the adhesive to be pulled off before the wound is healed.  Avoid prolonged exposure to sunlight or tanning lamps while the skin adhesive is in place. Exposure to ultraviolet light in the first year will darken the scar.  The skin adhesive will usually remain in place for 5 to 10 days, then naturally fall off the skin. Do not pick at the adhesive film. You may need a tetanus shot if:  You cannot remember when you had your last tetanus shot.  You have never had a tetanus shot. If you get a tetanus shot, your arm may swell, get red, and feel warm to the touch. This is common and not a problem. If you need a tetanus shot and you choose not to have one, there is a rare chance of getting tetanus. Sickness from tetanus can be serious. SEEK MEDICAL CARE IF:   You have redness, swelling, or increasing pain in the wound.  You see a red line that goes away  from the wound.  You have yellowish-white fluid (pus) coming from the wound.  You have a fever.  You notice a bad smell coming from the wound or dressing.  Your wound breaks open before or after sutures have been removed.  You notice something coming out of the wound such as wood or glass.  Your wound is on your hand or foot and you cannot move a finger or toe. SEEK IMMEDIATE MEDICAL CARE IF:   Your pain is not controlled with prescribed medicine.  You have severe swelling around the wound causing pain and numbness or a change in color in your arm, hand, leg, or foot.  Your wound splits open and starts bleeding.  You have worsening numbness, weakness, or loss of function of any joint around or beyond the wound.  You develop painful lumps near the wound or on the skin anywhere on your body. MAKE SURE YOU:   Understand these instructions.  Will watch your condition.  Will get help right away if you are not doing well or get worse. Document Released: 05/05/2005 Document Revised: 07/28/2011 Document Reviewed: 10/29/2010 [facility] Patient Information 2015 [place], [place]. This information is not intended to replace advice given to you by your health care provider. Make sure you discuss any questions you have with your health care provider.   Head Injury You have received a head injury. It does not appear serious at this time. Headaches and vomiting are common following head injury. It should be easy to awaken from sleeping. Sometimes it is necessary for you to stay in the emergency department for a while for observation. Sometimes admission to the hospital may be needed. After injuries such as yours, most problems occur within the first 24 hours, but side effects may occur up to 7-10 days after the injury. It is important for you to carefully monitor your condition and contact your health care provider or seek immediate medical care if there is a change in your condition. WHAT ARE THE  TYPES OF HEAD INJURIES? Head injuries can be as minor as a bump. Some head injuries can be more severe. More severe head injuries include:  A jarring injury to the brain (concussion).  A bruise of the brain (contusion). This mean there is bleeding in the brain that can cause swelling.  A cracked skull (skull fracture).  Bleeding  in the brain that collects, clots, and forms a bump (hematoma). WHAT CAUSES A HEAD INJURY? A serious head injury is most likely to happen to someone who is in a car wreck and is not wearing a seat belt. Other causes of major head injuries include bicycle or motorcycle accidents, sports injuries, and falls. HOW ARE HEAD INJURIES DIAGNOSED? A complete history of the event leading to the injury and your current symptoms will be helpful in diagnosing head injuries. Many times, pictures of the brain, such as CT or MRI are needed to see the extent of the injury. Often, an overnight hospital stay is necessary for observation.  WHEN SHOULD I SEEK IMMEDIATE MEDICAL CARE?  You should get help right away if:  You have confusion or drowsiness.  You feel sick to your stomach (nauseous) or have continued, forceful vomiting.  You have dizziness or unsteadiness that is getting worse.  You have severe, continued headaches not relieved by medicine. Only take over-the-counter or prescription medicines for pain, fever, or discomfort as directed by your health care provider.  You do not have normal function of the arms or legs or are unable to walk.  You notice changes in the black spots in the center of the colored part of your eye (pupil).  You have a clear or bloody fluid coming from your nose or ears.  You have a loss of vision. During the next 24 hours after the injury, you must stay with someone who can watch you for the warning signs. This person should contact local emergency services (911 in the [place] if you have seizures, you become unconscious, or you are unable to wake  up. HOW CAN I PREVENT A HEAD INJURY IN THE FUTURE? The most important factor for preventing major head injuries is avoiding motor vehicle accidents. To minimize the potential for damage to your head, it is crucial to wear seat belts while riding in motor vehicles. Wearing helmets while bike riding and playing collision sports (like football) is also helpful. Also, avoiding dangerous activities around the house will further help reduce your risk of head injury.  WHEN CAN I RETURN TO NORMAL ACTIVITIES AND ATHLETICS? You should be reevaluated by your health care provider before returning to these activities. If you have any of the following symptoms, you should not return to activities or contact sports until 1 week after the symptoms have stopped:  Persistent headache.  Dizziness or vertigo.  Poor attention and concentration.  Confusion.  Memory problems.  Nausea or vomiting.  Fatigue or tire easily.  Irritability.  Intolerant of bright lights or loud noises.  Anxiety or depression.  Disturbed sleep. MAKE SURE YOU:   Understand these instructions.  Will watch your condition.  Will get help right away if you are not doing well or get worse. Document Released: 05/05/2005 Document Revised: 05/10/2013 Document Reviewed: 01/10/2013 [facility] Patient Information 2015 [place], [place]. This information is not intended to replace advice given to you by your health care provider. Make sure you discuss any questions you have with your health care provider.

## 2014-08-08 NOTE — ED Notes (Signed)
Wound to back of head cleaned.  <0.5cm laceration noted to posterior scalp with no bleeding at this time.

## 2014-08-08 NOTE — ED Notes (Signed)
Pt denies any complaints at this time.  States that she was getting out of bed to put her clothes on at the nursing home and slipped and fell.  Small amt of dried blood to posterior left scalp with no laceration visible and no further bleeding noted.  Pt is alert and oriented to baseline.  Able to sit up in bed without difficulty or pain.

## 2014-08-09 DIAGNOSIS — Z681 Body mass index (BMI) 19 or less, adult: Secondary | ICD-10-CM | POA: Diagnosis not present

## 2014-08-09 DIAGNOSIS — S0003XD Contusion of scalp, subsequent encounter: Secondary | ICD-10-CM | POA: Diagnosis not present

## 2014-08-09 DIAGNOSIS — K8032 Calculus of bile duct with acute cholangitis without obstruction: Secondary | ICD-10-CM | POA: Diagnosis not present

## 2014-08-09 DIAGNOSIS — R339 Retention of urine, unspecified: Secondary | ICD-10-CM | POA: Diagnosis not present

## 2014-08-09 DIAGNOSIS — I10 Essential (primary) hypertension: Secondary | ICD-10-CM | POA: Diagnosis not present

## 2014-08-10 ENCOUNTER — Emergency Department (HOSPITAL_COMMUNITY): Payer: Medicare Other

## 2014-08-10 ENCOUNTER — Encounter (HOSPITAL_COMMUNITY): Payer: Self-pay | Admitting: *Deleted

## 2014-08-10 ENCOUNTER — Emergency Department (HOSPITAL_COMMUNITY)
Admission: EM | Admit: 2014-08-10 | Discharge: 2014-08-10 | Disposition: A | Discharge status: Home / Self Care | Payer: Medicare Other | Attending: Emergency Medicine | Admitting: Emergency Medicine

## 2014-08-10 DIAGNOSIS — K219 Gastro-esophageal reflux disease without esophagitis: Secondary | ICD-10-CM | POA: Diagnosis not present

## 2014-08-10 DIAGNOSIS — Y9289 Other specified places as the place of occurrence of the external cause: Secondary | ICD-10-CM | POA: Diagnosis not present

## 2014-08-10 DIAGNOSIS — E039 Hypothyroidism, unspecified: Secondary | ICD-10-CM | POA: Insufficient documentation

## 2014-08-10 DIAGNOSIS — Z9889 Other specified postprocedural states: Secondary | ICD-10-CM | POA: Insufficient documentation

## 2014-08-10 DIAGNOSIS — E119 Type 2 diabetes mellitus without complications: Secondary | ICD-10-CM | POA: Diagnosis not present

## 2014-08-10 DIAGNOSIS — Z8589 Personal history of malignant neoplasm of other organs and systems: Secondary | ICD-10-CM | POA: Insufficient documentation

## 2014-08-10 DIAGNOSIS — Y998 Other external cause status: Secondary | ICD-10-CM | POA: Insufficient documentation

## 2014-08-10 DIAGNOSIS — Z8673 Personal history of transient ischemic attack (TIA), and cerebral infarction without residual deficits: Secondary | ICD-10-CM | POA: Insufficient documentation

## 2014-08-10 DIAGNOSIS — Y9389 Activity, other specified: Secondary | ICD-10-CM | POA: Diagnosis not present

## 2014-08-10 DIAGNOSIS — Z79899 Other long term (current) drug therapy: Secondary | ICD-10-CM | POA: Insufficient documentation

## 2014-08-10 DIAGNOSIS — Z8619 Personal history of other infectious and parasitic diseases: Secondary | ICD-10-CM | POA: Diagnosis not present

## 2014-08-10 DIAGNOSIS — Z7982 Long term (current) use of aspirin: Secondary | ICD-10-CM | POA: Diagnosis not present

## 2014-08-10 DIAGNOSIS — I10 Essential (primary) hypertension: Secondary | ICD-10-CM | POA: Diagnosis not present

## 2014-08-10 DIAGNOSIS — S7001XA Contusion of right hip, initial encounter: Secondary | ICD-10-CM | POA: Insufficient documentation

## 2014-08-10 DIAGNOSIS — W1839XA Other fall on same level, initial encounter: Secondary | ICD-10-CM | POA: Diagnosis not present

## 2014-08-10 DIAGNOSIS — M25551 Pain in right hip: Secondary | ICD-10-CM | POA: Diagnosis not present

## 2014-08-10 DIAGNOSIS — R40241 Glasgow coma scale score 13-15: Secondary | ICD-10-CM | POA: Diagnosis not present

## 2014-08-10 DIAGNOSIS — S79911A Unspecified injury of right hip, initial encounter: Secondary | ICD-10-CM | POA: Diagnosis not present

## 2014-08-10 DIAGNOSIS — J449 Chronic obstructive pulmonary disease, unspecified: Secondary | ICD-10-CM | POA: Diagnosis not present

## 2014-08-10 DIAGNOSIS — M1990 Unspecified osteoarthritis, unspecified site: Secondary | ICD-10-CM | POA: Diagnosis not present

## 2014-08-10 DIAGNOSIS — R52 Pain, unspecified: Secondary | ICD-10-CM

## 2014-08-10 MED ORDER — ACETAMINOPHEN 325 MG PO TABS
650.0000 mg | ORAL_TABLET | Freq: Once | ORAL | Status: AC
  Administered 2014-08-10: 650 mg via ORAL
  Filled 2014-08-10: qty 2

## 2014-08-10 NOTE — ED Notes (Signed)
Pt from [place] [facility], per EMS, pt fell Monday, now complains of rt hip pain, no deformity or bruising noted.

## 2014-08-10 NOTE — Discharge Instructions (Signed)
Tylenol for pain.  Follow up as needed with your md

## 2014-08-10 NOTE — ED Provider Notes (Signed)
CSN: [id number]     Arrival date & time 08/10/14  1515 History   First MD Initiated Contact with Patient 08/10/14 1516     Chief Complaint  Patient presents with  . Hip Pain     (Consider location/radiation/quality/duration/timing/severity/associated sxs/prior Treatment) Patient is a 81 y.o. female presenting with hip pain. The history is provided by the patient (pt fell and has right hip pain).  Hip Pain This is a new problem. The current episode started 12 to 24 hours ago. The problem occurs constantly. The problem has not changed since onset.Associated symptoms include abdominal pain. Pertinent negatives include no chest pain and no headaches. Exacerbated by: movement of hip. Nothing relieves the symptoms.    Past Medical History  Diagnosis Date  . Urinary retention   . Gastritis   . Esophageal ulcer   . Salmonella sepsis   . Degenerative disk disease   . Spinal stenosis   . Asthma   . COPD (chronic obstructive pulmonary disease)   . Diabetes mellitus, type 2   . Diverticulosis of colon   . GERD (gastroesophageal reflux disease)   . Hypertension   . Osteoarthritis   . Hiatal hernia   . Pancreatitis   . S/P endoscopy 2009    Dr. Oneida Alar: gastritis  . Diverticulitis   . S/P endoscopy March 2012    Dr. Gala Romney: NSAID induced ulcer  . Hypothyroidism   . MGUS (monoclonal gammopathy of unknown significance)   . Stroke    Past Surgical History  Procedure Laterality Date  . Cervical fusion    . Cholecystectomy    . Ercp N/A 02/02/2014    Procedure: ENDOSCOPIC RETROGRADE CHOLANGIOPANCREATOGRAPHY (ERCP), STONE BASKET EXTRACTION;  Surgeon: Daneil Dolin, MD;  Location: [facility];  Service: Endoscopy;  Laterality: N/A;  . Sphincterotomy N/A 02/02/2014    Procedure: SPHINCTEROTOMY;  Surgeon: Daneil Dolin, MD;  Location: [facility];  Service: Endoscopy;  Laterality: N/A;  . Balloon dilation N/A 02/02/2014    Procedure: BALLOON DILATION;  Surgeon: Daneil Dolin, MD;  Location: [facility];   Service: Endoscopy;  Laterality: N/A;  . Biliary stent placement N/A 02/02/2014    Procedure: BILIARY STENT PLACEMENT;  Surgeon: Daneil Dolin, MD;  Location: [facility];  Service: Endoscopy;  Laterality: N/A;  . Spyglass cholangioscopy N/A 02/02/2014    Procedure: YIAXKPVV CHOLANGIOSCOPY;  Surgeon: Daneil Dolin, MD;  Location: [facility];  Service: Endoscopy;  Laterality: N/A;  . Esophagogastroduodenoscopy N/A 02/05/2014    Procedure: ESOPHAGOGASTRODUODENOSCOPY (EGD);  Surgeon: [name], MD;  Location: [facility];  Service: Endoscopy;  Laterality: N/A;   Family History  Problem Relation Age of Onset  . Diabetes Mother    History  Substance Use Topics  . Smoking status: Never Smoker   . Smokeless tobacco: Never Used  . Alcohol Use: No   OB History    No data available     Review of Systems  Constitutional: Negative for appetite change and fatigue.  HENT: Negative for congestion, ear discharge and sinus pressure.   Eyes: Negative for discharge.  Respiratory: Negative for cough.   Cardiovascular: Negative for chest pain.  Gastrointestinal: Positive for abdominal pain. Negative for diarrhea.  Genitourinary: Negative for frequency and hematuria.  Musculoskeletal: Negative for back pain.       Right hip pain  Skin: Negative for rash.  Neurological: Negative for seizures and headaches.  Psychiatric/Behavioral: Negative for hallucinations.      Allergies  Review of patient's allergies indicates  no known allergies.  Home Medications   Prior to Admission medications   Medication Sig Start Date End Date Taking? Authorizing Provider  aspirin 325 MG tablet Take 1 tablet (325 mg total) by mouth daily. 02/08/14  Yes [name], DO  Calcium Citrate-Vitamin D 200-250 MG-UNIT TABS Take 1 tablet by mouth daily.   Yes Historical Provider, MD  feeding supplement, ENSURE COMPLETE, (ENSURE COMPLETE) LIQD Take 237 mLs by mouth 4 (four) times daily -  with meals and at bedtime. 02/08/14   Yes [name], DO  ferrous sulfate 325 (65 FE) MG tablet Take 325 mg by mouth daily with breakfast.   Yes Historical Provider, MD  HYDROcodone-acetaminophen (NORCO/VICODIN) 5-325 MG per tablet Take 1 tablet by mouth every 6 (six) hours as needed for moderate pain. 02/08/14  Yes [name], DO  levETIRAcetam (KEPPRA) 250 MG tablet Take 1 tablet (250 mg total) by mouth daily. Patient taking differently: Take 250 mg by mouth at bedtime.  02/09/14  Yes [name], DO  levothyroxine (SYNTHROID, LEVOTHROID) 25 MCG tablet Take 25 mcg by mouth daily before breakfast.   Yes Historical Provider, MD  metroNIDAZOLE (FLAGYL) 500 MG tablet Take 1 tablet (500 mg total) by mouth 3 (three) times daily. For 14 days 07/28/14  Yes Estela Leonie Green, MD  montelukast (SINGULAIR) 10 MG tablet Take 10 mg by mouth every morning.    Yes Historical Provider, MD  Multiple Vitamin (DAILY VITAMIN PO) Take 1 tablet by mouth daily.   Yes Historical Provider, MD  NONFORMULARY OR COMPOUNDED ITEM Apply 1-2 application topically 4 (four) times daily as needed (for pain). BAC/GAB/LID-PRIL OINTMENT   Yes Historical Provider, MD  pantoprazole (PROTONIX) 40 MG tablet Take 40 mg by mouth daily.   Yes Historical Provider, MD  polyethylene glycol (MIRALAX / GLYCOLAX) packet Take 17 g by mouth daily as needed for mild constipation.   Yes Historical Provider, MD  solifenacin (VESICARE) 5 MG tablet Take 5 mg by mouth daily.   Yes Historical Provider, MD  tamsulosin (FLOMAX) 0.4 MG CAPS capsule Take 1 capsule (0.4 mg total) by mouth daily. Patient taking differently: Take 0.4 mg by mouth daily after supper.  02/08/14  Yes [name], DO  theophylline (UNIPHYL) 400 MG 24 hr tablet Take 400 mg by mouth daily.   Yes Historical Provider, MD  zolpidem (AMBIEN) 5 MG tablet Take 5 mg by mouth at bedtime as needed for sleep.    Yes Historical Provider, MD   BP 153/72 mmHg  Pulse 87  Temp(Src) 98 F (36.7 C) (Oral)  Resp 18   Ht 5' (1.524 m)  Wt 115 lb (52.164 kg)  BMI 22.46 kg/m2  SpO2 98% Physical Exam  Constitutional: She is oriented to person, place, and time. She appears well-developed.  HENT:  Head: Normocephalic.  Eyes: Conjunctivae and EOM are normal. No scleral icterus.  Neck: Neck supple. No thyromegaly present.  Cardiovascular: Normal rate and regular rhythm.  Exam reveals no gallop and no friction rub.   No murmur heard. Pulmonary/Chest: No stridor. She has no wheezes. She has no rales. She exhibits no tenderness.  Abdominal: She exhibits no distension. There is no tenderness. There is no rebound.  Musculoskeletal: She exhibits no edema.  Mild right hip tender  Lymphadenopathy:    She has no cervical adenopathy.  Neurological: She is oriented to person, place, and time. She exhibits normal muscle tone. Coordination normal.  Skin: No rash noted. No erythema.  Psychiatric: She has a normal  mood and affect. Her behavior is normal.    ED Course  Procedures (including critical care time) Labs Review Labs Reviewed - No data to display  Imaging Review Ct Hip Right Wo Contrast  08/10/2014   CLINICAL DATA:  [place] [address] from wheelchair 3 days ago, with persistent right hip pain. Initial encounter.  EXAM: CT OF THE RIGHT HIP WITHOUT CONTRAST  TECHNIQUE: Multidetector CT imaging of the right hip was performed according to the standard protocol. Multiplanar CT image reconstructions were also generated.  COMPARISON:  Right hip radiographs performed earlier today at 03:26 p.m., and CT of the abdomen and pelvis performed 06/09/2014  FINDINGS: There is no definite evidence of fracture or dislocation. The right femoral head remains seated at the acetabulum. The right hip joint space is grossly preserved. Mild degenerative change is noted at the pubic symphysis. Mild sclerotic change is seen at the right sacroiliac joint. Mild degenerative change is also noted at the lower lumbar spine. A degenerative subcortical cyst  is noted at the anterior aspect of the acetabulum.  Minimal soft tissue injury is noted overlying the right hip. No hip joint effusion is seen. The bladder is moderately distended and grossly unremarkable. Scattered calcification is noted about the uterus. Visualized small and large bowel loops are grossly unremarkable. Scattered vascular calcifications are seen.  IMPRESSION: 1. No definite evidence of fracture or dislocation. 2. Mild degenerative change at the pubic symphysis and right sacroiliac joint. Degenerative change along the lower lumbar spine. 3. Minimal soft tissue injury overlying the right hip. 4. Scattered vascular calcifications seen.   Electronically Signed   By: Garald Balding M.D.   On: 08/10/2014 18:46   Dg Hip Unilat With Pelvis 2-3 Views Right  08/10/2014   CLINICAL DATA:  Right-sided hip pain following fall from wheelchair, initial encounter  EXAM: RIGHT HIP (WITH PELVIS) 2-3 VIEWS  COMPARISON:  None.  FINDINGS: No acute fracture or dislocation is noted. The pelvic ring is visualized is intact. Mild pubic symphysis degenerative changes are seen. Diffuse vascular calcifications are noted.  IMPRESSION: No acute abnormality noted.   Electronically Signed   By: Inez Catalina M.D.   On: 08/10/2014 16:20     EKG Interpretation None      MDM   Final diagnoses:  Pain  Contusion, hip, right, initial encounter    Contusion right hip     Milton Ferguson, MD 08/10/14 1859

## 2014-08-23 DIAGNOSIS — H3531 Nonexudative age-related macular degeneration: Secondary | ICD-10-CM | POA: Diagnosis not present

## 2014-08-30 NOTE — Assessment & Plan Note (Signed)
Historically, increasing kappa light chains with increasing ratio.    We will update labs today.

## 2014-08-30 NOTE — Progress Notes (Signed)
-  No show-  [name]  

## 2014-08-31 ENCOUNTER — Ambulatory Visit (HOSPITAL_COMMUNITY): Payer: Medicare Other | Admitting: Oncology

## 2014-09-14 ENCOUNTER — Emergency Department (HOSPITAL_COMMUNITY)
Admission: EM | Admit: 2014-09-14 | Discharge: 2014-09-14 | Disposition: A | Discharge status: Home / Self Care | Payer: Medicare Other | Attending: Emergency Medicine | Admitting: Emergency Medicine

## 2014-09-14 ENCOUNTER — Encounter (HOSPITAL_COMMUNITY): Payer: Self-pay

## 2014-09-14 ENCOUNTER — Emergency Department (HOSPITAL_COMMUNITY): Payer: Medicare Other

## 2014-09-14 DIAGNOSIS — Z79899 Other long term (current) drug therapy: Secondary | ICD-10-CM | POA: Insufficient documentation

## 2014-09-14 DIAGNOSIS — S0093XA Contusion of unspecified part of head, initial encounter: Secondary | ICD-10-CM | POA: Diagnosis not present

## 2014-09-14 DIAGNOSIS — Z8589 Personal history of malignant neoplasm of other organs and systems: Secondary | ICD-10-CM | POA: Insufficient documentation

## 2014-09-14 DIAGNOSIS — F0390 Unspecified dementia without behavioral disturbance: Secondary | ICD-10-CM | POA: Insufficient documentation

## 2014-09-14 DIAGNOSIS — I10 Essential (primary) hypertension: Secondary | ICD-10-CM | POA: Diagnosis not present

## 2014-09-14 DIAGNOSIS — Y998 Other external cause status: Secondary | ICD-10-CM | POA: Diagnosis not present

## 2014-09-14 DIAGNOSIS — J449 Chronic obstructive pulmonary disease, unspecified: Secondary | ICD-10-CM | POA: Insufficient documentation

## 2014-09-14 DIAGNOSIS — S0993XA Unspecified injury of face, initial encounter: Secondary | ICD-10-CM | POA: Diagnosis not present

## 2014-09-14 DIAGNOSIS — W19XXXA Unspecified fall, initial encounter: Secondary | ICD-10-CM

## 2014-09-14 DIAGNOSIS — Y9289 Other specified places as the place of occurrence of the external cause: Secondary | ICD-10-CM | POA: Insufficient documentation

## 2014-09-14 DIAGNOSIS — Z8673 Personal history of transient ischemic attack (TIA), and cerebral infarction without residual deficits: Secondary | ICD-10-CM | POA: Insufficient documentation

## 2014-09-14 DIAGNOSIS — Z8719 Personal history of other diseases of the digestive system: Secondary | ICD-10-CM | POA: Diagnosis not present

## 2014-09-14 DIAGNOSIS — Z7982 Long term (current) use of aspirin: Secondary | ICD-10-CM | POA: Insufficient documentation

## 2014-09-14 DIAGNOSIS — E119 Type 2 diabetes mellitus without complications: Secondary | ICD-10-CM | POA: Insufficient documentation

## 2014-09-14 DIAGNOSIS — Y9389 Activity, other specified: Secondary | ICD-10-CM | POA: Diagnosis not present

## 2014-09-14 DIAGNOSIS — E039 Hypothyroidism, unspecified: Secondary | ICD-10-CM | POA: Diagnosis not present

## 2014-09-14 DIAGNOSIS — M1990 Unspecified osteoarthritis, unspecified site: Secondary | ICD-10-CM | POA: Diagnosis not present

## 2014-09-14 DIAGNOSIS — S0083XA Contusion of other part of head, initial encounter: Secondary | ICD-10-CM | POA: Diagnosis not present

## 2014-09-14 DIAGNOSIS — T148 Other injury of unspecified body region: Secondary | ICD-10-CM | POA: Diagnosis not present

## 2014-09-14 DIAGNOSIS — S0990XA Unspecified injury of head, initial encounter: Secondary | ICD-10-CM | POA: Diagnosis present

## 2014-09-14 DIAGNOSIS — W01198A Fall on same level from slipping, tripping and stumbling with subsequent striking against other object, initial encounter: Secondary | ICD-10-CM | POA: Insufficient documentation

## 2014-09-14 NOTE — ED Notes (Signed)
Patient with no complaints at this time. Respirations even and unlabored. Skin warm/dry. Report called to facility. Patient given opportunity to voice concerns/ask questions.  Patient discharged at this time and left Emergency Department in w/c.

## 2014-09-14 NOTE — ED Provider Notes (Signed)
CSN: [id number]     Arrival date & time 09/14/14  2440 History   First MD Initiated Contact with Patient 09/14/14 [phone]     Chief Complaint  Patient presents with  . Fall     (Consider location/radiation/quality/duration/timing/severity/associated sxs/prior Treatment) HPI.... Level V caveat for dementia.   Patient apparently fell out of her recliner striking her right forehead.  No loss of consciousness, neck pain, truncal or extremity pain. Patient is bedridden. No other new injuries. EMS reports baseline behavior.  Past Medical History  Diagnosis Date  . Urinary retention   . Gastritis   . Esophageal ulcer   . Salmonella sepsis   . Degenerative disk disease   . Spinal stenosis   . Asthma   . COPD (chronic obstructive pulmonary disease)   . Diabetes mellitus, type 2   . Diverticulosis of colon   . GERD (gastroesophageal reflux disease)   . Hypertension   . Osteoarthritis   . Hiatal hernia   . Pancreatitis   . S/P endoscopy 2009    Dr. Oneida Alar: gastritis  . Diverticulitis   . S/P endoscopy March 2012    Dr. Gala Romney: NSAID induced ulcer  . Hypothyroidism   . MGUS (monoclonal gammopathy of unknown significance)   . Stroke    Past Surgical History  Procedure Laterality Date  . Cervical fusion    . Cholecystectomy    . Ercp N/A 02/02/2014    Procedure: ENDOSCOPIC RETROGRADE CHOLANGIOPANCREATOGRAPHY (ERCP), STONE BASKET EXTRACTION;  Surgeon: Daneil Dolin, MD;  Location: [facility];  Service: Endoscopy;  Laterality: N/A;  . Sphincterotomy N/A 02/02/2014    Procedure: SPHINCTEROTOMY;  Surgeon: Daneil Dolin, MD;  Location: [facility];  Service: Endoscopy;  Laterality: N/A;  . Balloon dilation N/A 02/02/2014    Procedure: BALLOON DILATION;  Surgeon: Daneil Dolin, MD;  Location: [facility];  Service: Endoscopy;  Laterality: N/A;  . Biliary stent placement N/A 02/02/2014    Procedure: BILIARY STENT PLACEMENT;  Surgeon: Daneil Dolin, MD;  Location: [facility];  Service: Endoscopy;  Laterality:  N/A;  . Spyglass cholangioscopy N/A 02/02/2014    Procedure: OZDGUYQI CHOLANGIOSCOPY;  Surgeon: Daneil Dolin, MD;  Location: [facility];  Service: Endoscopy;  Laterality: N/A;  . Esophagogastroduodenoscopy N/A 02/05/2014    Procedure: ESOPHAGOGASTRODUODENOSCOPY (EGD);  Surgeon: [name], MD;  [facility];  Service: Endoscopy;  Laterality: N/A;   Family History  Problem Relation Age of Onset  . Diabetes Mother    History  Substance Use Topics  . Smoking status: Never Smoker   . Smokeless tobacco: Never Used  . Alcohol Use: No   OB History    No data available     Review of Systems  Unable to perform ROS: Dementia      Allergies  Review of patient's allergies indicates no known allergies.  Home Medications   Prior to Admission medications   Medication Sig Start Date End Date Taking? Authorizing Provider  aspirin 325 MG tablet Take 1 tablet (325 mg total) by mouth daily. 02/08/14  Yes [name], DO  Calcium Citrate-Vitamin D 200-250 MG-UNIT TABS Take 1 tablet by mouth daily.   Yes Historical Provider, MD  feeding supplement, ENSURE COMPLETE, (ENSURE COMPLETE) LIQD Take 237 mLs by mouth 4 (four) times daily -  with meals and at bedtime. 02/08/14  Yes [name], DO  ferrous sulfate 325 (65 FE) MG tablet Take 325 mg by mouth daily with breakfast.   Yes Historical Provider, MD  HYDROcodone-acetaminophen (NORCO/VICODIN) 5-325 MG per tablet Take 1 tablet by mouth every 6 (six) hours as needed for moderate pain. 02/08/14  Yes [name], DO  levETIRAcetam (KEPPRA) 250 MG tablet Take 1 tablet (250 mg total) by mouth daily. Patient taking differently: Take 250 mg by mouth at bedtime.  02/09/14  Yes [name], DO  levothyroxine (SYNTHROID, LEVOTHROID) 25 MCG tablet Take 25 mcg by mouth daily before breakfast.   Yes Historical Provider, MD  montelukast (SINGULAIR) 10 MG tablet Take 10 mg by mouth every morning.    Yes Historical Provider, MD  Multiple  Vitamin (DAILY VITAMIN PO) Take 1 tablet by mouth daily.   Yes Historical Provider, MD  NONFORMULARY OR COMPOUNDED ITEM Apply 1-2 application topically 4 (four) times daily as needed (for pain). BAC/GAB/LID-PRIL OINTMENT   Yes Historical Provider, MD  polyethylene glycol (MIRALAX / GLYCOLAX) packet Take 17 g by mouth daily as needed for mild constipation.   Yes Historical Provider, MD  solifenacin (VESICARE) 5 MG tablet Take 5 mg by mouth daily.   Yes Historical Provider, MD  tamsulosin (FLOMAX) 0.4 MG CAPS capsule Take 1 capsule (0.4 mg total) by mouth daily. Patient taking differently: Take 0.4 mg by mouth daily after supper.  02/08/14  Yes [name], DO  theophylline (UNIPHYL) 400 MG 24 hr tablet Take 400 mg by mouth daily.   Yes Historical Provider, MD  zolpidem (AMBIEN) 5 MG tablet Take 5 mg by mouth at bedtime as needed for sleep.    Yes Historical Provider, MD  metroNIDAZOLE (FLAGYL) 500 MG tablet Take 1 tablet (500 mg total) by mouth 3 (three) times daily. For 14 days Patient not taking: Reported on 09/14/2014 07/28/14   [name], MD   BP 191/85 mmHg  Pulse 93  Temp(Src) 97.9 F (36.6 C) (Oral)  Resp 14  Ht 5\' 2"  (1.575 m)  Wt 110 lb (49.896 kg)  BMI 20.11 kg/m2  SpO2 98% Physical Exam  Constitutional:  Elderly, no acute distress  HENT:  Head: Normocephalic.  2 cm right frontal hematoma  Eyes: Conjunctivae and EOM are normal. Pupils are equal, round, and reactive to light.  Neck: Normal range of motion. Neck supple.  Cardiovascular: Normal rate and regular rhythm.   Pulmonary/Chest: Effort normal and breath sounds normal.  Abdominal: Soft. Bowel sounds are normal.  Musculoskeletal:  Difficult to assess  Neurological:  Difficult to assess  Skin: Skin is warm and dry.  Psychiatric:  Demented  Nursing note and vitals reviewed.   ED Course  Procedures (including critical care time) Labs Review Labs Reviewed - No data to display  Imaging Review Ct  Head Wo Contrast  09/14/2014   CLINICAL DATA:  Fall.  Head trauma.  RIGHT forehead hematoma.  EXAM: CT HEAD WITHOUT CONTRAST  TECHNIQUE: Contiguous axial images were obtained from the base of the skull through the vertex without intravenous contrast.  COMPARISON:  08/08/2014.  FINDINGS: No mass lesion, mass effect, midline shift, hydrocephalus, hemorrhage. No acute territorial cortical ischemia/infarct. Atrophy and chronic ischemic white matter disease is present. Benign basal ganglia calcifications are present.  RIGHT forehead hematoma is present. The amount of atrophy is less than expected for age. The calvarium is intact. Mild motion artifact is present on the examination.  IMPRESSION: 1. Atrophy and chronic ischemic white matter disease without acute intracranial abnormality. 2. RIGHT forehead soft tissue hematoma.   Electronically Signed   By: Dereck Ligas M.D.   On: 09/14/2014 09:18  EKG Interpretation None      MDM   Final diagnoses:  Fall, initial encounter  Head contusion, initial encounter    Patient appears to be at baseline. CT head shows no acute findings. Will return to nursing home.    [name], MD 09/14/14 [phone]

## 2014-09-14 NOTE — Discharge Instructions (Signed)
CT head shows no subdural hematoma. Follow-up with primary care physician.

## 2014-09-14 NOTE — ED Notes (Signed)
Report given to Butch Penny at [facility]. She will send transportation as soon as they arrive back to facility.

## 2014-09-14 NOTE — ED Notes (Signed)
Changed of wet pull-up. Cleaned perineum.

## 2014-09-14 NOTE — ED Notes (Signed)
Verified BP w/manual reading. Dr. Lacinda Axon notified of elevated pressure.

## 2014-09-14 NOTE — ED Notes (Addendum)
Patient via [facility] from [facility] after a fall. Patient presents with c-collar in place and a hematoma to right forehead. Patient is alert at baseline per ems. Patient also has bilateral +3 edema to bilateral legs. Patient does not ambulate per staff at [facility].

## 2014-09-22 ENCOUNTER — Emergency Department (HOSPITAL_COMMUNITY)
Admission: EM | Admit: 2014-09-22 | Discharge: 2014-09-22 | Disposition: A | Discharge status: Home / Self Care | Payer: Medicare Other | Attending: Emergency Medicine | Admitting: Emergency Medicine

## 2014-09-22 ENCOUNTER — Encounter (HOSPITAL_COMMUNITY): Payer: Self-pay | Admitting: Emergency Medicine

## 2014-09-22 DIAGNOSIS — J449 Chronic obstructive pulmonary disease, unspecified: Secondary | ICD-10-CM | POA: Insufficient documentation

## 2014-09-22 DIAGNOSIS — M1990 Unspecified osteoarthritis, unspecified site: Secondary | ICD-10-CM | POA: Diagnosis not present

## 2014-09-22 DIAGNOSIS — Y9289 Other specified places as the place of occurrence of the external cause: Secondary | ICD-10-CM | POA: Diagnosis not present

## 2014-09-22 DIAGNOSIS — Y998 Other external cause status: Secondary | ICD-10-CM | POA: Diagnosis not present

## 2014-09-22 DIAGNOSIS — W1839XA Other fall on same level, initial encounter: Secondary | ICD-10-CM | POA: Diagnosis not present

## 2014-09-22 DIAGNOSIS — Z79899 Other long term (current) drug therapy: Secondary | ICD-10-CM | POA: Diagnosis not present

## 2014-09-22 DIAGNOSIS — N390 Urinary tract infection, site not specified: Secondary | ICD-10-CM | POA: Diagnosis not present

## 2014-09-22 DIAGNOSIS — Z862 Personal history of diseases of the blood and blood-forming organs and certain disorders involving the immune mechanism: Secondary | ICD-10-CM | POA: Diagnosis not present

## 2014-09-22 DIAGNOSIS — Y9389 Activity, other specified: Secondary | ICD-10-CM | POA: Diagnosis not present

## 2014-09-22 DIAGNOSIS — S0993XA Unspecified injury of face, initial encounter: Secondary | ICD-10-CM | POA: Diagnosis present

## 2014-09-22 DIAGNOSIS — E119 Type 2 diabetes mellitus without complications: Secondary | ICD-10-CM | POA: Insufficient documentation

## 2014-09-22 DIAGNOSIS — Z8673 Personal history of transient ischemic attack (TIA), and cerebral infarction without residual deficits: Secondary | ICD-10-CM | POA: Insufficient documentation

## 2014-09-22 DIAGNOSIS — F0390 Unspecified dementia without behavioral disturbance: Secondary | ICD-10-CM | POA: Insufficient documentation

## 2014-09-22 DIAGNOSIS — I10 Essential (primary) hypertension: Secondary | ICD-10-CM | POA: Diagnosis not present

## 2014-09-22 DIAGNOSIS — E039 Hypothyroidism, unspecified: Secondary | ICD-10-CM | POA: Insufficient documentation

## 2014-09-22 DIAGNOSIS — Z8719 Personal history of other diseases of the digestive system: Secondary | ICD-10-CM | POA: Diagnosis not present

## 2014-09-22 DIAGNOSIS — Z7982 Long term (current) use of aspirin: Secondary | ICD-10-CM | POA: Diagnosis not present

## 2014-09-22 DIAGNOSIS — S0511XA Contusion of eyeball and orbital tissues, right eye, initial encounter: Secondary | ICD-10-CM | POA: Diagnosis not present

## 2014-09-22 DIAGNOSIS — R40241 Glasgow coma scale score 13-15: Secondary | ICD-10-CM | POA: Diagnosis not present

## 2014-09-22 DIAGNOSIS — M545 Low back pain: Secondary | ICD-10-CM | POA: Diagnosis not present

## 2014-09-22 LAB — URINE MICROSCOPIC-ADD ON

## 2014-09-22 LAB — URINALYSIS, ROUTINE W REFLEX MICROSCOPIC
Bilirubin Urine: NEGATIVE
GLUCOSE, UA: NEGATIVE mg/dL
Ketones, ur: NEGATIVE mg/dL
LEUKOCYTES UA: NEGATIVE
Nitrite: POSITIVE — AB
PH: 5.5 (ref 5.0–8.0)
PROTEIN: NEGATIVE mg/dL
SPECIFIC GRAVITY, URINE: 1.02 (ref 1.005–1.030)
Urobilinogen, UA: 0.2 mg/dL (ref 0.0–1.0)

## 2014-09-22 MED ORDER — CEPHALEXIN 500 MG PO CAPS: 500.0000 mg | ORAL_CAPSULE | Freq: Four times a day (QID) | ORAL | Status: DC

## 2014-09-22 NOTE — Discharge Instructions (Signed)

## 2014-09-22 NOTE — ED Notes (Signed)
Pt up without difficulty and ambulated in room.  No complaints.

## 2014-09-22 NOTE — ED Notes (Signed)
Pt. From [facility]. Pt. Reportedly slipped out of recliner this morning. Per EMS bruising around eye and with a bruise on forehead from fall last week. Pt. Alert and able to follow commands.

## 2014-09-22 NOTE — ED Notes (Signed)
Pt. States "they are trying to kill me but I am not ready to go yet" Pt. Is a ward of the state. EMS stating that they are contacting Adult Protective Services. Pts. Social worker information at bedside.

## 2014-09-22 NOTE — ED Provider Notes (Addendum)
CSN: [id number]     Arrival date & time 09/22/14  4174 History   First MD Initiated Contact with Patient 09/22/14 [phone]     Chief Complaint  Patient presents with  . Fall     (Consider location/radiation/quality/duration/timing/severity/associated sxs/prior Treatment) HPI Comments: Patient presents to the ER for evaluation after a fall. Patient reportedly slipped out of her recliner this morning. Patient arrives to the emergency department without complaints. Patient does have a history of dementia, cannot answer questions appropriately.Level V Caveat due to dementia.  Patient is a 81 y.o. female presenting with fall.  Fall    Past Medical History  Diagnosis Date  . Urinary retention   . Gastritis   . Esophageal ulcer   . Salmonella sepsis   . Degenerative disk disease   . Spinal stenosis   . Asthma   . COPD (chronic obstructive pulmonary disease)   . Diabetes mellitus, type 2   . Diverticulosis of colon   . GERD (gastroesophageal reflux disease)   . Hypertension   . Osteoarthritis   . Hiatal hernia   . Pancreatitis   . S/P endoscopy 2009    Dr. Oneida Alar: gastritis  . Diverticulitis   . S/P endoscopy March 2012    Dr. Gala Romney: NSAID induced ulcer  . Hypothyroidism   . MGUS (monoclonal gammopathy of unknown significance)   . Stroke    Past Surgical History  Procedure Laterality Date  . Cervical fusion    . Cholecystectomy    . Ercp N/A 02/02/2014    Procedure: ENDOSCOPIC RETROGRADE CHOLANGIOPANCREATOGRAPHY (ERCP), STONE BASKET EXTRACTION;  Surgeon: Daneil Dolin, MD;  Location: [facility];  Service: Endoscopy;  Laterality: N/A;  . Sphincterotomy N/A 02/02/2014    Procedure: SPHINCTEROTOMY;  Surgeon: Daneil Dolin, MD;  Location: [facility];  Service: Endoscopy;  Laterality: N/A;  . Balloon dilation N/A 02/02/2014    Procedure: BALLOON DILATION;  Surgeon: Daneil Dolin, MD;  Location: [facility];  Service: Endoscopy;  Laterality: N/A;  . Biliary stent placement N/A 02/02/2014   Procedure: BILIARY STENT PLACEMENT;  Surgeon: Daneil Dolin, MD;  Location: [facility];  Service: Endoscopy;  Laterality: N/A;  . Spyglass cholangioscopy N/A 02/02/2014    Procedure: GYJEHUDJ CHOLANGIOSCOPY;  Surgeon: Daneil Dolin, MD;  Location: [facility];  Service: Endoscopy;  Laterality: N/A;  . Esophagogastroduodenoscopy N/A 02/05/2014    Procedure: ESOPHAGOGASTRODUODENOSCOPY (EGD);  Surgeon: [name], MD;  [facility];  Service: Endoscopy;  Laterality: N/A;   Family History  Problem Relation Age of Onset  . Diabetes Mother    History  Substance Use Topics  . Smoking status: Never Smoker   . Smokeless tobacco: Never Used  . Alcohol Use: No   OB History    No data available     Review of Systems  Unable to perform ROS: Dementia      Allergies  Review of patient's allergies indicates no known allergies.  Home Medications   Prior to Admission medications   Medication Sig Start Date End Date Taking? Authorizing Provider  aspirin 325 MG tablet Take 1 tablet (325 mg total) by mouth daily. 02/08/14   [name], DO  Calcium Citrate-Vitamin D 200-250 MG-UNIT TABS Take 1 tablet by mouth daily.    Historical Provider, MD  feeding supplement, ENSURE COMPLETE, (ENSURE COMPLETE) LIQD Take 237 mLs by mouth 4 (four) times daily -  with meals and at bedtime. 02/08/14   [name], DO  ferrous sulfate 325 (65 FE)  MG tablet Take 325 mg by mouth daily with breakfast.    Historical Provider, MD  HYDROcodone-acetaminophen (NORCO/VICODIN) 5-325 MG per tablet Take 1 tablet by mouth every 6 (six) hours as needed for moderate pain. 02/08/14   [name], DO  levETIRAcetam (KEPPRA) 250 MG tablet Take 1 tablet (250 mg total) by mouth daily. Patient taking differently: Take 250 mg by mouth at bedtime.  02/09/14   [name], DO  levothyroxine (SYNTHROID, LEVOTHROID) 25 MCG tablet Take 25 mcg by mouth daily before breakfast.    Historical Provider, MD  metroNIDAZOLE  (FLAGYL) 500 MG tablet Take 1 tablet (500 mg total) by mouth 3 (three) times daily. For 14 days Patient not taking: Reported on 09/14/2014 07/28/14   [name], MD  montelukast (SINGULAIR) 10 MG tablet Take 10 mg by mouth every morning.     Historical Provider, MD  Multiple Vitamin (DAILY VITAMIN PO) Take 1 tablet by mouth daily.    Historical Provider, MD  NONFORMULARY OR COMPOUNDED ITEM Apply 1-2 application topically 4 (four) times daily as needed (for pain). BAC/GAB/LID-PRIL OINTMENT    Historical Provider, MD  polyethylene glycol (MIRALAX / GLYCOLAX) packet Take 17 g by mouth daily as needed for mild constipation.    Historical Provider, MD  solifenacin (VESICARE) 5 MG tablet Take 5 mg by mouth daily.    Historical Provider, MD  tamsulosin (FLOMAX) 0.4 MG CAPS capsule Take 1 capsule (0.4 mg total) by mouth daily. Patient taking differently: Take 0.4 mg by mouth daily after supper.  02/08/14   [name], DO  theophylline (UNIPHYL) 400 MG 24 hr tablet Take 400 mg by mouth daily.    Historical Provider, MD  zolpidem (AMBIEN) 5 MG tablet Take 5 mg by mouth at bedtime as needed for sleep.     Historical Provider, MD   BP 156/104 mmHg  Pulse 90  Temp(Src) 97.8 F (36.6 C) (Oral)  Resp 16  SpO2 96% Physical Exam  Constitutional: She is oriented to person, place, and time. She appears well-developed and well-nourished. No distress.  HENT:  Head: Normocephalic and atraumatic.  Right Ear: Hearing normal.  Left Ear: Hearing normal.  Nose: Nose normal.  Mouth/Throat: Oropharynx is clear and moist and mucous membranes are normal.  Eyes: Conjunctivae and EOM are normal. Pupils are equal, round, and reactive to light.  Neck: Normal range of motion. Neck supple.  Cardiovascular: Regular rhythm, S1 normal and S2 normal.  Exam reveals no gallop and no friction rub.   No murmur heard. Pulmonary/Chest: Effort normal and breath sounds normal. No respiratory distress. She exhibits no  tenderness.  Abdominal: Soft. Normal appearance and bowel sounds are normal. There is no hepatosplenomegaly. There is no tenderness. There is no rebound, no guarding, no tenderness at McBurney's point and negative Murphy's sign. No hernia.  Musculoskeletal: Normal range of motion.  Neurological: She is alert and oriented to person, place, and time. She has normal strength. No cranial nerve deficit or sensory deficit. Coordination normal. GCS eye subscore is 4. GCS verbal subscore is 5. GCS motor subscore is 6.  Skin: Skin is warm, dry and intact. Ecchymosis noted. No rash noted. No cyanosis.     Psychiatric: She has a normal mood and affect. Her speech is normal and behavior is normal. Thought content normal.  Nursing note and vitals reviewed.   ED Course  Procedures (including critical care time) Labs Review Labs Reviewed - No data to display  Imaging Review No results found.  EKG Interpretation None      MDM   Final diagnoses:  None   fall UTI  Presents to the emergency department for evaluation after a fall. Patient reportedly slipped from her recliner this morning. This is the second time in approximately a week this has occurred. She does have some bruising around her right eye which corresponds to a hematoma she suffered from her last fall. There does not appear to be any new injuries today. She is without complaints. Examination does not reveal any areas of new hematoma, abrasion, skin tear, laceration. All 4 extremities are moving equally. She appears to be at her mental status baseline. No imaging is indicated at this time.  Nursing home ask for urinalysis because patient had some foul-smelling urine. Urinalysis does suggest infection. Will initiate treatment.  Orpah Greek, MD 09/22/14 0938  Orpah Greek, MD 09/22/14 [phone]

## 2014-09-24 LAB — URINE CULTURE: Colony Count: >=100000

## 2014-09-25 ENCOUNTER — Telehealth (HOSPITAL_COMMUNITY): Payer: Self-pay

## 2014-09-25 NOTE — Telephone Encounter (Signed)
Post ED Visit - Positive Culture Follow-up  Culture report reviewed by antimicrobial stewardship pharmacist: []  Wes [place], Pharm.D., BCPS []  Heide Guile, Pharm.D., BCPS []  Alycia Rossetti, Pharm.D., BCPS []  [place], Pharm.D., BCPS, AAHIVP []  Legrand [place]o, Pharm.D., BCPS, AAHIVP []  Isac Sarna, Pharm.D., BCPS X  Tegan Magsam, Pharm D  Positive Urine culture, >/= 100,000 colonies -> Klebsiella Pneumoniae Treated with Keflex, organism sensitive to the same and no further patient follow-up is required at this time.  Dortha Kern 09/25/2014, 7:57 PM

## 2014-10-02 DIAGNOSIS — Z681 Body mass index (BMI) 19 or less, adult: Secondary | ICD-10-CM | POA: Diagnosis not present

## 2014-10-02 DIAGNOSIS — H6123 Impacted cerumen, bilateral: Secondary | ICD-10-CM | POA: Diagnosis not present

## 2014-10-02 DIAGNOSIS — N390 Urinary tract infection, site not specified: Secondary | ICD-10-CM | POA: Diagnosis not present

## 2014-10-04 DIAGNOSIS — N390 Urinary tract infection, site not specified: Secondary | ICD-10-CM | POA: Diagnosis not present

## 2014-10-09 DIAGNOSIS — H6123 Impacted cerumen, bilateral: Secondary | ICD-10-CM | POA: Diagnosis not present

## 2014-10-09 DIAGNOSIS — Z681 Body mass index (BMI) 19 or less, adult: Secondary | ICD-10-CM | POA: Diagnosis not present

## 2014-10-27 ENCOUNTER — Emergency Department (HOSPITAL_COMMUNITY)
Admission: EM | Admit: 2014-10-27 | Discharge: 2014-10-27 | Disposition: A | Discharge status: Home / Self Care | Payer: Medicare Other | Attending: Emergency Medicine | Admitting: Emergency Medicine

## 2014-10-27 ENCOUNTER — Emergency Department (HOSPITAL_COMMUNITY): Payer: Medicare Other

## 2014-10-27 ENCOUNTER — Encounter (HOSPITAL_COMMUNITY): Payer: Self-pay | Admitting: *Deleted

## 2014-10-27 DIAGNOSIS — Z8619 Personal history of other infectious and parasitic diseases: Secondary | ICD-10-CM | POA: Insufficient documentation

## 2014-10-27 DIAGNOSIS — Z86018 Personal history of other benign neoplasm: Secondary | ICD-10-CM | POA: Diagnosis not present

## 2014-10-27 DIAGNOSIS — N390 Urinary tract infection, site not specified: Secondary | ICD-10-CM | POA: Insufficient documentation

## 2014-10-27 DIAGNOSIS — Z7982 Long term (current) use of aspirin: Secondary | ICD-10-CM | POA: Insufficient documentation

## 2014-10-27 DIAGNOSIS — R4182 Altered mental status, unspecified: Secondary | ICD-10-CM | POA: Diagnosis not present

## 2014-10-27 DIAGNOSIS — Z8719 Personal history of other diseases of the digestive system: Secondary | ICD-10-CM | POA: Insufficient documentation

## 2014-10-27 DIAGNOSIS — Z8673 Personal history of transient ischemic attack (TIA), and cerebral infarction without residual deficits: Secondary | ICD-10-CM | POA: Insufficient documentation

## 2014-10-27 DIAGNOSIS — F0390 Unspecified dementia without behavioral disturbance: Secondary | ICD-10-CM | POA: Insufficient documentation

## 2014-10-27 DIAGNOSIS — Z79899 Other long term (current) drug therapy: Secondary | ICD-10-CM | POA: Insufficient documentation

## 2014-10-27 DIAGNOSIS — Z8739 Personal history of other diseases of the musculoskeletal system and connective tissue: Secondary | ICD-10-CM | POA: Diagnosis not present

## 2014-10-27 DIAGNOSIS — E119 Type 2 diabetes mellitus without complications: Secondary | ICD-10-CM | POA: Diagnosis not present

## 2014-10-27 DIAGNOSIS — J449 Chronic obstructive pulmonary disease, unspecified: Secondary | ICD-10-CM | POA: Diagnosis not present

## 2014-10-27 DIAGNOSIS — I10 Essential (primary) hypertension: Secondary | ICD-10-CM | POA: Insufficient documentation

## 2014-10-27 DIAGNOSIS — M1990 Unspecified osteoarthritis, unspecified site: Secondary | ICD-10-CM | POA: Diagnosis not present

## 2014-10-27 DIAGNOSIS — E039 Hypothyroidism, unspecified: Secondary | ICD-10-CM | POA: Insufficient documentation

## 2014-10-27 LAB — I-STAT CHEM 8, ED
BUN: 23 mg/dL — ABNORMAL HIGH (ref 6–20)
CALCIUM ION: 1.21 mmol/L (ref 1.13–1.30)
CREATININE: 0.9 mg/dL (ref 0.44–1.00)
Chloride: 107 mmol/L (ref 101–111)
Glucose, Bld: 96 mg/dL (ref 65–99)
HCT: 41 % (ref 36.0–46.0)
Hemoglobin: 13.9 g/dL (ref 12.0–15.0)
Potassium: 4.5 mmol/L (ref 3.5–5.1)
SODIUM: 142 mmol/L (ref 135–145)
TCO2: 23 mmol/L (ref 0–100)

## 2014-10-27 LAB — URINE MICROSCOPIC-ADD ON

## 2014-10-27 LAB — URINALYSIS, ROUTINE W REFLEX MICROSCOPIC
BILIRUBIN URINE: NEGATIVE
Glucose, UA: NEGATIVE mg/dL
KETONES UR: NEGATIVE mg/dL
LEUKOCYTES UA: NEGATIVE
Nitrite: NEGATIVE
PROTEIN: 30 mg/dL — AB
SPECIFIC GRAVITY, URINE: 1.02 (ref 1.005–1.030)
UROBILINOGEN UA: 0.2 mg/dL (ref 0.0–1.0)
pH: 6 (ref 5.0–8.0)

## 2014-10-27 MED ORDER — LIDOCAINE HCL (PF) 1 % IJ SOLN
INTRAMUSCULAR | Status: AC
  Filled 2014-10-27: qty 5

## 2014-10-27 MED ORDER — CEPHALEXIN 500 MG PO CAPS: 500.0000 mg | ORAL_CAPSULE | Freq: Four times a day (QID) | ORAL | Status: DC

## 2014-10-27 MED ORDER — CEFTRIAXONE SODIUM 1 G IJ SOLR
1.0000 g | Freq: Once | INTRAMUSCULAR | Status: AC
  Administered 2014-10-27: 1 g via INTRAMUSCULAR
  Filled 2014-10-27: qty 10

## 2014-10-27 NOTE — ED Provider Notes (Signed)
CSN: [id number]     Arrival date & time 10/27/14  1457 History   First MD Initiated Contact with Patient 10/27/14 1620     Chief Complaint  Patient presents with  . Altered Mental Status     (Consider location/radiation/quality/duration/timing/severity/associated sxs/prior Treatment) Patient is a 81 y.o. female presenting with altered mental status. The history is provided by the nursing home (staff states pt is confused and last time she had a uti).  Altered Mental Status Presenting symptoms: behavior changes and confusion   Severity:  Moderate Most recent episode:  Today Episode history:  Multiple Timing:  Constant Chronicity:  Recurrent Context: dementia   Context: not alcohol use     Past Medical History  Diagnosis Date  . Urinary retention   . Gastritis   . Esophageal ulcer   . Salmonella sepsis   . Degenerative disk disease   . Spinal stenosis   . Asthma   . COPD (chronic obstructive pulmonary disease)   . Diabetes mellitus, type 2   . Diverticulosis of colon   . GERD (gastroesophageal reflux disease)   . Hypertension   . Osteoarthritis   . Hiatal hernia   . Pancreatitis   . S/P endoscopy 2009    Dr. Oneida Alar: gastritis  . Diverticulitis   . S/P endoscopy March 2012    Dr. Gala Romney: NSAID induced ulcer  . Hypothyroidism   . MGUS (monoclonal gammopathy of unknown significance)   . Stroke    Past Surgical History  Procedure Laterality Date  . Cervical fusion    . Cholecystectomy    . Ercp N/A 02/02/2014    Procedure: ENDOSCOPIC RETROGRADE CHOLANGIOPANCREATOGRAPHY (ERCP), STONE BASKET EXTRACTION;  Surgeon: Daneil Dolin, MD;  Location: [facility];  Service: Endoscopy;  Laterality: N/A;  . Sphincterotomy N/A 02/02/2014    Procedure: SPHINCTEROTOMY;  Surgeon: Daneil Dolin, MD;  Location: [facility];  Service: Endoscopy;  Laterality: N/A;  . Balloon dilation N/A 02/02/2014    Procedure: BALLOON DILATION;  Surgeon: Daneil Dolin, MD;  Location: [facility];  Service: Endoscopy;   Laterality: N/A;  . Biliary stent placement N/A 02/02/2014    Procedure: BILIARY STENT PLACEMENT;  Surgeon: Daneil Dolin, MD;  Location: [facility];  Service: Endoscopy;  Laterality: N/A;  . Spyglass cholangioscopy N/A 02/02/2014    Procedure: ELFYBOFB CHOLANGIOSCOPY;  Surgeon: Daneil Dolin, MD;  Location: [facility];  Service: Endoscopy;  Laterality: N/A;  . Esophagogastroduodenoscopy N/A 02/05/2014    Procedure: ESOPHAGOGASTRODUODENOSCOPY (EGD);  Surgeon: [name], MD;  [facility];  Service: Endoscopy;  Laterality: N/A;   Family History  Problem Relation Age of Onset  . Diabetes Mother    History  Substance Use Topics  . Smoking status: Never Smoker   . Smokeless tobacco: Never Used  . Alcohol Use: No   OB History    No data available     Review of Systems  Unable to perform ROS: Mental status change  Psychiatric/Behavioral: Positive for confusion.      Allergies  Review of patient's allergies indicates no known allergies.  Home Medications   Prior to Admission medications   Medication Sig Start Date End Date Taking? Authorizing Provider  aspirin 325 MG tablet Take 1 tablet (325 mg total) by mouth daily. 02/08/14  Yes [name], DO  Calcium Citrate-Vitamin D 200-250 MG-UNIT TABS Take 1 tablet by mouth daily.   Yes Historical Provider, MD  ferrous sulfate 325 (65 FE) MG tablet Take 325 mg by  mouth daily with breakfast.   Yes Historical Provider, MD  levETIRAcetam (KEPPRA) 250 MG tablet Take 1 tablet (250 mg total) by mouth daily. Patient taking differently: Take 250 mg by mouth at bedtime.  02/09/14  Yes [name], DO  levothyroxine (SYNTHROID, LEVOTHROID) 25 MCG tablet Take 25 mcg by mouth daily before breakfast.   Yes Historical Provider, MD  montelukast (SINGULAIR) 10 MG tablet Take 10 mg by mouth every morning.    Yes Historical Provider, MD  Multiple Vitamin (DAILY VITAMIN PO) Take 1 tablet by mouth daily.   Yes Historical Provider, MD   polyethylene glycol (MIRALAX / GLYCOLAX) packet Take 17 g by mouth daily as needed for mild constipation.   Yes Historical Provider, MD  solifenacin (VESICARE) 5 MG tablet Take 5 mg by mouth daily.   Yes Historical Provider, MD  tamsulosin (FLOMAX) 0.4 MG CAPS capsule Take 1 capsule (0.4 mg total) by mouth daily. 02/08/14  Yes [name], DO  theophylline (UNIPHYL) 400 MG 24 hr tablet Take 400 mg by mouth daily.   Yes Historical Provider, MD  cephALEXin (KEFLEX) 500 MG capsule Take 1 capsule (500 mg total) by mouth 4 (four) times daily. 10/27/14   Milton Ferguson, MD  feeding supplement, ENSURE COMPLETE, (ENSURE COMPLETE) LIQD Take 237 mLs by mouth 4 (four) times daily -  with meals and at bedtime. Patient not taking: Reported on 10/27/2014 02/08/14   Cristal Ford, DO  HYDROcodone-acetaminophen (NORCO/VICODIN) 5-325 MG per tablet Take 1 tablet by mouth every 6 (six) hours as needed for moderate pain. 02/08/14   [name], DO   BP 198/92 mmHg  Pulse 94  Temp(Src) 97.7 F (36.5 C) (Oral)  Resp 20  SpO2 98% Physical Exam  Constitutional: She appears well-developed.  HENT:  Head: Normocephalic.  Eyes: Conjunctivae and EOM are normal. No scleral icterus.  Neck: Neck supple. No thyromegaly present.  Cardiovascular: Normal rate and regular rhythm.  Exam reveals no gallop and no friction rub.   No murmur heard. Pulmonary/Chest: No stridor. She has no wheezes. She has no rales. She exhibits no tenderness.  Abdominal: She exhibits no distension. There is no tenderness. There is no rebound.  Musculoskeletal: Normal range of motion. She exhibits no edema.  Lymphadenopathy:    She has no cervical adenopathy.  Neurological: She is alert. She exhibits normal muscle tone. Coordination normal.  Oriented to person only  Skin: No rash noted. No erythema.  Psychiatric: She has a normal mood and affect. Her behavior is normal.    ED Course  Procedures (including critical care time) Labs  Review Labs Reviewed  URINALYSIS, [place] (NOT AT [facility]) - Abnormal; Notable for the following:    APPearance HAZY (*)    Hgb urine dipstick TRACE (*)    Protein, ur 30 (*)    All other components within normal limits  URINE MICROSCOPIC-ADD ON - Abnormal; Notable for the following:    Bacteria, UA MANY (*)    All other components within normal limits  I-STAT CHEM 8, ED - Abnormal; Notable for the following:    BUN 23 (*)    All other components within normal limits  URINE CULTURE    Imaging Review Ct Head Wo Contrast  10/27/2014   CLINICAL DATA:  Altered mental status  EXAM: CT HEAD WITHOUT CONTRAST  TECHNIQUE: Contiguous axial images were obtained from the base of the skull through the vertex without intravenous contrast.  COMPARISON:  09/14/2014  FINDINGS: The bony calvarium is  intact. Diffuse atrophic changes are noted. Basal ganglia calcifications are seen. Changes of chronic white matter ischemic change are noted. No findings to suggest acute hemorrhage, acute infarction or space-occupying mass lesion are seen.  IMPRESSION: Chronic atrophic and ischemic changes without acute abnormality.   Electronically Signed   By: Inez Catalina M.D.   On: 10/27/2014 19:42     EKG Interpretation None      MDM   Final diagnoses:  UTI (lower urinary tract infection)    uti tx with keflex    Milton Ferguson, MD 10/27/14 2000

## 2014-10-27 NOTE — ED Notes (Signed)
Called [facility] to pick pt up. Gave report to [facility].

## 2014-10-27 NOTE — ED Notes (Signed)
Dressed patient waiting on [facility] to come and pickup the patient.

## 2014-10-27 NOTE — ED Notes (Signed)
From [facility], Caregiver states she is confused today and the last time she was confused she had a UTI, Caregiver requests she have an in-out-cath for accuracy

## 2014-10-27 NOTE — ED Notes (Signed)
Family member w/ pt. Informed of Dx & that we have called for [facility] to pick pt up.

## 2014-10-27 NOTE — Discharge Instructions (Signed)
FOLLOW UP with your md next week.

## 2014-10-27 NOTE — ED Notes (Signed)
Pt alert. [facility] given discharge instructions, paperwork & prescription(s). Caregiver verbalized understanding. Pt left department in wheelchair w/ no further questions.

## 2014-10-30 LAB — URINE CULTURE
Colony Count: >=100000
Special Requests: NORMAL

## 2014-10-31 ENCOUNTER — Telehealth: Payer: Self-pay | Admitting: Emergency Medicine

## 2014-10-31 NOTE — Telephone Encounter (Signed)
Post ED Visit - Positive Culture Follow-up  Culture report reviewed by antimicrobial stewardship pharmacist: []  Wes Clay, Pharm.D., BCPS [x]  Heide Guile, Pharm.D., BCPS []  Alycia Rossetti, Pharm.D., BCPS []  [place], Pharm.D., BCPS, AAHIVP []  Legrand Como, Pharm.D., BCPS, AAHIVP []  Isac Sarna, Pharm.D., BCPS  Positive Urine culture Treated with Cephalexin, organism sensitive to the same and no further patient follow-up is required at this time.  Georgina Peer [facility] 10/31/2014, 3:08 PM

## 2014-11-08 DIAGNOSIS — Z681 Body mass index (BMI) 19 or less, adult: Secondary | ICD-10-CM | POA: Diagnosis not present

## 2014-11-08 DIAGNOSIS — B350 Tinea barbae and tinea capitis: Secondary | ICD-10-CM | POA: Diagnosis not present

## 2014-11-08 DIAGNOSIS — N390 Urinary tract infection, site not specified: Secondary | ICD-10-CM | POA: Diagnosis not present

## 2014-11-12 ENCOUNTER — Emergency Department (HOSPITAL_COMMUNITY)
Admission: EM | Admit: 2014-11-12 | Discharge: 2014-11-12 | Disposition: A | Discharge status: Home / Self Care | Payer: Medicare Other | Attending: Emergency Medicine | Admitting: Emergency Medicine

## 2014-11-12 ENCOUNTER — Emergency Department (HOSPITAL_COMMUNITY): Payer: Medicare Other

## 2014-11-12 ENCOUNTER — Encounter (HOSPITAL_COMMUNITY): Payer: Self-pay

## 2014-11-12 DIAGNOSIS — S0081XA Abrasion of other part of head, initial encounter: Secondary | ICD-10-CM | POA: Diagnosis not present

## 2014-11-12 DIAGNOSIS — Z7982 Long term (current) use of aspirin: Secondary | ICD-10-CM | POA: Diagnosis not present

## 2014-11-12 DIAGNOSIS — Z8589 Personal history of malignant neoplasm of other organs and systems: Secondary | ICD-10-CM | POA: Diagnosis not present

## 2014-11-12 DIAGNOSIS — Z8619 Personal history of other infectious and parasitic diseases: Secondary | ICD-10-CM | POA: Diagnosis not present

## 2014-11-12 DIAGNOSIS — M1990 Unspecified osteoarthritis, unspecified site: Secondary | ICD-10-CM | POA: Insufficient documentation

## 2014-11-12 DIAGNOSIS — J449 Chronic obstructive pulmonary disease, unspecified: Secondary | ICD-10-CM | POA: Diagnosis not present

## 2014-11-12 DIAGNOSIS — Y9289 Other specified places as the place of occurrence of the external cause: Secondary | ICD-10-CM | POA: Insufficient documentation

## 2014-11-12 DIAGNOSIS — Y998 Other external cause status: Secondary | ICD-10-CM | POA: Insufficient documentation

## 2014-11-12 DIAGNOSIS — S0990XA Unspecified injury of head, initial encounter: Secondary | ICD-10-CM | POA: Diagnosis not present

## 2014-11-12 DIAGNOSIS — S6992XA Unspecified injury of left wrist, hand and finger(s), initial encounter: Secondary | ICD-10-CM | POA: Diagnosis not present

## 2014-11-12 DIAGNOSIS — E039 Hypothyroidism, unspecified: Secondary | ICD-10-CM | POA: Insufficient documentation

## 2014-11-12 DIAGNOSIS — W1839XA Other fall on same level, initial encounter: Secondary | ICD-10-CM | POA: Diagnosis not present

## 2014-11-12 DIAGNOSIS — S59902A Unspecified injury of left elbow, initial encounter: Secondary | ICD-10-CM | POA: Diagnosis not present

## 2014-11-12 DIAGNOSIS — Z9889 Other specified postprocedural states: Secondary | ICD-10-CM | POA: Insufficient documentation

## 2014-11-12 DIAGNOSIS — M25522 Pain in left elbow: Secondary | ICD-10-CM | POA: Diagnosis not present

## 2014-11-12 DIAGNOSIS — S199XXA Unspecified injury of neck, initial encounter: Secondary | ICD-10-CM | POA: Diagnosis not present

## 2014-11-12 DIAGNOSIS — Z79899 Other long term (current) drug therapy: Secondary | ICD-10-CM | POA: Insufficient documentation

## 2014-11-12 DIAGNOSIS — S60212A Contusion of left wrist, initial encounter: Secondary | ICD-10-CM | POA: Insufficient documentation

## 2014-11-12 DIAGNOSIS — E119 Type 2 diabetes mellitus without complications: Secondary | ICD-10-CM | POA: Diagnosis not present

## 2014-11-12 DIAGNOSIS — Z8719 Personal history of other diseases of the digestive system: Secondary | ICD-10-CM | POA: Insufficient documentation

## 2014-11-12 DIAGNOSIS — Y9389 Activity, other specified: Secondary | ICD-10-CM | POA: Diagnosis not present

## 2014-11-12 DIAGNOSIS — Z8673 Personal history of transient ischemic attack (TIA), and cerebral infarction without residual deficits: Secondary | ICD-10-CM | POA: Insufficient documentation

## 2014-11-12 DIAGNOSIS — I10 Essential (primary) hypertension: Secondary | ICD-10-CM | POA: Insufficient documentation

## 2014-11-12 DIAGNOSIS — F4489 Other dissociative and conversion disorders: Secondary | ICD-10-CM | POA: Diagnosis not present

## 2014-11-12 DIAGNOSIS — S5002XA Contusion of left elbow, initial encounter: Secondary | ICD-10-CM

## 2014-11-12 DIAGNOSIS — I6789 Other cerebrovascular disease: Secondary | ICD-10-CM | POA: Diagnosis not present

## 2014-11-12 NOTE — ED Provider Notes (Signed)
TIME SEEN: 12:44 PM   CHIEF COMPLAINT: Fall   LEVEL 5 CAVEAT: AMS  HPI: HPI Comments: [name] is a 81 y.o. female, with baseline dementia, brought in by ambulance, who presents to the Emergency Department with unwitnessed fall. Patient is not able to provide any history. Pt resides at [facility] and was found by staff on the floor; timeframe is unknown, fall was not witnessed. Pt is disoriented at baseline but is aware of person and place presently in the ED. patient denies any pain. She is unable to tell me what happened.  LEVEL 5 CAVEAT: AMS    PAST MEDICAL HISTORY/PAST SURGICAL HISTORY:  Past Medical History  Diagnosis Date  . Urinary retention   . Gastritis   . Esophageal ulcer   . Salmonella sepsis   . Degenerative disk disease   . Spinal stenosis   . Asthma   . COPD (chronic obstructive pulmonary disease)   . Diabetes mellitus, type 2   . Diverticulosis of colon   . GERD (gastroesophageal reflux disease)   . Hypertension   . Osteoarthritis   . Hiatal hernia   . Pancreatitis   . S/P endoscopy 2009    Dr. Oneida Alar: gastritis  . Diverticulitis   . S/P endoscopy March 2012    Dr. Gala Romney: NSAID induced ulcer  . Hypothyroidism   . MGUS (monoclonal gammopathy of unknown significance)   . Stroke     MEDICATIONS:  Prior to Admission medications   Medication Sig Start Date End Date Taking? Authorizing Provider  aspirin 325 MG tablet Take 1 tablet (325 mg total) by mouth daily. 02/08/14   [name], DO  Calcium Citrate-Vitamin D 200-250 MG-UNIT TABS Take 1 tablet by mouth daily.    Historical Provider, MD  cephALEXin (KEFLEX) 500 MG capsule Take 1 capsule (500 mg total) by mouth 4 (four) times daily. 10/27/14   Milton Ferguson, MD  feeding supplement, ENSURE COMPLETE, (ENSURE COMPLETE) LIQD Take 237 mLs by mouth 4 (four) times daily -  with meals and at bedtime. Patient not taking: Reported on 10/27/2014 02/08/14   Velta Addison Mikhail, DO  ferrous sulfate 325 (65 FE)  MG tablet Take 325 mg by mouth daily with breakfast.    Historical Provider, MD  HYDROcodone-acetaminophen (NORCO/VICODIN) 5-325 MG per tablet Take 1 tablet by mouth every 6 (six) hours as needed for moderate pain. 02/08/14   [name], DO  levETIRAcetam (KEPPRA) 250 MG tablet Take 1 tablet (250 mg total) by mouth daily. Patient taking differently: Take 250 mg by mouth at bedtime.  02/09/14   [name], DO  levothyroxine (SYNTHROID, LEVOTHROID) 25 MCG tablet Take 25 mcg by mouth daily before breakfast.    Historical Provider, MD  montelukast (SINGULAIR) 10 MG tablet Take 10 mg by mouth every morning.     Historical Provider, MD  Multiple Vitamin (DAILY VITAMIN PO) Take 1 tablet by mouth daily.    Historical Provider, MD  polyethylene glycol (MIRALAX / GLYCOLAX) packet Take 17 g by mouth daily as needed for mild constipation.    Historical Provider, MD  solifenacin (VESICARE) 5 MG tablet Take 5 mg by mouth daily.    Historical Provider, MD  tamsulosin (FLOMAX) 0.4 MG CAPS capsule Take 1 capsule (0.4 mg total) by mouth daily. 02/08/14   [name], DO  theophylline (UNIPHYL) 400 MG 24 hr tablet Take 400 mg by mouth daily.    Historical Provider, MD    ALLERGIES:  No Known Allergies  SOCIAL HISTORY:  History  Substance Use Topics  . Smoking status: Never Smoker   . Smokeless tobacco: Never Used  . Alcohol Use: No    FAMILY HISTORY: Family History  Problem Relation Age of Onset  . Diabetes Mother     EXAM: BP 122/79 mmHg  Pulse 98  Temp(Src) 98.8 F (37.1 C) (Oral)  Resp 16  SpO2 100% CONSTITUTIONAL: Alert and oriented to person and place only but not time and adamantly response to some questions appropriately, elderly, in no distress, pleasant, smiling  HEAD: Normocephalic;s mall hematoma and abrasion to left forehead EYES: Conjunctivae clear, PERRL ENT: normal nose; no rhinorrhea; moist mucous membranes; pharynx without lesions noted, no septal hematoma or dental  injury NECK: Supple, no meningismus, no LAD, no midline spinal tenderness or step-off or deformity  CARD: RRR; S1 and S2 appreciated; no murmurs, no clicks, no rubs, no gallops, chest wall nontender palpation without crepitus or ecchymosis or deformity RESP: Normal chest excursion without splinting or tachypnea; breath sounds clear and equal bilaterally; no wheezes, no rhonchi, no rales, no hypoxia or respiratory distress, speaking full sentences ABD/GI: Normal bowel sounds; non-distended; soft, non-tender, no rebound, no guarding, no peritoneal signs BACK:  The back appears normal and is non-tender to palpation, there is no CVA tenderness but no midline spinal tenderness or step-off or deformity EXT: Normal ROM in all joints; non-tender to palpation; no edema; normal capillary refill; no cyanosis, no calf tenderness or swelling; hematoma to left lateral elbow and area of ecchymosis to the radial and dorsal aspect of left wrist; no bony deformity     SKIN: Normal color for age and race; warm NEURO: Moves all extremities equally, sensation to light touch intact diffusely, cranial nerves II through XII intact PSYCH: The patient's mood and manner are appropriate. Grooming and personal hygiene are appropriate.  MEDICAL DECISION MAKING: Patient here with unwitnessed fall. CT imaging of her head, cervical spine, x-rays of her left wrist and elbow show no acute injury. She is at her neurologic baseline and hemodynamically stable. I feel she is safe to be discharged back to her nursing facility.    I personally performed the services described in this documentation, which was scribed in my presence. The recorded information has been reviewed and is accurate.   [place], DO 11/12/14 1533

## 2014-11-12 NOTE — ED Notes (Signed)
Wynonia Hazard, caregiver from [facility] here to pick up patient. Report given to her. All questions answered.

## 2014-11-12 NOTE — ED Notes (Signed)
Per EMS, pt resident at [facility] was found in floor, unknown how long or details. Patient is AMS at baseline per EMS, per [facility]

## 2014-11-12 NOTE — Discharge Instructions (Signed)
Contusion °A contusion is a deep bruise. Contusions are the result of an injury that caused bleeding under the skin. The contusion may turn blue, purple, or yellow. Minor injuries will give you a painless contusion, but more severe contusions may stay painful and swollen for a few weeks.  °CAUSES  °A contusion is usually caused by a blow, trauma, or direct force to an area of the body. °SYMPTOMS  °· Swelling and redness of the injured area. °· Bruising of the injured area. °· Tenderness and soreness of the injured area. °· Pain. °DIAGNOSIS  °The diagnosis can be made by taking a history and physical exam. An X-ray, CT scan, or MRI may be needed to determine if there were any associated injuries, such as fractures. °TREATMENT  °Specific treatment will depend on what area of the body was injured. In general, the best treatment for a contusion is resting, icing, elevating, and applying cold compresses to the injured area. Over-the-counter medicines may also be recommended for pain control. Ask your caregiver what the best treatment is for your contusion. °HOME CARE INSTRUCTIONS  °· Put ice on the injured area. °· Put ice in a plastic bag. °· Place a towel between your skin and the bag. °· Leave the ice on for 15-20 minutes, 3-4 times a day, or as directed by your health care provider. °· Only take over-the-counter or prescription medicines for pain, discomfort, or fever as directed by your caregiver. Your caregiver may recommend avoiding anti-inflammatory medicines (aspirin, ibuprofen, and naproxen) for 48 hours because these medicines may increase bruising. °· Rest the injured area. °· If possible, elevate the injured area to reduce swelling. °SEEK IMMEDIATE MEDICAL CARE IF:  °· You have increased bruising or swelling. °· You have pain that is getting worse. °· Your swelling or pain is not relieved with medicines. °MAKE SURE YOU:  °· Understand these instructions. °· Will watch your condition. °· Will get help right  away if you are not doing well or get worse. °Document Released: 02/12/2005 Document Revised: 05/10/2013 Document Reviewed: 03/10/2011 °ExitCare® Patient Information ©2015 [employer]. This information is not intended to replace advice given to you by your health care provider. Make sure you discuss any questions you have with your health care provider. ° ° °Head Injury °You have received a head injury. It does not appear serious at this time. Headaches and vomiting are common following head injury. It should be easy to awaken from sleeping. Sometimes it is necessary for you to stay in the emergency department for a while for observation. Sometimes admission to the hospital may be needed. After injuries such as yours, most problems occur within the first 24 hours, but side effects may occur up to 7-10 days after the injury. It is important for you to carefully monitor your condition and contact your health care provider or seek immediate medical care if there is a change in your condition. °WHAT ARE THE TYPES OF HEAD INJURIES? °Head injuries can be as minor as a bump. Some head injuries can be more severe. More severe head injuries include: °· A jarring injury to the brain (concussion). °· A bruise of the brain (contusion). This mean there is bleeding in the brain that can cause swelling. °· A cracked skull (skull fracture). °· Bleeding in the brain that collects, clots, and forms a bump (hematoma). °WHAT CAUSES A HEAD INJURY? °A serious head injury is most likely to happen to someone who is in a car wreck and is not   wearing a seat belt. Other causes of major head injuries include bicycle or motorcycle accidents, sports injuries, and falls. °HOW ARE HEAD INJURIES DIAGNOSED? °A complete history of the event leading to the injury and your current symptoms will be helpful in diagnosing head injuries. Many times, pictures of the brain, such as CT or MRI are needed to see the extent of the injury. Often, an overnight  hospital stay is necessary for observation.  °WHEN SHOULD I SEEK IMMEDIATE MEDICAL CARE?  °You should get help right away if: °· You have confusion or drowsiness. °· You feel sick to your stomach (nauseous) or have continued, forceful vomiting. °· You have dizziness or unsteadiness that is getting worse. °· You have severe, continued headaches not relieved by medicine. Only take over-the-counter or prescription medicines for pain, fever, or discomfort as directed by your health care provider. °· You do not have normal function of the arms or legs or are unable to walk. °· You notice changes in the black spots in the center of the colored part of your eye (pupil). °· You have a clear or bloody fluid coming from your nose or ears. °· You have a loss of vision. °During the next 24 hours after the injury, you must stay with someone who can watch you for the warning signs. This person should contact local emergency services (911 in the [place] if you have seizures, you become unconscious, or you are unable to wake up. °HOW CAN I PREVENT A HEAD INJURY IN THE FUTURE? °The most important factor for preventing major head injuries is avoiding motor vehicle accidents.  To minimize the potential for damage to your head, it is crucial to wear seat belts while riding in motor vehicles. Wearing helmets while bike riding and playing collision sports (like football) is also helpful. Also, avoiding dangerous activities around the house will further help reduce your risk of head injury.  °WHEN CAN I RETURN TO NORMAL ACTIVITIES AND ATHLETICS? °You should be reevaluated by your health care provider before returning to these activities. If you have any of the following symptoms, you should not return to activities or contact sports until 1 week after the symptoms have stopped: °· Persistent headache. °· Dizziness or vertigo. °· Poor attention and concentration. °· Confusion. °· Memory problems. °· Nausea or vomiting. °· Fatigue or tire  easily. °· Irritability. °· Intolerant of bright lights or loud noises. °· Anxiety or depression. °· Disturbed sleep. °MAKE SURE YOU:  °· Understand these instructions. °· Will watch your condition. °· Will get help right away if you are not doing well or get worse. °Document Released: 05/05/2005 Document Revised: 05/10/2013 Document Reviewed: 01/10/2013 °ExitCare® Patient Information ©2015 [employer]. This information is not intended to replace advice given to you by your health care provider. Make sure you discuss any questions you have with your health care provider. ° °

## 2014-11-12 NOTE — ED Notes (Signed)
Patient disoriented times 4, which is her baseline, speaks clearly. Patient has a raised red knot on left forehead at hairline about the size of a quarter. When asked if she hit her head, patient states " i don't know".

## 2014-11-12 NOTE — ED Notes (Signed)
Spoke with [employer], someone in route to pick up patient.

## 2014-11-16 ENCOUNTER — Emergency Department (HOSPITAL_COMMUNITY)
Admission: EM | Admit: 2014-11-16 | Discharge: 2014-11-16 | Disposition: A | Discharge status: Other Acute Inpatient Hospital | Payer: Medicare Other | Attending: Emergency Medicine | Admitting: Emergency Medicine

## 2014-11-16 ENCOUNTER — Emergency Department (HOSPITAL_COMMUNITY): Payer: Medicare Other

## 2014-11-16 ENCOUNTER — Encounter (HOSPITAL_COMMUNITY): Payer: Self-pay | Admitting: Emergency Medicine

## 2014-11-16 DIAGNOSIS — Z7982 Long term (current) use of aspirin: Secondary | ICD-10-CM | POA: Diagnosis not present

## 2014-11-16 DIAGNOSIS — E039 Hypothyroidism, unspecified: Secondary | ICD-10-CM | POA: Insufficient documentation

## 2014-11-16 DIAGNOSIS — R609 Edema, unspecified: Secondary | ICD-10-CM | POA: Diagnosis not present

## 2014-11-16 DIAGNOSIS — I10 Essential (primary) hypertension: Secondary | ICD-10-CM | POA: Diagnosis not present

## 2014-11-16 DIAGNOSIS — Z8673 Personal history of transient ischemic attack (TIA), and cerebral infarction without residual deficits: Secondary | ICD-10-CM | POA: Insufficient documentation

## 2014-11-16 DIAGNOSIS — J449 Chronic obstructive pulmonary disease, unspecified: Secondary | ICD-10-CM | POA: Diagnosis not present

## 2014-11-16 DIAGNOSIS — F0390 Unspecified dementia without behavioral disturbance: Secondary | ICD-10-CM | POA: Insufficient documentation

## 2014-11-16 DIAGNOSIS — Z79899 Other long term (current) drug therapy: Secondary | ICD-10-CM | POA: Diagnosis not present

## 2014-11-16 DIAGNOSIS — Z792 Long term (current) use of antibiotics: Secondary | ICD-10-CM | POA: Diagnosis not present

## 2014-11-16 DIAGNOSIS — R140 Abdominal distension (gaseous): Secondary | ICD-10-CM | POA: Diagnosis not present

## 2014-11-16 DIAGNOSIS — E119 Type 2 diabetes mellitus without complications: Secondary | ICD-10-CM | POA: Diagnosis not present

## 2014-11-16 DIAGNOSIS — M7989 Other specified soft tissue disorders: Secondary | ICD-10-CM | POA: Diagnosis not present

## 2014-11-16 DIAGNOSIS — R918 Other nonspecific abnormal finding of lung field: Secondary | ICD-10-CM | POA: Diagnosis not present

## 2014-11-16 DIAGNOSIS — M1990 Unspecified osteoarthritis, unspecified site: Secondary | ICD-10-CM | POA: Insufficient documentation

## 2014-11-16 LAB — CBC WITH DIFFERENTIAL/PLATELET
Basophils Absolute: 0 10*3/uL (ref 0.0–0.1)
Basophils Relative: 0 % (ref 0–1)
EOS PCT: 4 % (ref 0–5)
Eosinophils Absolute: 0.3 10*3/uL (ref 0.0–0.7)
HCT: 33.6 % — ABNORMAL LOW (ref 36.0–46.0)
HEMOGLOBIN: 11 g/dL — AB (ref 12.0–15.0)
Lymphocytes Relative: 22 % (ref 12–46)
Lymphs Abs: 1.6 10*3/uL (ref 0.7–4.0)
MCH: 32.6 pg (ref 26.0–34.0)
MCHC: 32.7 g/dL (ref 30.0–36.0)
MCV: 99.7 fL (ref 78.0–100.0)
Monocytes Absolute: 0.8 10*3/uL (ref 0.1–1.0)
Monocytes Relative: 11 % (ref 3–12)
NEUTROS ABS: 4.5 10*3/uL (ref 1.7–7.7)
NEUTROS PCT: 63 % (ref 43–77)
Platelets: 160 10*3/uL (ref 150–400)
RBC: 3.37 MIL/uL — AB (ref 3.87–5.11)
RDW: 14.2 % (ref 11.5–15.5)
WBC: 7.2 10*3/uL (ref 4.0–10.5)

## 2014-11-16 LAB — BASIC METABOLIC PANEL
ANION GAP: 7 (ref 5–15)
BUN: 27 mg/dL — AB (ref 6–20)
CALCIUM: 8.7 mg/dL — AB (ref 8.9–10.3)
CHLORIDE: 106 mmol/L (ref 101–111)
CO2: 29 mmol/L (ref 22–32)
Creatinine, Ser: 1.2 mg/dL — ABNORMAL HIGH (ref 0.44–1.00)
GFR calc Af Amer: 42 mL/min — ABNORMAL LOW (ref >60)
GFR calc non Af Amer: 36 mL/min — ABNORMAL LOW (ref >60)
GLUCOSE: 121 mg/dL — AB (ref 65–99)
POTASSIUM: 4.2 mmol/L (ref 3.5–5.1)
Sodium: 142 mmol/L (ref 135–145)

## 2014-11-16 NOTE — ED Provider Notes (Signed)
CSN: [id number]     Arrival date & time 11/16/14  1034 History  This chart was scribed for [name] Sorrow, MD by Rayna Sexton, ED scribe. This patient was seen in room APA11/APA11 and the patient's care was started at 11:33 AM.    Chief Complaint  Patient presents with  . Leg Swelling    LEVEL 5 CAVEAT: AMS   The history is provided by the patient. The history is limited by the condition of the patient. No language interpreter was used.    HPI Comments: [name] is a 81 y.o. female, with baseline dementia, who presents to the Emergency Department due to bilateral lower leg swelling. Pt is unable to provide any history. Pt currently stays at [facility] and the staff noticed her swelling which prompted them to bring her to the emergency department.  Past Medical History  Diagnosis Date  . Urinary retention   . Gastritis   . Esophageal ulcer   . Salmonella sepsis   . Degenerative disk disease   . Spinal stenosis   . Asthma   . COPD (chronic obstructive pulmonary disease)   . Diabetes mellitus, type 2   . Diverticulosis of colon   . GERD (gastroesophageal reflux disease)   . Hypertension   . Osteoarthritis   . Hiatal hernia   . Pancreatitis   . S/P endoscopy 2009    Dr. Oneida Alar: gastritis  . Diverticulitis   . S/P endoscopy March 2012    Dr. Gala Romney: NSAID induced ulcer  . Hypothyroidism   . MGUS (monoclonal gammopathy of unknown significance)   . Stroke    Past Surgical History  Procedure Laterality Date  . Cervical fusion    . Cholecystectomy    . Ercp N/A 02/02/2014    Procedure: ENDOSCOPIC RETROGRADE CHOLANGIOPANCREATOGRAPHY (ERCP), STONE BASKET EXTRACTION;  Surgeon: Daneil Dolin, MD;  Location: [facility];  Service: Endoscopy;  Laterality: N/A;  . Sphincterotomy N/A 02/02/2014    Procedure: SPHINCTEROTOMY;  Surgeon: Daneil Dolin, MD;  Location: [facility];  Service: Endoscopy;  Laterality: N/A;  . Balloon dilation N/A 02/02/2014     Procedure: BALLOON DILATION;  Surgeon: Daneil Dolin, MD;  Location: [facility];  Service: Endoscopy;  Laterality: N/A;  . Biliary stent placement N/A 02/02/2014    Procedure: BILIARY STENT PLACEMENT;  Surgeon: Daneil Dolin, MD;  Location: [facility];  Service: Endoscopy;  Laterality: N/A;  . Spyglass cholangioscopy N/A 02/02/2014    Procedure: HDQQIWLN CHOLANGIOSCOPY;  Surgeon: Daneil Dolin, MD;  Location: [facility];  Service: Endoscopy;  Laterality: N/A;  . Esophagogastroduodenoscopy N/A 02/05/2014    Procedure: ESOPHAGOGASTRODUODENOSCOPY (EGD);  Surgeon: [name], MD;  Location: [facility];  Service: Endoscopy;  Laterality: N/A;   Family History  Problem Relation Age of Onset  . Diabetes Mother    History  Substance Use Topics  . Smoking status: Never Smoker   . Smokeless tobacco: Never Used  . Alcohol Use: No   OB History    No data available     Review of Systems  Unable to perform ROS: Dementia      Allergies  Review of patient's allergies indicates no known allergies.  Home Medications   Prior to Admission medications   Medication Sig Start Date End Date Taking? Authorizing Provider  aspirin 325 MG tablet Take 1 tablet (325 mg total) by mouth daily. 02/08/14  Yes [name], DO  Calcium Citrate-Vitamin D 200-250 MG-UNIT TABS Take 1  tablet by mouth daily.   Yes Historical Provider, MD  carbamide peroxide (EARWAX TREATMENT DROPS) 6.5 % otic solution Place 5 drops into both ears 2 (two) times daily as needed (build up).   Yes Historical Provider, MD  ciprofloxacin (CIPRO) 500 MG tablet Take 500 mg by mouth 2 (two) times daily. For 7 days start 11/10/14   Yes Historical Provider, MD  ferrous sulfate 325 (65 FE) MG tablet Take 325 mg by mouth daily with breakfast.   Yes Historical Provider, MD  griseofulvin (GRIS-PEG) 250 MG tablet Take 375 mg by mouth daily. 11/08/14  Yes Historical Provider, MD  HYDROcodone-acetaminophen (NORCO/VICODIN) 5-325 MG per tablet Take 1 tablet  by mouth every 6 (six) hours as needed for moderate pain. 02/08/14  Yes [name], DO  levETIRAcetam (KEPPRA) 250 MG tablet Take 1 tablet (250 mg total) by mouth daily. Patient taking differently: Take 250 mg by mouth at bedtime.  02/09/14  Yes [name], DO  levothyroxine (SYNTHROID, LEVOTHROID) 25 MCG tablet Take 25 mcg by mouth daily before breakfast.   Yes Historical Provider, MD  montelukast (SINGULAIR) 10 MG tablet Take 10 mg by mouth every morning.    Yes Historical Provider, MD  Multiple Vitamin (DAILY VITAMIN PO) Take 1 tablet by mouth daily.   Yes Historical Provider, MD  polyethylene glycol (MIRALAX / GLYCOLAX) packet Take 17 g by mouth daily as needed for mild constipation.   Yes Historical Provider, MD  solifenacin (VESICARE) 5 MG tablet Take 5 mg by mouth daily.   Yes Historical Provider, MD  tamsulosin (FLOMAX) 0.4 MG CAPS capsule Take 1 capsule (0.4 mg total) by mouth daily. 02/08/14  Yes [name], DO  theophylline (UNIPHYL) 400 MG 24 hr tablet Take 400 mg by mouth daily.   Yes Historical Provider, MD  cephALEXin (KEFLEX) 500 MG capsule Take 1 capsule (500 mg total) by mouth 4 (four) times daily. Patient not taking: Reported on 11/12/2014 10/27/14   Milton Ferguson, MD   BP 129/63 mmHg  Pulse 90  Temp(Src) 98.2 F (36.8 C) (Oral)  Resp 18  Ht 5' (1.524 m)  Wt 110 lb (49.896 kg)  BMI 21.48 kg/m2  SpO2 94% Physical Exam  Constitutional: She is oriented to person, place, and time. She appears well-developed and well-nourished. No distress.  HENT:  Head: Normocephalic and atraumatic.  Mouth/Throat: Oropharynx is clear and moist.  Eyes: Conjunctivae and EOM are normal. Pupils are equal, round, and reactive to light. No scleral icterus.  Neck: Normal range of motion. Neck supple. No tracheal deviation present.  Cardiovascular: Normal rate, regular rhythm and normal heart sounds.  Exam reveals no gallop and no friction rub.   No murmur heard. Pulmonary/Chest:  Breath sounds normal. No respiratory distress. She has no wheezes. She has no rales.  91-100% oxygen saturation on RA  Abdominal: Soft. Bowel sounds are normal. She exhibits distension. There is no tenderness.  Musculoskeletal: She exhibits edema.  2 sec capillary refill; increased warmth of left foot around big toe and 2nd toe; bilateral pitting edema;   Neurological: She is alert and oriented to person, place, and time. No cranial nerve deficit. She exhibits normal muscle tone. Coordination normal.  Pt able to ambulate hands bilaterally   Skin: Skin is warm and dry.  Psychiatric: She has a normal mood and affect. Her behavior is normal.  Nursing note and vitals reviewed.   ED Course  Procedures  DIAGNOSTIC STUDIES: Oxygen Saturation is 94% on adequate, RA by my interpretation.  COORDINATION OF CARE: 11:36 AM Discussed treatment plan with pt at bedside and pt agreed to plan.  Labs Review Labs Reviewed  BASIC METABOLIC PANEL - Abnormal; Notable for the following:    Glucose, Bld 121 (*)    BUN 27 (*)    Creatinine, Ser 1.20 (*)    Calcium 8.7 (*)    GFR calc non Af Amer 36 (*)    GFR calc Af Amer 42 (*)    All other components within normal limits  CBC WITH DIFFERENTIAL/PLATELET - Abnormal; Notable for the following:    RBC 3.37 (*)    Hemoglobin 11.0 (*)    HCT 33.6 (*)    All other components within normal limits   Results for orders placed or performed during the hospital encounter of 15/40/08  Basic metabolic panel  Result Value Ref Range   Sodium 142 135 - 145 mmol/L   Potassium 4.2 3.5 - 5.1 mmol/L   Chloride 106 101 - 111 mmol/L   CO2 29 22 - 32 mmol/L   Glucose, Bld 121 (H) 65 - 99 mg/dL   BUN 27 (H) 6 - 20 mg/dL   Creatinine, Ser 1.20 (H) 0.44 - 1.00 mg/dL   Calcium 8.7 (L) 8.9 - 10.3 mg/dL   GFR calc non Af Amer 36 (L) >60 mL/min   GFR calc Af Amer 42 (L) >60 mL/min   Anion gap 7 5 - 15  CBC with Differential/Platelet  Result Value Ref Range   WBC 7.2  4.0 - 10.5 K/uL   RBC 3.37 (L) 3.87 - 5.11 MIL/uL   Hemoglobin 11.0 (L) 12.0 - 15.0 g/dL   HCT 33.6 (L) 36.0 - 46.0 %   MCV 99.7 78.0 - 100.0 fL   MCH 32.6 26.0 - 34.0 pg   MCHC 32.7 30.0 - 36.0 g/dL   RDW 14.2 11.5 - 15.5 %   Platelets 160 150 - 400 K/uL   Neutrophils Relative % 63 43 - 77 %   Neutro Abs 4.5 1.7 - 7.7 K/uL   Lymphocytes Relative 22 12 - 46 %   Lymphs Abs 1.6 0.7 - 4.0 K/uL   Monocytes Relative 11 3 - 12 %   Monocytes Absolute 0.8 0.1 - 1.0 K/uL   Eosinophils Relative 4 0 - 5 %   Eosinophils Absolute 0.3 0.0 - 0.7 K/uL   Basophils Relative 0 0 - 1 %   Basophils Absolute 0.0 0.0 - 0.1 K/uL     Imaging Review US Venous Img Lower Bilateral  11/16/2014   CLINICAL DATA:  BILATERAL lower extremity swelling.  EXAM: BILATERAL LOWER EXTREMITY VENOUS DOPPLER ULTRASOUND  TECHNIQUE: Gray-scale sonography with graded compression, as well as color Doppler and duplex ultrasound were performed to evaluate the lower extremity deep venous systems from the level of the common femoral vein and including the common femoral, femoral, profunda femoral, popliteal and calf veins including the posterior tibial, peroneal and gastrocnemius veins when visible. The superficial great saphenous vein was also interrogated. Spectral Doppler was utilized to evaluate flow at rest and with distal augmentation maneuvers in the common femoral, femoral and popliteal veins.  COMPARISON:  None.  FINDINGS: RIGHT LOWER EXTREMITY  Common Femoral Vein: No evidence of thrombus. Normal compressibility, respiratory phasicity and response to augmentation.  Saphenofemoral Junction: No evidence of thrombus. Normal compressibility and flow on color Doppler imaging.  Profunda Femoral Vein: No evidence of thrombus. Normal compressibility and flow on color Doppler imaging.  Femoral Vein: No evidence of thrombus. Normal  compressibility, respiratory phasicity and response to augmentation.  Popliteal Vein: No evidence of thrombus.  Normal compressibility, respiratory phasicity and response to augmentation.  Calf Veins: No evidence of thrombus. Normal compressibility and flow on color Doppler imaging.  Superficial Great Saphenous Vein: No evidence of thrombus. Normal compressibility and flow on color Doppler imaging.  Venous Reflux:  None.  Other Findings:  Moderate soft tissue  edema.  LEFT LOWER EXTREMITY  Common Femoral Vein: No evidence of thrombus. Normal compressibility, respiratory phasicity and response to augmentation.  Saphenofemoral Junction: No evidence of thrombus. Normal compressibility and flow on color Doppler imaging.  Profunda Femoral Vein: No evidence of thrombus. Normal compressibility and flow on color Doppler imaging.  Femoral Vein: No evidence of thrombus. Normal compressibility, respiratory phasicity and response to augmentation.  Popliteal Vein: No evidence of thrombus. Normal compressibility, respiratory phasicity and response to augmentation.  Calf Veins: No evidence of thrombus. Normal compressibility and flow on color Doppler imaging.  Superficial Great Saphenous Vein: No evidence of thrombus. Normal compressibility and flow on color Doppler imaging.  Venous Reflux:  None.  Other Findings:  Moderate soft tissue edema.  IMPRESSION: No evidence of RIGHT or LEFT deep venous thrombosis.   Electronically Signed   By: Staci Righter M.D.   On: 11/16/2014 14:13   Dg Chest Port 1 View  11/16/2014   CLINICAL DATA:  81 year old female with a history of leg swelling.  EXAM: PORTABLE CHEST - 1 VIEW  COMPARISON:  07/25/2014, 02/13/2014, 02/06/2014  FINDINGS: Cardiomediastinal silhouette unchanged in size and contour. No evidence of pulmonary vascular congestion.  Chronic interstitial opacities.  No confluent airspace disease. Prominence of the minor fissure again noted.  No pneumothorax or pleural effusion. Aeration on the left is improved from the prior.  No displaced fracture.  Degenerative changes of the shoulders.  Re-  demonstration of surgical changes of the cervical region.  IMPRESSION: No radiographic evidence of acute cardiopulmonary disease with a background of chronic changes. Improved aeration compared to the prior.  Signed,  Dulcy Fanny. Earleen Newport, DO  [facility]  [facility]   Electronically Signed   By: Corrie Mckusick D.O.   On: 11/16/2014 12:17     EKG Interpretation None      MDM   Final diagnoses:  Leg swelling    Workup for the leg swelling without any significant maladies. Doppler studies are negative. There is some mild renal insufficiency chest x-rays negative no signs of any fluid on the lungs or pulmonary edema.  I would recommend repeat the kidney function in one week by primary care provider.  I personally performed the services described in this documentation, which was scribed in my presence. The recorded information has been reviewed and is accurate.     [name] Sorrow, MD 11/16/14 [phone]

## 2014-11-16 NOTE — Discharge Instructions (Signed)
Workup for the leg swelling without any significant abnormalities. There is a little bit of renal insufficiency follow-up blood work on her renal function could be done in 2 weeks. No evidence of any blood clots in the legs or deep vein thrombosis. No significant electrolyte abnormalities.

## 2014-11-16 NOTE — ED Notes (Signed)
Report given to Butch Penny at [facility], per her transport is on the way

## 2014-11-16 NOTE — ED Notes (Signed)
From [facility].  Caretaker says they notice swelling to BLE.

## 2014-11-16 NOTE — ED Notes (Signed)
Caregiver from [facility] here to pick up patient.

## 2014-11-21 DIAGNOSIS — L03116 Cellulitis of left lower limb: Secondary | ICD-10-CM | POA: Diagnosis not present

## 2014-11-21 DIAGNOSIS — Z1389 Encounter for screening for other disorder: Secondary | ICD-10-CM | POA: Diagnosis not present

## 2014-11-21 DIAGNOSIS — R600 Localized edema: Secondary | ICD-10-CM | POA: Diagnosis not present

## 2014-11-21 DIAGNOSIS — Z681 Body mass index (BMI) 19 or less, adult: Secondary | ICD-10-CM | POA: Diagnosis not present

## 2014-12-06 DIAGNOSIS — I69951 Hemiplegia and hemiparesis following unspecified cerebrovascular disease affecting right dominant side: Secondary | ICD-10-CM | POA: Diagnosis not present

## 2014-12-07 DIAGNOSIS — I69951 Hemiplegia and hemiparesis following unspecified cerebrovascular disease affecting right dominant side: Secondary | ICD-10-CM | POA: Diagnosis not present

## 2014-12-07 DIAGNOSIS — K8032 Calculus of bile duct with acute cholangitis without obstruction: Secondary | ICD-10-CM | POA: Diagnosis not present

## 2014-12-07 DIAGNOSIS — I10 Essential (primary) hypertension: Secondary | ICD-10-CM | POA: Diagnosis not present

## 2014-12-07 DIAGNOSIS — R339 Retention of urine, unspecified: Secondary | ICD-10-CM | POA: Diagnosis not present

## 2014-12-29 DIAGNOSIS — I69951 Hemiplegia and hemiparesis following unspecified cerebrovascular disease affecting right dominant side: Secondary | ICD-10-CM | POA: Diagnosis not present

## 2015-01-30 DIAGNOSIS — I69951 Hemiplegia and hemiparesis following unspecified cerebrovascular disease affecting right dominant side: Secondary | ICD-10-CM | POA: Diagnosis not present

## 2015-02-21 DIAGNOSIS — I69951 Hemiplegia and hemiparesis following unspecified cerebrovascular disease affecting right dominant side: Secondary | ICD-10-CM | POA: Diagnosis not present

## 2015-03-08 DIAGNOSIS — H353131 Nonexudative age-related macular degeneration, bilateral, early dry stage: Secondary | ICD-10-CM | POA: Diagnosis not present

## 2015-03-08 DIAGNOSIS — I69951 Hemiplegia and hemiparesis following unspecified cerebrovascular disease affecting right dominant side: Secondary | ICD-10-CM | POA: Diagnosis not present

## 2015-03-09 DIAGNOSIS — Z23 Encounter for immunization: Secondary | ICD-10-CM | POA: Diagnosis not present

## 2015-04-04 DIAGNOSIS — I69951 Hemiplegia and hemiparesis following unspecified cerebrovascular disease affecting right dominant side: Secondary | ICD-10-CM | POA: Diagnosis not present

## 2015-04-14 ENCOUNTER — Emergency Department (HOSPITAL_COMMUNITY): Payer: Medicare Other

## 2015-04-14 ENCOUNTER — Encounter (HOSPITAL_COMMUNITY): Payer: Self-pay

## 2015-04-14 ENCOUNTER — Emergency Department (HOSPITAL_COMMUNITY)
Admission: EM | Admit: 2015-04-14 | Discharge: 2015-04-14 | Disposition: A | Discharge status: Home / Self Care | Payer: Medicare Other | Attending: Emergency Medicine | Admitting: Emergency Medicine

## 2015-04-14 DIAGNOSIS — W050XXA Fall from non-moving wheelchair, initial encounter: Secondary | ICD-10-CM | POA: Insufficient documentation

## 2015-04-14 DIAGNOSIS — W19XXXA Unspecified fall, initial encounter: Secondary | ICD-10-CM

## 2015-04-14 DIAGNOSIS — Y9389 Activity, other specified: Secondary | ICD-10-CM | POA: Diagnosis not present

## 2015-04-14 DIAGNOSIS — Z8619 Personal history of other infectious and parasitic diseases: Secondary | ICD-10-CM | POA: Insufficient documentation

## 2015-04-14 DIAGNOSIS — S79911A Unspecified injury of right hip, initial encounter: Secondary | ICD-10-CM | POA: Insufficient documentation

## 2015-04-14 DIAGNOSIS — M1990 Unspecified osteoarthritis, unspecified site: Secondary | ICD-10-CM | POA: Insufficient documentation

## 2015-04-14 DIAGNOSIS — Z79899 Other long term (current) drug therapy: Secondary | ICD-10-CM | POA: Insufficient documentation

## 2015-04-14 DIAGNOSIS — M25551 Pain in right hip: Secondary | ICD-10-CM

## 2015-04-14 DIAGNOSIS — T148 Other injury of unspecified body region: Secondary | ICD-10-CM | POA: Diagnosis not present

## 2015-04-14 DIAGNOSIS — Z8719 Personal history of other diseases of the digestive system: Secondary | ICD-10-CM | POA: Insufficient documentation

## 2015-04-14 DIAGNOSIS — F0390 Unspecified dementia without behavioral disturbance: Secondary | ICD-10-CM | POA: Insufficient documentation

## 2015-04-14 DIAGNOSIS — S199XXA Unspecified injury of neck, initial encounter: Secondary | ICD-10-CM | POA: Diagnosis not present

## 2015-04-14 DIAGNOSIS — Z8673 Personal history of transient ischemic attack (TIA), and cerebral infarction without residual deficits: Secondary | ICD-10-CM | POA: Insufficient documentation

## 2015-04-14 DIAGNOSIS — I10 Essential (primary) hypertension: Secondary | ICD-10-CM | POA: Insufficient documentation

## 2015-04-14 DIAGNOSIS — Z862 Personal history of diseases of the blood and blood-forming organs and certain disorders involving the immune mechanism: Secondary | ICD-10-CM | POA: Diagnosis not present

## 2015-04-14 DIAGNOSIS — S0990XA Unspecified injury of head, initial encounter: Secondary | ICD-10-CM | POA: Diagnosis not present

## 2015-04-14 DIAGNOSIS — Y998 Other external cause status: Secondary | ICD-10-CM | POA: Diagnosis not present

## 2015-04-14 DIAGNOSIS — S40012A Contusion of left shoulder, initial encounter: Secondary | ICD-10-CM | POA: Insufficient documentation

## 2015-04-14 DIAGNOSIS — M25512 Pain in left shoulder: Secondary | ICD-10-CM | POA: Diagnosis not present

## 2015-04-14 DIAGNOSIS — E039 Hypothyroidism, unspecified: Secondary | ICD-10-CM | POA: Diagnosis not present

## 2015-04-14 DIAGNOSIS — M79632 Pain in left forearm: Secondary | ICD-10-CM | POA: Diagnosis not present

## 2015-04-14 DIAGNOSIS — Y92128 Other place in nursing home as the place of occurrence of the external cause: Secondary | ICD-10-CM | POA: Insufficient documentation

## 2015-04-14 DIAGNOSIS — S4992XA Unspecified injury of left shoulder and upper arm, initial encounter: Secondary | ICD-10-CM | POA: Diagnosis not present

## 2015-04-14 DIAGNOSIS — J449 Chronic obstructive pulmonary disease, unspecified: Secondary | ICD-10-CM | POA: Diagnosis not present

## 2015-04-14 DIAGNOSIS — Y9289 Other specified places as the place of occurrence of the external cause: Secondary | ICD-10-CM | POA: Diagnosis not present

## 2015-04-14 DIAGNOSIS — Z7982 Long term (current) use of aspirin: Secondary | ICD-10-CM | POA: Diagnosis not present

## 2015-04-14 DIAGNOSIS — E119 Type 2 diabetes mellitus without complications: Secondary | ICD-10-CM | POA: Diagnosis not present

## 2015-04-14 DIAGNOSIS — Y92129 Unspecified place in nursing home as the place of occurrence of the external cause: Secondary | ICD-10-CM

## 2015-04-14 NOTE — ED Provider Notes (Signed)
CSN: [id number]     Arrival date & time 04/14/15  [id number] History   First MD Initiated Contact with Patient 04/14/15 0502     Chief Complaint  Patient presents with  . Fall     (Consider location/radiation/quality/duration/timing/severity/associated sxs/prior Treatment) Patient is a 81 y.o. female presenting with fall. The history is provided by the nursing home. The history is limited by the condition of the patient (Dementia).  Fall  She is reported to have fallen out of her wheelchair with injury to her left shoulder. She is also reported to have had a fall several days ago with injury to her right hip. Patient is not able to give any history.  Past Medical History  Diagnosis Date  . Urinary retention   . Gastritis   . Esophageal ulcer   . Salmonella sepsis ([place])   . Degenerative disk disease   . Spinal stenosis   . Asthma   . COPD (chronic obstructive pulmonary disease) ([place])   . Diabetes mellitus, type 2 ([place])   . Diverticulosis of colon   . GERD (gastroesophageal reflux disease)   . Hypertension   . Osteoarthritis   . Hiatal hernia   . Pancreatitis   . S/P endoscopy 2009    Dr. Oneida Alar: gastritis  . Diverticulitis   . S/P endoscopy March 2012    Dr. Gala Romney: NSAID induced ulcer  . Hypothyroidism   . MGUS (monoclonal gammopathy of unknown significance)   . Stroke [facility])    Past Surgical History  Procedure Laterality Date  . Cervical fusion    . Cholecystectomy    . Ercp N/A 02/02/2014    Procedure: ENDOSCOPIC RETROGRADE CHOLANGIOPANCREATOGRAPHY (ERCP), STONE BASKET EXTRACTION;  Surgeon: Daneil Dolin, MD;  Location: [facility];  Service: Endoscopy;  Laterality: N/A;  . Sphincterotomy N/A 02/02/2014    Procedure: SPHINCTEROTOMY;  Surgeon: Daneil Dolin, MD;  Location: [facility];  Service: Endoscopy;  Laterality: N/A;  . Balloon dilation N/A 02/02/2014    Procedure: BALLOON DILATION;  Surgeon: Daneil Dolin, MD;  Location: [facility];  Service: Endoscopy;  Laterality: N/A;  .  Biliary stent placement N/A 02/02/2014    Procedure: BILIARY STENT PLACEMENT;  Surgeon: Daneil Dolin, MD;  Location: [facility];  Service: Endoscopy;  Laterality: N/A;  . Spyglass cholangioscopy N/A 02/02/2014    Procedure: [id number] CHOLANGIOSCOPY;  Surgeon: Daneil Dolin, MD;  Location: [facility];  Service: Endoscopy;  Laterality: N/A;  . Esophagogastroduodenoscopy N/A 02/05/2014    Procedure: ESOPHAGOGASTRODUODENOSCOPY (EGD);  Surgeon: [name], MD;  Location: [facility];  Service: Endoscopy;  Laterality: N/A;   Family History  Problem Relation Age of Onset  . Diabetes Mother    Social History  Substance Use Topics  . Smoking status: Never Smoker   . Smokeless tobacco: Never Used  . Alcohol Use: No   OB History    No data available     Review of Systems  Unable to perform ROS: Dementia      Allergies  Review of patient's allergies indicates no known allergies.  Home Medications   Prior to Admission medications   Medication Sig Start Date End Date Taking? Authorizing Provider  aspirin 325 MG tablet Take 1 tablet (325 mg total) by mouth daily. 02/08/14   [name], DO  Calcium Citrate-Vitamin D 200-250 MG-UNIT TABS Take 1 tablet by mouth daily.    Historical Provider, MD  carbamide peroxide (EARWAX TREATMENT DROPS) 6.5 % otic solution Place 5 drops into both  ears 2 (two) times daily as needed (build up).    Historical Provider, MD  cephALEXin (KEFLEX) 500 MG capsule Take 1 capsule (500 mg total) by mouth 4 (four) times daily. Patient not taking: Reported on 11/12/2014 10/27/14   Milton Ferguson, MD  ciprofloxacin (CIPRO) 500 MG tablet Take 500 mg by mouth 2 (two) times daily. For 7 days start 11/10/14    Historical Provider, MD  ferrous sulfate 325 (65 FE) MG tablet Take 325 mg by mouth daily with breakfast.    Historical Provider, MD  griseofulvin (GRIS-PEG) 250 MG tablet Take 375 mg by mouth daily. 11/08/14   Historical Provider, MD  HYDROcodone-acetaminophen  (NORCO/VICODIN) 5-325 MG per tablet Take 1 tablet by mouth every 6 (six) hours as needed for moderate pain. 02/08/14   [name], DO  levETIRAcetam (KEPPRA) 250 MG tablet Take 1 tablet (250 mg total) by mouth daily. Patient taking differently: Take 250 mg by mouth at bedtime.  02/09/14   [name], DO  levothyroxine (SYNTHROID, LEVOTHROID) 25 MCG tablet Take 25 mcg by mouth daily before breakfast.    Historical Provider, MD  montelukast (SINGULAIR) 10 MG tablet Take 10 mg by mouth every morning.     Historical Provider, MD  Multiple Vitamin (DAILY VITAMIN PO) Take 1 tablet by mouth daily.    Historical Provider, MD  polyethylene glycol (MIRALAX / GLYCOLAX) packet Take 17 g by mouth daily as needed for mild constipation.    Historical Provider, MD  solifenacin (VESICARE) 5 MG tablet Take 5 mg by mouth daily.    Historical Provider, MD  tamsulosin (FLOMAX) 0.4 MG CAPS capsule Take 1 capsule (0.4 mg total) by mouth daily. 02/08/14   [name], DO  theophylline (UNIPHYL) 400 MG 24 hr tablet Take 400 mg by mouth daily.    Historical Provider, MD   BP 155/68 mmHg  Pulse 100  Temp(Src) 99.7 F (37.6 C) (Oral)  Resp 20  Ht 5\' 3"  (1.6 m)  Wt 110 lb (49.896 kg)  BMI 19.49 kg/m2  SpO2 95% Physical Exam  Nursing note and vitals reviewed.  81 year old female, resting comfortably and in no acute distress. Vital signs are significant for hypertension. Oxygen saturation is 95%, which is normal. Head is normocephalic and atraumatic. PERRLA, EOMI. Oropharynx is clear. Neck is nontender without adenopathy or JVD. Stiff cervical collar is in place. Back is nontender and there is no CVA tenderness. Lungs are clear without rales, wheezes, or rhonchi. Chest is nontender. Heart has regular rate and rhythm without murmur. Abdomen is soft, flat, nontender without masses or hepatosplenomegaly and peristalsis is normoactive. Extremities have no cyanosis or edema. Soft tissue swelling is present  around the left shoulder with tenderness to palpation and pain on passive range of motion. Remainder of left upper extremity is normal to examination. There is pain on passive range of motion of the right hip. Skin is warm and dry without rash. Neurologic: She is awake but nonverbal and does not follow commands, cranial nerves are intact, there are no gross motor or sensory deficits.  ED Course  Procedures (including critical care time)  Imaging Review Dg Forearm Left  04/14/2015  CLINICAL DATA:  Status post fall out of wheelchair, with left forearm pain. Initial encounter. EXAM: LEFT FOREARM - 2 VIEW COMPARISON:  Left elbow radiographs performed 11/12/2014 FINDINGS: There is no evidence of fracture or dislocation. The radius and ulna appear intact. The elbow joint is grossly unremarkable. No elbow joint effusion is identified. The  carpal rows appear grossly intact, aside from mild degenerative change at the radial aspect of the carpal rows. There is calcification of the triangular fibrocartilage. IMPRESSION: No evidence of acute fracture or dislocation. Electronically Signed   By: Garald Balding M.D.   On: 04/14/2015 06:35   Ct Head Wo Contrast  04/14/2015  CLINICAL DATA:  Status post fall forward out of wheelchair, hitting forehead on floor. Concern for head or cervical spine injury. Initial encounter. EXAM: CT HEAD WITHOUT CONTRAST CT CERVICAL SPINE WITHOUT CONTRAST TECHNIQUE: Multidetector CT imaging of the head and cervical spine was performed following the standard protocol without intravenous contrast. Multiplanar CT image reconstructions of the cervical spine were also generated. COMPARISON:  CT of the head and cervical spine performed 11/12/2014 FINDINGS: CT HEAD FINDINGS There is no evidence of acute infarction, mass lesion, or intra- or extra-axial hemorrhage on CT. Prominence of the ventricles and sulci reflects mild cortical volume loss. Scattered periventricular white matter change likely  reflects small vessel ischemic microangiopathy. Mild chronic ischemic change is noted at the external capsule bilaterally. Mild cerebellar atrophy is noted. The brainstem and fourth ventricle are within normal limits. The cerebral hemispheres demonstrate grossly normal gray-white differentiation. No mass effect or midline shift is seen. There is no evidence of fracture; visualized osseous structures are unremarkable in appearance. The orbits are within normal limits. The paranasal sinuses and mastoid air cells are well-aerated. No significant soft tissue abnormalities are seen. CT CERVICAL SPINE FINDINGS There is no evidence of fracture or subluxation. Diffuse degenerative change is noted about the dens. The patient is status post anterior cervical spinal fusion at C4-C7, with associated degenerative change. Underlying facet disease is noted. Vertebral bodies demonstrate normal height and alignment. Prevertebral soft tissues are within normal limits. The thyroid gland is unremarkable in appearance. Minimal scarring is noted at the lung apices. No significant soft tissue abnormalities are seen. IMPRESSION: 1. No evidence of traumatic intracranial injury or fracture. 2. No evidence of fracture or subluxation along the cervical spine. 3. Mild cortical volume loss and scattered small vessel ischemic microangiopathy. Mild chronic ischemic change at the external capsule bilaterally. 4. Degenerative change along the cervical spine, status post anterior cervical spinal fusion at C4-C7. 5. Minimal scarring at the lung apices. Electronically Signed   By: Garald Balding M.D.   On: 04/14/2015 06:45   Ct Cervical Spine Wo Contrast  04/14/2015  CLINICAL DATA:  Status post fall forward out of wheelchair, hitting forehead on floor. Concern for head or cervical spine injury. Initial encounter. EXAM: CT HEAD WITHOUT CONTRAST CT CERVICAL SPINE WITHOUT CONTRAST TECHNIQUE: Multidetector CT imaging of the head and cervical spine was  performed following the standard protocol without intravenous contrast. Multiplanar CT image reconstructions of the cervical spine were also generated. COMPARISON:  CT of the head and cervical spine performed 11/12/2014 FINDINGS: CT HEAD FINDINGS There is no evidence of acute infarction, mass lesion, or intra- or extra-axial hemorrhage on CT. Prominence of the ventricles and sulci reflects mild cortical volume loss. Scattered periventricular white matter change likely reflects small vessel ischemic microangiopathy. Mild chronic ischemic change is noted at the external capsule bilaterally. Mild cerebellar atrophy is noted. The brainstem and fourth ventricle are within normal limits. The cerebral hemispheres demonstrate grossly normal gray-white differentiation. No mass effect or midline shift is seen. There is no evidence of fracture; visualized osseous structures are unremarkable in appearance. The orbits are within normal limits. The paranasal sinuses and mastoid air cells are well-aerated. No significant  soft tissue abnormalities are seen. CT CERVICAL SPINE FINDINGS There is no evidence of fracture or subluxation. Diffuse degenerative change is noted about the dens. The patient is status post anterior cervical spinal fusion at C4-C7, with associated degenerative change. Underlying facet disease is noted. Vertebral bodies demonstrate normal height and alignment. Prevertebral soft tissues are within normal limits. The thyroid gland is unremarkable in appearance. Minimal scarring is noted at the lung apices. No significant soft tissue abnormalities are seen. IMPRESSION: 1. No evidence of traumatic intracranial injury or fracture. 2. No evidence of fracture or subluxation along the cervical spine. 3. Mild cortical volume loss and scattered small vessel ischemic microangiopathy. Mild chronic ischemic change at the external capsule bilaterally. 4. Degenerative change along the cervical spine, status post anterior  cervical spinal fusion at C4-C7. 5. Minimal scarring at the lung apices. Electronically Signed   By: Garald Balding M.D.   On: 04/14/2015 06:45   Dg Shoulder Left  04/14/2015  CLINICAL DATA:  Status post fall out of wheelchair, with left shoulder pain. Initial encounter. EXAM: LEFT SHOULDER - 2+ VIEW COMPARISON:  None. FINDINGS: There is no evidence of fracture or dislocation. The left humeral head is seated within the glenoid fossa. Degenerative change is noted at the left acromioclavicular joint. There is chronic superior subluxation of the left humeral head. No significant soft tissue abnormalities are seen. Cervical spinal fusion hardware is noted. The visualized portions of the left lung are clear. IMPRESSION: No evidence of fracture or dislocation. Electronically Signed   By: Garald Balding M.D.   On: 04/14/2015 06:37   Dg Hip Unilat  With Pelvis 2-3 Views Right  04/14/2015  CLINICAL DATA:  Status post fall forward out of wheelchair. Right hip pain. Initial encounter. EXAM: DG HIP (WITH OR WITHOUT PELVIS) 2-3V RIGHT COMPARISON:  Right hip radiographs performed 08/10/2014 FINDINGS: There is no evidence of fracture or dislocation. Both femoral heads are seated normally within their respective acetabula. The proximal right femur appears intact. Mild degenerative change is noted along the lower lumbar spine. Sclerotic change is seen at the sacroiliac joints. The visualized bowel gas pattern is grossly unremarkable in appearance. Scattered phleboliths are noted within the pelvis. IMPRESSION: No evidence of fracture or dislocation. Electronically Signed   By: Garald Balding M.D.   On: 04/14/2015 06:34   I have personally reviewed and evaluated these images and lab results as part of my medical decision-making.   MDM   Final diagnoses:  Fall  Fall at nursing home, initial encounter  Contusion of left shoulder, initial encounter  Pain in right hip    Fall with apparent injury to left shoulder.  Also pain in right hip from prior fall. These will be sent for x-rays. Based on multiple falls, CT will also be obtained of head and cervical spine. Old records are reviewed showing other ED visits for falls.  CTs and x-rays showed no evidence of acute bony injury or intracranial injury. She is discharged back to her nursing care facility.  Delora Fuel, MD [phone]

## 2015-04-14 NOTE — ED Notes (Signed)
Pt is from [facility] where the patient was sitting in her wheelchair and apparently fell forward out of the wheelchair.  Pt c/o pain to left arm and shoulder.  Per staff at facility, pt fell a few days ago and c/o right hip pain but has not been evaluated for same.

## 2015-04-14 NOTE — Discharge Instructions (Signed)
Fall Prevention in the Home  Falls can cause injuries and can affect people from all age groups. There are many simple things that you can do to make your home safe and to help prevent falls. WHAT CAN I DO ON THE OUTSIDE OF MY HOME?  Regularly repair the edges of walkways and driveways and fix any cracks.  Remove high doorway thresholds.  Trim any shrubbery on the main path into your home.  Use bright outdoor lighting.  Clear walkways of debris and clutter, including tools and rocks.  Regularly check that handrails are securely fastened and in good repair. Both sides of any steps should have handrails.  Install guardrails along the edges of any raised decks or porches.  Have leaves, snow, and ice cleared regularly.  Use sand or salt on walkways during winter months.  In the garage, clean up any spills right away, including grease or oil spills. WHAT CAN I DO IN THE BATHROOM?  Use night lights.  Install grab bars by the toilet and in the tub and shower. Do not use towel bars as grab bars.  Use non-skid mats or decals on the floor of the tub or shower.  If you need to sit down while you are in the shower, use a plastic, non-slip stool.[place] Kitchen  Keep the floor dry. Immediately clean up any water that spills on the floor.  Remove soap buildup in the tub or shower on a regular basis.  Attach bath mats securely with double-sided non-slip rug tape.  Remove throw rugs and other tripping hazards from the floor. WHAT CAN I DO IN THE BEDROOM?  Use night lights.  Make sure that a bedside light is easy to reach.  Do not use oversized bedding that drapes onto the floor.  Have a firm chair that has side arms to use for getting dressed.  Remove throw rugs and other tripping hazards from the floor. WHAT CAN I DO IN THE KITCHEN?   Clean up any spills right away.  Avoid walking on wet floors.  Place frequently used items in easy-to-reach places.  If you need to reach for something  above you, use a sturdy step stool that has a grab bar.  Keep electrical cables out of the way.  Do not use floor polish or wax that makes floors slippery. If you have to use wax, make sure that it is non-skid floor wax.  Remove throw rugs and other tripping hazards from the floor. WHAT CAN I DO IN THE STAIRWAYS?  Do not leave any items on the stairs.  Make sure that there are handrails on both sides of the stairs. Fix handrails that are broken or loose. Make sure that handrails are as long as the stairways.  Check any carpeting to make sure that it is firmly attached to the stairs. Fix any carpet that is loose or worn.  Avoid having throw rugs at the top or bottom of stairways, or secure the rugs with carpet tape to prevent them from moving.  Make sure that you have a light switch at the top of the stairs and the bottom of the stairs. If you do not have them, have them installed. WHAT ARE SOME OTHER FALL PREVENTION TIPS?  Wear closed-toe shoes that fit well and support your feet. Wear shoes that have rubber soles or low heels.  When you use a stepladder, make sure that it is completely opened and that the sides are firmly locked. Have someone hold the ladder while you  are using it. Do not climb a closed stepladder.  Add color or contrast paint or tape to grab bars and handrails in your home. Place contrasting color strips on the first and last steps.  Use mobility aids as needed, such as canes, walkers, scooters, and crutches.  Turn on lights if it is dark. Replace any light bulbs that burn out.  Set up furniture so that there are clear paths. Keep the furniture in the same spot.  Fix any uneven floor surfaces.  Choose a carpet design that does not hide the edge of steps of a stairway.  Be aware of any and all pets.  Review your medicines with your healthcare provider. Some medicines can cause dizziness or changes in blood pressure, which increase your risk of falling. Talk  with your health care provider about other ways that you can decrease your risk of falls. This may include working with a physical therapist or trainer to improve your strength, balance, and endurance.   This information is not intended to replace advice given to you by your health care provider. Make sure you discuss any questions you have with your health care provider.   Document Released: 04/25/2002 Document Revised: 09/19/2014 Document Reviewed: 06/09/2014 Elsevier Interactive Patient Education 2016 [place] A contusion is a deep bruise. Contusions are the result of a blunt injury to tissues and muscle fibers under the skin. The injury causes bleeding under the skin. The skin overlying the contusion may turn blue, purple, or yellow. Minor injuries will give you a painless contusion, but more severe contusions may stay painful and swollen for a few weeks.  CAUSES  This condition is usually caused by a blow, trauma, or direct force to an area of the body. SYMPTOMS  Symptoms of this condition include:  Swelling of the injured area.  Pain and tenderness in the injured area.  Discoloration. The area may have redness and then turn blue, purple, or yellow. DIAGNOSIS  This condition is diagnosed based on a physical exam and medical history. An X-ray, CT scan, or MRI may be needed to determine if there are any associated injuries, such as broken bones (fractures). TREATMENT  Specific treatment for this condition depends on what area of the body was injured. In general, the best treatment for a contusion is resting, icing, applying pressure to (compression), and elevating the injured area. This is often called the RICE strategy. Over-the-counter anti-inflammatory medicines may also be recommended for pain control.  HOME CARE INSTRUCTIONS   Rest the injured area.  If directed, apply ice to the injured area:  Put ice in a plastic bag.  Place a towel between your skin and  the bag.  Leave the ice on for 20 minutes, 2-3 times per day.  If directed, apply light compression to the injured area using an elastic bandage. Make sure the bandage is not wrapped too tightly. Remove and reapply the bandage as directed by your health care provider.  If possible, raise (elevate) the injured area above the level of your heart while you are sitting or lying down.  Take over-the-counter and prescription medicines only as told by your health care provider. SEEK MEDICAL CARE IF:  Your symptoms do not improve after several days of treatment.  Your symptoms get worse.  You have difficulty moving the injured area. SEEK IMMEDIATE MEDICAL CARE IF:   You have severe pain.  You have numbness in a hand or foot.  Your hand or foot turns  pale or cold.   This information is not intended to replace advice given to you by your health care provider. Make sure you discuss any questions you have with your health care provider.   Document Released: 02/12/2005 Document Revised: 01/24/2015 Document Reviewed: 09/20/2014 Elsevier Interactive Patient Education [employer].

## 2015-04-14 NOTE — ED Notes (Signed)
Report given to [facility] assisted living and ALF to send transportation from home to pick up pt.

## 2015-04-19 DIAGNOSIS — L0291 Cutaneous abscess, unspecified: Secondary | ICD-10-CM | POA: Diagnosis not present

## 2015-04-19 DIAGNOSIS — Z1389 Encounter for screening for other disorder: Secondary | ICD-10-CM | POA: Diagnosis not present

## 2015-04-19 DIAGNOSIS — M479 Spondylosis, unspecified: Secondary | ICD-10-CM | POA: Diagnosis not present

## 2015-04-19 DIAGNOSIS — Z681 Body mass index (BMI) 19 or less, adult: Secondary | ICD-10-CM | POA: Diagnosis not present

## 2015-04-19 DIAGNOSIS — R4182 Altered mental status, unspecified: Secondary | ICD-10-CM | POA: Diagnosis not present

## 2015-04-25 ENCOUNTER — Emergency Department (HOSPITAL_COMMUNITY): Payer: Medicare Other

## 2015-04-25 ENCOUNTER — Encounter (HOSPITAL_COMMUNITY): Payer: Self-pay | Admitting: Cardiology

## 2015-04-25 ENCOUNTER — Inpatient Hospital Stay (HOSPITAL_COMMUNITY)
Admission: EM | Admit: 2015-04-25 | Discharge: 2015-04-27 | DRG: 682 | Disposition: A | Discharge status: Skilled Nursing Facility | Payer: Medicare Other | Attending: Internal Medicine | Admitting: Internal Medicine

## 2015-04-25 DIAGNOSIS — Z66 Do not resuscitate: Secondary | ICD-10-CM | POA: Diagnosis not present

## 2015-04-25 DIAGNOSIS — J449 Chronic obstructive pulmonary disease, unspecified: Secondary | ICD-10-CM | POA: Diagnosis not present

## 2015-04-25 DIAGNOSIS — R509 Fever, unspecified: Secondary | ICD-10-CM | POA: Diagnosis not present

## 2015-04-25 DIAGNOSIS — Z681 Body mass index (BMI) 19 or less, adult: Secondary | ICD-10-CM

## 2015-04-25 DIAGNOSIS — J45909 Unspecified asthma, uncomplicated: Secondary | ICD-10-CM | POA: Diagnosis not present

## 2015-04-25 DIAGNOSIS — I10 Essential (primary) hypertension: Secondary | ICD-10-CM | POA: Diagnosis not present

## 2015-04-25 DIAGNOSIS — E860 Dehydration: Secondary | ICD-10-CM | POA: Diagnosis not present

## 2015-04-25 DIAGNOSIS — Z7982 Long term (current) use of aspirin: Secondary | ICD-10-CM

## 2015-04-25 DIAGNOSIS — N179 Acute kidney failure, unspecified: Secondary | ICD-10-CM | POA: Diagnosis not present

## 2015-04-25 DIAGNOSIS — Z8673 Personal history of transient ischemic attack (TIA), and cerebral infarction without residual deficits: Secondary | ICD-10-CM | POA: Diagnosis not present

## 2015-04-25 DIAGNOSIS — Z79899 Other long term (current) drug therapy: Secondary | ICD-10-CM | POA: Diagnosis not present

## 2015-04-25 DIAGNOSIS — K219 Gastro-esophageal reflux disease without esophagitis: Secondary | ICD-10-CM | POA: Diagnosis present

## 2015-04-25 DIAGNOSIS — K279 Peptic ulcer, site unspecified, unspecified as acute or chronic, without hemorrhage or perforation: Secondary | ICD-10-CM | POA: Diagnosis present

## 2015-04-25 DIAGNOSIS — R4182 Altered mental status, unspecified: Secondary | ICD-10-CM

## 2015-04-25 DIAGNOSIS — E43 Unspecified severe protein-calorie malnutrition: Secondary | ICD-10-CM | POA: Diagnosis not present

## 2015-04-25 DIAGNOSIS — G9340 Encephalopathy, unspecified: Secondary | ICD-10-CM | POA: Diagnosis present

## 2015-04-25 DIAGNOSIS — M1990 Unspecified osteoarthritis, unspecified site: Secondary | ICD-10-CM | POA: Diagnosis not present

## 2015-04-25 DIAGNOSIS — E875 Hyperkalemia: Secondary | ICD-10-CM | POA: Diagnosis not present

## 2015-04-25 DIAGNOSIS — E119 Type 2 diabetes mellitus without complications: Secondary | ICD-10-CM | POA: Diagnosis present

## 2015-04-25 DIAGNOSIS — E039 Hypothyroidism, unspecified: Secondary | ICD-10-CM | POA: Diagnosis present

## 2015-04-25 DIAGNOSIS — Z981 Arthrodesis status: Secondary | ICD-10-CM

## 2015-04-25 DIAGNOSIS — F0390 Unspecified dementia without behavioral disturbance: Secondary | ICD-10-CM | POA: Diagnosis present

## 2015-04-25 DIAGNOSIS — N289 Disorder of kidney and ureter, unspecified: Secondary | ICD-10-CM | POA: Diagnosis not present

## 2015-04-25 DIAGNOSIS — R531 Weakness: Secondary | ICD-10-CM | POA: Diagnosis not present

## 2015-04-25 HISTORY — DX: Disorder of kidney and ureter, unspecified: N28.9

## 2015-04-25 HISTORY — DX: Unspecified dementia, unspecified severity, without behavioral disturbance, psychotic disturbance, mood disturbance, and anxiety: F03.90

## 2015-04-25 LAB — URINE MICROSCOPIC-ADD ON
BACTERIA UA: NONE SEEN
Squamous Epithelial / LPF: NONE SEEN
WBC, UA: NONE SEEN WBC/hpf (ref 0–5)

## 2015-04-25 LAB — CBC WITH DIFFERENTIAL/PLATELET
BASOS ABS: 0 10*3/uL (ref 0.0–0.1)
Basophils Relative: 0 %
EOS PCT: 2 %
Eosinophils Absolute: 0.2 10*3/uL (ref 0.0–0.7)
HCT: 41.3 % (ref 36.0–46.0)
Hemoglobin: 14.4 g/dL (ref 12.0–15.0)
LYMPHS PCT: 17 %
Lymphs Abs: 1.5 10*3/uL (ref 0.7–4.0)
MCH: 33 pg (ref 26.0–34.0)
MCHC: 34.9 g/dL (ref 30.0–36.0)
MCV: 94.7 fL (ref 78.0–100.0)
MONO ABS: 0.3 10*3/uL (ref 0.1–1.0)
MONOS PCT: 4 %
Neutro Abs: 6.7 10*3/uL (ref 1.7–7.7)
Neutrophils Relative %: 77 %
PLATELETS: 196 10*3/uL (ref 150–400)
RBC: 4.36 MIL/uL (ref 3.87–5.11)
RDW: 15.5 % (ref 11.5–15.5)
WBC: 8.7 10*3/uL (ref 4.0–10.5)

## 2015-04-25 LAB — COMPREHENSIVE METABOLIC PANEL
ALBUMIN: 3.6 g/dL (ref 3.5–5.0)
ALK PHOS: 57 U/L (ref 38–126)
ALT: 20 U/L (ref 14–54)
AST: 44 U/L — AB (ref 15–41)
Anion gap: 9 (ref 5–15)
BILIRUBIN TOTAL: 0.5 mg/dL (ref 0.3–1.2)
BUN: 70 mg/dL — AB (ref 6–20)
CALCIUM: 8.9 mg/dL (ref 8.9–10.3)
CO2: 24 mmol/L (ref 22–32)
Chloride: 102 mmol/L (ref 101–111)
Creatinine, Ser: 2.86 mg/dL — ABNORMAL HIGH (ref 0.44–1.00)
GFR calc Af Amer: 15 mL/min — ABNORMAL LOW (ref >60)
GFR calc non Af Amer: 13 mL/min — ABNORMAL LOW (ref >60)
GLUCOSE: 136 mg/dL — AB (ref 65–99)
POTASSIUM: 5.7 mmol/L — AB (ref 3.5–5.1)
Sodium: 135 mmol/L (ref 135–145)
TOTAL PROTEIN: 7.9 g/dL (ref 6.5–8.1)

## 2015-04-25 LAB — URINALYSIS, ROUTINE W REFLEX MICROSCOPIC
BILIRUBIN URINE: NEGATIVE
GLUCOSE, UA: NEGATIVE mg/dL
HGB URINE DIPSTICK: NEGATIVE
KETONES UR: NEGATIVE mg/dL
LEUKOCYTES UA: NEGATIVE
Nitrite: NEGATIVE
Specific Gravity, Urine: 1.015 (ref 1.005–1.030)
pH: 6 (ref 5.0–8.0)

## 2015-04-25 LAB — LACTIC ACID, PLASMA
LACTIC ACID, VENOUS: 2 mmol/L (ref 0.5–2.0)
Lactic Acid, Venous: 1.9 mmol/L (ref 0.5–2.0)

## 2015-04-25 LAB — TROPONIN I: Troponin I: 0.06 ng/mL — ABNORMAL HIGH (ref <0.031)

## 2015-04-25 MED ORDER — SODIUM CHLORIDE 0.9 % IV BOLUS (SEPSIS)
500.0000 mL | Freq: Once | INTRAVENOUS | Status: AC
  Administered 2015-04-25: 500 mL via INTRAVENOUS

## 2015-04-25 MED ORDER — SODIUM CHLORIDE 0.9 % IV SOLN
500.0000 mg | INTRAVENOUS | Status: DC
  Filled 2015-04-25: qty 500

## 2015-04-25 MED ORDER — LEVOTHYROXINE SODIUM 25 MCG PO TABS
25.0000 ug | ORAL_TABLET | Freq: Every day | ORAL | Status: DC
  Administered 2015-04-27: 25 ug via ORAL
  Filled 2015-04-25 (×2): qty 1

## 2015-04-25 MED ORDER — ALBUTEROL SULFATE (2.5 MG/3ML) 0.083% IN NEBU: 2.5000 mg | INHALATION_SOLUTION | RESPIRATORY_TRACT | Status: DC | PRN

## 2015-04-25 MED ORDER — THEOPHYLLINE ER 400 MG PO TB24
400.0000 mg | ORAL_TABLET | Freq: Every day | ORAL | Status: DC
  Administered 2015-04-27: 400 mg via ORAL
  Filled 2015-04-25 (×5): qty 1

## 2015-04-25 MED ORDER — ACETAMINOPHEN 650 MG RE SUPP: 650.0000 mg | Freq: Four times a day (QID) | RECTAL | Status: DC | PRN

## 2015-04-25 MED ORDER — SODIUM CHLORIDE 0.9 % IJ SOLN
3.0000 mL | Freq: Two times a day (BID) | INTRAMUSCULAR | Status: DC
  Administered 2015-04-25 – 2015-04-27 (×2): 3 mL via INTRAVENOUS

## 2015-04-25 MED ORDER — SODIUM CHLORIDE 0.9 % IV SOLN
INTRAVENOUS | Status: DC
  Administered 2015-04-25: 12:00:00 via INTRAVENOUS

## 2015-04-25 MED ORDER — HEPARIN SODIUM (PORCINE) 5000 UNIT/ML IJ SOLN
5000.0000 [IU] | Freq: Three times a day (TID) | INTRAMUSCULAR | Status: DC
  Administered 2015-04-25 – 2015-04-27 (×6): 5000 [IU] via SUBCUTANEOUS
  Filled 2015-04-25 (×5): qty 1

## 2015-04-25 MED ORDER — VANCOMYCIN HCL IN DEXTROSE 1-5 GM/200ML-% IV SOLN
1000.0000 mg | Freq: Once | INTRAVENOUS | Status: AC
  Administered 2015-04-25: 1000 mg via INTRAVENOUS
  Filled 2015-04-25: qty 200

## 2015-04-25 MED ORDER — ACETAMINOPHEN 325 MG PO TABS: 650.0000 mg | ORAL_TABLET | Freq: Four times a day (QID) | ORAL | Status: DC | PRN

## 2015-04-25 MED ORDER — PIPERACILLIN SOD-TAZOBACTAM SO 2.25 (2-0.25) G IV SOLR
2.2500 g | Freq: Three times a day (TID) | INTRAVENOUS | Status: DC
  Administered 2015-04-25 – 2015-04-27 (×6): 2.25 g via INTRAVENOUS
  Filled 2015-04-25 (×16): qty 2.25

## 2015-04-25 MED ORDER — LEVETIRACETAM 250 MG PO TABS
250.0000 mg | ORAL_TABLET | Freq: Every day | ORAL | Status: DC
  Administered 2015-04-25 – 2015-04-26 (×2): 250 mg via ORAL
  Filled 2015-04-25 (×2): qty 1

## 2015-04-25 MED ORDER — SODIUM CHLORIDE 0.9 % IV SOLN
INTRAVENOUS | Status: DC
  Administered 2015-04-27: 09:00:00 via INTRAVENOUS

## 2015-04-25 MED ORDER — PIPERACILLIN-TAZOBACTAM IN DEX 2-0.25 GM/50ML IV SOLN: 2.2500 g | Freq: Three times a day (TID) | INTRAVENOUS | Status: DC

## 2015-04-25 NOTE — ED Notes (Signed)
Per caretaker,  Pt is on an antibiodic for a UTI.  This morning pt has been dropping things and shaking.

## 2015-04-25 NOTE — ED Notes (Signed)
Bladder scan shows 475cc urine.  No decubitus or skin breakdown to sacrum.

## 2015-04-25 NOTE — H&P (Addendum)
Patient Demographics  [name], is a 81 y.o. female  MRN: [id number]   DOB - [birth year]  Admit Date - 04/25/2015  Outpatient Primary MD for the patient is [name], MD   With History of -  Past Medical History  Diagnosis Date  . Urinary retention   . Gastritis   . Esophageal ulcer   . Salmonella sepsis ([place])   . Degenerative disk disease   . Spinal stenosis   . Asthma   . COPD (chronic obstructive pulmonary disease) ([place])   . Diabetes mellitus, type 2 ([place])   . Diverticulosis of colon   . GERD (gastroesophageal reflux disease)   . Hypertension   . Osteoarthritis   . Hiatal hernia   . Pancreatitis   . S/P endoscopy 2009    Dr. Oneida Alar: gastritis  . Diverticulitis   . S/P endoscopy March 2012    Dr. Gala Romney: NSAID induced ulcer  . Hypothyroidism   . MGUS (monoclonal gammopathy of unknown significance)   . Stroke ([place])   . Dementia   . Renal disorder       Past Surgical History  Procedure Laterality Date  . Cervical fusion    . Cholecystectomy    . Ercp N/A 02/02/2014    Procedure: ENDOSCOPIC RETROGRADE CHOLANGIOPANCREATOGRAPHY (ERCP), STONE BASKET EXTRACTION;  Surgeon: Daneil Dolin, MD;  Location: [facility];  Service: Endoscopy;  Laterality: N/A;  . Sphincterotomy N/A 02/02/2014    Procedure: SPHINCTEROTOMY;  Surgeon: Daneil Dolin, MD;  Location: [facility];  Service: Endoscopy;  Laterality: N/A;  . Balloon dilation N/A 02/02/2014    Procedure: BALLOON DILATION;  Surgeon: Daneil Dolin, MD;  Location: [facility];  Service: Endoscopy;  Laterality: N/A;  . Biliary stent placement N/A 02/02/2014    Procedure: BILIARY STENT PLACEMENT;  Surgeon: Daneil Dolin, MD;  Location: [facility];  Service: Endoscopy;  Laterality: N/A;  . Spyglass cholangioscopy N/A 02/02/2014    Procedure: [id number] CHOLANGIOSCOPY;  Surgeon: Daneil Dolin, MD;  Location: [facility];  Service: Endoscopy;  Laterality: N/A;  . Esophagogastroduodenoscopy N/A 02/05/2014    Procedure:  ESOPHAGOGASTRODUODENOSCOPY (EGD);  Surgeon: [name], MD;  Location: [facility];  Service: Endoscopy;  Laterality: N/A;    in for   Chief Complaint  Patient presents with  . Weakness     HPI  [name]  is a 81 y.o. female, with past medical history of COPD, GERD, diabetes mellitus type 2, hypothyroidism, osteoarthritis, presents due to progressive weakness, and lethargy, patient is a nursing home resident, with advanced dementia, ambulates occasionally with a walker, mainly on wheelchair, over the last few days patient been having progressive functional decline, worsening appetite, less verbal, refusing her medication, oral intake, has been on Bactrim to treat UTI, in ED workup was significant for acute renal failure with creatinine of 2.8, baseline is 1.2, as well fever 100.8, negative urinalysis, chest x-ray with no evidence of pneumonia, patient had negative meningeal signs, daughter reports patient had vomiting before few days, does not recall if she had any diarrhea, hospitalist requested to admit.    Review of Systems   Patient can't provide any review of systems,   Social History Social History  Substance Use Topics  . Smoking status: Never Smoker   . Smokeless tobacco: Never Used  . Alcohol Use: No     Family History Family History  Problem Relation Age of Onset  . Diabetes Mother     Prior to  Admission medications   Medication Sig Start Date End Date Taking? Authorizing Provider  aspirin 81 MG tablet Take 81 mg by mouth daily.   Yes Historical Provider, MD  Calcium Citrate-Vitamin D 200-250 MG-UNIT TABS Take 1 tablet by mouth daily.   Yes Historical Provider, MD  ferrous sulfate 325 (65 FE) MG tablet Take 325 mg by mouth daily with breakfast.   Yes Historical Provider, MD  furosemide (LASIX) 20 MG tablet Take 20 mg by mouth daily. 04/04/15  Yes Historical Provider, MD  HYDROcodone-acetaminophen (NORCO/VICODIN) 5-325 MG per tablet Take 1 tablet by  mouth every 6 (six) hours as needed for moderate pain. 02/08/14  Yes [name], DO  levETIRAcetam (KEPPRA) 250 MG tablet Take 1 tablet (250 mg total) by mouth daily. Patient taking differently: Take 250 mg by mouth at bedtime.  02/09/14  Yes [name], DO  levothyroxine (SYNTHROID, LEVOTHROID) 25 MCG tablet Take 25 mcg by mouth daily before breakfast.   Yes Historical Provider, MD  montelukast (SINGULAIR) 10 MG tablet Take 10 mg by mouth every morning.    Yes Historical Provider, MD  Multiple Vitamin (DAILY VITAMIN PO) Take 1 tablet by mouth daily.   Yes Historical Provider, MD  polyethylene glycol (MIRALAX / GLYCOLAX) packet Take 17 g by mouth daily as needed for mild constipation.   Yes Historical Provider, MD  solifenacin (VESICARE) 5 MG tablet Take 5 mg by mouth daily.   Yes Historical Provider, MD  sulfamethoxazole-trimethoprim (BACTRIM DS,SEPTRA DS) 800-160 MG tablet Take 1 tablet by mouth 2 (two) times daily.   Yes Historical Provider, MD  tamsulosin (FLOMAX) 0.4 MG CAPS capsule Take 1 capsule (0.4 mg total) by mouth daily. 02/08/14  Yes [name], DO  theophylline (UNIPHYL) 400 MG 24 hr tablet Take 400 mg by mouth daily.   Yes Historical Provider, MD  aspirin 325 MG tablet Take 1 tablet (325 mg total) by mouth daily. Patient not taking: Reported on 04/25/2015 02/08/14   Velta Addison Mikhail, DO  carbamide peroxide (EARWAX TREATMENT DROPS) 6.5 % otic solution Place 5 drops into both ears 2 (two) times daily as needed (build up).    Historical Provider, MD  cephALEXin (KEFLEX) 500 MG capsule Take 1 capsule (500 mg total) by mouth 4 (four) times daily. Patient not taking: Reported on 11/12/2014 10/27/14   Milton Ferguson, MD  ciprofloxacin (CIPRO) 500 MG tablet Take 500 mg by mouth 2 (two) times daily. For 7 days start 11/10/14    Historical Provider, MD  griseofulvin (GRIS-PEG) 250 MG tablet Take 375 mg by mouth daily. 11/08/14   Historical Provider, MD    No Known Allergies  Physical  Exam  Vitals  Blood pressure 120/55, pulse 88, temperature 100.8 F (38.2 C), temperature source Rectal, resp. rate 23, SpO2 100 %.   1. General , frail, elderly thin-appearing female lying in bed in NAD,    2. Lethargic, responsive to loud verbal stimuli.  3. Could not perform neurological exam giving her mental status, but appears to be grossly moving her extremities without significant deficits..  4. Ears and Eyes appear Normal, Conjunctivae clear, dry oral mucosa.  5. Supple Neck, No JVD, No cervical lymphadenopathy appriciated, No Carotid Bruits.  6. Symmetrical Chest wall movement, Good air movement bilaterally, CTAB.  7. RRR, No Gallops, Rubs or Murmurs, No Parasternal Heave.  8. Positive Bowel Sounds, Abdomen Soft, No tenderness, No organomegaly appriciated,No rebound -guarding or rigidity.  9.  No Cyanosis, sluggish Skin Turgor, No Skin Rash or Bruise.  10. Good muscle tone,  joints appear normal , no effusions, Normal ROM.   Data Review  CBC  Recent Labs Lab 04/25/15 0907  WBC 8.7  HGB 14.4  HCT 41.3  PLT 196  MCV 94.7  MCH 33.0  MCHC 34.9  RDW 15.5  LYMPHSABS 1.5  MONOABS 0.3  EOSABS 0.2  BASOSABS 0.0   ------------------------------------------------------------------------------------------------------------------  Chemistries   Recent Labs Lab 04/25/15 0907  NA 135  K 5.7*  CL 102  CO2 24  GLUCOSE 136*  BUN 70*  CREATININE 2.86*  CALCIUM 8.9  AST 44*  ALT 20  ALKPHOS 57  BILITOT 0.5   ------------------------------------------------------------------------------------------------------------------ estimated creatinine clearance is 8.2 mL/min (by C-G formula based on Cr of 2.86). ------------------------------------------------------------------------------------------------------------------ No results for input(s): TSH, T4TOTAL, T3FREE, THYROIDAB in the last 72 hours.  Invalid input(s): FREET3   Coagulation profile No  results for input(s): INR, PROTIME in the last 168 hours. ------------------------------------------------------------------------------------------------------------------- No results for input(s): DDIMER in the last 72 hours. -------------------------------------------------------------------------------------------------------------------  Cardiac Enzymes  Recent Labs Lab 04/25/15 0907  TROPONINI 0.06*   ------------------------------------------------------------------------------------------------------------------ Invalid input(s): POCBNP   ---------------------------------------------------------------------------------------------------------------  Urinalysis    Component Value Date/Time   COLORURINE YELLOW 04/25/2015 0919   APPEARANCEUR CLEAR 04/25/2015 0919   LABSPEC 1.015 04/25/2015 0919   PHURINE 6.0 04/25/2015 0919   GLUCOSEU NEGATIVE 04/25/2015 0919   HGBUR NEGATIVE 04/25/2015 0919   BILIRUBINUR NEGATIVE 04/25/2015 0919   KETONESUR NEGATIVE 04/25/2015 0919   PROTEINUR TRACE* 04/25/2015 0919   UROBILINOGEN 0.2 10/27/2014 1618   NITRITE NEGATIVE 04/25/2015 0919   LEUKOCYTESUR NEGATIVE 04/25/2015 0919    ----------------------------------------------------------------------------------------------------------------  Imaging results:   Dg Chest Portable 1 View  04/25/2015  CLINICAL DATA:  Fever.  Asthma.  COPD. EXAM: PORTABLE CHEST 1 VIEW COMPARISON:  11/16/2014. FINDINGS: Heart size upper limits normal. Thoracic atherosclerosis. There could be a small LEFT pleural effusion. Severe degenerative change LEFT shoulder. No infiltrates or failure. IMPRESSION: There may be a small LEFT effusion but there is no significant pulmonary opacity or failure. Electronically Signed   By: Staci Righter M.D.   On: 04/25/2015 09:20        Assessment & Plan  Active Problems:   Essential hypertension   COPD (chronic obstructive pulmonary disease) (HCC)   PUD (peptic ulcer  disease)   Acute encephalopathy   Protein-calorie malnutrition, severe (HCC)   Dehydration   Dementia   Acute renal failure (ARF) (HCC)   Acute renal failure - This is most likely related to dehydration and volume depletion, as well possibly Bactrim contributing to it, but patient is significantly dehydrated on physical exam, bladder scan showing 450 mL, will monitor closely for urinary retention as well, continue with IV fluids, if no improvement with hydration, will check renal ultrasound and urine electrolytes.  Acute encephalopathy//fever - This is most likely related to infectious process, urinalysis is clean, but patient has been on Bactrim for 1 week, patient has negative meningeal signs, will send blood cultures, will start on broad spectrum IV antibiotic pending septic workup. - We'll check CT head without contrast. - We'll keep patient nothing by mouth until she is more awake.  History of COPD - GEN with when necessary nebs, resume theophylline.  Protein calorie malnutrition - Resume on supplements when able to take oral intake  Hypothyroidism - Continue with Synthroid  GERD/PUD - Continue with PPI   DVT Prophylaxis Heparin -    AM Labs Ordered, also please review Full Orders  Family Communication: Admission, patients condition  and plan of care including tests being ordered have been discussed with the patient daughter who indicate understanding and agree with the plan and Code Status.  Code Status DO NOT RESUSCITATE as confirmed with daughter Ms Berenice Primas  Likely DC to  SNF  Condition GUARDED    Time spent in minutes : 76 minutes    ELGERGAWY, DAWOOD M.D on 04/25/2015 at 10:52 AM  Between 7am to 7pm - Pager - [phone]  After 7pm go to [email or web address] - password [email or web address]  And look for the night coverage person covering me after hours  [facility] Office  [phone]

## 2015-04-25 NOTE — ED Provider Notes (Signed)
CSN: [id number]     Arrival date & time 04/25/15  [id number] History   First MD Initiated Contact with Patient 04/25/15 [phone]     Chief Complaint  Patient presents with  . Weakness     Level V caveat due to dementia. Patient is a 81 y.o. female presenting with weakness. The history is provided by the patient, the EMS personnel and medical records.  Weakness   patient brought in for reported worsening weakness. Is being treated for urinary tract infection. Reportedly has been dropping things more than she would. Has history of dementia and cannot participate in the history. She is apparently on Bactrim.  Past Medical History  Diagnosis Date  . Urinary retention   . Gastritis   . Esophageal ulcer   . Salmonella sepsis ([place])   . Degenerative disk disease   . Spinal stenosis   . Asthma   . COPD (chronic obstructive pulmonary disease) ([place])   . Diabetes mellitus, type 2 ([place])   . Diverticulosis of colon   . GERD (gastroesophageal reflux disease)   . Hypertension   . Osteoarthritis   . Hiatal hernia   . Pancreatitis   . S/P endoscopy 2009    Dr. Oneida Alar: gastritis  . Diverticulitis   . S/P endoscopy March 2012    Dr. Gala Romney: NSAID induced ulcer  . Hypothyroidism   . MGUS (monoclonal gammopathy of unknown significance)   . Stroke ([place])   . Dementia   . Renal disorder    Past Surgical History  Procedure Laterality Date  . Cervical fusion    . Cholecystectomy    . Ercp N/A 02/02/2014    Procedure: ENDOSCOPIC RETROGRADE CHOLANGIOPANCREATOGRAPHY (ERCP), STONE BASKET EXTRACTION;  Surgeon: Daneil Dolin, MD;  Location: [facility];  Service: Endoscopy;  Laterality: N/A;  . Sphincterotomy N/A 02/02/2014    Procedure: SPHINCTEROTOMY;  Surgeon: Daneil Dolin, MD;  Location: [facility];  Service: Endoscopy;  Laterality: N/A;  . Balloon dilation N/A 02/02/2014    Procedure: BALLOON DILATION;  Surgeon: Daneil Dolin, MD;  Location: [facility];  Service: Endoscopy;  Laterality: N/A;  . Biliary stent  placement N/A 02/02/2014    Procedure: BILIARY STENT PLACEMENT;  Surgeon: Daneil Dolin, MD;  Location: [facility];  Service: Endoscopy;  Laterality: N/A;  . Spyglass cholangioscopy N/A 02/02/2014    Procedure: [id number] CHOLANGIOSCOPY;  Surgeon: Daneil Dolin, MD;  Location: [facility];  Service: Endoscopy;  Laterality: N/A;  . Esophagogastroduodenoscopy N/A 02/05/2014    Procedure: ESOPHAGOGASTRODUODENOSCOPY (EGD);  Surgeon: [name], MD;  [facility];  Service: Endoscopy;  Laterality: N/A;   Family History  Problem Relation Age of Onset  . Diabetes Mother    Social History  Substance Use Topics  . Smoking status: Never Smoker   . Smokeless tobacco: Never Used  . Alcohol Use: No   OB History    No data available     Review of Systems  Unable to perform ROS: Dementia  Neurological: Positive for weakness.      Allergies  Review of patient's allergies indicates no known allergies.  Home Medications   Prior to Admission medications   Medication Sig Start Date End Date Taking? Authorizing Provider  aspirin 81 MG tablet Take 81 mg by mouth daily.   Yes Historical Provider, MD  Calcium Citrate-Vitamin D 200-250 MG-UNIT TABS Take 1 tablet by mouth daily.   Yes Historical Provider, MD  ferrous sulfate 325 (65 FE) MG tablet Take 325 mg  by mouth daily with breakfast.   Yes Historical Provider, MD  furosemide (LASIX) 20 MG tablet Take 20 mg by mouth daily. 04/04/15  Yes Historical Provider, MD  HYDROcodone-acetaminophen (NORCO/VICODIN) 5-325 MG per tablet Take 1 tablet by mouth every 6 (six) hours as needed for moderate pain. 02/08/14  Yes [name], DO  levETIRAcetam (KEPPRA) 250 MG tablet Take 1 tablet (250 mg total) by mouth daily. Patient taking differently: Take 250 mg by mouth at bedtime.  02/09/14  Yes [name], DO  levothyroxine (SYNTHROID, LEVOTHROID) 25 MCG tablet Take 25 mcg by mouth daily before breakfast.   Yes Historical Provider, MD  montelukast  (SINGULAIR) 10 MG tablet Take 10 mg by mouth every morning.    Yes Historical Provider, MD  Multiple Vitamin (DAILY VITAMIN PO) Take 1 tablet by mouth daily.   Yes Historical Provider, MD  polyethylene glycol (MIRALAX / GLYCOLAX) packet Take 17 g by mouth daily as needed for mild constipation.   Yes Historical Provider, MD  solifenacin (VESICARE) 5 MG tablet Take 5 mg by mouth daily.   Yes Historical Provider, MD  sulfamethoxazole-trimethoprim (BACTRIM DS,SEPTRA DS) 800-160 MG tablet Take 1 tablet by mouth 2 (two) times daily.   Yes Historical Provider, MD  tamsulosin (FLOMAX) 0.4 MG CAPS capsule Take 1 capsule (0.4 mg total) by mouth daily. 02/08/14  Yes [name], DO  theophylline (UNIPHYL) 400 MG 24 hr tablet Take 400 mg by mouth daily.   Yes Historical Provider, MD  aspirin 325 MG tablet Take 1 tablet (325 mg total) by mouth daily. Patient not taking: Reported on 04/25/2015 02/08/14   Velta Addison Mikhail, DO  carbamide peroxide (EARWAX TREATMENT DROPS) 6.5 % otic solution Place 5 drops into both ears 2 (two) times daily as needed (build up).    Historical Provider, MD  cephALEXin (KEFLEX) 500 MG capsule Take 1 capsule (500 mg total) by mouth 4 (four) times daily. Patient not taking: Reported on 11/12/2014 10/27/14   Milton Ferguson, MD  ciprofloxacin (CIPRO) 500 MG tablet Take 500 mg by mouth 2 (two) times daily. For 7 days start 11/10/14    Historical Provider, MD  griseofulvin (GRIS-PEG) 250 MG tablet Take 375 mg by mouth daily. 11/08/14   Historical Provider, MD   BP 102/72 mmHg  Pulse 96  Temp(Src) 99.8 F (37.7 C) (Rectal)  Resp 20  Ht 5\' 3"  (1.6 m)  Wt 104 lb 15 oz (47.6 kg)  BMI 18.59 kg/m2  SpO2 95% Physical Exam  Constitutional: She appears well-developed.  HENT:  Head: Atraumatic.  Cardiovascular: Normal rate.   Pulmonary/Chest: Effort normal. She has no wheezes. She has no rales.  Abdominal: Soft. There is no tenderness.  Musculoskeletal: She exhibits no tenderness.   Neurological:  Patient would look to voice for me. Would squeeze hands bilaterally but not to command. Moving all extremities. Breathing spontaneously.  Skin: Skin is warm.    ED Course  Procedures (including critical care time) Labs Review Labs Reviewed  COMPREHENSIVE METABOLIC PANEL - Abnormal; Notable for the following:    Potassium 5.7 (*)    Glucose, Bld 136 (*)    BUN 70 (*)    Creatinine, Ser 2.86 (*)    AST 44 (*)    GFR calc non Af Amer 13 (*)    GFR calc Af Amer 15 (*)    All other components within normal limits  URINALYSIS, ROUTINE W REFLEX MICROSCOPIC (NOT AT [facility]) - Abnormal; Notable for the following:    Protein, ur  TRACE (*)    All other components within normal limits  TROPONIN I - Abnormal; Notable for the following:    Troponin I 0.06 (*)    All other components within normal limits  URINE CULTURE  CULTURE, BLOOD (ROUTINE X 2)  CULTURE, BLOOD (ROUTINE X 2)  CBC WITH DIFFERENTIAL/PLATELET  LACTIC ACID, PLASMA  LACTIC ACID, PLASMA  URINE MICROSCOPIC-ADD ON    Imaging Review Dg Chest Portable 1 View  04/25/2015  CLINICAL DATA:  Fever.  Asthma.  COPD. EXAM: PORTABLE CHEST 1 VIEW COMPARISON:  11/16/2014. FINDINGS: Heart size upper limits normal. Thoracic atherosclerosis. There could be a small LEFT pleural effusion. Severe degenerative change LEFT shoulder. No infiltrates or failure. IMPRESSION: There may be a small LEFT effusion but there is no significant pulmonary opacity or failure. Electronically Signed   By: Staci Righter M.D.   On: 04/25/2015 09:20   I have personally reviewed and evaluated these images and lab results as part of my medical decision-making.   EKG Interpretation   Date/Time:  Wednesday April 25 2015 08:40:11 EST Ventricular Rate:  100 PR Interval:  154 QRS Duration: 71 QT Interval:  338 QTC Calculation: 436 R Axis:   57 Text Interpretation:  Sinus tachycardia Right atrial enlargement Anterior  infarct, old SINCE LAST TRACING  HEART RATE HAS INCREASED Confirmed by  Alvino Chapel  MD, Ovid Curd [phone]) on 04/25/2015 8:45:33 AM      MDM   Final diagnoses:  Renal insufficiency  Hyperkalemia  Fever, unspecified fever cause    Patient with altered mental status. Has low-grade temperature. Has been on Bactrim. Worsening renal function with mildly elevated creatinine and potassium. No clear source of infection since ear looks better now, however does not appear to be meningitis. We'll give IV fluids and admitted to internal medicine.    Davonna Belling, MD 04/25/15 [phone]

## 2015-04-25 NOTE — Progress Notes (Signed)
ANTIBIOTIC CONSULT NOTE - INITIAL  Pharmacy Consult for Vancomycin and Zosyn Indication: rule out sepsis  No Known Allergies  Patient Measurements:  Actual pt wt: 49.9kg  Vital Signs: Temp: 100.8 F (38.2 C) (12/07 0840) Temp Source: Rectal (12/07 0840) BP: 120/55 mmHg (12/07 1030) Pulse Rate: 88 (12/07 1030) Intake/Output from previous day:   Intake/Output from this shift: Total I/O In: -  Out: 13 [Urine:13]  Labs:  Recent Labs  04/25/15 0907  WBC 8.7  HGB 14.4  PLT 196  CREATININE 2.86*   Estimated Creatinine Clearance: 8.2 mL/min (by C-G formula based on Cr of 2.86). No results for input(s): VANCOTROUGH, VANCOPEAK, VANCORANDOM, GENTTROUGH, GENTPEAK, GENTRANDOM, TOBRATROUGH, TOBRAPEAK, TOBRARND, AMIKACINPEAK, AMIKACINTROU, AMIKACIN in the last 72 hours.   Microbiology: No results found for this or any previous visit (from the past 720 hour(s)).  Medical History: Past Medical History  Diagnosis Date  . Urinary retention   . Gastritis   . Esophageal ulcer   . Salmonella sepsis ([place])   . Degenerative disk disease   . Spinal stenosis   . Asthma   . COPD (chronic obstructive pulmonary disease) ([place])   . Diabetes mellitus, type 2 ([place])   . Diverticulosis of colon   . GERD (gastroesophageal reflux disease)   . Hypertension   . Osteoarthritis   . Hiatal hernia   . Pancreatitis   . S/P endoscopy 2009    Dr. Oneida Alar: gastritis  . Diverticulitis   . S/P endoscopy March 2012    Dr. Gala Romney: NSAID induced ulcer  . Hypothyroidism   . MGUS (monoclonal gammopathy of unknown significance)   . Stroke ([place])   . Dementia   . Renal disorder     Medications:  See med rec Assessment: 81 yo female with progressive weakness thought to be due to sepsis.  Febrile 100.8, CXray with no evidence of PNA. U/A clear . Pt has been vomiting. ARF.  WBC nl. Nursing home resident. Empiric tx with zosyn and vancomycin.  Goal of Therapy:  Vancomycin trough level 15-20  mcg/ml  Plan:  Zosyn 2.25gm IV q8h Vancomycin 1gm loading dose, then 500mg  IV q48h Measure antibiotic drug levels at steady state Follow up culture results  Monitor V/S and labs  Isac Sarna, BS Pharm D, BCPS Clinical Pharmacist Pager [phone] 04/25/2015,11:16 AM

## 2015-04-25 NOTE — Progress Notes (Signed)
Bedside swallow eval performed.  Patient had no issue with swallowing or coughing.  Lungs remain diminished.

## 2015-04-26 ENCOUNTER — Inpatient Hospital Stay (HOSPITAL_COMMUNITY): Payer: Medicare Other

## 2015-04-26 DIAGNOSIS — I10 Essential (primary) hypertension: Secondary | ICD-10-CM

## 2015-04-26 LAB — BASIC METABOLIC PANEL
Anion gap: 8 (ref 5–15)
BUN: 50 mg/dL — AB (ref 6–20)
CALCIUM: 8.1 mg/dL — AB (ref 8.9–10.3)
CO2: 21 mmol/L — ABNORMAL LOW (ref 22–32)
CREATININE: 2.04 mg/dL — AB (ref 0.44–1.00)
Chloride: 108 mmol/L (ref 101–111)
GFR calc non Af Amer: 19 mL/min — ABNORMAL LOW (ref >60)
GFR, EST AFRICAN AMERICAN: 22 mL/min — AB (ref >60)
Glucose, Bld: 99 mg/dL (ref 65–99)
Potassium: 5 mmol/L (ref 3.5–5.1)
SODIUM: 137 mmol/L (ref 135–145)

## 2015-04-26 LAB — CBC
HCT: 37.1 % (ref 36.0–46.0)
Hemoglobin: 12.5 g/dL (ref 12.0–15.0)
MCH: 32.1 pg (ref 26.0–34.0)
MCHC: 33.7 g/dL (ref 30.0–36.0)
MCV: 95.1 fL (ref 78.0–100.0)
PLATELETS: 180 10*3/uL (ref 150–400)
RBC: 3.9 MIL/uL (ref 3.87–5.11)
RDW: 15.6 % — AB (ref 11.5–15.5)
WBC: 7.9 10*3/uL (ref 4.0–10.5)

## 2015-04-26 LAB — URINE CULTURE: CULTURE: NO GROWTH

## 2015-04-26 NOTE — Care Management Note (Signed)
Case Management Note  Patient Details  Name: MICAHYA DOMBROSKY MRN: [id number] Date of Birth: 21-Feb-1913  Subjective/Objective:                  Pt admitted from [facility] with renal failure. Anticipate discharge back to facility at discharge. Pt functions out of a wheelchair at the facility.  Action/Plan: CSW is aware and will arrange discharge when medically stable.  Expected Discharge Date:  04/28/15               Expected Discharge Plan:  Assisted Living / Rest Home  In-House Referral:  Clinical Social Work  Discharge planning Services  CM Consult  Post Acute Care Choice:  NA Choice offered to:  NA  DME Arranged:    DME Agency:     HH Arranged:    [place] Agency:     Status of Service:  Completed, signed off  Medicare Important Message Given:    Date Medicare IM Given:    Medicare IM give by:    Date Additional Medicare IM Given:    Additional Medicare Important Message give by:     If discussed at [place] of Stay Meetings, dates discussed:    Additional Comments:  Joylene Draft, RN 04/26/2015, 10:39 AM

## 2015-04-26 NOTE — Progress Notes (Signed)
Patient Demographics  [name], is a 81 y.o. female, DOB - 04-19-1913, [id number]  Admit date - 04/25/2015   Admitting Physician Albertine Patricia, MD  Outpatient Primary MD for the patient is [name], MD  LOS - 1   Chief Complaint  Patient presents with  . Weakness       Admission HPI/Brief narrative: 81 y.o. female, with past medical history of COPD, GERD, diabetes mellitus type 2, hypothyroidism, osteoarthritis, presents due to progressive weakness, and lethargy, recently treated with Bactrim for UTI as an outpatient, had fever 100.8 on admission, acute renal failure creatinine 2.8.  Subjective:   [name] today is more awake this a.m. , denies any chest pain, dyspnea , nausea or vomiting . Assessment & Plan    Active Problems:   Essential hypertension   COPD (chronic obstructive pulmonary disease) (HCC)   PUD (peptic ulcer disease)   Acute encephalopathy   Protein-calorie malnutrition, severe (HCC)   Dehydration   Dementia   Acute renal failure (ARF) (HCC)  Acute renal failure -  most likely related to dehydration and volume depletion, as well possibly Bactrim contributing to it. - Continue to improve with IV fluids , creatinine 2.8>2 today. - Continue to monitor for urinary retention.  Acute encephalopathy//fever - improving mental status today , continue with broad-spectrum IV antibiotic pending results on septic workup . - Check CT head without contrast for further evaluation  - Blood cultures no growth to date   History of COPD/asthma - on when necessary nebs,  Theophylline, Singulair .  Protein calorie malnutrition - Resume on supplements when able to take oral intake  Hypothyroidism - Continue with Synthroid  Hyperkalemia - Resolved  GERD/PUD - Continue with PPI  Code Status: Full code, as Education officer, museum notified by patient's  legal guardian   Family Communication: none at bedside  Disposition Plan: back to SNF when stable    Procedures  none   Consults   none   Medications  Scheduled Meds: . heparin  5,000 Units Subcutaneous 3 times per day  . levETIRAcetam  250 mg Oral QHS  . levothyroxine  25 mcg Oral QAC breakfast  . piperacillin-tazobactam (ZOSYN) IVPB  2.25 g Intravenous 3 times per day  . sodium chloride  3 mL Intravenous Q12H  . theophylline  400 mg Oral Daily  . [START ON 04/27/2015] vancomycin  500 mg Intravenous Q48H   Continuous Infusions: . sodium chloride 75 mL/hr at 04/25/15 1045  . sodium chloride 75 mL/hr at 04/25/15 1204   PRN Meds:.acetaminophen **OR** acetaminophen, albuterol  DVT Prophylaxis   Heparin -   Lab Results  Component Value Date   PLT 180 04/26/2015    Antibiotics   Anti-infectives    Start     Dose/Rate Route Frequency Ordered Stop   04/27/15 1200  vancomycin (VANCOCIN) 500 mg in sodium chloride 0.9 % 100 mL IVPB     500 mg 100 mL/hr over 60 Minutes Intravenous Every 48 hours 04/25/15 1126     04/25/15 1400  piperacillin-tazobactam (ZOSYN) IVPB 2.25 g  Status:  Discontinued     2.25 g 100 mL/hr over 30 Minutes Intravenous 3 times per day 04/25/15 1127 04/25/15 1141   04/25/15 1200  vancomycin (  VANCOCIN) IVPB 1000 mg/200 mL premix     1,000 mg 200 mL/hr over 60 Minutes Intravenous  Once 04/25/15 1126 04/25/15 1304   04/25/15 1200  piperacillin-tazobactam (ZOSYN) 2.25 g in dextrose 5 % 50 mL IVPB     2.25 g 100 mL/hr over 30 Minutes Intravenous 3 times per day 04/25/15 1141            Objective:   Filed Vitals:   04/25/15 1422 04/25/15 2236 04/26/15 0700 04/26/15 1052  BP: 102/72 145/57 138/62   Pulse: 96 100 88   Temp:  98.9 F (37.2 C) 98.5 F (36.9 C)   TempSrc:  Oral Oral   Resp: 20 20 20    Height:      Weight:      SpO2: 95% 96% 97% 94%    Wt Readings from Last 3 Encounters:  04/25/15 47.6 kg (104 lb 15 oz)  04/14/15 49.896 kg  (110 lb)  11/16/14 49.896 kg (110 lb)     Intake/Output Summary (Last 24 hours) at 04/26/15 1137 Last data filed at 04/25/15 1700  Gross per 24 hour  Intake    240 ml  Output      0 ml  Net    240 ml     Physical Exam  More awake and communicative today , Supple Neck,No JVD, Symmetrical Chest wall movement, fair air movement bilaterally, CTAB, no wheezing, no use of accessory muscle  RRR,No Gallops,Rubs or new Murmurs, No Parasternal Heave +ve B.Sounds, Abd Soft, No tenderness, No organomegaly appriciated, No rebound - guarding or rigidity. No Cyanosis, Clubbing or edema, No new Rash or bruise    Data Review   Micro Results Recent Results (from the past 240 hour(s))  Urine culture     Status: None   Collection Time: 04/25/15  9:15 AM  Result Value Ref Range Status   Specimen Description URINE, CATHETERIZED  Final   Special Requests NONE  Final   Culture   Final    NO GROWTH 1 DAY Performed at [facility]    Report Status 04/26/2015 FINAL  Final  Culture, blood (routine x 2)     Status: None (Preliminary result)   Collection Time: 04/25/15 11:13 AM  Result Value Ref Range Status   Specimen Description BLOOD RIGHT FOREARM  Final   Special Requests BOTTLES DRAWN AEROBIC ONLY 6CC  Final   Culture NO GROWTH < 24 HOURS  Final   Report Status PENDING  Incomplete  Culture, blood (routine x 2)     Status: None (Preliminary result)   Collection Time: 04/25/15 12:45 PM  Result Value Ref Range Status   Specimen Description BLOOD LEFT HAND  Final   Special Requests BOTTLES DRAWN AEROBIC ONLY 4CC  Final   Culture NO GROWTH < 24 HOURS  Final   Report Status PENDING  Incomplete    Radiology Reports Dg Forearm Left  04/14/2015  CLINICAL DATA:  Status post fall out of wheelchair, with left forearm pain. Initial encounter. EXAM: LEFT FOREARM - 2 VIEW COMPARISON:  Left elbow radiographs performed 11/12/2014 FINDINGS: There is no evidence of fracture or dislocation. The  radius and ulna appear intact. The elbow joint is grossly unremarkable. No elbow joint effusion is identified. The carpal rows appear grossly intact, aside from mild degenerative change at the radial aspect of the carpal rows. There is calcification of the triangular fibrocartilage. IMPRESSION: No evidence of acute fracture or dislocation. Electronically Signed   By: Francoise Schaumann.D.  On: 04/14/2015 06:35   Ct Head Wo Contrast  04/14/2015  CLINICAL DATA:  Status post fall forward out of wheelchair, hitting forehead on floor. Concern for head or cervical spine injury. Initial encounter. EXAM: CT HEAD WITHOUT CONTRAST CT CERVICAL SPINE WITHOUT CONTRAST TECHNIQUE: Multidetector CT imaging of the head and cervical spine was performed following the standard protocol without intravenous contrast. Multiplanar CT image reconstructions of the cervical spine were also generated. COMPARISON:  CT of the head and cervical spine performed 11/12/2014 FINDINGS: CT HEAD FINDINGS There is no evidence of acute infarction, mass lesion, or intra- or extra-axial hemorrhage on CT. Prominence of the ventricles and sulci reflects mild cortical volume loss. Scattered periventricular white matter change likely reflects small vessel ischemic microangiopathy. Mild chronic ischemic change is noted at the external capsule bilaterally. Mild cerebellar atrophy is noted. The brainstem and fourth ventricle are within normal limits. The cerebral hemispheres demonstrate grossly normal gray-white differentiation. No mass effect or midline shift is seen. There is no evidence of fracture; visualized osseous structures are unremarkable in appearance. The orbits are within normal limits. The paranasal sinuses and mastoid air cells are well-aerated. No significant soft tissue abnormalities are seen. CT CERVICAL SPINE FINDINGS There is no evidence of fracture or subluxation. Diffuse degenerative change is noted about the dens. The patient is status  post anterior cervical spinal fusion at C4-C7, with associated degenerative change. Underlying facet disease is noted. Vertebral bodies demonstrate normal height and alignment. Prevertebral soft tissues are within normal limits. The thyroid gland is unremarkable in appearance. Minimal scarring is noted at the lung apices. No significant soft tissue abnormalities are seen. IMPRESSION: 1. No evidence of traumatic intracranial injury or fracture. 2. No evidence of fracture or subluxation along the cervical spine. 3. Mild cortical volume loss and scattered small vessel ischemic microangiopathy. Mild chronic ischemic change at the external capsule bilaterally. 4. Degenerative change along the cervical spine, status post anterior cervical spinal fusion at C4-C7. 5. Minimal scarring at the lung apices. Electronically Signed   By: Garald Balding M.D.   On: 04/14/2015 06:45   Ct Cervical Spine Wo Contrast  04/14/2015  CLINICAL DATA:  Status post fall forward out of wheelchair, hitting forehead on floor. Concern for head or cervical spine injury. Initial encounter. EXAM: CT HEAD WITHOUT CONTRAST CT CERVICAL SPINE WITHOUT CONTRAST TECHNIQUE: Multidetector CT imaging of the head and cervical spine was performed following the standard protocol without intravenous contrast. Multiplanar CT image reconstructions of the cervical spine were also generated. COMPARISON:  CT of the head and cervical spine performed 11/12/2014 FINDINGS: CT HEAD FINDINGS There is no evidence of acute infarction, mass lesion, or intra- or extra-axial hemorrhage on CT. Prominence of the ventricles and sulci reflects mild cortical volume loss. Scattered periventricular white matter change likely reflects small vessel ischemic microangiopathy. Mild chronic ischemic change is noted at the external capsule bilaterally. Mild cerebellar atrophy is noted. The brainstem and fourth ventricle are within normal limits. The cerebral hemispheres demonstrate grossly  normal gray-white differentiation. No mass effect or midline shift is seen. There is no evidence of fracture; visualized osseous structures are unremarkable in appearance. The orbits are within normal limits. The paranasal sinuses and mastoid air cells are well-aerated. No significant soft tissue abnormalities are seen. CT CERVICAL SPINE FINDINGS There is no evidence of fracture or subluxation. Diffuse degenerative change is noted about the dens. The patient is status post anterior cervical spinal fusion at C4-C7, with associated degenerative change. Underlying facet disease is  noted. Vertebral bodies demonstrate normal height and alignment. Prevertebral soft tissues are within normal limits. The thyroid gland is unremarkable in appearance. Minimal scarring is noted at the lung apices. No significant soft tissue abnormalities are seen. IMPRESSION: 1. No evidence of traumatic intracranial injury or fracture. 2. No evidence of fracture or subluxation along the cervical spine. 3. Mild cortical volume loss and scattered small vessel ischemic microangiopathy. Mild chronic ischemic change at the external capsule bilaterally. 4. Degenerative change along the cervical spine, status post anterior cervical spinal fusion at C4-C7. 5. Minimal scarring at the lung apices. Electronically Signed   By: Garald Balding M.D.   On: 04/14/2015 06:45   Dg Chest Portable 1 View  04/25/2015  CLINICAL DATA:  Fever.  Asthma.  COPD. EXAM: PORTABLE CHEST 1 VIEW COMPARISON:  11/16/2014. FINDINGS: Heart size upper limits normal. Thoracic atherosclerosis. There could be a small LEFT pleural effusion. Severe degenerative change LEFT shoulder. No infiltrates or failure. IMPRESSION: There may be a small LEFT effusion but there is no significant pulmonary opacity or failure. Electronically Signed   By: Staci Righter M.D.   On: 04/25/2015 09:20   Dg Shoulder Left  04/14/2015  CLINICAL DATA:  Status post fall out of wheelchair, with left  shoulder pain. Initial encounter. EXAM: LEFT SHOULDER - 2+ VIEW COMPARISON:  None. FINDINGS: There is no evidence of fracture or dislocation. The left humeral head is seated within the glenoid fossa. Degenerative change is noted at the left acromioclavicular joint. There is chronic superior subluxation of the left humeral head. No significant soft tissue abnormalities are seen. Cervical spinal fusion hardware is noted. The visualized portions of the left lung are clear. IMPRESSION: No evidence of fracture or dislocation. Electronically Signed   By: Garald Balding M.D.   On: 04/14/2015 06:37   Dg Hip Unilat  With Pelvis 2-3 Views Right  04/14/2015  CLINICAL DATA:  Status post fall forward out of wheelchair. Right hip pain. Initial encounter. EXAM: DG HIP (WITH OR WITHOUT PELVIS) 2-3V RIGHT COMPARISON:  Right hip radiographs performed 08/10/2014 FINDINGS: There is no evidence of fracture or dislocation. Both femoral heads are seated normally within their respective acetabula. The proximal right femur appears intact. Mild degenerative change is noted along the lower lumbar spine. Sclerotic change is seen at the sacroiliac joints. The visualized bowel gas pattern is grossly unremarkable in appearance. Scattered phleboliths are noted within the pelvis. IMPRESSION: No evidence of fracture or dislocation. Electronically Signed   By: Garald Balding M.D.   On: 04/14/2015 06:34     CBC  Recent Labs Lab 04/25/15 0907 04/26/15 0615  WBC 8.7 7.9  HGB 14.4 12.5  HCT 41.3 37.1  PLT 196 180  MCV 94.7 95.1  MCH 33.0 32.1  MCHC 34.9 33.7  RDW 15.5 15.6*  LYMPHSABS 1.5  --   MONOABS 0.3  --   EOSABS 0.2  --   BASOSABS 0.0  --     Chemistries   Recent Labs Lab 04/25/15 0907 04/26/15 0615  NA 135 137  K 5.7* 5.0  CL 102 108  CO2 24 21*  GLUCOSE 136* 99  BUN 70* 50*  CREATININE 2.86* 2.04*  CALCIUM 8.9 8.1*  AST 44*  --   ALT 20  --   ALKPHOS 57  --   BILITOT 0.5  --     ------------------------------------------------------------------------------------------------------------------ estimated creatinine clearance is 11 mL/min (by C-G formula based on Cr of 2.04). ------------------------------------------------------------------------------------------------------------------ No results for input(s): HGBA1C in the  last 72 hours. ------------------------------------------------------------------------------------------------------------------ No results for input(s): CHOL, HDL, LDLCALC, TRIG, CHOLHDL, LDLDIRECT in the last 72 hours. ------------------------------------------------------------------------------------------------------------------ No results for input(s): TSH, T4TOTAL, T3FREE, THYROIDAB in the last 72 hours.  Invalid input(s): FREET3 ------------------------------------------------------------------------------------------------------------------ No results for input(s): VITAMINB12, FOLATE, FERRITIN, TIBC, IRON, RETICCTPCT in the last 72 hours.  Coagulation profile No results for input(s): INR, PROTIME in the last 168 hours.  No results for input(s): DDIMER in the last 72 hours.  Cardiac Enzymes  Recent Labs Lab 04/25/15 0907  TROPONINI 0.06*   ------------------------------------------------------------------------------------------------------------------ Invalid input(s): POCBNP     Time Spent in minutes   30 minutes   Crystall Donaldson M.D on 04/26/2015 at 11:37 AM  Between 7am to 7pm - Pager - [phone]  After 7pm go to [email or web address] - password [facility]  [facility]   Office  [phone]

## 2015-04-26 NOTE — Progress Notes (Signed)
Bladder scan performed,patient has 364 ml of urine in bladder,Dr Elgergawany notified..Will continue to monitor patient.

## 2015-04-26 NOTE — Clinical Social Work Note (Signed)
Clinical Social Work Assessment  Patient Details  Name: [name] MRN: [id number] Date of Birth: [birth year]  Date of referral:  04/26/15               Reason for consult:  Discharge Planning                Permission sought to share information with:    Permission granted to share information::     Name::     [name]::     Relationship::  Legal guardian  Contact Information:  [phone]  Housing/Transportation Living arrangements for the past 2 months:  [name] of Information:  Facility, Other (Comment Required) (legal guardian) Patient Interpreter Needed:  None Criminal Activity/Legal Involvement Pertinent to Current Situation/Hospitalization:  No - Comment as needed Significant Relationships:  Adult Children Lives with:  Facility Resident Do you feel safe going back to the place where you live?  Yes Need for family participation in patient care:  No (Coment)  Care giving concerns:  Pt is long term resident at [name].    Social Worker assessment / plan:  CSW spoke with pt's guardian, [name] at [place]. They became guardian in June 2015 and placed pt at [name]. Pt has a daughter, [name] who is involved and okay to have information. Per [name] at facility, pt fell several days ago and has declined since then. Pt admitted due to acute kidney injury. At baseline, pt ambulates with a walker and stand by assist, but sometimes uses a wheelchair as well. She requires assist with dressing, bathing, and toileting. Okay for return. [name] also requests return to [name]. No home health prior to admission.   Employment status:  Retired Nurse, adult PT Recommendations:  Not assessed at this time Information / Referral to community resources:  Other (Comment Required) (return to [name])  Patient/Family's Response to care:  Pt's guardian requests return to [name] when medically  stable.   Patient/Family's Understanding of and Emotional Response to Diagnosis, Current Treatment, and Prognosis:  Pt's guardian is aware of admission diagnosis and treatment plan. CSW discussed code status as pt is DNR per H&P. Legal guardian states pt is full code at this time. MD notified.   Emotional Assessment Appearance:  Appears stated age Attitude/Demeanor/Rapport:  Unable to Assess Affect (typically observed):  Unable to Assess Orientation:  Oriented to Self Alcohol / Substance use:  Not Applicable Psych involvement (Current and /or in the community):  No (Comment)  Discharge Needs  Concerns to be addressed:  Discharge Planning Concerns Readmission within the last 30 days:  No Current discharge risk:  None Barriers to Discharge:  Continued Medical Work up   [name], [name] 04/26/2015, 9:19 AM [phone]

## 2015-04-27 LAB — BASIC METABOLIC PANEL
ANION GAP: 8 (ref 5–15)
BUN: 47 mg/dL — AB (ref 6–20)
CALCIUM: 8 mg/dL — AB (ref 8.9–10.3)
CO2: 16 mmol/L — AB (ref 22–32)
CREATININE: 1.7 mg/dL — AB (ref 0.44–1.00)
Chloride: 113 mmol/L — ABNORMAL HIGH (ref 101–111)
GFR calc Af Amer: 27 mL/min — ABNORMAL LOW (ref >60)
GFR, EST NON AFRICAN AMERICAN: 24 mL/min — AB (ref >60)
GLUCOSE: 94 mg/dL (ref 65–99)
Potassium: 5.1 mmol/L (ref 3.5–5.1)
Sodium: 137 mmol/L (ref 135–145)

## 2015-04-27 LAB — CBC
HEMATOCRIT: 40 % (ref 36.0–46.0)
Hemoglobin: 13.6 g/dL (ref 12.0–15.0)
MCH: 32.5 pg (ref 26.0–34.0)
MCHC: 34 g/dL (ref 30.0–36.0)
MCV: 95.5 fL (ref 78.0–100.0)
PLATELETS: 125 10*3/uL — AB (ref 150–400)
RBC: 4.19 MIL/uL (ref 3.87–5.11)
RDW: 16.1 % — AB (ref 11.5–15.5)
WBC: 7.6 10*3/uL (ref 4.0–10.5)

## 2015-04-27 MED ORDER — LEVOFLOXACIN 250 MG PO TABS: 250.0000 mg | ORAL_TABLET | ORAL | Status: DC

## 2015-04-27 MED ORDER — ACETAMINOPHEN 325 MG PO TABS: 650.0000 mg | ORAL_TABLET | Freq: Four times a day (QID) | ORAL | Status: AC | PRN

## 2015-04-27 MED ORDER — BOOST PLUS PO LIQD
237.0000 mL | Freq: Three times a day (TID) | ORAL | Status: DC
Start: 2015-04-27 — End: 2015-07-11

## 2015-04-27 NOTE — Care Management Important Message (Signed)
Important Message  Patient Details  Name: KOOPER CALDARERA MRN: [id number] Date of Birth: 03/21/1913   Medicare Important Message Given:  Yes    Joylene Draft, RN 04/27/2015, 10:29 AM

## 2015-04-27 NOTE — Care Management Note (Signed)
Case Management Note  Patient Details  Name: PAETYNN FEURTADO MRN: [id number] Date of Birth: May 27, 1912  Subjective/Objective:                    Action/Plan:   Expected Discharge Date:  04/28/15               Expected Discharge Plan:  Assisted Living / Rest Home  In-House Referral:  Clinical Social Work  Discharge planning Services  CM Consult  Post Acute Care Choice:  Home Health Choice offered to:  [facility] POA / Guardian  DME Arranged:    DME Agency:     HH Arranged:  PT, Speech Therapy [place] Agency:  [facility]  Status of Service:  Completed, signed off  Medicare Important Message Given:  Yes Date Medicare IM Given:    Medicare IM give by:    Date Additional Medicare IM Given:    Additional Medicare Important Message give by:     If discussed at [place] of Stay Meetings, dates discussed:    Additional Comments: Pt discharged back to [facility] today with [facility] PT and ST. Manuela Schwartz with [facility] is aware of referral and will collect the pts information from the chart. [place] services to start within 48 hours of discharge. CSW to arrange discharge to facility. Christinia Gully [place], RN 04/27/2015, 12:32 PM

## 2015-04-27 NOTE — Clinical Social Work Note (Signed)
Pt d/c today back to [facility]. Facility aware and agreeable. Aware pt will require feeding assist and home health PT and speech. Netta Cedars, pt's guardian, notified by voicemail.  Facility to provide transport.   [name], [place]

## 2015-04-27 NOTE — NC FL2 (Signed)
[place] LEVEL OF CARE SCREENING TOOL     IDENTIFICATION  Patient Name: [name] Birthdate: Jul 13, 1912 Sex: female Admission Date (Current Location): 04/25/2015  [place] and [place] Number: [place] [facility] [id number] [employer] [place] and Address:  [place] [address] [place]      Provider Number: [id number]  Attending Physician Name and Address:  Albertine Patricia, MD  Relative Name and Phone Number:       Current Level of Care: Hospital Recommended Level of Care: [place] Prior Approval Number:    Date Approved/Denied:   PASRR Number: [id number]  Discharge Plan: Other (Comment) (ALF)    Current Diagnoses: Patient Active Problem List   Diagnosis Date Noted  . Acute renal failure (ARF) ([place]) 04/25/2015  . Diarrhea 07/26/2014  . Enteritis due to Clostridium difficile 07/26/2014  . Acute kidney injury ([place]) 07/26/2014  . Dementia 07/26/2014  . Nausea with vomiting 07/26/2014  . Dehydration 07/25/2014  . Unspecified cerebral artery occlusion with cerebral infarction 02/08/2014  . Acute encephalopathy 02/06/2014  . Protein-calorie malnutrition, severe ([place]) 02/06/2014  . Urinary retention 02/05/2014  . Cholangitis 02/01/2014  . Cholelithiasis with biliary obstruction 02/01/2014  . Biliary obstruction 02/01/2014  . MGUS (monoclonal gammopathy of unknown significance) 08/28/2013  . PUD (peptic ulcer disease) 08/15/2010  . Diabetes ([place]) 12/28/2009  . Essential hypertension 12/28/2009  . ASTHMA 12/28/2009  . COPD (chronic obstructive pulmonary disease) ([place]) 12/28/2009  . GERD 12/28/2009  . DIVERTICULOSIS, COLON 12/28/2009  . OSTEOARTHRITIS 12/28/2009  . PANCREATITIS, HX OF 12/28/2009    Orientation RESPIRATION BLADDER Height & Weight    Self  Normal Incontinent 5\' 3"  (160 cm) 104 lbs.  BEHAVIORAL SYMPTOMS/MOOD NEUROLOGICAL BOWEL NUTRITION STATUS  Other (Comment) (n/a)  (n/a) Incontinent  (Soft  diet)  AMBULATORY STATUS COMMUNICATION OF NEEDS Skin   Assist extensive Verbally Normal                       Personal Care Assistance Level of Assistance  Bathing, Feeding, Dressing Bathing Assistance: Maximum assistance Feeding assistance: Maximum assistance Dressing Assistance: Maximum assistance     Functional Limitations Info  Sight, Hearing, Speech Sight Info: Adequate Hearing Info: Adequate Speech Info: Adequate    SPECIAL CARE FACTORS FREQUENCY  PT (By licensed PT), Speech therapy     PT Frequency: home health       Speech Therapy Frequency: home health      Contractures      Additional Factors Info  Code Status, Allergies Code Status Info: Full code Allergies Info: No known allergies           Current Medications (04/27/2015):  This is the current hospital active medication list Current Facility-Administered Medications  Medication Dose Route Frequency Provider Last Rate Last Dose  . 0.9 %  sodium chloride infusion   Intravenous Continuous Albertine Patricia, MD 75 mL/hr at 04/27/15 0915    . 0.9 %  sodium chloride infusion   Intravenous Continuous Albertine Patricia, MD 75 mL/hr at 04/25/15 1204    . acetaminophen (TYLENOL) tablet 650 mg  650 mg Oral Q6H PRN Albertine Patricia, MD       Or  . acetaminophen (TYLENOL) suppository 650 mg  650 mg Rectal Q6H PRN Silver Huguenin Elgergawy, MD      . albuterol (PROVENTIL) (2.5 MG/3ML) 0.083% nebulizer solution 2.5 mg  2.5 mg Nebulization Q2H PRN Albertine Patricia, MD      . heparin  injection 5,000 Units  5,000 Units Subcutaneous 3 times per day Albertine Patricia, MD   5,000 Units at 04/27/15 0546  . levETIRAcetam (KEPPRA) tablet 250 mg  250 mg Oral QHS Albertine Patricia, MD   250 mg at 04/26/15 2257  . levothyroxine (SYNTHROID, LEVOTHROID) tablet 25 mcg  25 mcg Oral QAC breakfast Albertine Patricia, MD   25 mcg at 04/27/15 0902  . piperacillin-tazobactam (ZOSYN) 2.25 g in dextrose 5 % 50 mL IVPB  2.25 g  Intravenous 3 times per day Albertine Patricia, MD   2.25 g at 04/27/15 0546  . sodium chloride 0.9 % injection 3 mL  3 mL Intravenous Q12H Albertine Patricia, MD   3 mL at 04/27/15 0906  . theophylline (UNIPHYL) 400 MG 24 hr tablet 400 mg  400 mg Oral Daily Albertine Patricia, MD   400 mg at 04/27/15 0902     Discharge Medications: Medication List    STOP taking these medications       cephALEXin 500 MG capsule  Commonly known as: KEFLEX     ciprofloxacin 500 MG tablet  Commonly known as: CIPRO     ferrous sulfate 325 (65 FE) MG tablet     furosemide 20 MG tablet  Commonly known as: LASIX     griseofulvin 250 MG tablet  Commonly known as: GRIS-PEG     HYDROcodone-acetaminophen 5-325 MG tablet  Commonly known as: NORCO/VICODIN     sulfamethoxazole-trimethoprim 800-160 MG tablet  Commonly known as: BACTRIM DS,SEPTRA DS      TAKE these medications       acetaminophen 325 MG tablet  Commonly known as: TYLENOL  Take 2 tablets (650 mg total) by mouth every 6 (six) hours as needed for mild pain (or Fever >/= 101).     aspirin 81 MG tablet  Take 81 mg by mouth daily.     Calcium Citrate-Vitamin D 200-250 MG-UNIT Tabs  Take 1 tablet by mouth daily.     DAILY VITAMIN PO  Take 1 tablet by mouth daily.     EARWAX TREATMENT DROPS 6.5 % otic solution  Generic drug: carbamide peroxide  Place 5 drops into both ears 2 (two) times daily as needed (build up).     lactose free nutrition Liqd  Take 237 mLs by mouth 3 (three) times daily with meals.     levETIRAcetam 250 MG tablet  Commonly known as: KEPPRA  Take 1 tablet (250 mg total) by mouth daily.     levothyroxine 25 MCG tablet  Commonly known as: SYNTHROID, LEVOTHROID  Take 25 mcg by mouth daily before breakfast.     montelukast 10 MG tablet  Commonly known as: SINGULAIR  Take 10 mg by mouth every morning.     polyethylene  glycol packet  Commonly known as: MIRALAX / GLYCOLAX  Take 17 g by mouth daily as needed for mild constipation.     solifenacin 5 MG tablet  Commonly known as: VESICARE  Take 5 mg by mouth daily.     tamsulosin 0.4 MG Caps capsule  Commonly known as: FLOMAX  Take 1 capsule (0.4 mg total) by mouth daily.     theophylline 400 MG 24 hr tablet  Commonly known as: UNIPHYL  Take 400 mg by mouth daily.              Relevant Imaging Results:  Relevant Lab Results:   Additional Information Legal guardianNetta Cedars [phone] x [place], [place],  LCSW [phone]

## 2015-04-27 NOTE — Discharge Instructions (Signed)
Follow with Primary MD [name], MD in 7 days   Get CBC, CMP, checked  by Primary MD next visit.    Activity: As tolerated with Full fall precautions use walker/cane & assistance as needed   Disposition ALF   Diet: Soft diet, patient need feeding assistance with every meal, to be encouraged to drink liquids and boost to prevent dehydration, and tinea with aspiration precautions.  For Heart failure patients - Check your Weight same time everyday, if you gain over 2 pounds, or you develop in leg swelling, experience more shortness of breath or chest pain, call your Primary MD immediately. Follow Cardiac Low Salt Diet and 1.5 lit/day fluid restriction.   On your next visit with your primary care physician please Get Medicines reviewed and adjusted.   Please request your Prim.MD to go over all Hospital Tests and Procedure/Radiological results at the follow up, please get all Hospital records sent to your Prim MD by signing hospital release before you go home.   If you experience worsening of your admission symptoms, develop shortness of breath, life threatening emergency, suicidal or homicidal thoughts you must seek medical attention immediately by calling 911 or calling your MD immediately  if symptoms less severe.  You Must read complete instructions/literature along with all the possible adverse reactions/side effects for all the Medicines you take and that have been prescribed to you. Take any new Medicines after you have completely understood and accpet all the possible adverse reactions/side effects.   Do not drive, operating heavy machinery, perform activities at heights, swimming or participation in water activities or provide baby sitting services if your were admitted for syncope or siezures until you have seen by Primary MD or a Neurologist and advised to do so again.  Do not drive when taking Pain medications.    Do not take more than prescribed Pain, Sleep and  Anxiety Medications  Special Instructions: If you have smoked or chewed Tobacco  in the last 2 yrs please stop smoking, stop any regular Alcohol  and or any Recreational drug use.  Wear Seat belts while driving.   Please note  You were cared for by a hospitalist during your hospital stay. If you have any questions about your discharge medications or the care you received while you were in the hospital after you are discharged, you can call the unit and asked to speak with the hospitalist on call if the hospitalist that took care of you is not available. Once you are discharged, your primary care physician will handle any further medical issues. Please note that NO REFILLS for any discharge medications will be authorized once you are discharged, as it is imperative that you return to your primary care physician (or establish a relationship with a primary care physician if you do not have one) for your aftercare needs so that they can reassess your need for medications and monitor your lab values.

## 2015-04-27 NOTE — Discharge Summary (Signed)
[name], is a 81 y.o. female  DOB [birth year]  MRN [id number].  Admission date:  04/25/2015  Admitting Physician  Albertine Patricia, MD  Discharge Date:  04/27/2015   Primary MD  [name], MD  Recommendations for primary care physician for things to follow:  - check CBC, BMP in 3 days, to ensure creatinine continues to improve. - patient will be discharged on soft diet, will need feeding assistance with all meals, is to be encouraged to drink fluids to prevent dehydration, on aspiration precaution.   Admission Diagnosis  Hyperkalemia [E87.5] Renal insufficiency [N28.9] Fever, unspecified fever cause [R50.9]   Discharge Diagnosis  Hyperkalemia [E87.5] Renal insufficiency [N28.9] Fever, unspecified fever cause [R50.9]    Active Problems:   Essential hypertension   COPD (chronic obstructive pulmonary disease) (HCC)   PUD (peptic ulcer disease)   Acute encephalopathy   Protein-calorie malnutrition, severe (HCC)   Dehydration   Dementia   Acute renal failure (ARF) (HCC)      Past Medical History  Diagnosis Date  . Urinary retention   . Gastritis   . Esophageal ulcer   . Salmonella sepsis ([place])   . Degenerative disk disease   . Spinal stenosis   . Asthma   . COPD (chronic obstructive pulmonary disease) ([place])   . Diabetes mellitus, type 2 ([place])   . Diverticulosis of colon   . GERD (gastroesophageal reflux disease)   . Hypertension   . Osteoarthritis   . Hiatal hernia   . Pancreatitis   . S/P endoscopy 2009    Dr. Oneida Alar: gastritis  . Diverticulitis   . S/P endoscopy March 2012    Dr. Gala Romney: NSAID induced ulcer  . Hypothyroidism   . MGUS (monoclonal gammopathy of unknown significance)   . Stroke ([place])   . Dementia   . Renal disorder     Past Surgical History  Procedure Laterality Date  . Cervical fusion    . Cholecystectomy    . Ercp N/A 02/02/2014   Procedure: ENDOSCOPIC RETROGRADE CHOLANGIOPANCREATOGRAPHY (ERCP), STONE BASKET EXTRACTION;  Surgeon: Daneil Dolin, MD;  Location: [facility];  Service: Endoscopy;  Laterality: N/A;  . Sphincterotomy N/A 02/02/2014    Procedure: SPHINCTEROTOMY;  Surgeon: Daneil Dolin, MD;  Location: [facility];  Service: Endoscopy;  Laterality: N/A;  . Balloon dilation N/A 02/02/2014    Procedure: BALLOON DILATION;  Surgeon: Daneil Dolin, MD;  Location: [facility];  Service: Endoscopy;  Laterality: N/A;  . Biliary stent placement N/A 02/02/2014    Procedure: BILIARY STENT PLACEMENT;  Surgeon: Daneil Dolin, MD;  Location: [facility];  Service: Endoscopy;  Laterality: N/A;  . Spyglass cholangioscopy N/A 02/02/2014    Procedure: [id number] CHOLANGIOSCOPY;  Surgeon: Daneil Dolin, MD;  Location: [facility];  Service: Endoscopy;  Laterality: N/A;  . Esophagogastroduodenoscopy N/A 02/05/2014    Procedure: ESOPHAGOGASTRODUODENOSCOPY (EGD);  Surgeon: [name], MD;  Location: [facility];  Service: Endoscopy;  Laterality: N/A;       History of  present illness and  Hospital Course:     Kindly see H&P for history of present illness and admission details, please review complete Labs, Consult reports and Test reports for all details in brief  HPI  from the history and physical done on the day of admission 04/25/2015  [name] is a 81 y.o. female, with past medical history of COPD, GERD, diabetes mellitus type 2, hypothyroidism, osteoarthritis, presents due to progressive weakness, and lethargy, patient is a nursing home resident, with advanced dementia, ambulates occasionally with a walker, mainly on wheelchair, over the last few days patient been having progressive functional decline, worsening appetite, less verbal, refusing her medication, oral intake, has been on Bactrim to treat UTI, in ED workup was significant for acute renal failure with creatinine of 2.8, baseline is 1.2, as well fever 100.8, negative urinalysis,  chest x-ray with no evidence of pneumonia, patient had negative meningeal signs, daughter reports patient had vomiting before few days, does not recall if she had any diarrhea, hospitalist requested to admit.  Hospital Course  81 y.o. female, with past medical history of COPD, GERD, diabetes mellitus type 2, hypothyroidism, osteoarthritis, presents due to progressive weakness, and lethargy, recently treated with Bactrim for UTI as an outpatient, had fever 100.8 on admission, acute renal failure creatinine 2.8., Septic workup was negative including blood cultures, urinalysis, and chest x-ray, did empirically on broad-spectrum IV antibiotics.  Acute renal failure - most likely related to dehydration and volume depletion, as well possibly Bactrim contributing to it. - Continue to improve with IV fluids, creatinine 2.8>2 >1.7 at time of discharge, patient currently with improved oral intake, to encourage fluid intake at facility. - Continue to hold Lasix and Bactrim on discharge.  Acute encephalopathy//fever - improving mental status - Treated with broad-spectrum IV and hepatic vancomycin and Zosyn pending her septic workup, cultures remain negative, urinalysis with no white blood cells, chest x-ray with no evidence of pneumonia, no need for further antibiotics as an outpatient. - CT head with ischemic and atrophic changes, no evidence of acute findings  History of COPD/asthma - on when necessary nebs, Theophylline, Singulair .  Protein calorie malnutrition - Continue with boost  Hypothyroidism - Continue with Synthroid  Hyperkalemia - Resolved  GERD/PUD - Continue with PPI  Discharge Condition: Stable   Follow UP      Follow-up Information    Follow up with [name], MD. Schedule an appointment as soon as possible for a visit in 1 week.   Specialty:  Family Medicine   Why:  Posthospitalization follow-up   Contact information:   [address] [place]  [place] [address] [phone]         Discharge Instructions  and  Discharge Medications        Medication List    STOP taking these medications        cephALEXin 500 MG capsule  Commonly known as:  KEFLEX     ciprofloxacin 500 MG tablet  Commonly known as:  CIPRO     ferrous sulfate 325 (65 FE) MG tablet     furosemide 20 MG tablet  Commonly known as:  LASIX     griseofulvin 250 MG tablet  Commonly known as:  GRIS-PEG     HYDROcodone-acetaminophen 5-325 MG tablet  Commonly known as:  NORCO/VICODIN     sulfamethoxazole-trimethoprim 800-160 MG tablet  Commonly known as:  BACTRIM DS,SEPTRA DS      TAKE these medications        acetaminophen  325 MG tablet  Commonly known as:  TYLENOL  Take 2 tablets (650 mg total) by mouth every 6 (six) hours as needed for mild pain (or Fever >/= 101).     aspirin 81 MG tablet  Take 81 mg by mouth daily.     Calcium Citrate-Vitamin D 200-250 MG-UNIT Tabs  Take 1 tablet by mouth daily.     DAILY VITAMIN PO  Take 1 tablet by mouth daily.     EARWAX TREATMENT DROPS 6.5 % otic solution  Generic drug:  carbamide peroxide  Place 5 drops into both ears 2 (two) times daily as needed (build up).     lactose free nutrition Liqd  Take 237 mLs by mouth 3 (three) times daily with meals.     levETIRAcetam 250 MG tablet  Commonly known as:  KEPPRA  Take 1 tablet (250 mg total) by mouth daily.     levothyroxine 25 MCG tablet  Commonly known as:  SYNTHROID, LEVOTHROID  Take 25 mcg by mouth daily before breakfast.     montelukast 10 MG tablet  Commonly known as:  SINGULAIR  Take 10 mg by mouth every morning.     polyethylene glycol packet  Commonly known as:  MIRALAX / GLYCOLAX  Take 17 g by mouth daily as needed for mild constipation.     solifenacin 5 MG tablet  Commonly known as:  VESICARE  Take 5 mg by mouth daily.     tamsulosin 0.4 MG Caps capsule  Commonly known as:  FLOMAX  Take 1 capsule (0.4 mg total) by mouth daily.       theophylline 400 MG 24 hr tablet  Commonly known as:  UNIPHYL  Take 400 mg by mouth daily.          Diet and Activity recommendation: See Discharge Instructions above   Consults obtained -  none   Major procedures and Radiology Reports - PLEASE review detailed and final reports for all details, in brief -      Dg Forearm Left  04/14/2015  CLINICAL DATA:  Status post fall out of wheelchair, with left forearm pain. Initial encounter. EXAM: LEFT FOREARM - 2 VIEW COMPARISON:  Left elbow radiographs performed 11/12/2014 FINDINGS: There is no evidence of fracture or dislocation. The radius and ulna appear intact. The elbow joint is grossly unremarkable. No elbow joint effusion is identified. The carpal rows appear grossly intact, aside from mild degenerative change at the radial aspect of the carpal rows. There is calcification of the triangular fibrocartilage. IMPRESSION: No evidence of acute fracture or dislocation. Electronically Signed   By: Garald Balding M.D.   On: 04/14/2015 06:35   Ct Head Wo Contrast  04/26/2015  CLINICAL DATA:  Progressive weakness and lethargy EXAM: CT HEAD WITHOUT CONTRAST TECHNIQUE: Contiguous axial images were obtained from the base of the skull through the vertex without intravenous contrast. COMPARISON:  04/14/2015 FINDINGS: Bony calvarium is intact. Diffuse atrophic changes are noted. Scattered basal ganglia calcifications are seen. Chronic white matter ischemic change is seen. No findings to suggest acute hemorrhage, acute infarction or space-occupying mass lesion are noted. IMPRESSION: Chronic atrophic and ischemic changes.  No acute abnormality seen. Electronically Signed   By: Inez Catalina M.D.   On: 04/26/2015 13:37   Ct Head Wo Contrast  04/14/2015  CLINICAL DATA:  Status post fall forward out of wheelchair, hitting forehead on floor. Concern for head or cervical spine injury. Initial encounter. EXAM: CT HEAD WITHOUT CONTRAST CT CERVICAL SPINE  WITHOUT CONTRAST TECHNIQUE: Multidetector CT imaging of the head and cervical spine was performed following the standard protocol without intravenous contrast. Multiplanar CT image reconstructions of the cervical spine were also generated. COMPARISON:  CT of the head and cervical spine performed 11/12/2014 FINDINGS: CT HEAD FINDINGS There is no evidence of acute infarction, mass lesion, or intra- or extra-axial hemorrhage on CT. Prominence of the ventricles and sulci reflects mild cortical volume loss. Scattered periventricular white matter change likely reflects small vessel ischemic microangiopathy. Mild chronic ischemic change is noted at the external capsule bilaterally. Mild cerebellar atrophy is noted. The brainstem and fourth ventricle are within normal limits. The cerebral hemispheres demonstrate grossly normal gray-white differentiation. No mass effect or midline shift is seen. There is no evidence of fracture; visualized osseous structures are unremarkable in appearance. The orbits are within normal limits. The paranasal sinuses and mastoid air cells are well-aerated. No significant soft tissue abnormalities are seen. CT CERVICAL SPINE FINDINGS There is no evidence of fracture or subluxation. Diffuse degenerative change is noted about the dens. The patient is status post anterior cervical spinal fusion at C4-C7, with associated degenerative change. Underlying facet disease is noted. Vertebral bodies demonstrate normal height and alignment. Prevertebral soft tissues are within normal limits. The thyroid gland is unremarkable in appearance. Minimal scarring is noted at the lung apices. No significant soft tissue abnormalities are seen. IMPRESSION: 1. No evidence of traumatic intracranial injury or fracture. 2. No evidence of fracture or subluxation along the cervical spine. 3. Mild cortical volume loss and scattered small vessel ischemic microangiopathy. Mild chronic ischemic change at the external capsule  bilaterally. 4. Degenerative change along the cervical spine, status post anterior cervical spinal fusion at C4-C7. 5. Minimal scarring at the lung apices. Electronically Signed   By: Garald Balding M.D.   On: 04/14/2015 06:45   Ct Cervical Spine Wo Contrast  04/14/2015  CLINICAL DATA:  Status post fall forward out of wheelchair, hitting forehead on floor. Concern for head or cervical spine injury. Initial encounter. EXAM: CT HEAD WITHOUT CONTRAST CT CERVICAL SPINE WITHOUT CONTRAST TECHNIQUE: Multidetector CT imaging of the head and cervical spine was performed following the standard protocol without intravenous contrast. Multiplanar CT image reconstructions of the cervical spine were also generated. COMPARISON:  CT of the head and cervical spine performed 11/12/2014 FINDINGS: CT HEAD FINDINGS There is no evidence of acute infarction, mass lesion, or intra- or extra-axial hemorrhage on CT. Prominence of the ventricles and sulci reflects mild cortical volume loss. Scattered periventricular white matter change likely reflects small vessel ischemic microangiopathy. Mild chronic ischemic change is noted at the external capsule bilaterally. Mild cerebellar atrophy is noted. The brainstem and fourth ventricle are within normal limits. The cerebral hemispheres demonstrate grossly normal gray-white differentiation. No mass effect or midline shift is seen. There is no evidence of fracture; visualized osseous structures are unremarkable in appearance. The orbits are within normal limits. The paranasal sinuses and mastoid air cells are well-aerated. No significant soft tissue abnormalities are seen. CT CERVICAL SPINE FINDINGS There is no evidence of fracture or subluxation. Diffuse degenerative change is noted about the dens. The patient is status post anterior cervical spinal fusion at C4-C7, with associated degenerative change. Underlying facet disease is noted. Vertebral bodies demonstrate normal height and alignment.  Prevertebral soft tissues are within normal limits. The thyroid gland is unremarkable in appearance. Minimal scarring is noted at the lung apices. No significant soft tissue abnormalities are seen. IMPRESSION: 1. No evidence of traumatic  intracranial injury or fracture. 2. No evidence of fracture or subluxation along the cervical spine. 3. Mild cortical volume loss and scattered small vessel ischemic microangiopathy. Mild chronic ischemic change at the external capsule bilaterally. 4. Degenerative change along the cervical spine, status post anterior cervical spinal fusion at C4-C7. 5. Minimal scarring at the lung apices. Electronically Signed   By: Garald Balding M.D.   On: 04/14/2015 06:45   Dg Chest Portable 1 View  04/25/2015  CLINICAL DATA:  Fever.  Asthma.  COPD. EXAM: PORTABLE CHEST 1 VIEW COMPARISON:  11/16/2014. FINDINGS: Heart size upper limits normal. Thoracic atherosclerosis. There could be a small LEFT pleural effusion. Severe degenerative change LEFT shoulder. No infiltrates or failure. IMPRESSION: There may be a small LEFT effusion but there is no significant pulmonary opacity or failure. Electronically Signed   By: Staci Righter M.D.   On: 04/25/2015 09:20   Dg Shoulder Left  04/14/2015  CLINICAL DATA:  Status post fall out of wheelchair, with left shoulder pain. Initial encounter. EXAM: LEFT SHOULDER - 2+ VIEW COMPARISON:  None. FINDINGS: There is no evidence of fracture or dislocation. The left humeral head is seated within the glenoid fossa. Degenerative change is noted at the left acromioclavicular joint. There is chronic superior subluxation of the left humeral head. No significant soft tissue abnormalities are seen. Cervical spinal fusion hardware is noted. The visualized portions of the left lung are clear. IMPRESSION: No evidence of fracture or dislocation. Electronically Signed   By: Garald Balding M.D.   On: 04/14/2015 06:37   Dg Hip Unilat  With Pelvis 2-3 Views  Right  04/14/2015  CLINICAL DATA:  Status post fall forward out of wheelchair. Right hip pain. Initial encounter. EXAM: DG HIP (WITH OR WITHOUT PELVIS) 2-3V RIGHT COMPARISON:  Right hip radiographs performed 08/10/2014 FINDINGS: There is no evidence of fracture or dislocation. Both femoral heads are seated normally within their respective acetabula. The proximal right femur appears intact. Mild degenerative change is noted along the lower lumbar spine. Sclerotic change is seen at the sacroiliac joints. The visualized bowel gas pattern is grossly unremarkable in appearance. Scattered phleboliths are noted within the pelvis. IMPRESSION: No evidence of fracture or dislocation. Electronically Signed   By: Garald Balding M.D.   On: 04/14/2015 06:34    Micro Results    Recent Results (from the past 240 hour(s))  Urine culture     Status: None   Collection Time: 04/25/15  9:15 AM  Result Value Ref Range Status   Specimen Description URINE, CATHETERIZED  Final   Special Requests NONE  Final   Culture   Final    NO GROWTH 1 DAY Performed at [facility]    Report Status 04/26/2015 FINAL  Final  Culture, blood (routine x 2)     Status: None (Preliminary result)   Collection Time: 04/25/15 11:13 AM  Result Value Ref Range Status   Specimen Description BLOOD RIGHT FOREARM  Final   Special Requests BOTTLES DRAWN AEROBIC ONLY 6CC  Final   Culture NO GROWTH < 24 HOURS  Final   Report Status PENDING  Incomplete  Culture, blood (routine x 2)     Status: None (Preliminary result)   Collection Time: 04/25/15 12:45 PM  Result Value Ref Range Status   Specimen Description BLOOD LEFT HAND  Final   Special Requests BOTTLES DRAWN AEROBIC ONLY 4CC  Final   Culture NO GROWTH < 24 HOURS  Final   Report Status PENDING  Incomplete       Today   Subjective:   [name] today is more awake, and as any chest pain, shortness of breath.  Objective:   Blood pressure 101/50, pulse 99,  temperature 97.7 F (36.5 C), temperature source Oral, resp. rate 18, height 5\' 3"  (1.6 m), weight 47.6 kg (104 lb 15 oz), SpO2 93 %.   Intake/Output Summary (Last 24 hours) at 04/27/15 1233 Last data filed at 04/27/15 1217  Gross per 24 hour  Intake 3480.5 ml  Output      0 ml  Net 3480.5 ml    Exam  Data Review   CBC w Diff:  Lab Results  Component Value Date   WBC 7.6 04/27/2015   HGB 13.6 04/27/2015   HCT 40.0 04/27/2015   PLT 125* 04/27/2015   LYMPHOPCT 17 04/25/2015   MONOPCT 4 04/25/2015   EOSPCT 2 04/25/2015   BASOPCT 0 04/25/2015    CMP:  Lab Results  Component Value Date   NA 137 04/27/2015   K 5.1 04/27/2015   CL 113* 04/27/2015   CO2 16* 04/27/2015   BUN 47* 04/27/2015   CREATININE 1.70* 04/27/2015   PROT 7.9 04/25/2015   ALBUMIN 3.6 04/25/2015   BILITOT 0.5 04/25/2015   ALKPHOS 57 04/25/2015   AST 44* 04/25/2015   ALT 20 04/25/2015  .   Total Time in preparing paper work, data evaluation and todays exam - 35 minutes  Dellas Guard M.D on 04/27/2015 at 12:33 PM  [facility]   Office  [phone]

## 2015-04-28 LAB — THEOPHYLLINE LEVEL: Theophylline Lvl: 7.3 ug/mL — ABNORMAL LOW (ref 10.0–20.0)

## 2015-04-30 DIAGNOSIS — M6281 Muscle weakness (generalized): Secondary | ICD-10-CM | POA: Diagnosis not present

## 2015-04-30 DIAGNOSIS — Z792 Long term (current) use of antibiotics: Secondary | ICD-10-CM | POA: Diagnosis not present

## 2015-04-30 DIAGNOSIS — E875 Hyperkalemia: Secondary | ICD-10-CM | POA: Diagnosis not present

## 2015-04-30 DIAGNOSIS — M1990 Unspecified osteoarthritis, unspecified site: Secondary | ICD-10-CM | POA: Diagnosis not present

## 2015-04-30 DIAGNOSIS — F0390 Unspecified dementia without behavioral disturbance: Secondary | ICD-10-CM | POA: Diagnosis not present

## 2015-04-30 DIAGNOSIS — R32 Unspecified urinary incontinence: Secondary | ICD-10-CM | POA: Diagnosis not present

## 2015-04-30 DIAGNOSIS — J449 Chronic obstructive pulmonary disease, unspecified: Secondary | ICD-10-CM | POA: Diagnosis not present

## 2015-04-30 DIAGNOSIS — Z79891 Long term (current) use of opiate analgesic: Secondary | ICD-10-CM | POA: Diagnosis not present

## 2015-04-30 LAB — CULTURE, BLOOD (ROUTINE X 2)
CULTURE: NO GROWTH
CULTURE: NO GROWTH

## 2015-05-03 DIAGNOSIS — M6281 Muscle weakness (generalized): Secondary | ICD-10-CM | POA: Diagnosis not present

## 2015-05-03 DIAGNOSIS — M1990 Unspecified osteoarthritis, unspecified site: Secondary | ICD-10-CM | POA: Diagnosis not present

## 2015-05-03 DIAGNOSIS — J449 Chronic obstructive pulmonary disease, unspecified: Secondary | ICD-10-CM | POA: Diagnosis not present

## 2015-05-03 DIAGNOSIS — E875 Hyperkalemia: Secondary | ICD-10-CM | POA: Diagnosis not present

## 2015-05-03 DIAGNOSIS — Z79891 Long term (current) use of opiate analgesic: Secondary | ICD-10-CM | POA: Diagnosis not present

## 2015-05-03 DIAGNOSIS — Z792 Long term (current) use of antibiotics: Secondary | ICD-10-CM | POA: Diagnosis not present

## 2015-05-03 DIAGNOSIS — F0390 Unspecified dementia without behavioral disturbance: Secondary | ICD-10-CM | POA: Diagnosis not present

## 2015-05-03 DIAGNOSIS — R32 Unspecified urinary incontinence: Secondary | ICD-10-CM | POA: Diagnosis not present

## 2015-05-04 DIAGNOSIS — Z681 Body mass index (BMI) 19 or less, adult: Secondary | ICD-10-CM | POA: Diagnosis not present

## 2015-05-04 DIAGNOSIS — R0989 Other specified symptoms and signs involving the circulatory and respiratory systems: Secondary | ICD-10-CM | POA: Diagnosis not present

## 2015-05-04 DIAGNOSIS — R5383 Other fatigue: Secondary | ICD-10-CM | POA: Diagnosis not present

## 2015-05-04 DIAGNOSIS — R109 Unspecified abdominal pain: Secondary | ICD-10-CM | POA: Diagnosis not present

## 2015-05-07 ENCOUNTER — Other Ambulatory Visit (HOSPITAL_COMMUNITY): Payer: Self-pay | Admitting: Family Medicine

## 2015-05-07 ENCOUNTER — Ambulatory Visit (HOSPITAL_COMMUNITY)
Admission: RE | Admit: 2015-05-07 | Discharge: 2015-05-07 | Disposition: A | Discharge status: Home / Self Care | Payer: Medicare Other | Source: Ambulatory Visit | Attending: Family Medicine | Admitting: Family Medicine

## 2015-05-07 DIAGNOSIS — R5383 Other fatigue: Secondary | ICD-10-CM | POA: Insufficient documentation

## 2015-05-07 DIAGNOSIS — J449 Chronic obstructive pulmonary disease, unspecified: Secondary | ICD-10-CM | POA: Insufficient documentation

## 2015-05-08 DIAGNOSIS — J449 Chronic obstructive pulmonary disease, unspecified: Secondary | ICD-10-CM | POA: Diagnosis not present

## 2015-05-08 DIAGNOSIS — R32 Unspecified urinary incontinence: Secondary | ICD-10-CM | POA: Diagnosis not present

## 2015-05-08 DIAGNOSIS — M6281 Muscle weakness (generalized): Secondary | ICD-10-CM | POA: Diagnosis not present

## 2015-05-08 DIAGNOSIS — E875 Hyperkalemia: Secondary | ICD-10-CM | POA: Diagnosis not present

## 2015-05-08 DIAGNOSIS — Z79891 Long term (current) use of opiate analgesic: Secondary | ICD-10-CM | POA: Diagnosis not present

## 2015-05-08 DIAGNOSIS — M1990 Unspecified osteoarthritis, unspecified site: Secondary | ICD-10-CM | POA: Diagnosis not present

## 2015-05-08 DIAGNOSIS — F0390 Unspecified dementia without behavioral disturbance: Secondary | ICD-10-CM | POA: Diagnosis not present

## 2015-05-08 DIAGNOSIS — Z792 Long term (current) use of antibiotics: Secondary | ICD-10-CM | POA: Diagnosis not present

## 2015-05-09 DIAGNOSIS — M6281 Muscle weakness (generalized): Secondary | ICD-10-CM | POA: Diagnosis not present

## 2015-05-09 DIAGNOSIS — J449 Chronic obstructive pulmonary disease, unspecified: Secondary | ICD-10-CM | POA: Diagnosis not present

## 2015-05-09 DIAGNOSIS — M1990 Unspecified osteoarthritis, unspecified site: Secondary | ICD-10-CM | POA: Diagnosis not present

## 2015-05-09 DIAGNOSIS — E875 Hyperkalemia: Secondary | ICD-10-CM | POA: Diagnosis not present

## 2015-05-09 DIAGNOSIS — F0390 Unspecified dementia without behavioral disturbance: Secondary | ICD-10-CM | POA: Diagnosis not present

## 2015-05-09 DIAGNOSIS — R32 Unspecified urinary incontinence: Secondary | ICD-10-CM | POA: Diagnosis not present

## 2015-05-09 DIAGNOSIS — Z792 Long term (current) use of antibiotics: Secondary | ICD-10-CM | POA: Diagnosis not present

## 2015-05-09 DIAGNOSIS — Z79891 Long term (current) use of opiate analgesic: Secondary | ICD-10-CM | POA: Diagnosis not present

## 2015-05-15 DIAGNOSIS — M6281 Muscle weakness (generalized): Secondary | ICD-10-CM | POA: Diagnosis not present

## 2015-05-15 DIAGNOSIS — E875 Hyperkalemia: Secondary | ICD-10-CM | POA: Diagnosis not present

## 2015-05-15 DIAGNOSIS — Z79891 Long term (current) use of opiate analgesic: Secondary | ICD-10-CM | POA: Diagnosis not present

## 2015-05-15 DIAGNOSIS — Z792 Long term (current) use of antibiotics: Secondary | ICD-10-CM | POA: Diagnosis not present

## 2015-05-15 DIAGNOSIS — F0390 Unspecified dementia without behavioral disturbance: Secondary | ICD-10-CM | POA: Diagnosis not present

## 2015-05-15 DIAGNOSIS — J449 Chronic obstructive pulmonary disease, unspecified: Secondary | ICD-10-CM | POA: Diagnosis not present

## 2015-05-15 DIAGNOSIS — R32 Unspecified urinary incontinence: Secondary | ICD-10-CM | POA: Diagnosis not present

## 2015-05-15 DIAGNOSIS — M1990 Unspecified osteoarthritis, unspecified site: Secondary | ICD-10-CM | POA: Diagnosis not present

## 2015-05-17 DIAGNOSIS — F0390 Unspecified dementia without behavioral disturbance: Secondary | ICD-10-CM | POA: Diagnosis not present

## 2015-05-17 DIAGNOSIS — E875 Hyperkalemia: Secondary | ICD-10-CM | POA: Diagnosis not present

## 2015-05-17 DIAGNOSIS — Z79891 Long term (current) use of opiate analgesic: Secondary | ICD-10-CM | POA: Diagnosis not present

## 2015-05-17 DIAGNOSIS — R32 Unspecified urinary incontinence: Secondary | ICD-10-CM | POA: Diagnosis not present

## 2015-05-17 DIAGNOSIS — J449 Chronic obstructive pulmonary disease, unspecified: Secondary | ICD-10-CM | POA: Diagnosis not present

## 2015-05-17 DIAGNOSIS — M6281 Muscle weakness (generalized): Secondary | ICD-10-CM | POA: Diagnosis not present

## 2015-05-17 DIAGNOSIS — M1990 Unspecified osteoarthritis, unspecified site: Secondary | ICD-10-CM | POA: Diagnosis not present

## 2015-05-17 DIAGNOSIS — Z792 Long term (current) use of antibiotics: Secondary | ICD-10-CM | POA: Diagnosis not present

## 2015-05-29 ENCOUNTER — Encounter (HOSPITAL_COMMUNITY): Payer: Self-pay | Admitting: Emergency Medicine

## 2015-05-29 ENCOUNTER — Emergency Department (HOSPITAL_COMMUNITY)
Admission: EM | Admit: 2015-05-29 | Discharge: 2015-05-29 | Disposition: A | Discharge status: Home / Self Care | Payer: Medicare Other | Attending: Emergency Medicine | Admitting: Emergency Medicine

## 2015-05-29 DIAGNOSIS — Z79899 Other long term (current) drug therapy: Secondary | ICD-10-CM | POA: Diagnosis not present

## 2015-05-29 DIAGNOSIS — R404 Transient alteration of awareness: Secondary | ICD-10-CM

## 2015-05-29 DIAGNOSIS — Z7982 Long term (current) use of aspirin: Secondary | ICD-10-CM | POA: Diagnosis not present

## 2015-05-29 DIAGNOSIS — Z8719 Personal history of other diseases of the digestive system: Secondary | ICD-10-CM | POA: Insufficient documentation

## 2015-05-29 DIAGNOSIS — Z8673 Personal history of transient ischemic attack (TIA), and cerebral infarction without residual deficits: Secondary | ICD-10-CM | POA: Insufficient documentation

## 2015-05-29 DIAGNOSIS — F0390 Unspecified dementia without behavioral disturbance: Secondary | ICD-10-CM | POA: Diagnosis not present

## 2015-05-29 DIAGNOSIS — I10 Essential (primary) hypertension: Secondary | ICD-10-CM | POA: Diagnosis not present

## 2015-05-29 DIAGNOSIS — R011 Cardiac murmur, unspecified: Secondary | ICD-10-CM | POA: Diagnosis not present

## 2015-05-29 DIAGNOSIS — Z8619 Personal history of other infectious and parasitic diseases: Secondary | ICD-10-CM | POA: Insufficient documentation

## 2015-05-29 DIAGNOSIS — M1990 Unspecified osteoarthritis, unspecified site: Secondary | ICD-10-CM | POA: Diagnosis not present

## 2015-05-29 DIAGNOSIS — J449 Chronic obstructive pulmonary disease, unspecified: Secondary | ICD-10-CM | POA: Diagnosis not present

## 2015-05-29 DIAGNOSIS — Z87448 Personal history of other diseases of urinary system: Secondary | ICD-10-CM | POA: Insufficient documentation

## 2015-05-29 DIAGNOSIS — E039 Hypothyroidism, unspecified: Secondary | ICD-10-CM | POA: Diagnosis not present

## 2015-05-29 DIAGNOSIS — R4182 Altered mental status, unspecified: Secondary | ICD-10-CM | POA: Diagnosis present

## 2015-05-29 LAB — COMPREHENSIVE METABOLIC PANEL
ALK PHOS: 67 U/L (ref 38–126)
ALT: 14 U/L (ref 14–54)
AST: 26 U/L (ref 15–41)
Albumin: 3.6 g/dL (ref 3.5–5.0)
Anion gap: 8 (ref 5–15)
BUN: 40 mg/dL — ABNORMAL HIGH (ref 6–20)
CALCIUM: 9.8 mg/dL (ref 8.9–10.3)
CO2: 29 mmol/L (ref 22–32)
CREATININE: 1.11 mg/dL — AB (ref 0.44–1.00)
Chloride: 105 mmol/L (ref 101–111)
GFR, EST AFRICAN AMERICAN: 46 mL/min — AB (ref >60)
GFR, EST NON AFRICAN AMERICAN: 39 mL/min — AB (ref >60)
Glucose, Bld: 112 mg/dL — ABNORMAL HIGH (ref 65–99)
Potassium: 4.6 mmol/L (ref 3.5–5.1)
SODIUM: 142 mmol/L (ref 135–145)
Total Bilirubin: 0.6 mg/dL (ref 0.3–1.2)
Total Protein: 7 g/dL (ref 6.5–8.1)

## 2015-05-29 LAB — CBC
HCT: 41.5 % (ref 36.0–46.0)
HEMOGLOBIN: 13.3 g/dL (ref 12.0–15.0)
MCH: 31.9 pg (ref 26.0–34.0)
MCHC: 32 g/dL (ref 30.0–36.0)
MCV: 99.5 fL (ref 78.0–100.0)
Platelets: 232 10*3/uL (ref 150–400)
RBC: 4.17 MIL/uL (ref 3.87–5.11)
RDW: 15.6 % — ABNORMAL HIGH (ref 11.5–15.5)
WBC: 5.2 10*3/uL (ref 4.0–10.5)

## 2015-05-29 LAB — URINALYSIS, ROUTINE W REFLEX MICROSCOPIC
Bilirubin Urine: NEGATIVE
Glucose, UA: NEGATIVE mg/dL
Hgb urine dipstick: NEGATIVE
Ketones, ur: NEGATIVE mg/dL
LEUKOCYTES UA: NEGATIVE
NITRITE: NEGATIVE
PH: 6 (ref 5.0–8.0)
Protein, ur: NEGATIVE mg/dL
SPECIFIC GRAVITY, URINE: 1.015 (ref 1.005–1.030)

## 2015-05-29 LAB — CBG MONITORING, ED: Glucose-Capillary: 112 mg/dL — ABNORMAL HIGH (ref 65–99)

## 2015-05-29 NOTE — ED Provider Notes (Signed)
CSN: [id number]     Arrival date & time 05/29/15  1219 History   By signing my name below, I, Erling Conte, attest that this documentation has been prepared under the direction and in the presence of Noemi Chapel, MD. Electronically Signed: Erling Conte, ED Scribe. 05/29/2015. 3:32 PM.    Chief Complaint  Patient presents with  . Altered Mental Status    LEVEL 5 CAVEAT-ALTERED MENTAL STATUS  The history is provided by the patient and the nursing home. The history is limited by the condition of the patient. No language interpreter was used.    HPI Comments: [name] is a 82 y.o. female who presents to the Emergency Department from [facility] for evaluation of altered mental status and increased confusion. Per nursing home this has historically happened when pt had a UTI. Pt currently has no complaints and answers questions appropriately, though she is hard of hearing.   Past Medical History  Diagnosis Date  . Urinary retention   . Gastritis   . Esophageal ulcer   . Salmonella sepsis ([place])   . Degenerative disk disease   . Spinal stenosis   . Asthma   . COPD (chronic obstructive pulmonary disease) ([place])   . Diabetes mellitus, type 2 ([place])   . Diverticulosis of colon   . GERD (gastroesophageal reflux disease)   . Hypertension   . Osteoarthritis   . Hiatal hernia   . Pancreatitis   . S/P endoscopy 2009    Dr. Oneida Alar: gastritis  . Diverticulitis   . S/P endoscopy March 2012    Dr. Gala Romney: NSAID induced ulcer  . Hypothyroidism   . MGUS (monoclonal gammopathy of unknown significance)   . Stroke ([place])   . Dementia   . Renal disorder    Past Surgical History  Procedure Laterality Date  . Cervical fusion    . Cholecystectomy    . Ercp N/A 02/02/2014    Procedure: ENDOSCOPIC RETROGRADE CHOLANGIOPANCREATOGRAPHY (ERCP), STONE BASKET EXTRACTION;  Surgeon: Daneil Dolin, MD;  Location: [facility];  Service: Endoscopy;  Laterality: N/A;  . Sphincterotomy  N/A 02/02/2014    Procedure: SPHINCTEROTOMY;  Surgeon: Daneil Dolin, MD;  Location: [facility];  Service: Endoscopy;  Laterality: N/A;  . Balloon dilation N/A 02/02/2014    Procedure: BALLOON DILATION;  Surgeon: Daneil Dolin, MD;  Location: [facility];  Service: Endoscopy;  Laterality: N/A;  . Biliary stent placement N/A 02/02/2014    Procedure: BILIARY STENT PLACEMENT;  Surgeon: Daneil Dolin, MD;  Location: [facility];  Service: Endoscopy;  Laterality: N/A;  . Spyglass cholangioscopy N/A 02/02/2014    Procedure: [id number] CHOLANGIOSCOPY;  Surgeon: Daneil Dolin, MD;  Location: [facility];  Service: Endoscopy;  Laterality: N/A;  . Esophagogastroduodenoscopy N/A 02/05/2014    Procedure: ESOPHAGOGASTRODUODENOSCOPY (EGD);  Surgeon: [name], MD;  [facility];  Service: Endoscopy;  Laterality: N/A;   Family History  Problem Relation Age of Onset  . Diabetes Mother    Social History  Substance Use Topics  . Smoking status: Never Smoker   . Smokeless tobacco: Never Used  . Alcohol Use: No   OB History    No data available     Review of Systems  Unable to perform ROS: Mental status change      Allergies  Review of patient's allergies indicates no known allergies.  Home Medications   Prior to Admission medications   Medication Sig Start Date End Date Taking? Authorizing Provider  acetaminophen (TYLENOL) 325 MG tablet Take 2 tablets (650 mg total) by mouth every 6 (six) hours as needed for mild pain (or Fever >/= 101). 04/27/15  Yes Albertine Patricia, MD  aspirin 81 MG tablet Take 81 mg by mouth daily.   Yes Historical Provider, MD  Calcium Citrate-Vitamin D 200-250 MG-UNIT TABS Take 1 tablet by mouth daily.   Yes Historical Provider, MD  carbamide peroxide (EARWAX TREATMENT DROPS) 6.5 % otic solution Place 5 drops into both ears 2 (two) times daily as needed (build up).   Yes Historical Provider, MD  lactose free nutrition (BOOST PLUS) LIQD Take 237 mLs by mouth 3 (three) times  daily with meals. 04/27/15  Yes Albertine Patricia, MD  levETIRAcetam (KEPPRA) 250 MG tablet Take 1 tablet (250 mg total) by mouth daily. 02/09/14  Yes [name], DO  levothyroxine (SYNTHROID, LEVOTHROID) 25 MCG tablet Take 25 mcg by mouth daily before breakfast.   Yes Historical Provider, MD  montelukast (SINGULAIR) 10 MG tablet Take 10 mg by mouth every morning.    Yes Historical Provider, MD  Multiple Vitamin (DAILY VITAMIN PO) Take 1 tablet by mouth daily.   Yes Historical Provider, MD  polyethylene glycol (MIRALAX / GLYCOLAX) packet Take 17 g by mouth daily as needed for mild constipation.   Yes Historical Provider, MD  solifenacin (VESICARE) 5 MG tablet Take 5 mg by mouth daily.   Yes Historical Provider, MD  tamsulosin (FLOMAX) 0.4 MG CAPS capsule Take 1 capsule (0.4 mg total) by mouth daily. 02/08/14  Yes [name], DO  theophylline (UNIPHYL) 400 MG 24 hr tablet Take 400 mg by mouth daily.   Yes Historical Provider, MD   Triage Vitals: BP 157/72 mmHg  Pulse 100  Temp(Src) 98.1 F (36.7 C) (Oral)  Resp 18  Ht 5\' 5"  (1.651 m)  Wt 105 lb (47.628 kg)  BMI 17.47 kg/m2  SpO2 100%  Physical Exam  Constitutional: She is oriented to person, place, and time. She appears well-developed and well-nourished.  HENT:  Head: Normocephalic and atraumatic.  Eyes: Conjunctivae and EOM are normal. Pupils are equal, round, and reactive to light.  Neck: Normal range of motion. Neck supple.  Cardiovascular: Normal rate and regular rhythm.   Murmur (Soft) heard.  Systolic murmur is present  Pulmonary/Chest: Effort normal and breath sounds normal.  Abdominal: Soft. Bowel sounds are normal. There is no tenderness.  Musculoskeletal: Normal range of motion.  Neurological: She is alert and oriented to person, place, and time.  Follows commands - memory intact - normal grips, normal FNF.  Skin: Skin is warm and dry.  Psychiatric: She has a normal mood and affect. Her behavior is normal.   Nursing note and vitals reviewed.   ED Course  Procedures (including critical care time)  DIAGNOSTIC STUDIES: Oxygen Saturation is 100% on RA, normal by my interpretation.    COORDINATION OF CARE: 12:55 PM- Will order diagnostic lab workup. Pt advised of plan for treatment and pt agrees.  Labs Review Labs Reviewed  CBC - Abnormal; Notable for the following:    RDW 15.6 (*)    All other components within normal limits  COMPREHENSIVE METABOLIC PANEL - Abnormal; Notable for the following:    Glucose, Bld 112 (*)    BUN 40 (*)    Creatinine, Ser 1.11 (*)    GFR calc non Af Amer 39 (*)    GFR calc Af Amer 46 (*)    All other components within normal limits  CBG MONITORING, ED - Abnormal; Notable for the following:    Glucose-Capillary 112 (*)    All other components within normal limits  URINE CULTURE  URINALYSIS, ROUTINE W REFLEX MICROSCOPIC (NOT AT [facility])    Imaging Review No results found. I have personally reviewed and evaluated these results as part of my medical decision-making.    MDM   Final diagnoses:  Transient alteration of awareness    Normal MS - she has normal VS and normal labs - infact her UA is clean and Cr is better than prior values - pt has normal level of alertness - follows commands and and answers questions appropriately.  No signs of decompensation - VS without acute findings.     I personally performed the services described in this documentation, which was scribed in my presence. The recorded information has been reviewed and is accurate.       Noemi Chapel, MD 05/29/15 [phone]

## 2015-05-29 NOTE — ED Notes (Signed)
Pt sent from [facility] assisted living for evaluation of increased confusion.  States that this happens when pt has a UTI.

## 2015-06-01 LAB — URINE CULTURE

## 2015-06-02 ENCOUNTER — Telehealth (HOSPITAL_COMMUNITY): Payer: Self-pay

## 2015-06-02 NOTE — Progress Notes (Signed)
ED Antimicrobial Stewardship Positive Culture Follow Up   [name] is an 82 y.o. female who presented to [facility] on 05/29/2015 with a chief complaint of  Chief Complaint  Patient presents with  . Altered Mental Status    Recent Results (from the past 720 hour(s))  Urine culture     Status: None   Collection Time: 05/29/15  1:50 PM  Result Value Ref Range Status   Specimen Description URINE, CATHETERIZED  Final   Special Requests NONE  Final   Culture   Final    >=100,000 COLONIES/mL KLEBSIELLA PNEUMONIAE Performed at [facility]    Report Status 06/01/2015 FINAL  Final   Organism ID, Bacteria KLEBSIELLA PNEUMONIAE  Final      Susceptibility   Klebsiella pneumoniae - MIC*    AMPICILLIN >=32 RESISTANT Resistant     CEFAZOLIN <=4 SENSITIVE Sensitive     CEFTRIAXONE <=1 SENSITIVE Sensitive     CIPROFLOXACIN 0.5 SENSITIVE Sensitive     GENTAMICIN <=1 SENSITIVE Sensitive     IMIPENEM <=0.25 SENSITIVE Sensitive     NITROFURANTOIN 32 SENSITIVE Sensitive     TRIMETH/SULFA <=20 SENSITIVE Sensitive     AMPICILLIN/SULBACTAM >=32 RESISTANT Resistant     PIP/TAZO <=4 SENSITIVE Sensitive     * >=100,000 COLONIES/mL KLEBSIELLA PNEUMONIAE    []  Treated with , organism resistant to prescribed antimicrobial [x]  Patient discharged originally without antimicrobial agent and treatment is now indicated  New antibiotic prescription: Bactrim DS 1 tablet daily for 5 days  ED Provider: Jennelle Human. Diona Foley, PharmD, [place] Clinical Pharmacist Pager [phone] 06/02/2015, 9:02 AM Infectious Diseases Pharmacist Phone# [phone]

## 2015-06-02 NOTE — Telephone Encounter (Signed)
Post ED Visit - Positive Culture Follow-up: Chart Hand-off to ED Flow Manager  Culture assessed and recommendations reviewed by: []  Levester Fresh, Pharm.D., BCPS []  Heide Guile, Pharm.D., BCPS-AQ ID []  Alycia Rossetti, Pharm.D., BCPS []  Farmers, Pharm.D., BCPS, AAHIVP []  Legrand Como, Pharm .D., BCPS, AAHIVP []  Cassie Stewart, Pharm.D. [x]  Sharilyn Sites Pharm D  Dimitri Ped, [place].D.  Positive urine culture  [x]  Patient discharged without antimicrobial prescription and treatment is now indicated []  Organism is resistant to prescribed ED discharge antimicrobial []  Patient with positive blood cultures  Changes discussed with ED provider: [facility] New antibiotic prescription needs Bactrim Ds 1 tab daily. X 5 days.  Information faxed to [facility] long term care center. [phone]   Ileene Musa 06/02/2015, 12:59 PM

## 2015-06-13 ENCOUNTER — Emergency Department (HOSPITAL_COMMUNITY)
Admission: EM | Admit: 2015-06-13 | Discharge: 2015-06-13 | Disposition: A | Discharge status: Home / Self Care | Payer: Medicare Other | Attending: Emergency Medicine | Admitting: Emergency Medicine

## 2015-06-13 ENCOUNTER — Encounter (HOSPITAL_COMMUNITY): Payer: Self-pay

## 2015-06-13 DIAGNOSIS — Z79899 Other long term (current) drug therapy: Secondary | ICD-10-CM | POA: Insufficient documentation

## 2015-06-13 DIAGNOSIS — Z8619 Personal history of other infectious and parasitic diseases: Secondary | ICD-10-CM | POA: Insufficient documentation

## 2015-06-13 DIAGNOSIS — Y939 Activity, unspecified: Secondary | ICD-10-CM | POA: Insufficient documentation

## 2015-06-13 DIAGNOSIS — E119 Type 2 diabetes mellitus without complications: Secondary | ICD-10-CM | POA: Insufficient documentation

## 2015-06-13 DIAGNOSIS — J449 Chronic obstructive pulmonary disease, unspecified: Secondary | ICD-10-CM | POA: Diagnosis not present

## 2015-06-13 DIAGNOSIS — M1990 Unspecified osteoarthritis, unspecified site: Secondary | ICD-10-CM | POA: Diagnosis not present

## 2015-06-13 DIAGNOSIS — E039 Hypothyroidism, unspecified: Secondary | ICD-10-CM | POA: Diagnosis not present

## 2015-06-13 DIAGNOSIS — T148 Other injury of unspecified body region: Secondary | ICD-10-CM | POA: Diagnosis not present

## 2015-06-13 DIAGNOSIS — S0003XA Contusion of scalp, initial encounter: Secondary | ICD-10-CM | POA: Insufficient documentation

## 2015-06-13 DIAGNOSIS — W19XXXA Unspecified fall, initial encounter: Secondary | ICD-10-CM | POA: Diagnosis not present

## 2015-06-13 DIAGNOSIS — S0083XA Contusion of other part of head, initial encounter: Secondary | ICD-10-CM | POA: Diagnosis not present

## 2015-06-13 DIAGNOSIS — I10 Essential (primary) hypertension: Secondary | ICD-10-CM | POA: Diagnosis not present

## 2015-06-13 DIAGNOSIS — Z862 Personal history of diseases of the blood and blood-forming organs and certain disorders involving the immune mechanism: Secondary | ICD-10-CM | POA: Insufficient documentation

## 2015-06-13 DIAGNOSIS — Z8673 Personal history of transient ischemic attack (TIA), and cerebral infarction without residual deficits: Secondary | ICD-10-CM | POA: Insufficient documentation

## 2015-06-13 DIAGNOSIS — Z7982 Long term (current) use of aspirin: Secondary | ICD-10-CM | POA: Diagnosis not present

## 2015-06-13 DIAGNOSIS — F0390 Unspecified dementia without behavioral disturbance: Secondary | ICD-10-CM | POA: Diagnosis not present

## 2015-06-13 DIAGNOSIS — S0990XA Unspecified injury of head, initial encounter: Secondary | ICD-10-CM

## 2015-06-13 DIAGNOSIS — Y998 Other external cause status: Secondary | ICD-10-CM | POA: Diagnosis not present

## 2015-06-13 DIAGNOSIS — Y92129 Unspecified place in nursing home as the place of occurrence of the external cause: Secondary | ICD-10-CM | POA: Diagnosis not present

## 2015-06-13 DIAGNOSIS — Z8719 Personal history of other diseases of the digestive system: Secondary | ICD-10-CM | POA: Insufficient documentation

## 2015-06-13 NOTE — ED Notes (Signed)
Pt here from [facility] for evaluation of head injury. Pt has a unwitnessed fall. Pt has a hematoma to the back of the head. Pt is aware she fell but she cannot say why she fell

## 2015-06-13 NOTE — ED Provider Notes (Signed)
CSN: [id number]     Arrival date & time 06/13/15  1512 History   First MD Initiated Contact with Patient 06/13/15 1534     Chief Complaint  Patient presents with  . Fall   Level V caveat: Dementia  HPI Patient presents to the emergency department after a fall with head injury today.  She is at a nursing facility.  She presents with a hematoma to her posterior scalp.  She does not remember the fall.  She has dementia.  She is 82 years old.  Family states that if she were to have a head injury such as a subdural hematoma that they would not want her to have surgery given her advanced age and dementia.  Patient denies headache.   Past Medical History  Diagnosis Date  . Urinary retention   . Gastritis   . Esophageal ulcer   . Salmonella sepsis ([place])   . Degenerative disk disease   . Spinal stenosis   . Asthma   . COPD (chronic obstructive pulmonary disease) ([place])   . Diabetes mellitus, type 2 ([place])   . Diverticulosis of colon   . GERD (gastroesophageal reflux disease)   . Hypertension   . Osteoarthritis   . Hiatal hernia   . Pancreatitis   . S/P endoscopy 2009    Dr. Oneida Alar: gastritis  . Diverticulitis   . S/P endoscopy March 2012    Dr. Gala Romney: NSAID induced ulcer  . Hypothyroidism   . MGUS (monoclonal gammopathy of unknown significance)   . Stroke ([place])   . Dementia   . Renal disorder    Past Surgical History  Procedure Laterality Date  . Cervical fusion    . Cholecystectomy    . Ercp N/A 02/02/2014    Procedure: ENDOSCOPIC RETROGRADE CHOLANGIOPANCREATOGRAPHY (ERCP), STONE BASKET EXTRACTION;  Surgeon: Daneil Dolin, MD;  Location: [facility];  Service: Endoscopy;  Laterality: N/A;  . Sphincterotomy N/A 02/02/2014    Procedure: SPHINCTEROTOMY;  Surgeon: Daneil Dolin, MD;  Location: [facility];  Service: Endoscopy;  Laterality: N/A;  . Balloon dilation N/A 02/02/2014    Procedure: BALLOON DILATION;  Surgeon: Daneil Dolin, MD;  Location: [facility];  Service: Endoscopy;  Laterality:  N/A;  . Biliary stent placement N/A 02/02/2014    Procedure: BILIARY STENT PLACEMENT;  Surgeon: Daneil Dolin, MD;  Location: [facility];  Service: Endoscopy;  Laterality: N/A;  . Spyglass cholangioscopy N/A 02/02/2014    Procedure: [id number] CHOLANGIOSCOPY;  Surgeon: Daneil Dolin, MD;  Location: [facility];  Service: Endoscopy;  Laterality: N/A;  . Esophagogastroduodenoscopy N/A 02/05/2014    Procedure: ESOPHAGOGASTRODUODENOSCOPY (EGD);  Surgeon: [name], MD;  [facility];  Service: Endoscopy;  Laterality: N/A;   Family History  Problem Relation Age of Onset  . Diabetes Mother    Social History  Substance Use Topics  . Smoking status: Never Smoker   . Smokeless tobacco: Never Used  . Alcohol Use: No   OB History    No data available     Review of Systems  Unable to perform ROS: Dementia      Allergies  Review of patient's allergies indicates no known allergies.  Home Medications   Prior to Admission medications   Medication Sig Start Date End Date Taking? Authorizing Provider  acetaminophen (TYLENOL) 325 MG tablet Take 2 tablets (650 mg total) by mouth every 6 (six) hours as needed for mild pain (or Fever >/= 101). 04/27/15   Albertine Patricia, MD  aspirin 81 MG tablet Take 81 mg by mouth daily.    Historical Provider, MD  Calcium Citrate-Vitamin D 200-250 MG-UNIT TABS Take 1 tablet by mouth daily.    Historical Provider, MD  carbamide peroxide (EARWAX TREATMENT DROPS) 6.5 % otic solution Place 5 drops into both ears 2 (two) times daily as needed (build up).    Historical Provider, MD  lactose free nutrition (BOOST PLUS) LIQD Take 237 mLs by mouth 3 (three) times daily with meals. 04/27/15   Silver Huguenin Elgergawy, MD  levETIRAcetam (KEPPRA) 250 MG tablet Take 1 tablet (250 mg total) by mouth daily. 02/09/14   [name], DO  levothyroxine (SYNTHROID, LEVOTHROID) 25 MCG tablet Take 25 mcg by mouth daily before breakfast.    Historical Provider, MD  montelukast  (SINGULAIR) 10 MG tablet Take 10 mg by mouth every morning.     Historical Provider, MD  Multiple Vitamin (DAILY VITAMIN PO) Take 1 tablet by mouth daily.    Historical Provider, MD  polyethylene glycol (MIRALAX / GLYCOLAX) packet Take 17 g by mouth daily as needed for mild constipation.    Historical Provider, MD  solifenacin (VESICARE) 5 MG tablet Take 5 mg by mouth daily.    Historical Provider, MD  tamsulosin (FLOMAX) 0.4 MG CAPS capsule Take 1 capsule (0.4 mg total) by mouth daily. 02/08/14   [name], DO  theophylline (UNIPHYL) 400 MG 24 hr tablet Take 400 mg by mouth daily.    Historical Provider, MD   BP 152/92 mmHg  Pulse 96  Temp(Src) 98 F (36.7 C) (Oral)  Resp 18  SpO2 96% Physical Exam  Constitutional: She is oriented to person, place, and time. She appears well-developed and well-nourished. No distress.  HENT:  Head: Normocephalic.  Posterior right parietal scalp hematoma.  No lacerations.  Eyes: EOM are normal.  Neck: Normal range of motion. Neck supple.  C-spine nontender.  Cardiovascular: Normal rate, regular rhythm and normal heart sounds.   Pulmonary/Chest: Effort normal and breath sounds normal.  Abdominal: Soft. She exhibits no distension. There is no tenderness.  Musculoskeletal: Normal range of motion.  Full range of motion bilateral ankles, knees, hips.  Neurological: She is alert and oriented to person, place, and time.  Skin: Skin is warm and dry.  Psychiatric: She has a normal mood and affect. Judgment normal.  Nursing note and vitals reviewed.   ED Course  Procedures (including critical care time) Labs Review Labs Reviewed - No data to display  Imaging Review No results found. I have personally reviewed and evaluated these images and lab results as part of my medical decision-making.   EKG Interpretation None      MDM   Final diagnoses:  Head injury, initial encounter  Scalp hematoma, initial encounter    Long discussion with the  patient's daughter who reports that she did have an intracranial injury that required neurosurgical intervention that she would not consent for this given her advanced age and dementia.  Given this I do not think there is any utility in a CT scan.    Jola Schmidt, MD 06/13/15 [phone]

## 2015-06-24 DIAGNOSIS — E875 Hyperkalemia: Secondary | ICD-10-CM | POA: Diagnosis not present

## 2015-06-24 DIAGNOSIS — J449 Chronic obstructive pulmonary disease, unspecified: Secondary | ICD-10-CM | POA: Diagnosis not present

## 2015-06-24 DIAGNOSIS — Z79891 Long term (current) use of opiate analgesic: Secondary | ICD-10-CM | POA: Diagnosis not present

## 2015-06-24 DIAGNOSIS — M1990 Unspecified osteoarthritis, unspecified site: Secondary | ICD-10-CM | POA: Diagnosis not present

## 2015-06-24 DIAGNOSIS — M6281 Muscle weakness (generalized): Secondary | ICD-10-CM | POA: Diagnosis not present

## 2015-06-24 DIAGNOSIS — Z792 Long term (current) use of antibiotics: Secondary | ICD-10-CM | POA: Diagnosis not present

## 2015-06-29 DIAGNOSIS — M1990 Unspecified osteoarthritis, unspecified site: Secondary | ICD-10-CM | POA: Diagnosis not present

## 2015-06-29 DIAGNOSIS — J449 Chronic obstructive pulmonary disease, unspecified: Secondary | ICD-10-CM | POA: Diagnosis not present

## 2015-06-29 DIAGNOSIS — M6281 Muscle weakness (generalized): Secondary | ICD-10-CM | POA: Diagnosis not present

## 2015-07-03 ENCOUNTER — Encounter (HOSPITAL_COMMUNITY): Payer: Self-pay | Admitting: Cardiology

## 2015-07-03 ENCOUNTER — Emergency Department (HOSPITAL_COMMUNITY): Payer: Medicare Other

## 2015-07-03 ENCOUNTER — Inpatient Hospital Stay (HOSPITAL_COMMUNITY)
Admission: EM | Admit: 2015-07-03 | Discharge: 2015-07-06 | DRG: 690 | Disposition: A | Discharge status: Skilled Nursing Facility | Payer: Medicare Other | Attending: Internal Medicine | Admitting: Internal Medicine

## 2015-07-03 DIAGNOSIS — Z794 Long term (current) use of insulin: Secondary | ICD-10-CM

## 2015-07-03 DIAGNOSIS — K2970 Gastritis, unspecified, without bleeding: Secondary | ICD-10-CM | POA: Diagnosis not present

## 2015-07-03 DIAGNOSIS — F0390 Unspecified dementia without behavioral disturbance: Secondary | ICD-10-CM | POA: Diagnosis present

## 2015-07-03 DIAGNOSIS — R509 Fever, unspecified: Secondary | ICD-10-CM | POA: Diagnosis present

## 2015-07-03 DIAGNOSIS — R1013 Epigastric pain: Secondary | ICD-10-CM | POA: Diagnosis not present

## 2015-07-03 DIAGNOSIS — Z833 Family history of diabetes mellitus: Secondary | ICD-10-CM

## 2015-07-03 DIAGNOSIS — E039 Hypothyroidism, unspecified: Secondary | ICD-10-CM | POA: Diagnosis present

## 2015-07-03 DIAGNOSIS — R7881 Bacteremia: Secondary | ICD-10-CM | POA: Diagnosis not present

## 2015-07-03 DIAGNOSIS — I10 Essential (primary) hypertension: Secondary | ICD-10-CM | POA: Diagnosis not present

## 2015-07-03 DIAGNOSIS — R112 Nausea with vomiting, unspecified: Secondary | ICD-10-CM | POA: Diagnosis not present

## 2015-07-03 DIAGNOSIS — Z7982 Long term (current) use of aspirin: Secondary | ICD-10-CM

## 2015-07-03 DIAGNOSIS — Z8673 Personal history of transient ischemic attack (TIA), and cerebral infarction without residual deficits: Secondary | ICD-10-CM

## 2015-07-03 DIAGNOSIS — J438 Other emphysema: Secondary | ICD-10-CM

## 2015-07-03 DIAGNOSIS — Z66 Do not resuscitate: Secondary | ICD-10-CM | POA: Diagnosis present

## 2015-07-03 DIAGNOSIS — E119 Type 2 diabetes mellitus without complications: Secondary | ICD-10-CM | POA: Diagnosis not present

## 2015-07-03 DIAGNOSIS — N390 Urinary tract infection, site not specified: Secondary | ICD-10-CM | POA: Diagnosis not present

## 2015-07-03 DIAGNOSIS — J45909 Unspecified asthma, uncomplicated: Secondary | ICD-10-CM | POA: Diagnosis not present

## 2015-07-03 DIAGNOSIS — K219 Gastro-esophageal reflux disease without esophagitis: Secondary | ICD-10-CM | POA: Diagnosis not present

## 2015-07-03 DIAGNOSIS — E0811 Diabetes mellitus due to underlying condition with ketoacidosis with coma: Secondary | ICD-10-CM | POA: Diagnosis not present

## 2015-07-03 DIAGNOSIS — Z8719 Personal history of other diseases of the digestive system: Secondary | ICD-10-CM | POA: Diagnosis not present

## 2015-07-03 DIAGNOSIS — J449 Chronic obstructive pulmonary disease, unspecified: Secondary | ICD-10-CM | POA: Diagnosis present

## 2015-07-03 DIAGNOSIS — M79606 Pain in leg, unspecified: Secondary | ICD-10-CM | POA: Diagnosis not present

## 2015-07-03 DIAGNOSIS — B961 Klebsiella pneumoniae [K. pneumoniae] as the cause of diseases classified elsewhere: Secondary | ICD-10-CM | POA: Diagnosis present

## 2015-07-03 DIAGNOSIS — R000 Tachycardia, unspecified: Secondary | ICD-10-CM | POA: Diagnosis not present

## 2015-07-03 DIAGNOSIS — R52 Pain, unspecified: Secondary | ICD-10-CM

## 2015-07-03 LAB — URINALYSIS, ROUTINE W REFLEX MICROSCOPIC
GLUCOSE, UA: NEGATIVE mg/dL
LEUKOCYTES UA: NEGATIVE
Nitrite: NEGATIVE
PH: 5 (ref 5.0–8.0)
Protein, ur: 100 mg/dL — AB

## 2015-07-03 LAB — COMPREHENSIVE METABOLIC PANEL
ALT: 19 U/L (ref 14–54)
ANION GAP: 16 — AB (ref 5–15)
AST: 52 U/L — ABNORMAL HIGH (ref 15–41)
Albumin: 3.8 g/dL (ref 3.5–5.0)
Alkaline Phosphatase: 63 U/L (ref 38–126)
BUN: 42 mg/dL — ABNORMAL HIGH (ref 6–20)
CHLORIDE: 107 mmol/L (ref 101–111)
CO2: 22 mmol/L (ref 22–32)
CREATININE: 1.25 mg/dL — AB (ref 0.44–1.00)
Calcium: 9.5 mg/dL (ref 8.9–10.3)
GFR, EST AFRICAN AMERICAN: 40 mL/min — AB (ref >60)
GFR, EST NON AFRICAN AMERICAN: 34 mL/min — AB (ref >60)
Glucose, Bld: 167 mg/dL — ABNORMAL HIGH (ref 65–99)
POTASSIUM: 4 mmol/L (ref 3.5–5.1)
SODIUM: 145 mmol/L (ref 135–145)
Total Bilirubin: 0.4 mg/dL (ref 0.3–1.2)
Total Protein: 6.7 g/dL (ref 6.5–8.1)

## 2015-07-03 LAB — CBC WITH DIFFERENTIAL/PLATELET
Basophils Absolute: 0 10*3/uL (ref 0.0–0.1)
Basophils Relative: 0 %
EOS ABS: 0 10*3/uL (ref 0.0–0.7)
Eosinophils Relative: 0 %
HEMATOCRIT: 36.9 % (ref 36.0–46.0)
HEMOGLOBIN: 12 g/dL (ref 12.0–15.0)
LYMPHS ABS: 1 10*3/uL (ref 0.7–4.0)
LYMPHS PCT: 10 %
MCH: 32.6 pg (ref 26.0–34.0)
MCHC: 32.5 g/dL (ref 30.0–36.0)
MCV: 100.3 fL — ABNORMAL HIGH (ref 78.0–100.0)
Monocytes Absolute: 0 10*3/uL — ABNORMAL LOW (ref 0.1–1.0)
Monocytes Relative: 0 %
NEUTROS ABS: 9.4 10*3/uL — AB (ref 1.7–7.7)
NEUTROS PCT: 90 %
Platelets: 164 10*3/uL (ref 150–400)
RBC: 3.68 MIL/uL — AB (ref 3.87–5.11)
RDW: 15 % (ref 11.5–15.5)
WBC: 10.5 10*3/uL (ref 4.0–10.5)

## 2015-07-03 LAB — URINE MICROSCOPIC-ADD ON

## 2015-07-03 LAB — TROPONIN I: TROPONIN I: 0.05 ng/mL — AB (ref <0.031)

## 2015-07-03 LAB — LIPASE, BLOOD: Lipase: 22 U/L (ref 11–51)

## 2015-07-03 LAB — TSH: TSH: 5.175 u[IU]/mL — ABNORMAL HIGH (ref 0.350–4.500)

## 2015-07-03 MED ORDER — ACETAMINOPHEN 325 MG PO TABS
650.0000 mg | ORAL_TABLET | Freq: Four times a day (QID) | ORAL | Status: DC | PRN
  Administered 2015-07-05: 650 mg via ORAL
  Filled 2015-07-03: qty 2

## 2015-07-03 MED ORDER — LEVETIRACETAM 250 MG PO TABS
250.0000 mg | ORAL_TABLET | Freq: Every day | ORAL | Status: DC
  Administered 2015-07-03 – 2015-07-06 (×4): 250 mg via ORAL
  Filled 2015-07-03 (×4): qty 1

## 2015-07-03 MED ORDER — POLYETHYLENE GLYCOL 3350 17 G PO PACK: 17.0000 g | PACK | Freq: Every day | ORAL | Status: DC | PRN

## 2015-07-03 MED ORDER — LEVOTHYROXINE SODIUM 25 MCG PO TABS
25.0000 ug | ORAL_TABLET | Freq: Every day | ORAL | Status: DC
  Administered 2015-07-04 – 2015-07-06 (×3): 25 ug via ORAL
  Filled 2015-07-03 (×3): qty 1

## 2015-07-03 MED ORDER — ONDANSETRON 4 MG PO TBDP
ORAL_TABLET | ORAL | Status: DC
Start: 2015-07-03 — End: 2015-07-06

## 2015-07-03 MED ORDER — HEPARIN SODIUM (PORCINE) 5000 UNIT/ML IJ SOLN
5000.0000 [IU] | Freq: Three times a day (TID) | INTRAMUSCULAR | Status: DC
  Administered 2015-07-03 – 2015-07-06 (×8): 5000 [IU] via SUBCUTANEOUS
  Filled 2015-07-03 (×8): qty 1

## 2015-07-03 MED ORDER — CEPHALEXIN 500 MG PO CAPS: 500.0000 mg | ORAL_CAPSULE | Freq: Once | ORAL | Status: DC

## 2015-07-03 MED ORDER — ADULT MULTIVITAMIN W/MINERALS CH
1.0000 | ORAL_TABLET | Freq: Every day | ORAL | Status: DC
  Administered 2015-07-03 – 2015-07-06 (×4): 1 via ORAL
  Filled 2015-07-03 (×4): qty 1

## 2015-07-03 MED ORDER — FENTANYL CITRATE (PF) 100 MCG/2ML IJ SOLN
50.0000 ug | Freq: Once | INTRAMUSCULAR | Status: AC
  Administered 2015-07-03: 50 ug via INTRAVENOUS
  Filled 2015-07-03: qty 2

## 2015-07-03 MED ORDER — SODIUM CHLORIDE 0.9 % IV BOLUS (SEPSIS)
1000.0000 mL | Freq: Once | INTRAVENOUS | Status: AC
  Administered 2015-07-03: 1000 mL via INTRAVENOUS

## 2015-07-03 MED ORDER — LEVOFLOXACIN IN D5W 750 MG/150ML IV SOLN
750.0000 mg | INTRAVENOUS | Status: DC
  Administered 2015-07-03: 750 mg via INTRAVENOUS
  Filled 2015-07-03: qty 150

## 2015-07-03 MED ORDER — BOOST / RESOURCE BREEZE PO LIQD
240.0000 mL | Freq: Three times a day (TID) | ORAL | Status: DC
  Administered 2015-07-04 – 2015-07-05 (×4): 1 via ORAL
  Administered 2015-07-06: 240 mL via ORAL

## 2015-07-03 MED ORDER — ASPIRIN 81 MG PO CHEW
81.0000 mg | CHEWABLE_TABLET | Freq: Every day | ORAL | Status: DC
  Administered 2015-07-04 – 2015-07-06 (×3): 81 mg via ORAL
  Filled 2015-07-03 (×3): qty 1

## 2015-07-03 MED ORDER — SODIUM CHLORIDE 0.9 % IV SOLN
INTRAVENOUS | Status: DC
  Administered 2015-07-03 – 2015-07-04 (×2): via INTRAVENOUS

## 2015-07-03 MED ORDER — DEXTROSE 5 % IV SOLN
1.0000 g | Freq: Once | INTRAVENOUS | Status: AC
  Administered 2015-07-03: 1 g via INTRAVENOUS
  Filled 2015-07-03: qty 10

## 2015-07-03 MED ORDER — SODIUM CHLORIDE 0.9% FLUSH
3.0000 mL | Freq: Two times a day (BID) | INTRAVENOUS | Status: DC
  Administered 2015-07-06: 3 mL via INTRAVENOUS

## 2015-07-03 NOTE — ED Provider Notes (Signed)
CSN: [id number]     Arrival date & time 07/03/15  1106 History  By signing my name below, I, [name], attest that this documentation has been prepared under the direction and in the presence of [name], MD. Electronically Signed: [name], ED Scribe. 07/03/2015. 12:30 PM.   Chief Complaint  Patient presents with  . Emesis   The history is provided by the patient. History limited by: dementia. No language interpreter was used.   LEVEL 5 CAVEAT DUE TO DEMENTIA HPI Comments: [name] is a 82 y.o. female from [facility] facility, with a history of dementia, gastritis, esophageal ulcer, asthma, COPD, DM type 2, osteoarthritis, diverticulitis, and CVA, who presents to the Emergency Department complaining of epigastric pain, nausea, and vomiting that began this morning, at 10 AM. She also notes left leg pain.  No other details are known at arrival as pt is only stating "I am in pain" and no family available.  Pt has h/o dementia which limits history at this time   Past Medical History  Diagnosis Date  . Urinary retention   . Gastritis   . Esophageal ulcer   . Salmonella sepsis ([place])   . Degenerative disk disease   . Spinal stenosis   . Asthma   . COPD (chronic obstructive pulmonary disease) ([place])   . Diabetes mellitus, type 2 ([place])   . Diverticulosis of colon   . GERD (gastroesophageal reflux disease)   . Hypertension   . Osteoarthritis   . Hiatal hernia   . Pancreatitis   . S/P endoscopy 2009    Dr. Oneida Alar: gastritis  . Diverticulitis   . S/P endoscopy March 2012    Dr. Gala Romney: NSAID induced ulcer  . Hypothyroidism   . MGUS (monoclonal gammopathy of unknown significance)   . Stroke ([place])   . Dementia   . Renal disorder    Past Surgical History  Procedure Laterality Date  . Cervical fusion    . Cholecystectomy    . Ercp N/A 02/02/2014    Procedure: ENDOSCOPIC RETROGRADE CHOLANGIOPANCREATOGRAPHY (ERCP), STONE BASKET EXTRACTION;  Surgeon:  Daneil Dolin, MD;  Location: [facility];  Service: Endoscopy;  Laterality: N/A;  . Sphincterotomy N/A 02/02/2014    Procedure: SPHINCTEROTOMY;  Surgeon: Daneil Dolin, MD;  Location: [facility];  Service: Endoscopy;  Laterality: N/A;  . Balloon dilation N/A 02/02/2014    Procedure: BALLOON DILATION;  Surgeon: Daneil Dolin, MD;  Location: [facility];  Service: Endoscopy;  Laterality: N/A;  . Biliary stent placement N/A 02/02/2014    Procedure: BILIARY STENT PLACEMENT;  Surgeon: Daneil Dolin, MD;  Location: [facility];  Service: Endoscopy;  Laterality: N/A;  . Spyglass cholangioscopy N/A 02/02/2014    Procedure: [id number] CHOLANGIOSCOPY;  Surgeon: Daneil Dolin, MD;  Location: [facility];  Service: Endoscopy;  Laterality: N/A;  . Esophagogastroduodenoscopy N/A 02/05/2014    Procedure: ESOPHAGOGASTRODUODENOSCOPY (EGD);  Surgeon: [name], MD;  Location[facility];  Service: Endoscopy;  Laterality: N/A;   Family History  Problem Relation Age of Onset  . Diabetes Mother    Social History  Substance Use Topics  . Smoking status: Never Smoker   . Smokeless tobacco: Never Used  . Alcohol Use: No   OB History    No data available     Review of Systems  Unable to perform ROS: Dementia   Allergies  Review of patient's allergies indicates no known allergies.  Home Medications   Prior to  Admission medications   Medication Sig Start Date End Date Taking? Authorizing Provider  acetaminophen (TYLENOL) 325 MG tablet Take 2 tablets (650 mg total) by mouth every 6 (six) hours as needed for mild pain (or Fever >/= 101). 04/27/15   Albertine Patricia, MD  aspirin 81 MG tablet Take 81 mg by mouth daily.    Historical Provider, MD  Calcium Citrate-Vitamin D 200-250 MG-UNIT TABS Take 1 tablet by mouth daily.    Historical Provider, MD  carbamide peroxide (EARWAX TREATMENT DROPS) 6.5 % otic solution Place 5 drops into both ears 2 (two) times daily as needed (build up).    Historical Provider, MD  lactose free  nutrition (BOOST PLUS) LIQD Take 237 mLs by mouth 3 (three) times daily with meals. 04/27/15   Silver Huguenin Elgergawy, MD  levETIRAcetam (KEPPRA) 250 MG tablet Take 1 tablet (250 mg total) by mouth daily. 02/09/14   [name], DO  levothyroxine (SYNTHROID, LEVOTHROID) 25 MCG tablet Take 25 mcg by mouth daily before breakfast.    Historical Provider, MD  montelukast (SINGULAIR) 10 MG tablet Take 10 mg by mouth every morning.     Historical Provider, MD  Multiple Vitamin (DAILY VITAMIN PO) Take 1 tablet by mouth daily.    Historical Provider, MD  polyethylene glycol (MIRALAX / GLYCOLAX) packet Take 17 g by mouth daily as needed for mild constipation.    Historical Provider, MD  solifenacin (VESICARE) 5 MG tablet Take 5 mg by mouth daily.    Historical Provider, MD  tamsulosin (FLOMAX) 0.4 MG CAPS capsule Take 1 capsule (0.4 mg total) by mouth daily. 02/08/14   [name], DO  theophylline (UNIPHYL) 400 MG 24 hr tablet Take 400 mg by mouth daily.    Historical Provider, MD   BP 125/84 mmHg  Pulse 139  Temp(Src) 98.9 F (37.2 C) (Oral)  Resp 31  Ht 5\' 3"  (1.6 m)  Wt 100 lb (45.36 kg)  BMI 17.72 kg/m2  SpO2 90% Physical Exam  Nursing note and vitals reviewed. CONSTITUTIONAL: Elderly, frail HEAD: Normocephalic/atraumatic EYES: EOMI/PERRL ENMT: Mucous membranes dry NECK: supple no meningeal signs SPINE/BACK:entire spine nontender CV: Tachycardic LUNGS: Tachypnea noted, with coarse breath sounds bilaterally ABDOMEN: soft, with moderate epigastric tenderness GU:no cva tenderness NEURO: Pt is awake/alert, but confused, and appears agitated, moves all extremitiesx4.  No facial droop.   EXTREMITIES: pulses normal/equal, full ROM, no deformity, distal pulses are intact SKIN: warm, color normal PSYCH: anxious  ED Course  Procedures    Medications  fentaNYL (SUBLIMAZE) injection 50 mcg (50 mcg Intravenous Given 07/03/15 1224)  sodium chloride 0.9 % bolus 1,000 mL (1,000 mLs Intravenous  New Bag/Given 07/03/15 1348)     DIAGNOSTIC STUDIES: Oxygen Saturation is 90% on RA, adequate by my interpretation.    COORDINATION OF CARE: 1:57 PM Pt more comfortable Still with focal abdominal tenderness Will proceed with CT imaging IV fluids have been ordered D/w daughter, she was updated on plan 3:38 PM Pt awaiting CT imaging She is resting comfortably BP 100/48 mmHg  Pulse 106  Temp(Src) 98.9 F (37.2 C) (Oral)  Resp 20  Ht 5\' 3"  (1.6 m)  Wt 45.36 kg  BMI 17.72 kg/m2  SpO2 100% She still has focal epigastric abd tenderness Signed out to dr zammit, CT imaging pending If negative and tolerates PO she can go back to [facility]  Labs Review Labs Reviewed  COMPREHENSIVE METABOLIC PANEL - Abnormal; Notable for the following:    Glucose, Bld 167 (*)  BUN 42 (*)    Creatinine, Ser 1.25 (*)    AST 52 (*)    GFR calc non Af Amer 34 (*)    GFR calc Af Amer 40 (*)    Anion gap 16 (*)    All other components within normal limits  CBC WITH DIFFERENTIAL/PLATELET - Abnormal; Notable for the following:    RBC 3.68 (*)    MCV 100.3 (*)    Neutro Abs 9.4 (*)    Monocytes Absolute 0.0 (*)    All other components within normal limits  TROPONIN I - Abnormal; Notable for the following:    Troponin I 0.05 (*)    All other components within normal limits  URINALYSIS, ROUTINE W REFLEX MICROSCOPIC (NOT AT [facility]) - Abnormal; Notable for the following:    Specific Gravity, Urine >1.030 (*)    Hgb urine dipstick LARGE (*)    Bilirubin Urine SMALL (*)    Ketones, ur TRACE (*)    Protein, ur 100 (*)    All other components within normal limits  URINE MICROSCOPIC-ADD ON - Abnormal; Notable for the following:    Squamous Epithelial / LPF 0-5 (*)    Bacteria, UA MANY (*)    All other components within normal limits  LIPASE, BLOOD    Imaging Review Dg Abd Acute W/chest  07/03/2015  CLINICAL DATA:  Nausea, vomiting, epigastric pain beginning this morning. EXAM: DG ABDOMEN ACUTE W/  1V CHEST COMPARISON:  05/07/2015 FINDINGS: Mild hyperinflation of the lungs compatible with COPD. Heart is normal size. No confluent opacities or effusions. Nonobstructive bowel gas pattern. Moderate stool burden in the colon. Biliary stents noted in the right upper quadrant, in similar position to prior CT. No free air organomegaly. Prior cholecystectomy. IMPRESSION: Moderate stool burden. No evidence of bowel obstruction or free air. COPD.  No active cardiopulmonary disease. Electronically Signed   By: Rolm Baptise M.D.   On: 07/03/2015 12:38   I have personally reviewed and evaluated these images and lab results as part of my medical decision-making.   EKG Interpretation   Date/Time:  Tuesday July 03 2015 12:33:20 EST Ventricular Rate:  115 PR Interval:  157 QRS Duration: 80 QT Interval:  337 QTC Calculation: 466 R Axis:   59 Text Interpretation:  Sinus tachycardia Anterior infarct, old Nonspecific  T abnormalities, lateral leads artifact noted No significant change since  last tracing Confirmed by Christy Gentles  MD, [place] ([id number]) on 07/03/2015  12:53:01 PM      MDM   Final diagnoses:  None    Nursing notes including past medical history and social history reviewed and considered in documentation xrays/imaging reviewed by myself and considered during evaluation Labs/vital reviewed myself and considered during evaluation   I personally performed the services described in this documentation, which was scribed in my presence. The recorded information has been reviewed and is accurate.       [name], MD 07/03/15 1539

## 2015-07-03 NOTE — ED Notes (Signed)
Nausea, vomiting and epigastric pain,  Left leg pain.  Started at 10am.  From [facility].  CBG 161 b/p 96/70.

## 2015-07-03 NOTE — ED Provider Notes (Signed)
The patient had a temperature over 102 prior to discharge. So the decision was to admit her for treatment for her urinary tract infection. She was given 1 g Rocephin and admitted by medicine  Milton Ferguson, MD 07/03/15 [phone]

## 2015-07-03 NOTE — H&P (Signed)
[facility] History and Physical  [name] [id number] DOB: December 17, 1912    PCP:   [name], MD   Chief Complaint: Vomiting.  HPI: [name] is an 82 y.o. female from [place], with a history of dementia, gastritis, esophageal ulcer, asthma, COPD, DM type 2, osteoarthritis, diverticulitis, and CVA, who presents to the Emergency as she had an episode of vomiting this am.  No other details are known at arrival as pt is only stating "I am in pain" and no family available. Pt has h/o dementia which limits history at this time.  Work up in the ER with abdominal CT showed COPD, and increase stool burden, but no free air, and no acute process.  Her WBC was 10.5K, and her UA was borderline.  She was recently started on Bactrim for presumed UTI, but unclear if she had taken them.  She was going to be sent back, but she spiked a temp of 102 in the ER, and hospitalist was asked to admit her for possible UTI.    Rewiew of Systems: Unable.    Past Medical History  Diagnosis Date  . Urinary retention   . Gastritis   . Esophageal ulcer   . Salmonella sepsis ([place])   . Degenerative disk disease   . Spinal stenosis   . Asthma   . COPD (chronic obstructive pulmonary disease) ([place])   . Diabetes mellitus, type 2 ([place])   . Diverticulosis of colon   . GERD (gastroesophageal reflux disease)   . Hypertension   . Osteoarthritis   . Hiatal hernia   . Pancreatitis   . S/P endoscopy 2009    Dr. Oneida Alar: gastritis  . Diverticulitis   . S/P endoscopy March 2012    Dr. Gala Romney: NSAID induced ulcer  . Hypothyroidism   . MGUS (monoclonal gammopathy of unknown significance)   . Stroke ([place])   . Dementia   . Renal disorder     Past Surgical History  Procedure Laterality Date  . Cervical fusion    . Cholecystectomy    . Ercp N/A 02/02/2014    Procedure: ENDOSCOPIC RETROGRADE CHOLANGIOPANCREATOGRAPHY (ERCP), STONE BASKET EXTRACTION;   Surgeon: Daneil Dolin, MD;  Location: [facility];  Service: Endoscopy;  Laterality: N/A;  . Sphincterotomy N/A 02/02/2014    Procedure: SPHINCTEROTOMY;  Surgeon: Daneil Dolin, MD;  Location: [facility];  Service: Endoscopy;  Laterality: N/A;  . Balloon dilation N/A 02/02/2014    Procedure: BALLOON DILATION;  Surgeon: Daneil Dolin, MD;  Location: [facility];  Service: Endoscopy;  Laterality: N/A;  . Biliary stent placement N/A 02/02/2014    Procedure: BILIARY STENT PLACEMENT;  Surgeon: Daneil Dolin, MD;  Location: [facility];  Service: Endoscopy;  Laterality: N/A;  . Spyglass cholangioscopy N/A 02/02/2014    Procedure: [id number] CHOLANGIOSCOPY;  Surgeon: Daneil Dolin, MD;  Location: [facility];  Service: Endoscopy;  Laterality: N/A;  . Esophagogastroduodenoscopy N/A 02/05/2014    Procedure: ESOPHAGOGASTRODUODENOSCOPY (EGD);  Surgeon: [name], MD;  [facility];  Service: Endoscopy;  Laterality: N/A;    Medications:  HOME MEDS: Prior to Admission medications   Medication Sig Start Date End Date Taking? Authorizing Provider  acetaminophen (TYLENOL) 325 MG tablet Take 2 tablets (650 mg total) by mouth every 6 (six) hours as needed for mild pain (or Fever >/= 101). 04/27/15  Yes Albertine Patricia, MD  aspirin 81 MG tablet Take 81 mg by mouth daily.  Yes Historical Provider, MD  Calcium Citrate-Vitamin D 200-250 MG-UNIT TABS Take 1 tablet by mouth daily.   Yes Historical Provider, MD  carbamide peroxide (EARWAX TREATMENT DROPS) 6.5 % otic solution Place 5 drops into both ears 2 (two) times daily as needed (build up).   Yes Historical Provider, MD  levETIRAcetam (KEPPRA) 250 MG tablet Take 1 tablet (250 mg total) by mouth daily. 02/09/14  Yes [name], DO  levothyroxine (SYNTHROID, LEVOTHROID) 25 MCG tablet Take 25 mcg by mouth daily before breakfast.   Yes Historical Provider, MD  montelukast (SINGULAIR) 10 MG tablet Take 10 mg by mouth every morning.    Yes Historical Provider, MD   Multiple Vitamin (DAILY VITAMIN PO) Take 1 tablet by mouth daily.   Yes Historical Provider, MD  polyethylene glycol (MIRALAX / GLYCOLAX) packet Take 17 g by mouth daily as needed for mild constipation.   Yes Historical Provider, MD  solifenacin (VESICARE) 5 MG tablet Take 5 mg by mouth daily.   Yes Historical Provider, MD  tamsulosin (FLOMAX) 0.4 MG CAPS capsule Take 1 capsule (0.4 mg total) by mouth daily. 02/08/14  Yes [name], DO  theophylline (UNIPHYL) 400 MG 24 hr tablet Take 400 mg by mouth daily.   Yes Historical Provider, MD  lactose free nutrition (BOOST PLUS) LIQD Take 237 mLs by mouth 3 (three) times daily with meals. 04/27/15   Silver Huguenin Elgergawy, MD  ondansetron (ZOFRAN ODT) 4 MG disintegrating tablet 4mg  ODT q4 hours prn nausea/vomit 07/03/15   Milton Ferguson, MD  sulfamethoxazole-trimethoprim (BACTRIM DS,SEPTRA DS) 800-160 MG tablet Take 1 tablet by mouth 2 (two) times daily. 7 day course starting on 07/02/2015 07/02/15   Historical Provider, MD     Allergies:  No Known Allergies  Social History:   reports that she has never smoked. She has never used smokeless tobacco. She reports that she does not drink alcohol or use illicit drugs.  Family History: Family History  Problem Relation Age of Onset  . Diabetes Mother      Physical Exam: Filed Vitals:   07/03/15 1630 07/03/15 1700 07/03/15 1807 07/03/15 1813  BP: 104/51 106/51  86/61  Pulse: 108 106  111  Temp:   102.3 F (39.1 C)   TempSrc:   Rectal   Resp: 21 24  23   Height:      Weight:      SpO2: 100% 100%  98%   Blood pressure 86/61, pulse 111, temperature 102.3 F (39.1 C), temperature source Rectal, resp. rate 23, height 5\' 3"  (1.6 m), weight 45.36 kg (100 lb), SpO2 98 %.  GEN:  Pleasant  patient lying in the stretcher in no acute distress; doesn't cooperate.  PSYCH:  alert but is confused. Does not appear anxious or depressed; affect is appropriate. HEENT: Mucous membranes pink and anicteric; PERRLA;  EOM intact; no cervical lymphadenopathy nor thyromegaly or carotid bruit; no JVD; There were no stridor. Neck is very supple. Breasts:: Not examined CHEST WALL: No tenderness CHEST: Normal respiration, clear to auscultation bilaterally.  HEART: Regular rate and rhythm.  There are no murmur, rub, or gallops.   BACK: No kyphosis or scoliosis; no CVA tenderness ABDOMEN: soft and non-tender; no masses, no organomegaly, normal abdominal bowel sounds; no pannus; no intertriginous candida. There is no rebound and no distention. Rectal Exam: Not done EXTREMITIES: No bone or joint deformity; age-appropriate arthropathy of the hands and knees; no edema; no ulcerations.  There is no calf tenderness. Genitalia: not examined PULSES: 2+  and symmetric SKIN: Normal hydration no rash or ulceration CNS: Cranial nerves 2-12 grossly intact no focal lateralizing neurologic deficit.  Speech is fluent; uvula elevated with phonation, facial symmetry and tongue midline. DTR are normal bilaterally, cerebella exam is intact, barbinski is negative and strengths are equaled bilaterally.  No sensory loss.   Labs on Admission:  Basic Metabolic Panel:  Recent Labs Lab 07/03/15 1155  NA 145  K 4.0  CL 107  CO2 22  GLUCOSE 167*  BUN 42*  CREATININE 1.25*  CALCIUM 9.5   Liver Function Tests:  Recent Labs Lab 07/03/15 1155  AST 52*  ALT 19  ALKPHOS 63  BILITOT 0.4  PROT 6.7  ALBUMIN 3.8    Recent Labs Lab 07/03/15 1155  LIPASE 22   CBC:  Recent Labs Lab 07/03/15 1159  WBC 10.5  NEUTROABS 9.4*  HGB 12.0  HCT 36.9  MCV 100.3*  PLT 164   Cardiac Enzymes:  Recent Labs Lab 07/03/15 1155  TROPONINI 0.05*   Radiological Exams on Admission: Ct Abdomen Pelvis Wo Contrast  07/03/2015  CLINICAL DATA:  Nausea, vomiting, and epigastric pain EXAM: CT ABDOMEN AND PELVIS WITHOUT CONTRAST TECHNIQUE: Multidetector CT imaging of the abdomen and pelvis was performed following the standard protocol  without IV contrast. COMPARISON:  06/09/2014 FINDINGS: Lower chest and abdominal wall: Airway thickening and occasional mucoid impaction in the lower lobes. Accounting for atelectasis, no pneumonia. Hepatobiliary: No focal liver abnormality.Cholecystectomy. Chronic common bile duct dilatation with indwelling stent from the lower common bile duct to the duodenum. No neighboring calcified choledocholithiasis. Similar CBD dilatation at 11 mm. Pancreas: Generalized atrophy. Spleen: Unremarkable. Adrenals/Urinary Tract: Negative adrenals. No hydronephrosis or stone. 14 mm cyst in the interpolar right kidney. Unremarkable bladder. Reproductive:Unremarkable for age. Stomach/Bowel: No obstruction or inflammatory wall thickening. Extensive colonic and small bowel diverticulosis. No active inflammation. No appendicitis. Vascular/Lymphatic: Extensive atherosclerotic calcification. No evidence of acute vascular disease. No mass or adenopathy. Peritoneal: No ascites or pneumoperitoneum. Musculoskeletal: No acute finding. Advanced lumbar facet arthropathy with multilevel listhesis and advanced disc degeneration. IMPRESSION: 1. No acute finding or change since 2016. No explanation for symptoms. 2. Chronic findings are described above. Electronically Signed   By: Monte Fantasia M.D.   On: 07/03/2015 16:18   Dg Abd Acute W/chest  07/03/2015  CLINICAL DATA:  Nausea, vomiting, epigastric pain beginning this morning. EXAM: DG ABDOMEN ACUTE W/ 1V CHEST COMPARISON:  05/07/2015 FINDINGS: Mild hyperinflation of the lungs compatible with COPD. Heart is normal size. No confluent opacities or effusions. Nonobstructive bowel gas pattern. Moderate stool burden in the colon. Biliary stents noted in the right upper quadrant, in similar position to prior CT. No free air organomegaly. Prior cholecystectomy. IMPRESSION: Moderate stool burden. No evidence of bowel obstruction or free air. COPD.  No active cardiopulmonary disease. Electronically  Signed   By: Rolm Baptise M.D.   On: 07/03/2015 12:38    Assessment/Plan Present on Admission:  . Fever of undetermined origin . Dementia . COPD (chronic obstructive pulmonary disease) ([facility])  PLAN:    Fever of unclear source:  I am not convinced she has a UTI, given her UA was rather negative.  She was given IV Rocephin in the ER, and we will switch it to IV Levaquin.   WIll admit her OBS and obtain a set of blood culture, and await urine culture.  I will continue with her home meds.  She is a full code, and will be admitted to telemetry under [facility] service.  Given her tachycardia, will hold or d/c her theophyllin.   This drug has a low therapeutic index anyhow, and if possible, will d/c it.     Other plans as per orders.  Code Status: FULL Haskel Khan, MD. FACP [facility] Pager [phone] 7pm to 7am.  07/03/2015, 6:56 PM

## 2015-07-03 NOTE — ED Notes (Signed)
Unable to get ekg at present.  Pt moving all over the bed.

## 2015-07-03 NOTE — ED Notes (Signed)
Pt temp of 102.3 rectally upon discharge vitals. RN aware.

## 2015-07-03 NOTE — ED Provider Notes (Signed)
Pt with bacteruria and has recently started bactrim.  Pt had vomiting but improved now.  Will rx zofran and follow up with pcp.  Ct abd neg  Milton Ferguson, MD 07/03/15 1729

## 2015-07-03 NOTE — Discharge Instructions (Signed)
Return if vomiting returns

## 2015-07-04 ENCOUNTER — Encounter (HOSPITAL_COMMUNITY): Payer: Self-pay | Admitting: *Deleted

## 2015-07-04 LAB — COMPREHENSIVE METABOLIC PANEL
ALBUMIN: 3.1 g/dL — AB (ref 3.5–5.0)
ALT: 63 U/L — ABNORMAL HIGH (ref 14–54)
ANION GAP: 10 (ref 5–15)
AST: 116 U/L — ABNORMAL HIGH (ref 15–41)
Alkaline Phosphatase: 79 U/L (ref 38–126)
BUN: 47 mg/dL — ABNORMAL HIGH (ref 6–20)
CALCIUM: 8.3 mg/dL — AB (ref 8.9–10.3)
CHLORIDE: 110 mmol/L (ref 101–111)
CO2: 22 mmol/L (ref 22–32)
Creatinine, Ser: 1.67 mg/dL — ABNORMAL HIGH (ref 0.44–1.00)
GFR calc non Af Amer: 24 mL/min — ABNORMAL LOW (ref >60)
GFR, EST AFRICAN AMERICAN: 28 mL/min — AB (ref >60)
Glucose, Bld: 82 mg/dL (ref 65–99)
POTASSIUM: 5.3 mmol/L — AB (ref 3.5–5.1)
SODIUM: 142 mmol/L (ref 135–145)
Total Bilirubin: 0.4 mg/dL (ref 0.3–1.2)
Total Protein: 6 g/dL — ABNORMAL LOW (ref 6.5–8.1)

## 2015-07-04 LAB — MRSA PCR SCREENING: MRSA by PCR: POSITIVE — AB

## 2015-07-04 LAB — CBC
HCT: 36.4 % (ref 36.0–46.0)
Hemoglobin: 12.2 g/dL (ref 12.0–15.0)
MCH: 32.6 pg (ref 26.0–34.0)
MCHC: 33.5 g/dL (ref 30.0–36.0)
MCV: 97.3 fL (ref 78.0–100.0)
PLATELETS: 134 10*3/uL — AB (ref 150–400)
RBC: 3.74 MIL/uL — ABNORMAL LOW (ref 3.87–5.11)
RDW: 15.2 % (ref 11.5–15.5)
WBC: 18.8 10*3/uL — ABNORMAL HIGH (ref 4.0–10.5)

## 2015-07-04 MED ORDER — MUPIROCIN 2 % EX OINT
1.0000 "application " | TOPICAL_OINTMENT | Freq: Two times a day (BID) | CUTANEOUS | Status: DC
  Administered 2015-07-04 – 2015-07-06 (×6): 1 via NASAL
  Filled 2015-07-04: qty 22

## 2015-07-04 MED ORDER — CHLORHEXIDINE GLUCONATE CLOTH 2 % EX PADS
6.0000 | MEDICATED_PAD | Freq: Every day | CUTANEOUS | Status: DC
Start: 2015-07-04 — End: 2015-07-06
  Administered 2015-07-04 – 2015-07-06 (×3): 6 via TOPICAL

## 2015-07-04 MED ORDER — LORAZEPAM 2 MG/ML IJ SOLN
0.5000 mg | Freq: Once | INTRAMUSCULAR | Status: AC
  Administered 2015-07-04: 0.5 mg via INTRAVENOUS
  Filled 2015-07-04: qty 1

## 2015-07-04 MED ORDER — LEVOFLOXACIN IN D5W 500 MG/100ML IV SOLN: 500.0000 mg | INTRAVENOUS | Status: DC

## 2015-07-04 NOTE — Care Management Obs Status (Signed)
[place] NOTIFICATION   Patient Details  Name: TAMIKA THUN MRN: [id number] Date of Birth: [birth year]   Medicare Observation Status Notification Given:  Yes    Alvie Heidelberg, RN 07/04/2015, 10:38 AM

## 2015-07-04 NOTE — Progress Notes (Signed)
[facility] PROGRESS NOTE  [name] [id number] DOB: 30-May-1912    PCP:   [name], MD   HPI: [name] is an 82 y.o. female  With hx of dementia, SNF resident, spinal stenosis, hx of gastritis, asthma, admitted for fever of unclear source, started on IV Levoquin, doing better.  Her urine culture grew GNR, BC is NTD.    Rewiew of Systems:  Unable.  Baseline confusion.     Past Medical History  Diagnosis Date  . Urinary retention   . Gastritis   . Esophageal ulcer   . Salmonella sepsis ([place])   . Degenerative disk disease   . Spinal stenosis   . Asthma   . COPD (chronic obstructive pulmonary disease) ([place])   . Diabetes mellitus, type 2 ([place])   . Diverticulosis of colon   . GERD (gastroesophageal reflux disease)   . Hypertension   . Osteoarthritis   . Hiatal hernia   . Pancreatitis   . S/P endoscopy 2009    Dr. Oneida Alar: gastritis  . Diverticulitis   . S/P endoscopy March 2012    Dr. Gala Romney: NSAID induced ulcer  . Hypothyroidism   . MGUS (monoclonal gammopathy of unknown significance)   . Stroke ([place])   . Dementia   . Renal disorder     Past Surgical History  Procedure Laterality Date  . Cervical fusion    . Cholecystectomy    . Ercp N/A 02/02/2014    Procedure: ENDOSCOPIC RETROGRADE CHOLANGIOPANCREATOGRAPHY (ERCP), STONE BASKET EXTRACTION;  Surgeon: Daneil Dolin, MD;  Location: [facility];  Service: Endoscopy;  Laterality: N/A;  . Sphincterotomy N/A 02/02/2014    Procedure: SPHINCTEROTOMY;  Surgeon: Daneil Dolin, MD;  Location: [facility];  Service: Endoscopy;  Laterality: N/A;  . Balloon dilation N/A 02/02/2014    Procedure: BALLOON DILATION;  Surgeon: Daneil Dolin, MD;  Location: [facility];  Service: Endoscopy;  Laterality: N/A;  . Biliary stent placement N/A 02/02/2014    Procedure: BILIARY STENT PLACEMENT;  Surgeon: Daneil Dolin, MD;  Location: [facility];  Service: Endoscopy;  Laterality: N/A;  . Spyglass cholangioscopy N/A  02/02/2014    Procedure: [id number] CHOLANGIOSCOPY;  Surgeon: Daneil Dolin, MD;  Location: [facility];  Service: Endoscopy;  Laterality: N/A;  . Esophagogastroduodenoscopy N/A 02/05/2014    Procedure: ESOPHAGOGASTRODUODENOSCOPY (EGD);  Surgeon: [name], MD;  [facility];  Service: Endoscopy;  Laterality: N/A;    Medications:  HOME MEDS: Prior to Admission medications   Medication Sig Start Date End Date Taking? Authorizing Provider  acetaminophen (TYLENOL) 325 MG tablet Take 2 tablets (650 mg total) by mouth every 6 (six) hours as needed for mild pain (or Fever >/= 101). 04/27/15  Yes Albertine Patricia, MD  aspirin 81 MG tablet Take 81 mg by mouth daily.   Yes Historical Provider, MD  Calcium Citrate-Vitamin D 200-250 MG-UNIT TABS Take 1 tablet by mouth daily.   Yes Historical Provider, MD  carbamide peroxide (EARWAX TREATMENT DROPS) 6.5 % otic solution Place 5 drops into both ears 2 (two) times daily as needed (build up).   Yes Historical Provider, MD  levETIRAcetam (KEPPRA) 250 MG tablet Take 1 tablet (250 mg total) by mouth daily. 02/09/14  Yes [name], DO  levothyroxine (SYNTHROID, LEVOTHROID) 25 MCG tablet Take 25 mcg by mouth daily before breakfast.   Yes Historical Provider, MD  montelukast (SINGULAIR) 10 MG tablet Take 10 mg by mouth every morning.    Yes  Historical Provider, MD  Multiple Vitamin (DAILY VITAMIN PO) Take 1 tablet by mouth daily.   Yes Historical Provider, MD  polyethylene glycol (MIRALAX / GLYCOLAX) packet Take 17 g by mouth daily as needed for mild constipation.   Yes Historical Provider, MD  solifenacin (VESICARE) 5 MG tablet Take 5 mg by mouth daily.   Yes Historical Provider, MD  tamsulosin (FLOMAX) 0.4 MG CAPS capsule Take 1 capsule (0.4 mg total) by mouth daily. 02/08/14  Yes [name], DO  theophylline (UNIPHYL) 400 MG 24 hr tablet Take 400 mg by mouth daily.   Yes Historical Provider, MD  lactose free nutrition (BOOST PLUS) LIQD Take 237  mLs by mouth 3 (three) times daily with meals. 04/27/15   Silver Huguenin Elgergawy, MD  ondansetron (ZOFRAN ODT) 4 MG disintegrating tablet 4mg  ODT q4 hours prn nausea/vomit 07/03/15   Milton Ferguson, MD  sulfamethoxazole-trimethoprim (BACTRIM DS,SEPTRA DS) 800-160 MG tablet Take 1 tablet by mouth 2 (two) times daily. 7 day course starting on 07/02/2015 07/02/15   Historical Provider, MD     Allergies:  No Known Allergies  Social History:   reports that she has never smoked. She has never used smokeless tobacco. She reports that she does not drink alcohol or use illicit drugs.  Family History: Family History  Problem Relation Age of Onset  . Diabetes Mother      Physical Exam: Filed Vitals:   07/03/15 2000 07/03/15 2047 07/04/15 0517 07/04/15 1551  BP:  114/69 110/54 99/45  Pulse: 108 100 97 92  Temp:  99.1 F (37.3 C) 98.9 F (37.2 C) 99.4 F (37.4 C)  TempSrc:  Axillary Axillary Oral  Resp: 16 16 16    Height:  5\' 3"  (1.6 m)    Weight:  47.083 kg (103 lb 12.8 oz)    SpO2: 94% 98% 97%    Blood pressure 99/45, pulse 92, temperature 99.4 F (37.4 C), temperature source Oral, resp. rate 16, height 5\' 3"  (1.6 m), weight 47.083 kg (103 lb 12.8 oz), SpO2 97 %.  GEN:  Pleasant  patient lying in the stretcher in no acute distress; cooperative with exam. PSYCH:  Confused.  Non verbal. does not appear anxious or depressed; affect is appropriate. HEENT: Mucous membranes pink and anicteric; PERRLA; EOM intact; no cervical lymphadenopathy nor thyromegaly or carotid bruit; no JVD; There were no stridor. Neck is very supple. Breasts:: Not examined CHEST WALL: No tenderness CHEST: Normal respiration, clear to auscultation bilaterally.  HEART: Regular rate and rhythm.  There are no murmur, rub, or gallops.   BACK: No kyphosis or scoliosis; no CVA tenderness ABDOMEN: soft and non-tender; no masses, no organomegaly, normal abdominal bowel sounds; no pannus; no intertriginous candida. There is no  rebound and no distention. Rectal Exam: Not done EXTREMITIES: No bone or joint deformity; age-appropriate arthropathy of the hands and knees; no edema; no ulcerations.  There is no calf tenderness. Genitalia: not examined PULSES: 2+ and symmetric SKIN: Normal hydration no rash or ulceration CNS: Cranial nerves 2-12 grossly intact no focal lateralizing neurologic deficit.  Speech is fluent; uvula elevated with phonation, facial symmetry and tongue midline. DTR are normal bilaterally, cerebella exam is intact, barbinski is negative and strengths are equaled bilaterally.  No sensory loss.   Labs on Admission:  Basic Metabolic Panel:  Recent Labs Lab 07/03/15 1155 07/04/15 0721  NA 145 142  K 4.0 5.3*  CL 107 110  CO2 22 22  GLUCOSE 167* 82  BUN 42* 47*  CREATININE  1.25* 1.67*  CALCIUM 9.5 8.3*   Liver Function Tests:  Recent Labs Lab 07/03/15 1155 07/04/15 0721  AST 52* 116*  ALT 19 63*  ALKPHOS 63 79  BILITOT 0.4 0.4  PROT 6.7 6.0*  ALBUMIN 3.8 3.1*    Recent Labs Lab 07/03/15 1155  LIPASE 22   CBC:  Recent Labs Lab 07/03/15 1159 07/04/15 0721  WBC 10.5 18.8*  NEUTROABS 9.4*  --   HGB 12.0 12.2  HCT 36.9 36.4  MCV 100.3* 97.3  PLT 164 134*   Cardiac Enzymes:  Recent Labs Lab 07/03/15 1155  TROPONINI 0.05*     Radiological Exams on Admission: Ct Abdomen Pelvis Wo Contrast  07/03/2015  CLINICAL DATA:  Nausea, vomiting, and epigastric pain EXAM: CT ABDOMEN AND PELVIS WITHOUT CONTRAST TECHNIQUE: Multidetector CT imaging of the abdomen and pelvis was performed following the standard protocol without IV contrast. COMPARISON:  06/09/2014 FINDINGS: Lower chest and abdominal wall: Airway thickening and occasional mucoid impaction in the lower lobes. Accounting for atelectasis, no pneumonia. Hepatobiliary: No focal liver abnormality.Cholecystectomy. Chronic common bile duct dilatation with indwelling stent from the lower common bile duct to the duodenum. No  neighboring calcified choledocholithiasis. Similar CBD dilatation at 11 mm. Pancreas: Generalized atrophy. Spleen: Unremarkable. Adrenals/Urinary Tract: Negative adrenals. No hydronephrosis or stone. 14 mm cyst in the interpolar right kidney. Unremarkable bladder. Reproductive:Unremarkable for age. Stomach/Bowel: No obstruction or inflammatory wall thickening. Extensive colonic and small bowel diverticulosis. No active inflammation. No appendicitis. Vascular/Lymphatic: Extensive atherosclerotic calcification. No evidence of acute vascular disease. No mass or adenopathy. Peritoneal: No ascites or pneumoperitoneum. Musculoskeletal: No acute finding. Advanced lumbar facet arthropathy with multilevel listhesis and advanced disc degeneration. IMPRESSION: 1. No acute finding or change since 2016. No explanation for symptoms. 2. Chronic findings are described above. Electronically Signed   By: Monte Fantasia M.D.   On: 07/03/2015 16:18   Dg Abd Acute W/chest  07/03/2015  CLINICAL DATA:  Nausea, vomiting, epigastric pain beginning this morning. EXAM: DG ABDOMEN ACUTE W/ 1V CHEST COMPARISON:  05/07/2015 FINDINGS: Mild hyperinflation of the lungs compatible with COPD. Heart is normal size. No confluent opacities or effusions. Nonobstructive bowel gas pattern. Moderate stool burden in the colon. Biliary stents noted in the right upper quadrant, in similar position to prior CT. No free air organomegaly. Prior cholecystectomy. IMPRESSION: Moderate stool burden. No evidence of bowel obstruction or free air. COPD.  No active cardiopulmonary disease. Electronically Signed   By: Rolm Baptise M.D.   On: 07/03/2015 12:38   Assessment/Plan Present on Admission:  . Fever of undetermined origin . Dementia . COPD (chronic obstructive pulmonary disease) (HCC)  PLAN:  Fever of unclear source, possibly UTI.  Urine culture grew GNR, and she is on Levoquin.   Will continue.  BC NGTD.  Will follow.  Dementia:  Advanced, stable.    COPD:  No SOB.  Current Tx.    Code Status: FULL Haskel Khan, MD.  FACP [facility] Pager [phone] 7pm to 7am.  07/04/2015, 5:07 PM

## 2015-07-05 DIAGNOSIS — E039 Hypothyroidism, unspecified: Secondary | ICD-10-CM | POA: Diagnosis present

## 2015-07-05 DIAGNOSIS — Z8719 Personal history of other diseases of the digestive system: Secondary | ICD-10-CM | POA: Diagnosis not present

## 2015-07-05 DIAGNOSIS — R509 Fever, unspecified: Secondary | ICD-10-CM | POA: Diagnosis not present

## 2015-07-05 DIAGNOSIS — J449 Chronic obstructive pulmonary disease, unspecified: Secondary | ICD-10-CM | POA: Diagnosis present

## 2015-07-05 DIAGNOSIS — I10 Essential (primary) hypertension: Secondary | ICD-10-CM | POA: Diagnosis present

## 2015-07-05 DIAGNOSIS — K219 Gastro-esophageal reflux disease without esophagitis: Secondary | ICD-10-CM | POA: Diagnosis present

## 2015-07-05 DIAGNOSIS — F0390 Unspecified dementia without behavioral disturbance: Secondary | ICD-10-CM | POA: Diagnosis present

## 2015-07-05 DIAGNOSIS — N390 Urinary tract infection, site not specified: Secondary | ICD-10-CM | POA: Diagnosis present

## 2015-07-05 DIAGNOSIS — Z7982 Long term (current) use of aspirin: Secondary | ICD-10-CM | POA: Diagnosis not present

## 2015-07-05 DIAGNOSIS — J45909 Unspecified asthma, uncomplicated: Secondary | ICD-10-CM | POA: Diagnosis present

## 2015-07-05 DIAGNOSIS — J438 Other emphysema: Secondary | ICD-10-CM | POA: Diagnosis not present

## 2015-07-05 DIAGNOSIS — E119 Type 2 diabetes mellitus without complications: Secondary | ICD-10-CM | POA: Diagnosis present

## 2015-07-05 DIAGNOSIS — Z66 Do not resuscitate: Secondary | ICD-10-CM | POA: Diagnosis present

## 2015-07-05 DIAGNOSIS — B961 Klebsiella pneumoniae [K. pneumoniae] as the cause of diseases classified elsewhere: Secondary | ICD-10-CM | POA: Diagnosis present

## 2015-07-05 DIAGNOSIS — Z833 Family history of diabetes mellitus: Secondary | ICD-10-CM | POA: Diagnosis not present

## 2015-07-05 DIAGNOSIS — Z8673 Personal history of transient ischemic attack (TIA), and cerebral infarction without residual deficits: Secondary | ICD-10-CM | POA: Diagnosis not present

## 2015-07-05 LAB — URINE CULTURE: Culture: >=100000

## 2015-07-05 MED ORDER — CIPROFLOXACIN IN D5W 400 MG/200ML IV SOLN
400.0000 mg | INTRAVENOUS | Status: DC
  Administered 2015-07-05: 400 mg via INTRAVENOUS
  Filled 2015-07-05: qty 200

## 2015-07-05 MED ORDER — DEXTROSE-NACL 5-0.9 % IV SOLN
INTRAVENOUS | Status: DC
  Administered 2015-07-05 – 2015-07-06 (×2): via INTRAVENOUS

## 2015-07-05 NOTE — NC FL2 (Addendum)
[place] LEVEL OF CARE SCREENING TOOL     IDENTIFICATION  Patient Name: [name] Birthdate: 02-23-1913 Sex: female Admission Date (Current Location): 07/03/2015  [place] and [place] Number:  [place] [id number] [place] and Address:  [facility] [address] [place]      Provider Number: [id number]  Attending Physician Name and Address:  Orvan Falconer, MD  Relative Name and Phone Number:       Current Level of Care:   Recommended Level of Care: [place] Prior Approval Number:    Date Approved/Denied:   PASRR Number:    Discharge Plan: Other (Comment)    Current Diagnoses: Patient Active Problem List   Diagnosis Date Noted  . Fever of undetermined origin 07/03/2015  . Acute renal failure (ARF) ([place]) 04/25/2015  . Diarrhea 07/26/2014  . Enteritis due to Clostridium difficile 07/26/2014  . Acute kidney injury ([place]) 07/26/2014  . Dementia 07/26/2014  . Nausea with vomiting 07/26/2014  . Dehydration 07/25/2014  . Unspecified cerebral artery occlusion with cerebral infarction 02/08/2014  . Acute encephalopathy 02/06/2014  . Protein-calorie malnutrition, severe ([place]) 02/06/2014  . Urinary retention 02/05/2014  . Cholangitis 02/01/2014  . Cholelithiasis with biliary obstruction 02/01/2014  . Biliary obstruction 02/01/2014  . MGUS (monoclonal gammopathy of unknown significance) 08/28/2013  . PUD (peptic ulcer disease) 08/15/2010  . Diabetes ([place]) 12/28/2009  . Essential hypertension 12/28/2009  . ASTHMA 12/28/2009  . COPD (chronic obstructive pulmonary disease) ([place]) 12/28/2009  . GERD 12/28/2009  . DIVERTICULOSIS, COLON 12/28/2009  . OSTEOARTHRITIS 12/28/2009  . PANCREATITIS, HX OF 12/28/2009    Orientation RESPIRATION BLADDER Height & Weight     Self  O2 (2L) Incontinent Weight: 103 lb 12.8 oz (47.083 kg) Height:  5\' 3"  (160 cm)  BEHAVIORAL SYMPTOMS/MOOD NEUROLOGICAL BOWEL NUTRITION STATUS   Other (Comment)   Incontinent Diet (CARB MOD)  AMBULATORY STATUS COMMUNICATION OF NEEDS Skin     Verbally Normal                       Personal Care Assistance Level of Assistance  Bathing, Feeding, Dressing           Functional Limitations Info  Sight, Hearing, Speech   Hearing Info: Adequate Speech Info: Adequate    SPECIAL CARE FACTORS FREQUENCY  PT (By licensed PT), Speech therapy                    Contractures      Additional Factors Info  Code Status, Allergies Code Status Info: Full Code Allergies Info: NKDA           Current Medications (07/05/2015):  This is the current hospital active medication list Current Facility-Administered Medications  Medication Dose Route Frequency Provider Last Rate Last Dose  . 0.9 %  sodium chloride infusion   Intravenous Continuous Orvan Falconer, MD 50 mL/hr at 07/04/15 2309    . acetaminophen (TYLENOL) tablet 650 mg  650 mg Oral Q6H PRN Orvan Falconer, MD   650 mg at 07/05/15 0157  . aspirin chewable tablet 81 mg  81 mg Oral Daily Orvan Falconer, MD   81 mg at 07/05/15 1026  . Chlorhexidine Gluconate Cloth 2 % PADS 6 each  6 each Topical Q0600 Oswald Hillock, MD   6 each at 07/05/15 0546  . feeding supplement (BOOST / RESOURCE BREEZE) liquid 1 Container  240 mL Oral TID WC Orvan Falconer, MD   1 Container  at 07/05/15 1025  . heparin injection 5,000 Units  5,000 Units Subcutaneous 3 times per day Orvan Falconer, MD   5,000 Units at 07/05/15 0547  . levETIRAcetam (KEPPRA) tablet 250 mg  250 mg Oral Daily Orvan Falconer, MD   250 mg at 07/05/15 1026  . levofloxacin (LEVAQUIN) IVPB 500 mg  500 mg Intravenous Q48H Orvan Falconer, MD      . levothyroxine (SYNTHROID, LEVOTHROID) tablet 25 mcg  25 mcg Oral QAC breakfast Orvan Falconer, MD   25 mcg at 07/05/15 1026  . multivitamin with minerals tablet 1 tablet  1 tablet Oral Daily Orvan Falconer, MD   1 tablet at 07/05/15 1026  . mupirocin ointment (BACTROBAN) 2 % 1 application  1 application Nasal BID Oswald Hillock, MD   1  application at [id number] 1027  . polyethylene glycol (MIRALAX / GLYCOLAX) packet 17 g  17 g Oral Daily PRN Orvan Falconer, MD      . sodium chloride flush (NS) 0.9 % injection 3 mL  3 mL Intravenous Q12H Orvan Falconer, MD   3 mL at 07/03/15 2200     Discharge Medications:    Current Discharge Medication List    START taking these medications   Details  ciprofloxacin (CIPRO) 500 MG tablet Take 1 tablet (500 mg total) by mouth 2 (two) times daily. Qty: 10 tablet, Refills: 0      CONTINUE these medications which have NOT CHANGED   Details  acetaminophen (TYLENOL) 325 MG tablet Take 2 tablets (650 mg total) by mouth every 6 (six) hours as needed for mild pain (or Fever >/= 101).    aspirin 81 MG tablet Take 81 mg by mouth daily.    Calcium Citrate-Vitamin D 200-250 MG-UNIT TABS Take 1 tablet by mouth daily.    carbamide peroxide (EARWAX TREATMENT DROPS) 6.5 % otic solution Place 5 drops into both ears 2 (two) times daily as needed (build up).    levETIRAcetam (KEPPRA) 250 MG tablet Take 1 tablet (250 mg total) by mouth daily. Qty: 30 tablet, Refills: 0    levothyroxine (SYNTHROID, LEVOTHROID) 25 MCG tablet Take 25 mcg by mouth daily before breakfast.    montelukast (SINGULAIR) 10 MG tablet Take 10 mg by mouth every morning.     polyethylene glycol (MIRALAX / GLYCOLAX) packet Take 17 g by mouth daily as needed for mild constipation.    solifenacin (VESICARE) 5 MG tablet Take 5 mg by mouth daily.    tamsulosin (FLOMAX) 0.4 MG CAPS capsule Take 1 capsule (0.4 mg total) by mouth daily. Qty: 30 capsule, Refills: 0    theophylline (UNIPHYL) 400 MG 24 hr tablet Take 400 mg by mouth daily.    lactose free nutrition (BOOST PLUS) LIQD Take 237 mLs by mouth 3 (three) times daily with meals. Qty: 12 Can, Refills: 0      STOP taking these medications     Multiple Vitamin (DAILY VITAMIN PO)      sulfamethoxazole-trimethoprim (BACTRIM  DS,SEPTRA DS) 800-160 MG tablet        No Known Allergies       Please see discharge summary for a list of discharge medications.  Relevant Imaging Results:  Relevant Lab Results:   Additional Information Legal guardianNetta Cedars [phone]  Ludwig Clarks, [place]

## 2015-07-05 NOTE — Progress Notes (Signed)
I called family this am, but wasn't able to connect. I was able to speak with Pamala Hurry her daughter just now, and told her we are tx her Klebsiella UTI, and I planned to discharge her back to SNF tomorrow.  I asked her about the code status, and she confirmed that patient is a DNR.  I will honor their wishes, and will enter a DNR order.  Thanks.  Orvan Falconer, MD.  Rosalita Chessman. Hospitalist.

## 2015-07-05 NOTE — Progress Notes (Signed)
[facility] PROGRESS NOTE  TERRENCIA ENGEL [id number] DOB: 01-Sep-1912    PCP:   [name], MD   HPI:   [name] is an 82 y.o. female With hx of dementia, SNF resident, spinal stenosis, hx of gastritis, asthma, admitted for fever of unclear source, started on IV Levoquin, doing better. Her urine culture grew Klebsiella sensitive to Cipro.  She has not been responding, very lethargic.      Rewiew of Systems: Unable.   Past Medical History  Diagnosis Date  . Urinary retention   . Gastritis   . Esophageal ulcer   . Salmonella sepsis ([place])   . Degenerative disk disease   . Spinal stenosis   . Asthma   . COPD (chronic obstructive pulmonary disease) ([place])   . Diabetes mellitus, type 2 ([place])   . Diverticulosis of colon   . GERD (gastroesophageal reflux disease)   . Hypertension   . Osteoarthritis   . Hiatal hernia   . Pancreatitis   . S/P endoscopy 2009    Dr. Oneida Alar: gastritis  . Diverticulitis   . S/P endoscopy March 2012    Dr. Gala Romney: NSAID induced ulcer  . Hypothyroidism   . MGUS (monoclonal gammopathy of unknown significance)   . Stroke ([place])   . Dementia   . Renal disorder     Past Surgical History  Procedure Laterality Date  . Cervical fusion    . Cholecystectomy    . Ercp N/A 02/02/2014    Procedure: ENDOSCOPIC RETROGRADE CHOLANGIOPANCREATOGRAPHY (ERCP), STONE BASKET EXTRACTION;  Surgeon: Daneil Dolin, MD;  Location: [facility];  Service: Endoscopy;  Laterality: N/A;  . Sphincterotomy N/A 02/02/2014    Procedure: SPHINCTEROTOMY;  Surgeon: Daneil Dolin, MD;  Location: [facility];  Service: Endoscopy;  Laterality: N/A;  . Balloon dilation N/A 02/02/2014    Procedure: BALLOON DILATION;  Surgeon: Daneil Dolin, MD;  Location: [facility];  Service: Endoscopy;  Laterality: N/A;  . Biliary stent placement N/A 02/02/2014    Procedure: BILIARY STENT PLACEMENT;  Surgeon: Daneil Dolin, MD;  Location: [facility];  Service: Endoscopy;  Laterality: N/A;   . Spyglass cholangioscopy N/A 02/02/2014    Procedure: [id number] CHOLANGIOSCOPY;  Surgeon: Daneil Dolin, MD;  Location: [facility];  Service: Endoscopy;  Laterality: N/A;  . Esophagogastroduodenoscopy N/A 02/05/2014    Procedure: ESOPHAGOGASTRODUODENOSCOPY (EGD);  Surgeon: [name], MD;  [facility];  Service: Endoscopy;  Laterality: N/A;    Medications:  HOME MEDS: Prior to Admission medications   Medication Sig Start Date End Date Taking? Authorizing Provider  acetaminophen (TYLENOL) 325 MG tablet Take 2 tablets (650 mg total) by mouth every 6 (six) hours as needed for mild pain (or Fever >/= 101). 04/27/15  Yes Albertine Patricia, MD  aspirin 81 MG tablet Take 81 mg by mouth daily.   Yes Historical Provider, MD  Calcium Citrate-Vitamin D 200-250 MG-UNIT TABS Take 1 tablet by mouth daily.   Yes Historical Provider, MD  carbamide peroxide (EARWAX TREATMENT DROPS) 6.5 % otic solution Place 5 drops into both ears 2 (two) times daily as needed (build up).   Yes Historical Provider, MD  levETIRAcetam (KEPPRA) 250 MG tablet Take 1 tablet (250 mg total) by mouth daily. 02/09/14  Yes [name], DO  levothyroxine (SYNTHROID, LEVOTHROID) 25 MCG tablet Take 25 mcg by mouth daily before breakfast.   Yes Historical Provider, MD  montelukast (SINGULAIR) 10 MG tablet Take 10 mg by mouth every morning.  Yes Historical Provider, MD  Multiple Vitamin (DAILY VITAMIN PO) Take 1 tablet by mouth daily.   Yes Historical Provider, MD  polyethylene glycol (MIRALAX / GLYCOLAX) packet Take 17 g by mouth daily as needed for mild constipation.   Yes Historical Provider, MD  solifenacin (VESICARE) 5 MG tablet Take 5 mg by mouth daily.   Yes Historical Provider, MD  tamsulosin (FLOMAX) 0.4 MG CAPS capsule Take 1 capsule (0.4 mg total) by mouth daily. 02/08/14  Yes [name], DO  theophylline (UNIPHYL) 400 MG 24 hr tablet Take 400 mg by mouth daily.   Yes Historical Provider, MD  lactose free  nutrition (BOOST PLUS) LIQD Take 237 mLs by mouth 3 (three) times daily with meals. 04/27/15   Albertine Patricia, MD  ondansetron (ZOFRAN ODT) 4 MG disintegrating tablet 4mg  ODT q4 hours prn nausea/vomit 07/03/15   Milton Ferguson, MD  sulfamethoxazole-trimethoprim (BACTRIM DS,SEPTRA DS) 800-160 MG tablet Take 1 tablet by mouth 2 (two) times daily. 7 day course starting on 07/02/2015 07/02/15   Historical Provider, MD     Allergies:  No Known Allergies  Social History:   reports that she has never smoked. She has never used smokeless tobacco. She reports that she does not drink alcohol or use illicit drugs.  Family History: Family History  Problem Relation Age of Onset  . Diabetes Mother      Physical Exam: Filed Vitals:   07/05/15 0144 07/05/15 0328 07/05/15 0600 07/05/15 1358  BP:   115/54 121/75  Pulse:   85 79  Temp: 101.2 F (38.4 C) 98.3 F (36.8 C) 97.7 F (36.5 C) 98 F (36.7 C)  TempSrc: Oral Axillary Oral Axillary  Resp:    16  Height:      Weight:      SpO2:   100% 100%   Blood pressure 121/75, pulse 79, temperature 98 F (36.7 C), temperature source Axillary, resp. rate 16, height 5\' 3"  (1.6 m), weight 47.083 kg (103 lb 12.8 oz), SpO2 100 %.  GEN:  Not responding.  HEENT: Mucous membranes pink and anicteric; PERRLA; EOM intact; no cervical lymphadenopathy nor thyromegaly or carotid bruit; no JVD; There were no stridor. Neck is very supple. Breasts:: Not examined CHEST WALL: No tenderness CHEST: Normal respiration, clear to auscultation bilaterally.  HEART: Regular rate and rhythm.  There are no murmur, rub, or gallops.   BACK: No kyphosis or scoliosis; no CVA tenderness ABDOMEN: soft and non-tender; no masses, no organomegaly, normal abdominal bowel sounds; no pannus; no intertriginous candida. There is no rebound and no distention. Rectal Exam: Not done EXTREMITIES: No bone or joint deformity; age-appropriate arthropathy of the hands and knees; no edema; no  ulcerations.  There is no calf tenderness. Genitalia: not examined PULSES: 2+ and symmetric SKIN: Normal hydration no rash or ulceration CNS:  Minimal responsiveness.    Labs on Admission:  Basic Metabolic Panel:  Recent Labs Lab 07/03/15 1155 07/04/15 0721  NA 145 142  K 4.0 5.3*  CL 107 110  CO2 22 22  GLUCOSE 167* 82  BUN 42* 47*  CREATININE 1.25* 1.67*  CALCIUM 9.5 8.3*   Liver Function Tests:  Recent Labs Lab 07/03/15 1155 07/04/15 0721  AST 52* 116*  ALT 19 63*  ALKPHOS 63 79  BILITOT 0.4 0.4  PROT 6.7 6.0*  ALBUMIN 3.8 3.1*    Recent Labs Lab 07/03/15 1155  LIPASE 22   No results for input(s): AMMONIA in the last 168 hours. CBC:  Recent  Labs Lab 07/03/15 1159 07/04/15 0721  WBC 10.5 18.8*  NEUTROABS 9.4*  --   HGB 12.0 12.2  HCT 36.9 36.4  MCV 100.3* 97.3  PLT 164 134*   Cardiac Enzymes:  Recent Labs Lab 07/03/15 1155  TROPONINI 0.05*   Assessment/Plan Present on Admission:  . Fever of undetermined origin . Dementia . COPD (chronic obstructive pulmonary disease) (HCC)   PLAN:  Fever of unclear source.  Given her urine growing Klebsiella sensitive to Cipro, will change Levoquin to IV Cipro.  Continue with current Tx.  Will plan to discharge her back to SNF tomorrow.  Her BCs have been negative to date.    Other plans as per orders.  Code Status: FULL Haskel Khan, MD.  FACP [facility] Pager [phone] 7pm to 7am.  07/05/2015, 4:21 PM

## 2015-07-05 NOTE — Clinical Social Work Note (Signed)
Clinical Social Work Assessment  Patient Details  Name: [name] MRN: [id number] Date of Birth: Sep 18, 1912  Date of referral:  07/05/15               Reason for consult:  Facility Placement                Permission sought to share information with:  Other Permission granted to share information::  No  Name::      (Legal Guardian= Netta Cedars [phone])  Agency::     Relationship::     Contact Information:     Housing/Transportation Living arrangements for the past 2 months:  [place] of Information:  Guardian Patient Interpreter Needed:  None Criminal Activity/Legal Involvement Pertinent to Current Situation/Hospitalization:  No - Comment as needed Significant Relationships:  Warehouse manager Lives with:  Facility Resident Do you feel safe going back to the place where you live?  Yes Need for family participation in patient care:  Yes (Comment)  Care giving concerns:  Patient admitted from [facility].   Social Worker assessment / plan:  CSW spoke with staff at [facility]- they anticipate her return at [employer]- stating she was beign treated for a UTI pta and may have become nauseated bc of that. Call into DSS guardian also-   Employment status:  Retired Nurse, adult PT Recommendations:  Not assessed at this time Information / Referral to community resources:  Other (Comment Required)  Patient/Family's Response to care:  Message left for DSS guardian  Patient/Family's Understanding of and Emotional Response to Diagnosis, Current Treatment, and Prognosis:  ALF aware of UTI and anticipate her return at dc.   Emotional Assessment Appearance:  Appears younger than stated age Attitude/Demeanor/Rapport:  Unable to Assess Affect (typically observed):  Accepting, Pleasant Orientation:  Oriented to Self Alcohol / Substance use:  Not Applicable Psych involvement (Current and /or in the community):  No  (Comment)  Discharge Needs  Concerns to be addressed:  No discharge needs identified Readmission within the last 30 days:  No Current discharge risk:  None Barriers to Discharge:  No Barriers Identified   Ludwig Clarks, LCSW 07/05/2015, 11:28 AM

## 2015-07-06 LAB — BASIC METABOLIC PANEL
Anion gap: 3 — ABNORMAL LOW (ref 5–15)
BUN: 30 mg/dL — ABNORMAL HIGH (ref 6–20)
CALCIUM: 8.1 mg/dL — AB (ref 8.9–10.3)
CO2: 22 mmol/L (ref 22–32)
CREATININE: 1.07 mg/dL — AB (ref 0.44–1.00)
Chloride: 117 mmol/L — ABNORMAL HIGH (ref 101–111)
GFR calc non Af Amer: 41 mL/min — ABNORMAL LOW (ref >60)
GFR, EST AFRICAN AMERICAN: 48 mL/min — AB (ref >60)
Glucose, Bld: 136 mg/dL — ABNORMAL HIGH (ref 65–99)
Potassium: 4.5 mmol/L (ref 3.5–5.1)
SODIUM: 142 mmol/L (ref 135–145)

## 2015-07-06 MED ORDER — CIPROFLOXACIN HCL 250 MG PO TABS: 500.0000 mg | ORAL_TABLET | Freq: Two times a day (BID) | ORAL | Status: DC

## 2015-07-06 MED ORDER — CIPROFLOXACIN HCL 500 MG PO TABS: 500.0000 mg | ORAL_TABLET | Freq: Two times a day (BID) | ORAL | Status: DC

## 2015-07-06 NOTE — Progress Notes (Signed)
Patient for d/c today to ALF bed at [facility] as pta. POA/DSS agreeable to this plan- plan transfer via EMS. Eduard Clos, MSW, [place]

## 2015-07-06 NOTE — Discharge Planning (Signed)
Pt discharged back to nursing facilty, paperwork sent with worker, no c/o pain, iv removed, vitals stable, no questions for the nurse

## 2015-07-06 NOTE — Discharge Summary (Signed)
Physician Discharge Summary  [name] [id number] DOB: 1913-03-20 DOA: 07/03/2015  PCP: [name], MD  Admit date: 07/03/2015 Discharge date: 07/06/2015  Time spent: 35 minutes  Recommendations for Outpatient Follow-up:  1. Follow up with PCP in one week.    Discharge Diagnoses:  Principal Problem:   Fever of undetermined origin Active Problems:   Diabetes ([place])   COPD (chronic obstructive pulmonary disease) ([place])   Dementia   Discharge Condition: improved.  No fever, alert, confused.   Diet recommendation: same diet as previously given.   Filed Weights   07/03/15 1131 07/03/15 2047  Weight: 45.36 kg (100 lb) 47.083 kg (103 lb 12.8 oz)    History of present illness: Patient was admitted by me for fever of unclear etiology, though suspicious for UTI, on Jul 03, 2015.  As per my previous H and P:  " [name] is an 82 y.o. female from [place], with a history of dementia, gastritis, esophageal ulcer, asthma, COPD, DM type 2, osteoarthritis, diverticulitis, and CVA, who presents to the Emergency as she had an episode of vomiting this am. No other details are known at arrival as pt is only stating "I am in pain" and no family available. Pt has h/o dementia which limits history at this time. Work up in the ER with abdominal CT showed COPD, and increase stool burden, but no free air, and no acute process. Her WBC was 10.5K, and her UA was borderline. She was recently started on Bactrim for presumed UTI, but unclear if she had taken them. She was going to be sent back, but she spiked a temp of 102 in the ER, and hospitalist was asked to admit her for possible UTI.   Hospital Course:  Patient has dementia, and she was not conversing, making ROS and history difficult.  She was given IV Rocephin in the ER, but it was changed to IV Levoquin, as I was not certain her source of fever was UTI.  Her BCs remained negative.  Her urine  culture, however, did grow greater than 100K colonies of Klebsiella, sensitive to Cipro.  She was promptly switch to IV Cipro, and improved.  She is more alert, but is still confused.   She was also felt to be volume depleted, and was given IVF.  She did not have persistent fever since admission.  I was able to speak to her daughter finally yesterday, gave her update, and ascertained her code status as DNR.  This will be honored.  She is agreeeable with plan to discharge her back to the SNF.  I will give her 5 more days of Cipro.  Thank you for allowing me to participate in her care.  Good Day.   Discharge Exam: Filed Vitals:   07/05/15 2033 07/06/15 0652  BP:  127/65  Pulse: 93 78  Temp: 98.6 F (37 C) 99.1 F (37.3 C)  Resp: 16 18   Discharge Instructions    Current Discharge Medication List    START taking these medications   Details  ciprofloxacin (CIPRO) 500 MG tablet Take 1 tablet (500 mg total) by mouth 2 (two) times daily. Qty: 10 tablet, Refills: 0      CONTINUE these medications which have NOT CHANGED   Details  acetaminophen (TYLENOL) 325 MG tablet Take 2 tablets (650 mg total) by mouth every 6 (six) hours as needed for mild pain (or Fever >/= 101).    aspirin 81 MG tablet Take  81 mg by mouth daily.    Calcium Citrate-Vitamin D 200-250 MG-UNIT TABS Take 1 tablet by mouth daily.    carbamide peroxide (EARWAX TREATMENT DROPS) 6.5 % otic solution Place 5 drops into both ears 2 (two) times daily as needed (build up).    levETIRAcetam (KEPPRA) 250 MG tablet Take 1 tablet (250 mg total) by mouth daily. Qty: 30 tablet, Refills: 0    levothyroxine (SYNTHROID, LEVOTHROID) 25 MCG tablet Take 25 mcg by mouth daily before breakfast.    montelukast (SINGULAIR) 10 MG tablet Take 10 mg by mouth every morning.     polyethylene glycol (MIRALAX / GLYCOLAX) packet Take 17 g by mouth daily as needed for mild constipation.    solifenacin (VESICARE) 5 MG tablet Take 5 mg by mouth  daily.    tamsulosin (FLOMAX) 0.4 MG CAPS capsule Take 1 capsule (0.4 mg total) by mouth daily. Qty: 30 capsule, Refills: 0    theophylline (UNIPHYL) 400 MG 24 hr tablet Take 400 mg by mouth daily.    lactose free nutrition (BOOST PLUS) LIQD Take 237 mLs by mouth 3 (three) times daily with meals. Qty: 12 Can, Refills: 0      STOP taking these medications     Multiple Vitamin (DAILY VITAMIN PO)      sulfamethoxazole-trimethoprim (BACTRIM DS,SEPTRA DS) 800-160 MG tablet        No Known Allergies    The results of significant diagnostics from this hospitalization (including imaging, microbiology, ancillary and laboratory) are listed below for reference.    Significant Diagnostic Studies: Ct Abdomen Pelvis Wo Contrast  07/03/2015  CLINICAL DATA:  Nausea, vomiting, and epigastric pain EXAM: CT ABDOMEN AND PELVIS WITHOUT CONTRAST TECHNIQUE: Multidetector CT imaging of the abdomen and pelvis was performed following the standard protocol without IV contrast. COMPARISON:  06/09/2014 FINDINGS: Lower chest and abdominal wall: Airway thickening and occasional mucoid impaction in the lower lobes. Accounting for atelectasis, no pneumonia. Hepatobiliary: No focal liver abnormality.Cholecystectomy. Chronic common bile duct dilatation with indwelling stent from the lower common bile duct to the duodenum. No neighboring calcified choledocholithiasis. Similar CBD dilatation at 11 mm. Pancreas: Generalized atrophy. Spleen: Unremarkable. Adrenals/Urinary Tract: Negative adrenals. No hydronephrosis or stone. 14 mm cyst in the interpolar right kidney. Unremarkable bladder. Reproductive:Unremarkable for age. Stomach/Bowel: No obstruction or inflammatory wall thickening. Extensive colonic and small bowel diverticulosis. No active inflammation. No appendicitis. Vascular/Lymphatic: Extensive atherosclerotic calcification. No evidence of acute vascular disease. No mass or adenopathy. Peritoneal: No ascites or  pneumoperitoneum. Musculoskeletal: No acute finding. Advanced lumbar facet arthropathy with multilevel listhesis and advanced disc degeneration. IMPRESSION: 1. No acute finding or change since 2016. No explanation for symptoms. 2. Chronic findings are described above. Electronically Signed   By: Monte Fantasia M.D.   On: 07/03/2015 16:18   Dg Abd Acute W/chest  07/03/2015  CLINICAL DATA:  Nausea, vomiting, epigastric pain beginning this morning. EXAM: DG ABDOMEN ACUTE W/ 1V CHEST COMPARISON:  05/07/2015 FINDINGS: Mild hyperinflation of the lungs compatible with COPD. Heart is normal size. No confluent opacities or effusions. Nonobstructive bowel gas pattern. Moderate stool burden in the colon. Biliary stents noted in the right upper quadrant, in similar position to prior CT. No free air organomegaly. Prior cholecystectomy. IMPRESSION: Moderate stool burden. No evidence of bowel obstruction or free air. COPD.  No active cardiopulmonary disease. Electronically Signed   By: Rolm Baptise M.D.   On: 07/03/2015 12:38    Microbiology: Recent Results (from the past 240 hour(s))  Urine culture  Status: None   Collection Time: 07/03/15 12:45 PM  Result Value Ref Range Status   Specimen Description URINE, CATHETERIZED  Final   Special Requests NONE  Final   Culture   Final    >=100,000 COLONIES/mL KLEBSIELLA PNEUMONIAE Performed at [facility]    Report Status 07/05/2015 FINAL  Final   Organism ID, Bacteria KLEBSIELLA PNEUMONIAE  Final      Susceptibility   Klebsiella pneumoniae - MIC*    AMPICILLIN >=32 RESISTANT Resistant     CEFAZOLIN <=4 SENSITIVE Sensitive     CEFTRIAXONE <=1 SENSITIVE Sensitive     CIPROFLOXACIN 0.5 SENSITIVE Sensitive     GENTAMICIN <=1 SENSITIVE Sensitive     IMIPENEM <=0.25 SENSITIVE Sensitive     NITROFURANTOIN 32 SENSITIVE Sensitive     TRIMETH/SULFA <=20 SENSITIVE Sensitive     AMPICILLIN/SULBACTAM >=32 RESISTANT Resistant     PIP/TAZO <=4 SENSITIVE  Sensitive     * >=100,000 COLONIES/mL KLEBSIELLA PNEUMONIAE  Culture, blood (Routine X 2) w Reflex to ID Panel     Status: None (Preliminary result)   Collection Time: 07/03/15  7:32 PM  Result Value Ref Range Status   Specimen Description BLOOD LEFT FOREARM  Final   Special Requests BOTTLES DRAWN AEROBIC AND ANAEROBIC 6CC  Final   Culture NO GROWTH 3 DAYS  Final   Report Status PENDING  Incomplete  MRSA PCR Screening     Status: Abnormal   Collection Time: 07/03/15  8:05 PM  Result Value Ref Range Status   MRSA by PCR POSITIVE (A) NEGATIVE Final    Comment:        The GeneXpert MRSA Assay (FDA approved for NASAL specimens only), is one component of a comprehensive MRSA colonization surveillance program. It is not intended to diagnose MRSA infection nor to guide or monitor treatment for MRSA infections. RESULT CALLED TO, READ BACK BY AND VERIFIED WITH: JOHNSON B AT 0200 ON [id number] BY FORSYTH K   Culture, blood (Routine X 2) w Reflex to ID Panel     Status: None (Preliminary result)   Collection Time: 07/03/15  8:15 PM  Result Value Ref Range Status   Specimen Description BLOOD RIGHT HAND  Final   Special Requests BOTTLES DRAWN AEROBIC ONLY 6CC  Final   Culture NO GROWTH 3 DAYS  Final   Report Status PENDING  Incomplete     Labs: Basic Metabolic Panel:  Recent Labs Lab 07/03/15 1155 07/04/15 0721 07/06/15 0722  NA 145 142 142  K 4.0 5.3* 4.5  CL 107 110 117*  CO2 22 22 22   GLUCOSE 167* 82 136*  BUN 42* 47* 30*  CREATININE 1.25* 1.67* 1.07*  CALCIUM 9.5 8.3* 8.1*   Liver Function Tests:  Recent Labs Lab 07/03/15 1155 07/04/15 0721  AST 52* 116*  ALT 19 63*  ALKPHOS 63 79  BILITOT 0.4 0.4  PROT 6.7 6.0*  ALBUMIN 3.8 3.1*    Recent Labs Lab 07/03/15 1155  LIPASE 22  CBC:  Recent Labs Lab 07/03/15 1159 07/04/15 0721  WBC 10.5 18.8*  NEUTROABS 9.4*  --   HGB 12.0 12.2  HCT 36.9 36.4  MCV 100.3* 97.3  PLT 164 134*   Cardiac Enzymes:  Recent  Labs Lab 07/03/15 1155  TROPONINI 0.05*   BNP:  SignedOrvan Falconer MD.  [facility] 07/06/2015, 11:34 AM

## 2015-07-06 NOTE — Care Management Important Message (Signed)
Important Message  Patient Details  Name: ALLEENE FUNEZ MRN: [id number] Date of Birth: 05/04/1913   Medicare Important Message Given:  Yes    Alvie Heidelberg, RN 07/06/2015, 12:13 PM

## 2015-07-06 NOTE — Care Management Note (Signed)
Case Management Note  Patient Details  Name: [name] MRN: [id number] Date of Birth: Nov 14, 1912  Subjective/Objective:     CSW following for discharge to ALF               Action/Plan:  Discharge to ALF  Expected Discharge Date:                  Expected Discharge Plan:  [place]  In-House Referral:     Discharge planning Services     Post Acute Care Choice:    Choice offered to:     DME Arranged:    DME Agency:     HH Arranged:    [place] Agency:     Status of Service:  Completed, signed off  Medicare Important Message Given:    Date Medicare IM Given:    Medicare IM give by:    Date Additional Medicare IM Given:    Additional Medicare Important Message give by:     If discussed at [place] of Stay Meetings, dates discussed:    Additional Comments:  Alvie Heidelberg, RN 07/06/2015, 12:12 PM

## 2015-07-08 LAB — CULTURE, BLOOD (ROUTINE X 2)
CULTURE: NO GROWTH
CULTURE: NO GROWTH

## 2015-07-11 ENCOUNTER — Emergency Department (HOSPITAL_COMMUNITY): Payer: Medicare Other

## 2015-07-11 ENCOUNTER — Emergency Department (HOSPITAL_COMMUNITY)
Admission: EM | Admit: 2015-07-11 | Discharge: 2015-07-11 | Disposition: A | Discharge status: Home / Self Care | Payer: Medicare Other | Attending: Emergency Medicine | Admitting: Emergency Medicine

## 2015-07-11 ENCOUNTER — Encounter (HOSPITAL_COMMUNITY): Payer: Self-pay | Admitting: Emergency Medicine

## 2015-07-11 DIAGNOSIS — I10 Essential (primary) hypertension: Secondary | ICD-10-CM | POA: Insufficient documentation

## 2015-07-11 DIAGNOSIS — Y939 Activity, unspecified: Secondary | ICD-10-CM | POA: Diagnosis not present

## 2015-07-11 DIAGNOSIS — M545 Low back pain: Secondary | ICD-10-CM | POA: Diagnosis not present

## 2015-07-11 DIAGNOSIS — S199XXA Unspecified injury of neck, initial encounter: Secondary | ICD-10-CM | POA: Diagnosis not present

## 2015-07-11 DIAGNOSIS — Y92129 Unspecified place in nursing home as the place of occurrence of the external cause: Secondary | ICD-10-CM | POA: Insufficient documentation

## 2015-07-11 DIAGNOSIS — Z7982 Long term (current) use of aspirin: Secondary | ICD-10-CM | POA: Diagnosis not present

## 2015-07-11 DIAGNOSIS — E039 Hypothyroidism, unspecified: Secondary | ICD-10-CM | POA: Diagnosis not present

## 2015-07-11 DIAGNOSIS — W07XXXA Fall from chair, initial encounter: Secondary | ICD-10-CM | POA: Insufficient documentation

## 2015-07-11 DIAGNOSIS — Z8673 Personal history of transient ischemic attack (TIA), and cerebral infarction without residual deficits: Secondary | ICD-10-CM | POA: Diagnosis not present

## 2015-07-11 DIAGNOSIS — J449 Chronic obstructive pulmonary disease, unspecified: Secondary | ICD-10-CM | POA: Insufficient documentation

## 2015-07-11 DIAGNOSIS — S299XXA Unspecified injury of thorax, initial encounter: Secondary | ICD-10-CM | POA: Diagnosis not present

## 2015-07-11 DIAGNOSIS — M549 Dorsalgia, unspecified: Secondary | ICD-10-CM | POA: Diagnosis not present

## 2015-07-11 DIAGNOSIS — Y998 Other external cause status: Secondary | ICD-10-CM | POA: Diagnosis not present

## 2015-07-11 DIAGNOSIS — F0390 Unspecified dementia without behavioral disturbance: Secondary | ICD-10-CM | POA: Insufficient documentation

## 2015-07-11 DIAGNOSIS — S0003XA Contusion of scalp, initial encounter: Secondary | ICD-10-CM

## 2015-07-11 DIAGNOSIS — J45909 Unspecified asthma, uncomplicated: Secondary | ICD-10-CM | POA: Insufficient documentation

## 2015-07-11 DIAGNOSIS — E119 Type 2 diabetes mellitus without complications: Secondary | ICD-10-CM | POA: Insufficient documentation

## 2015-07-11 DIAGNOSIS — R0682 Tachypnea, not elsewhere classified: Secondary | ICD-10-CM | POA: Diagnosis not present

## 2015-07-11 DIAGNOSIS — S0001XA Abrasion of scalp, initial encounter: Secondary | ICD-10-CM | POA: Insufficient documentation

## 2015-07-11 DIAGNOSIS — W19XXXA Unspecified fall, initial encounter: Secondary | ICD-10-CM

## 2015-07-11 DIAGNOSIS — T148 Other injury of unspecified body region: Secondary | ICD-10-CM | POA: Diagnosis not present

## 2015-07-11 HISTORY — DX: Do not resuscitate: Z66

## 2015-07-11 NOTE — ED Notes (Signed)
Per ems patient from [facility] for fall. Patient states she was eating and fell over chairs. Hematoma back of head. Patient complains of back pain. Patient history of Dementia.

## 2015-07-11 NOTE — ED Provider Notes (Signed)
CSN: [id number]     Arrival date & time 07/11/15  1234 History   First MD Initiated Contact with Patient 07/11/15 1245     Chief Complaint  Patient presents with  . Fall      Patient is a 82 y.o. female presenting with fall. The history is provided by the EMS personnel and the nursing home. The history is limited by the condition of the patient (hx dementia).  Fall  Pt was seen at 1310. Per EMS and NH report: Pt was eating and fell backward over chairs. No reported LOC. Pt has significant hx of dementia and currently denies any complaints.   Td reported as UTD Past Medical History  Diagnosis Date  . Urinary retention   . Gastritis   . Esophageal ulcer   . Salmonella sepsis ([place])   . Degenerative disk disease   . Spinal stenosis   . Asthma   . COPD (chronic obstructive pulmonary disease) ([place])   . Diabetes mellitus, type 2 ([place])   . Diverticulosis of colon   . GERD (gastroesophageal reflux disease)   . Hypertension   . Osteoarthritis   . Hiatal hernia   . Pancreatitis   . S/P endoscopy 2009    Dr. Oneida Alar: gastritis  . Diverticulitis   . S/P endoscopy March 2012    Dr. Gala Romney: NSAID induced ulcer  . Hypothyroidism   . MGUS (monoclonal gammopathy of unknown significance)   . Stroke ([place])   . Dementia   . Renal disorder   . DNR (do not resuscitate)     admission 07/06/15   Past Surgical History  Procedure Laterality Date  . Cervical fusion    . Cholecystectomy    . Ercp N/A 02/02/2014    Procedure: ENDOSCOPIC RETROGRADE CHOLANGIOPANCREATOGRAPHY (ERCP), STONE BASKET EXTRACTION;  Surgeon: Daneil Dolin, MD;  Location: [facility];  Service: Endoscopy;  Laterality: N/A;  . Sphincterotomy N/A 02/02/2014    Procedure: SPHINCTEROTOMY;  Surgeon: Daneil Dolin, MD;  Location: [facility];  Service: Endoscopy;  Laterality: N/A;  . Balloon dilation N/A 02/02/2014    Procedure: BALLOON DILATION;  Surgeon: Daneil Dolin, MD;  Location: [facility];  Service: Endoscopy;  Laterality: N/A;  .  Biliary stent placement N/A 02/02/2014    Procedure: BILIARY STENT PLACEMENT;  Surgeon: Daneil Dolin, MD;  Location: [facility];  Service: Endoscopy;  Laterality: N/A;  . Spyglass cholangioscopy N/A 02/02/2014    Procedure: [id number] CHOLANGIOSCOPY;  Surgeon: Daneil Dolin, MD;  Location: [facility];  Service: Endoscopy;  Laterality: N/A;  . Esophagogastroduodenoscopy N/A 02/05/2014    Procedure: ESOPHAGOGASTRODUODENOSCOPY (EGD);  Surgeon: [name], MD;  Location: [facility];  Service: Endoscopy;  Laterality: N/A;   Family History  Problem Relation Age of Onset  . Diabetes Mother    Social History  Substance Use Topics  . Smoking status: Never Smoker   . Smokeless tobacco: Never Used  . Alcohol Use: No    Review of Systems  Unable to perform ROS: Dementia      Allergies  Review of patient's allergies indicates no known allergies.  Home Medications   Prior to Admission medications   Medication Sig Start Date End Date Taking? Authorizing Provider  acetaminophen (TYLENOL) 325 MG tablet Take 2 tablets (650 mg total) by mouth every 6 (six) hours as needed for mild pain (or Fever >/= 101). 04/27/15  Yes Albertine Patricia, MD  aspirin 81 MG tablet Take 81 mg by mouth daily.  Yes Historical Provider, MD  Calcium Citrate-Vitamin D 200-250 MG-UNIT TABS Take 1 tablet by mouth daily.   Yes Historical Provider, MD  carbamide peroxide (EARWAX TREATMENT DROPS) 6.5 % otic solution Place 5 drops into both ears 2 (two) times daily as needed (build up).   Yes Historical Provider, MD  ciprofloxacin (CIPRO) 500 MG tablet Take 1 tablet (500 mg total) by mouth 2 (two) times daily. 07/06/15  Yes Orvan Falconer, MD  levETIRAcetam (KEPPRA) 250 MG tablet Take 1 tablet (250 mg total) by mouth daily. 02/09/14  Yes [name], DO  levothyroxine (SYNTHROID, LEVOTHROID) 25 MCG tablet Take 25 mcg by mouth daily before breakfast.   Yes Historical Provider, MD  montelukast (SINGULAIR) 10 MG tablet Take 10 mg by  mouth every morning.    Yes Historical Provider, MD  polyethylene glycol (MIRALAX / GLYCOLAX) packet Take 17 g by mouth daily as needed for mild constipation.   Yes Historical Provider, MD  solifenacin (VESICARE) 5 MG tablet Take 5 mg by mouth daily.   Yes Historical Provider, MD  tamsulosin (FLOMAX) 0.4 MG CAPS capsule Take 1 capsule (0.4 mg total) by mouth daily. 02/08/14  Yes [name], DO  theophylline (UNIPHYL) 400 MG 24 hr tablet Take 400 mg by mouth daily.   Yes Historical Provider, MD   BP 154/58 mmHg  Pulse 96  Temp(Src) 98.8 F (37.1 C) (Oral)  Resp 19  Ht 5\' 3"  (1.6 m)  Wt 103 lb (46.72 kg)  BMI 18.25 kg/m2  SpO2 100% Physical Exam  1315: Physical examination:  Nursing notes reviewed; Vital signs and O2 SAT reviewed;  Constitutional: Well developed, Well nourished, Well hydrated, In no acute distress; Head:  Normocephalic, +hematoma posterior scalp with superficial abrasion.; Eyes: EOMI, PERRL, No scleral icterus; ENMT: Mouth and pharynx normal, Mucous membranes moist; Neck: Supple, Full range of motion, No lymphadenopathy; Cardiovascular: Regular rate and rhythm, No gallop; Respiratory: Breath sounds clear & equal bilaterally, No wheezes.  Speaking full sentences with ease, Normal respiratory effort/excursion; Chest: Nontender, Movement normal; Abdomen: Soft, Nontender, Nondistended, Normal bowel sounds; Genitourinary: No CVA tenderness; Spine:  No midline CS, TS, LS tenderness.;; Extremities: Pulses normal, No tenderness, No edema, No calf edema or asymmetry.; Neuro: Awake, alert, confused per hx dementia.  Speech clear. Grips equal. Moves all extremities on stretcher spontaneously and to command without apparent gross focal motor deficits.; Skin: Color normal, Warm, Dry.   ED Course  Procedures (including critical care time) Labs Review  Imaging Review  I have personally reviewed and evaluated these images and lab results as part of my medical decision-making.   EKG  Interpretation None      MDM  MDM Reviewed: previous chart, nursing note and vitals Interpretation: CT scan and x-ray     Dg Chest 2 View 07/11/2015  CLINICAL DATA:  One 82 year old female who fell from chair today at church. Initial encounter. EXAM: CHEST  2 VIEW COMPARISON:  07/03/2015 and earlier. FINDINGS: Upright AP and lateral views of the chest. Skin fold artifact over the right chest. Chronic large lung volumes. Stable mild cardiomegaly and tortuosity of the thoracic aorta. Other mediastinal contours are within normal limits. Visualized tracheal air column is within normal limits. No pneumothorax, pulmonary edema, pleural effusion or confluent pulmonary opacity. Osteopenia. Chronic cervical ACDF hardware. No acute osseous abnormality identified. Stable cholecystectomy clips. Calcified aortic atherosclerosis. IMPRESSION: No acute cardiopulmonary abnormality or acute traumatic injury identified. Electronically Signed   By: Genevie Ann M.D.   On:  07/11/2015 13:58   Dg Lumbar Spine Complete 07/11/2015  CLINICAL DATA:  Status post fall from a chair today. Low back pain. Initial encounter. EXAM: LUMBAR SPINE - COMPLETE 4+ VIEW COMPARISON:  CT abdomen and pelvis 07/03/2015. FINDINGS: No fracture is identified. Anterolisthesis L3 on L4 and L4 on L5 is unchanged. The patient has advanced lower lumbar facet degenerative disease. Biliary stent is noted. There is contrast material in the colon with innumerable diverticula noted. IMPRESSION: No acute abnormality. Lower lumbar spondylosis. Diverticulosis. Electronically Signed   By: Inge Rise M.D.   On: 07/11/2015 14:00   Ct Head Wo Contrast 07/11/2015  CLINICAL DATA:  [place] over chair is with trauma to the back of the head. Dementia. EXAM: CT HEAD WITHOUT CONTRAST CT CERVICAL SPINE WITHOUT CONTRAST TECHNIQUE: Multidetector CT imaging of the head and cervical spine was performed following the standard protocol without intravenous contrast.  Multiplanar CT image reconstructions of the cervical spine were also generated. COMPARISON:  04/26/2015.  04/14/2015. FINDINGS: CT HEAD FINDINGS The brain shows mild generalized atrophy. There is no evidence of acute infarction, mass lesion, hemorrhage, hydrocephalus or extra-axial collection. Moderate chronic small-vessel ischemic changes affect the deep white matter. No skull fracture. No fluid in the sinuses, middle ears or mastoids. There is atherosclerotic calcification of the major vessels at the base of the brain. CT CERVICAL SPINE FINDINGS There has been previous ACDF from C4 through C7. No complications seen in that region. There is no evidence of fracture or traumatic malalignment. There is degenerative spondylosis at C3-4. There is degenerative facet arthropathy at C2-3 and C7-T1. IMPRESSION: Head CT: No acute or traumatic finding. Atrophy and chronic small vessel disease. Cervical spine CT: No acute or traumatic finding. Previous ACDF C4 through C7. Degenerative spondylosis and facet arthropathy above and below that. Electronically Signed   By: Nelson Chimes M.D.   On: 07/11/2015 13:58   Ct Cervical Spine Wo Contrast 07/11/2015  CLINICAL DATA:  [place] over chair is with trauma to the back of the head. Dementia. EXAM: CT HEAD WITHOUT CONTRAST CT CERVICAL SPINE WITHOUT CONTRAST TECHNIQUE: Multidetector CT imaging of the head and cervical spine was performed following the standard protocol without intravenous contrast. Multiplanar CT image reconstructions of the cervical spine were also generated. COMPARISON:  04/26/2015.  04/14/2015. FINDINGS: CT HEAD FINDINGS The brain shows mild generalized atrophy. There is no evidence of acute infarction, mass lesion, hemorrhage, hydrocephalus or extra-axial collection. Moderate chronic small-vessel ischemic changes affect the deep white matter. No skull fracture. No fluid in the sinuses, middle ears or mastoids. There is atherosclerotic calcification of the major  vessels at the base of the brain. CT CERVICAL SPINE FINDINGS There has been previous ACDF from C4 through C7. No complications seen in that region. There is no evidence of fracture or traumatic malalignment. There is degenerative spondylosis at C3-4. There is degenerative facet arthropathy at C2-3 and C7-T1. IMPRESSION: Head CT: No acute or traumatic finding. Atrophy and chronic small vessel disease. Cervical spine CT: No acute or traumatic finding. Previous ACDF C4 through C7. Degenerative spondylosis and facet arthropathy above and below that. Electronically Signed   By: Nelson Chimes M.D.   On: 07/11/2015 13:58     1530:  Workup reassuring. Wound care provided to scalp abrasion. Will d/c back to NH.    Francine Graven, DO 07/14/15 2035

## 2015-07-11 NOTE — Discharge Instructions (Signed)
°Emergency Department Resource Guide °1) Find a Doctor and Pay Out of Pocket °Although you won't have to find out who is covered by your insurance plan, it is a good idea to ask around and get recommendations. You will then need to call the office and see if the doctor you have chosen will accept you as a new patient and what types of options they offer for patients who are self-pay. Some doctors offer discounts or will set up payment plans for their patients who do not have insurance, but you will need to ask so you aren't surprised when you get to your appointment. ° °2) Contact Your Local Health Department °Not all health departments have doctors that can see patients for sick visits, but many do, so it is worth a call to see if yours does. If you don't know where your local health department is, you can check in your phone book. The CDC also has a tool to help you locate your state's health department, and many state websites also have listings of all of their local health departments. ° °3) Find a Walk-in Clinic °If your illness is not likely to be very severe or complicated, you may want to try a walk in clinic. These are popping up all over the country in pharmacies, drugstores, and shopping centers. They're usually staffed by nurse practitioners or physician assistants that have been trained to treat common illnesses and complaints. They're usually fairly quick and inexpensive. However, if you have serious medical issues or chronic medical problems, these are probably not your best option. ° °No Primary Care Doctor: °- Call [facility] at  832-8000 - they can help you locate a primary care doctor that  accepts your insurance, provides certain services, etc. °- Physician Referral Service- 1-800-533-3463 ° °Chronic Pain Problems: °Organization         Address  Phone   Notes  °[facility]  ([phone]) [phone] Patients need to be referred by their primary care doctor.  ° °Medication  Assistance: °Organization         Address  Phone   Notes  °[place] [facility] [address] °[place], [place] [address] (336) 641-8030 --Must be a resident of [place] °-- Must have NO insurance coverage whatsoever (no Medicaid/ Medicare, etc.) °-- The pt. MUST have a primary care doctor that directs their care regularly and follows them in the community °  °[facility]  (866) 331-1348   °[employer]  (888) 892-1162   ° °Agencies that provide inexpensive medical care: °Organization         Address  Phone   Notes  °[facility]  (336) 832-8035   °[facility]    (336) 832-7272   °[facility] [address] °[place], [place] [address] (336) 832-4777   °[facility] [address], °[place] (336) 271-4999   °[facility]    (336) 373-0678   °[facility]    (336) 272-1050   °[facility] ° [address], [place] Phone:  (336) 832-4444, Fax:  (336) 832-4440 Hours of Operation:  9 am - 6 pm, M-F.  Also accepts Medicaid/Medicare and self-pay.  ° [facility] ° [address], [place] Phone: (336) 832-3150, Fax: (336) [phone]. Hours of Operation:  8:30 am - 5:30 pm, M-F.  Also accepts Medicaid and self-pay.  °[facility] [place] [address], [place] Phone: (336) 878-6027   °[facility] [address], [place], [place] (336)723-1848, Ext. 123 Mondays & Thursdays: 7-9 AM.  First 15 patients are seen on a first come, first serve basis. °  ° °Medicaid-accepting [place] Providers: ° °Organization         Address  Phone   Notes  °[facility] [address], [place] (336) 641-2100 Also accepts self-pay patients.  °[facility] [address], [place] ° (336) 856-9996   °[facility] [address], [place]  (336) 288-8857   °[facility] [address] [place] [address], [place] (336) 299-7000   °Veita Bland [address], [place]  ° (336) 373-1557 Only accepts [place] Access Medicaid patients after they have their name applied to their card.  ° °Self-Pay (no insurance) in [place]: ° °Organization         Address  Phone   Notes  °Sickle Cell Patients, [facility] [address], [place] (336) 832-1970   °[facility] [address], [place] (336) 832-4400   °[facility] ° [address],  (336) 992-4800   °[facility]/Dr. Osei-Bonsu ° [address] [place] [address], [place] or [address], [place] (336) [phone] Phone number for both [place] and [place] locations is the same.  °[facility] [address], [place] (336) 299-0000   °[facility] [address] [place] [address], [place] or [address] (336) 852-7530 °(336) 878-2260   °[facility] [address], [place] (336) 350-1642, phone; (336) 294-5005, fax Sees patients 1st and 3rd Saturday of every month.  Must not qualify for public or private insurance (i.e. Medicaid, Medicare, [place] Health Choice, Veterans' Benefits) • Household income should be no more than 200% of the poverty level •The clinic cannot treat you if you are pregnant or think you are pregnant • Sexually transmitted diseases are not treated at the clinic.  ° ° °Dental Care: °Organization         Address  Phone  Notes  °[place] [facility] [address], [place] (336) 641-6152 Accepts children up to age 21 who are enrolled in Medicaid or [place] Health Choice; pregnant women with a Medicaid card; and children who have applied for Medicaid or [place] Health Choice, but were declined, whose parents can pay a reduced fee at time of service.  °[place]  [facility] [place]  [address], [place] (336) 641-7733 Accepts children up to age 21 who are enrolled in Medicaid or [place] Health Choice; pregnant women with a Medicaid card; and children who have applied for Medicaid or [place] Health Choice, but were declined, whose parents can pay a reduced fee at time of service.  °[facility] ° [address], [place] (336) 641-4533 Patients are seen by appointment only. Walk-ins are not accepted. [facility] will see patients 18 years of age and older. °Monday - Tuesday (8am-5pm) °Most Wednesdays (8:30-5pm) °$30 per visit, cash only  °[facility] ° [address], [place] (336) 641-4533 Patients are seen by appointment only. Walk-ins are not accepted. [facility] will see patients 18 years of age and older. °One   Wednesday Evening (Monthly: Volunteer Based).  $30 per visit, cash only  °[facility]  (919) 537-3737 for adults; Children under age 4, call Graduate Pediatric Dentistry at ([phone]) [phone]. Children aged 4-14, please call (919) [phone] to request a pediatric application. ° Dental services are provided in all areas of dental care including fillings, crowns and bridges, complete and partial dentures, implants, gum treatment, root canals, and extractions. Preventive care is also provided. Treatment is provided to both adults and children. °Patients are selected via a lottery and there is often a waiting list. °  °[facility] [address], °[place] ° (336) 763-8833 www.drcivils.com °  °[facility] [address], [place], [place] (336)723-1848, Ext. 123 Second and Fourth Thursday of each month, opens at 6:30 AM; Clinic ends at 9 AM.  Patients are seen on a first-come first-served basis, and a limited number are seen during each clinic.  ° °[facility] ° [address], [place], [place] (336) 723-7904    Eligibility Requirements °You must have lived in [place], or [place] counties for at least the last three months. °  You cannot be eligible for state or federal sponsored healthcare insurance, including Veterans Administration, Medicaid, or Medicare. °  You generally cannot be eligible for healthcare insurance through your employer.  °  How to apply: °Eligibility screenings are held every Tuesday and Wednesday afternoon from 1:00 pm until 4:00 pm. You do not need an appointment for the interview!  °[facility] [address], [place], [place] 336-631-2330   °[facility]  336-342-8273   °[facility]  336-703-3100   °[facility]  336-570-6415   ° °Behavioral Health Resources in the Community: °Intensive Outpatient Programs °Organization         Address  Phone  Notes  °[facility] [address], [place], [place] 336-878-6098   °[facility] [address], [place], [place] 336-832-9800   °[facility] [address], [place], [place] ° 336-882-2125   °[facility] [address],  °[place], [place] 1-800-853-5163 or 336-641-4981   °Substance Abuse Resources °Organization         Address  Phone  Notes  °[facility]  336-882-2125   °[facility]  336-784-9470   °[facility]  336-285-9073   °[facility]  336-845-3988   °Residential & Outpatient Substance Abuse Program  1-800-659-3381   °Psychological Services °Organization         Address  Phone  Notes  °[facility]  336- 832-9600   °[facility]  336- 378-7881   °[facility] [address], [place] 1-800-853-5163 or 336-641-4981   ° °Mobile Crisis Teams °Organization         Address  Phone  Notes  °[facility], Mobile Crisis Care Unit  1-877-626-1772   °Assertive °[facility] ° [address]  [place], [place] 336-834-9664   °Sharon DeEsch [address] °[place] 336-554-5454   ° °Self-Help/Support Groups °Organization         Address  Phone             Notes  °[facility] - variety of support groups  336- 373-1402 Call for more information  °Narcotics Anonymous (NA), [facility] [address], °[place]   2 meetings at this location  ° °  Residential Treatment Programs Organization         Address  Phone  Notes  [facility] [address]    [place]  1-2052333660   [facility]  [address], [place] T5558594, [place], [place]   [place], [place] [phone] Admissions: 8am-3pm M-F  [facility] [place] [address],    [place], [place] X4321937   [facility] [address] [place], [place], [place]   [facility] [address],  [place], [place]   [facility] - Intensive Outpatient [address], [place], [place]   [facility] [place].,  [place], [place] 1-825-240-0202 or 929-136-4374   [facility] [address], [place], [place] Accepts Medicaid  [facility] [address]  [place] 1-(857)880-5152 Substance Abuse/Addiction Treatment   [facility]         Address  Phone  Notes  [facility]  (352) 544-0759   Domenic Schwab, PhD [address] [place], [place]   8567558749 or 223-550-4592   [facility] [place], [place] (762)757-5748   [facility] [address] [place], [place] 308-360-8322 Insurance/Medicaid/sponsorship through [facility] [address] [place]                                    [place], [place] [phone] [place] [address] [place], [place] [phone]    Dr. Adele Schilder  [phone]   [facility] Dept. 1) [address] [place] 2) [place] 3)  [place] [address], [place] [phone]  (438)[phone]   [place] 256-804-7476 or 828-124-8592 (After Hours)      Take your usual prescriptions as previously directed.  Wash the abraded area with soap and water at least twice a day. Call your regular medical doctor today to schedule a follow up appointment for a recheck within the next 2 days.  Return to the Emergency Department immediately if worsening.

## 2015-07-11 NOTE — ED Notes (Signed)
Wound to occipital of head cleaned. No laceration found.

## 2015-07-11 NOTE — ED Notes (Signed)
[facility] called for report and patient pick up. Will send someone soon.

## 2015-07-13 DIAGNOSIS — Z1389 Encounter for screening for other disorder: Secondary | ICD-10-CM | POA: Diagnosis not present

## 2015-07-13 DIAGNOSIS — J449 Chronic obstructive pulmonary disease, unspecified: Secondary | ICD-10-CM | POA: Diagnosis not present

## 2015-07-13 DIAGNOSIS — N390 Urinary tract infection, site not specified: Secondary | ICD-10-CM | POA: Diagnosis not present

## 2015-07-26 ENCOUNTER — Inpatient Hospital Stay (HOSPITAL_COMMUNITY)
Admission: EM | Admit: 2015-07-26 | Discharge: 2015-07-29 | DRG: 871 | Disposition: A | Discharge status: Nursing Home (Includes ICF) | Payer: Medicare Other | Attending: Internal Medicine | Admitting: Internal Medicine

## 2015-07-26 ENCOUNTER — Emergency Department (HOSPITAL_COMMUNITY): Payer: Medicare Other

## 2015-07-26 ENCOUNTER — Encounter (HOSPITAL_COMMUNITY): Payer: Self-pay

## 2015-07-26 DIAGNOSIS — J189 Pneumonia, unspecified organism: Secondary | ICD-10-CM | POA: Diagnosis not present

## 2015-07-26 DIAGNOSIS — I129 Hypertensive chronic kidney disease with stage 1 through stage 4 chronic kidney disease, or unspecified chronic kidney disease: Secondary | ICD-10-CM | POA: Diagnosis not present

## 2015-07-26 DIAGNOSIS — K2970 Gastritis, unspecified, without bleeding: Secondary | ICD-10-CM | POA: Diagnosis not present

## 2015-07-26 DIAGNOSIS — R109 Unspecified abdominal pain: Secondary | ICD-10-CM

## 2015-07-26 DIAGNOSIS — R0902 Hypoxemia: Secondary | ICD-10-CM | POA: Diagnosis not present

## 2015-07-26 DIAGNOSIS — G40909 Epilepsy, unspecified, not intractable, without status epilepticus: Secondary | ICD-10-CM | POA: Diagnosis present

## 2015-07-26 DIAGNOSIS — Z8711 Personal history of peptic ulcer disease: Secondary | ICD-10-CM | POA: Diagnosis not present

## 2015-07-26 DIAGNOSIS — Z833 Family history of diabetes mellitus: Secondary | ICD-10-CM

## 2015-07-26 DIAGNOSIS — Z9049 Acquired absence of other specified parts of digestive tract: Secondary | ICD-10-CM

## 2015-07-26 DIAGNOSIS — E872 Acidosis: Secondary | ICD-10-CM | POA: Diagnosis present

## 2015-07-26 DIAGNOSIS — N183 Chronic kidney disease, stage 3 (moderate): Secondary | ICD-10-CM | POA: Diagnosis present

## 2015-07-26 DIAGNOSIS — Y95 Nosocomial condition: Secondary | ICD-10-CM | POA: Diagnosis present

## 2015-07-26 DIAGNOSIS — R1084 Generalized abdominal pain: Secondary | ICD-10-CM

## 2015-07-26 DIAGNOSIS — R509 Fever, unspecified: Secondary | ICD-10-CM | POA: Diagnosis not present

## 2015-07-26 DIAGNOSIS — J449 Chronic obstructive pulmonary disease, unspecified: Secondary | ICD-10-CM | POA: Diagnosis present

## 2015-07-26 DIAGNOSIS — B9620 Unspecified Escherichia coli [E. coli] as the cause of diseases classified elsewhere: Secondary | ICD-10-CM | POA: Diagnosis present

## 2015-07-26 DIAGNOSIS — Z8673 Personal history of transient ischemic attack (TIA), and cerebral infarction without residual deficits: Secondary | ICD-10-CM

## 2015-07-26 DIAGNOSIS — D72829 Elevated white blood cell count, unspecified: Secondary | ICD-10-CM | POA: Diagnosis not present

## 2015-07-26 DIAGNOSIS — J45909 Unspecified asthma, uncomplicated: Secondary | ICD-10-CM | POA: Diagnosis present

## 2015-07-26 DIAGNOSIS — J438 Other emphysema: Secondary | ICD-10-CM | POA: Diagnosis not present

## 2015-07-26 DIAGNOSIS — Z66 Do not resuscitate: Secondary | ICD-10-CM | POA: Diagnosis not present

## 2015-07-26 DIAGNOSIS — Z981 Arthrodesis status: Secondary | ICD-10-CM

## 2015-07-26 DIAGNOSIS — N390 Urinary tract infection, site not specified: Secondary | ICD-10-CM

## 2015-07-26 DIAGNOSIS — J440 Chronic obstructive pulmonary disease with acute lower respiratory infection: Secondary | ICD-10-CM | POA: Diagnosis present

## 2015-07-26 DIAGNOSIS — E1122 Type 2 diabetes mellitus with diabetic chronic kidney disease: Secondary | ICD-10-CM | POA: Diagnosis not present

## 2015-07-26 DIAGNOSIS — R112 Nausea with vomiting, unspecified: Secondary | ICD-10-CM | POA: Diagnosis not present

## 2015-07-26 DIAGNOSIS — Z79899 Other long term (current) drug therapy: Secondary | ICD-10-CM | POA: Diagnosis not present

## 2015-07-26 DIAGNOSIS — I10 Essential (primary) hypertension: Secondary | ICD-10-CM | POA: Diagnosis not present

## 2015-07-26 DIAGNOSIS — A419 Sepsis, unspecified organism: Principal | ICD-10-CM | POA: Diagnosis present

## 2015-07-26 DIAGNOSIS — R7989 Other specified abnormal findings of blood chemistry: Secondary | ICD-10-CM | POA: Diagnosis not present

## 2015-07-26 DIAGNOSIS — D649 Anemia, unspecified: Secondary | ICD-10-CM | POA: Diagnosis present

## 2015-07-26 DIAGNOSIS — E039 Hypothyroidism, unspecified: Secondary | ICD-10-CM | POA: Diagnosis present

## 2015-07-26 DIAGNOSIS — F0390 Unspecified dementia without behavioral disturbance: Secondary | ICD-10-CM | POA: Diagnosis present

## 2015-07-26 DIAGNOSIS — N3000 Acute cystitis without hematuria: Secondary | ICD-10-CM | POA: Diagnosis not present

## 2015-07-26 DIAGNOSIS — R778 Other specified abnormalities of plasma proteins: Secondary | ICD-10-CM | POA: Insufficient documentation

## 2015-07-26 DIAGNOSIS — K219 Gastro-esophageal reflux disease without esophagitis: Secondary | ICD-10-CM | POA: Diagnosis present

## 2015-07-26 HISTORY — DX: Other cholangitis: K83.09

## 2015-07-26 HISTORY — DX: Encephalopathy, unspecified: G93.40

## 2015-07-26 HISTORY — DX: Peptic ulcer, site unspecified, unspecified as acute or chronic, without hemorrhage or perforation: K27.9

## 2015-07-26 HISTORY — DX: Calculus of gallbladder without cholecystitis with obstruction: K80.21

## 2015-07-26 HISTORY — DX: Unspecified protein-calorie malnutrition: E46

## 2015-07-26 HISTORY — DX: Enterocolitis due to Clostridium difficile, not specified as recurrent: A04.72

## 2015-07-26 LAB — URINALYSIS, ROUTINE W REFLEX MICROSCOPIC
Bilirubin Urine: NEGATIVE
GLUCOSE, UA: NEGATIVE mg/dL
KETONES UR: NEGATIVE mg/dL
Nitrite: POSITIVE — AB
PROTEIN: 30 mg/dL — AB
Specific Gravity, Urine: 1.01 (ref 1.005–1.030)
pH: 5.5 (ref 5.0–8.0)

## 2015-07-26 LAB — CBC
HCT: 34.4 % — ABNORMAL LOW (ref 36.0–46.0)
Hemoglobin: 11.9 g/dL — ABNORMAL LOW (ref 12.0–15.0)
MCH: 32.2 pg (ref 26.0–34.0)
MCHC: 34.6 g/dL (ref 30.0–36.0)
MCV: 93.2 fL (ref 78.0–100.0)
PLATELETS: 152 10*3/uL (ref 150–400)
RBC: 3.69 MIL/uL — AB (ref 3.87–5.11)
RDW: 14.9 % (ref 11.5–15.5)
WBC: 20.6 10*3/uL — ABNORMAL HIGH (ref 4.0–10.5)

## 2015-07-26 LAB — COMPREHENSIVE METABOLIC PANEL
ALK PHOS: 90 U/L (ref 38–126)
ALT: 37 U/L (ref 14–54)
AST: 72 U/L — ABNORMAL HIGH (ref 15–41)
Albumin: 2.8 g/dL — ABNORMAL LOW (ref 3.5–5.0)
Anion gap: 6 (ref 5–15)
BILIRUBIN TOTAL: 0.7 mg/dL (ref 0.3–1.2)
BUN: 59 mg/dL — ABNORMAL HIGH (ref 6–20)
CALCIUM: 8.7 mg/dL — AB (ref 8.9–10.3)
CO2: 24 mmol/L (ref 22–32)
CREATININE: 1.45 mg/dL — AB (ref 0.44–1.00)
Chloride: 107 mmol/L (ref 101–111)
GFR, EST AFRICAN AMERICAN: 33 mL/min — AB (ref >60)
GFR, EST NON AFRICAN AMERICAN: 29 mL/min — AB (ref >60)
Glucose, Bld: 169 mg/dL — ABNORMAL HIGH (ref 65–99)
Potassium: 4 mmol/L (ref 3.5–5.1)
SODIUM: 137 mmol/L (ref 135–145)
Total Protein: 6.6 g/dL (ref 6.5–8.1)

## 2015-07-26 LAB — URINE MICROSCOPIC-ADD ON

## 2015-07-26 LAB — LIPASE, BLOOD: Lipase: 12 U/L (ref 11–51)

## 2015-07-26 LAB — INFLUENZA PANEL BY PCR (TYPE A & B)
H1N1FLUPCR: NOT DETECTED
Influenza A By PCR: NEGATIVE
Influenza B By PCR: NEGATIVE

## 2015-07-26 LAB — TROPONIN I
TROPONIN I: 0.06 ng/mL — AB (ref <0.031)
Troponin I: 0.07 ng/mL — ABNORMAL HIGH (ref <0.031)
Troponin I: 0.05 ng/mL — ABNORMAL HIGH (ref <0.031)

## 2015-07-26 LAB — I-STAT CG4 LACTIC ACID, ED: Lactic Acid, Venous: 1.08 mmol/L (ref 0.5–2.0)

## 2015-07-26 LAB — MAGNESIUM: Magnesium: 2.1 mg/dL (ref 1.7–2.4)

## 2015-07-26 LAB — PROCALCITONIN: PROCALCITONIN: 17.46 ng/mL

## 2015-07-26 MED ORDER — VANCOMYCIN HCL IN DEXTROSE 1-5 GM/200ML-% IV SOLN
1000.0000 mg | Freq: Once | INTRAVENOUS | Status: AC
  Administered 2015-07-26: 1000 mg via INTRAVENOUS
  Filled 2015-07-26: qty 200

## 2015-07-26 MED ORDER — SODIUM CHLORIDE 0.9 % IV BOLUS (SEPSIS)
500.0000 mL | Freq: Once | INTRAVENOUS | Status: AC
  Administered 2015-07-26: 500 mL via INTRAVENOUS

## 2015-07-26 MED ORDER — SODIUM CHLORIDE 0.9 % IV SOLN
500.0000 mg | INTRAVENOUS | Status: DC
  Administered 2015-07-28: 500 mg via INTRAVENOUS
  Filled 2015-07-26 (×2): qty 500

## 2015-07-26 MED ORDER — SODIUM CHLORIDE 0.9 % IV SOLN
INTRAVENOUS | Status: DC
  Administered 2015-07-26 – 2015-07-28 (×4): via INTRAVENOUS

## 2015-07-26 MED ORDER — CEFEPIME HCL 1 G IJ SOLR
1.0000 g | Freq: Once | INTRAMUSCULAR | Status: AC
  Administered 2015-07-26: 1 g via INTRAVENOUS
  Filled 2015-07-26: qty 1

## 2015-07-26 MED ORDER — ENOXAPARIN SODIUM 30 MG/0.3ML ~~LOC~~ SOLN
30.0000 mg | SUBCUTANEOUS | Status: DC
Start: 2015-07-26 — End: 2015-07-29
  Administered 2015-07-26 – 2015-07-29 (×4): 30 mg via SUBCUTANEOUS
  Filled 2015-07-26 (×4): qty 0.3

## 2015-07-26 MED ORDER — LEVOTHYROXINE SODIUM 25 MCG PO TABS
25.0000 ug | ORAL_TABLET | Freq: Every day | ORAL | Status: DC
  Administered 2015-07-27 – 2015-07-29 (×3): 25 ug via ORAL
  Filled 2015-07-26 (×3): qty 1

## 2015-07-26 MED ORDER — SODIUM CHLORIDE 0.9 % IV BOLUS (SEPSIS): 500.0000 mL | INTRAVENOUS | Status: DC

## 2015-07-26 MED ORDER — ALBUTEROL SULFATE (2.5 MG/3ML) 0.083% IN NEBU: 2.5000 mg | INHALATION_SOLUTION | RESPIRATORY_TRACT | Status: DC | PRN

## 2015-07-26 MED ORDER — MONTELUKAST SODIUM 10 MG PO TABS
10.0000 mg | ORAL_TABLET | Freq: Every morning | ORAL | Status: DC
  Administered 2015-07-27 – 2015-07-29 (×3): 10 mg via ORAL
  Filled 2015-07-26 (×3): qty 1

## 2015-07-26 MED ORDER — ACETAMINOPHEN 650 MG RE SUPP
650.0000 mg | Freq: Once | RECTAL | Status: AC
  Administered 2015-07-26: 650 mg via RECTAL
  Filled 2015-07-26: qty 1

## 2015-07-26 MED ORDER — DEXTROSE 5 % IV SOLN: 1.0000 g | Freq: Three times a day (TID) | INTRAVENOUS | Status: DC

## 2015-07-26 MED ORDER — CALCIUM CARBONATE-VITAMIN D 500-200 MG-UNIT PO TABS
1.0000 | ORAL_TABLET | Freq: Every day | ORAL | Status: DC
  Administered 2015-07-27 – 2015-07-29 (×3): 1 via ORAL
  Filled 2015-07-26 (×3): qty 1

## 2015-07-26 MED ORDER — SODIUM CHLORIDE 0.9 % IV BOLUS (SEPSIS)
500.0000 mL | Freq: Once | INTRAVENOUS | Status: AC
Start: 2015-07-26 — End: 2015-07-26
  Administered 2015-07-26: 500 mL via INTRAVENOUS

## 2015-07-26 MED ORDER — GUAIFENESIN ER 600 MG PO TB12
600.0000 mg | ORAL_TABLET | Freq: Two times a day (BID) | ORAL | Status: DC
  Administered 2015-07-26 – 2015-07-29 (×6): 600 mg via ORAL
  Filled 2015-07-26 (×6): qty 1

## 2015-07-26 MED ORDER — ACETAMINOPHEN 325 MG PO TABS
650.0000 mg | ORAL_TABLET | Freq: Four times a day (QID) | ORAL | Status: DC | PRN
  Administered 2015-07-28: 650 mg via ORAL
  Filled 2015-07-26: qty 2

## 2015-07-26 MED ORDER — THEOPHYLLINE ER 400 MG PO TB24
400.0000 mg | ORAL_TABLET | Freq: Every day | ORAL | Status: DC
  Administered 2015-07-27 – 2015-07-29 (×3): 400 mg via ORAL
  Filled 2015-07-26 (×4): qty 1

## 2015-07-26 MED ORDER — SODIUM CHLORIDE 0.9 % IV SOLN: INTRAVENOUS | Status: DC

## 2015-07-26 MED ORDER — ASPIRIN EC 81 MG PO TBEC
81.0000 mg | DELAYED_RELEASE_TABLET | Freq: Every day | ORAL | Status: DC
  Administered 2015-07-27 – 2015-07-29 (×3): 81 mg via ORAL
  Filled 2015-07-26 (×3): qty 1

## 2015-07-26 MED ORDER — SODIUM CHLORIDE 0.9 % IV BOLUS (SEPSIS): 1000.0000 mL | Freq: Once | INTRAVENOUS | Status: DC

## 2015-07-26 MED ORDER — LEVETIRACETAM 250 MG PO TABS
250.0000 mg | ORAL_TABLET | Freq: Every day | ORAL | Status: DC
  Administered 2015-07-26 – 2015-07-29 (×4): 250 mg via ORAL
  Filled 2015-07-26 (×4): qty 1

## 2015-07-26 MED ORDER — TAMSULOSIN HCL 0.4 MG PO CAPS
0.4000 mg | ORAL_CAPSULE | Freq: Every day | ORAL | Status: DC
  Administered 2015-07-26 – 2015-07-29 (×4): 0.4 mg via ORAL
  Filled 2015-07-26 (×4): qty 1

## 2015-07-26 MED ORDER — LEVALBUTEROL HCL 0.63 MG/3ML IN NEBU
0.6300 mg | INHALATION_SOLUTION | RESPIRATORY_TRACT | Status: DC | PRN
Start: 2015-07-26 — End: 2015-07-29

## 2015-07-26 MED ORDER — POLYETHYLENE GLYCOL 3350 17 G PO PACK: 17.0000 g | PACK | Freq: Every day | ORAL | Status: DC | PRN

## 2015-07-26 MED ORDER — DEXTROSE 5 % IV SOLN
1.0000 g | INTRAVENOUS | Status: DC
  Administered 2015-07-27 – 2015-07-29 (×3): 1 g via INTRAVENOUS
  Filled 2015-07-26 (×4): qty 1

## 2015-07-26 NOTE — ED Notes (Signed)
Hospitalist at bedside 

## 2015-07-26 NOTE — H&P (Signed)
[facility] History and Physical  [name] [id number] DOB: [birth year] DOA: 07/26/2015  Referring physician: Dr. Thurnell Garbe PCP: [name], MD   Chief Complaint: Nausea, vomiting, and abdominal pain   HPI:  [name] is 82 year old female with a past medical history significant fordementia, hypothyroidism, Jerrye Bushy w/ history of esophageal ulcer, asthma/COPD, DM type 2, osteoarthritis, diverticulitis, and CVA, who presents from nursing home reported to be having complaints of nausea, vomiting and abdominal pain. Patient has dementia and therefore limits the ability to give history. From report, it seems these symptoms have been ongoing over the last 2-3 days. No reported fevers documented at the facility. Associated symptoms included lethargy and generalized weakness.  Upon admission patient was found to have a heart rate 122, respiratory rate 31, and temperature 100.37F. Patient was not found to have orthostatic vital signs. Initial workup revealed WBC 20.6, creatinine 1.45, BUN 59, lactic acid 1.08, and troponin 0.06. Analysis showing many bacteria, small leukocyte esterase, positive nitrite, and  too numerous to count squamous epithelial cells and WBCs. Chest x-ray showing a new patchy left lower lobe opacity suggestive of pneumonia.   Review of Systems  Unable to perform ROS: dementia  Constitutional: Positive for fever.    Past Medical History  Diagnosis Date  . Urinary retention   . Gastritis   . Esophageal ulcer   . Salmonella sepsis ([place])   . Degenerative disk disease   . Spinal stenosis   . Asthma   . COPD (chronic obstructive pulmonary disease) ([place]   . Diabetes mellitus, type 2 ([place])   . Diverticulosis of colon   . GERD (gastroesophageal reflux disease)   . Hypertension   . Osteoarthritis   . Hiatal hernia   . Pancreatitis   . S/P endoscopy 2009    Dr. Oneida Alar: gastritis  . Diverticulitis   . S/P endoscopy March 2012    Dr. Gala Romney: NSAID  induced ulcer  . Hypothyroidism   . MGUS (monoclonal gammopathy of unknown significance)   . Stroke ([place])   . Dementia   . Renal disorder   . DNR (do not resuscitate)     admission 07/06/15  . C. difficile colitis   . Dementia   . Encephalopathy   . Protein calorie malnutrition ([place])   . Urinary retention   . Cholangitis   . Cholelithiasis with biliary obstruction   . MGUS (monoclonal gammopathy of unknown significance)   . PUD (peptic ulcer disease)      Past Surgical History  Procedure Laterality Date  . Cervical fusion    . Cholecystectomy    . Ercp N/A 02/02/2014    Procedure: ENDOSCOPIC RETROGRADE CHOLANGIOPANCREATOGRAPHY (ERCP), STONE BASKET EXTRACTION;  Surgeon: Daneil Dolin, MD;  Location: [facility];  Service: Endoscopy;  Laterality: N/A;  . Sphincterotomy N/A 02/02/2014    Procedure: SPHINCTEROTOMY;  Surgeon: Daneil Dolin, MD;  Location: [facility];  Service: Endoscopy;  Laterality: N/A;  . Balloon dilation N/A 02/02/2014    Procedure: BALLOON DILATION;  Surgeon: Daneil Dolin, MD;  Location: [facility];  Service: Endoscopy;  Laterality: N/A;  . Biliary stent placement N/A 02/02/2014    Procedure: BILIARY STENT PLACEMENT;  Surgeon: Daneil Dolin, MD;  Location: [facility];  Service: Endoscopy;  Laterality: N/A;  . Spyglass cholangioscopy N/A 02/02/2014    Procedure: [id number] CHOLANGIOSCOPY;  Surgeon: Daneil Dolin, MD;  Location: [facility];  Service: Endoscopy;  Laterality: N/A;  . Esophagogastroduodenoscopy N/A 02/05/2014    Procedure: ESOPHAGOGASTRODUODENOSCOPY (EGD);  Surgeon: [name], MD;  Location[facility];  Service: Endoscopy;  Laterality: N/A;      Social History:  reports that she has never smoked. She has never used smokeless tobacco. She reports that she does not drink alcohol or use illicit drugs. Where does patient [facility] facility  Can patient participate in ADLs? Needs assistance   No Known Allergies  Family History  Problem  Relation Age of Onset  . Diabetes Mother        Prior to Admission medications   Medication Sig Start Date End Date Taking? Authorizing Provider  aspirin 81 MG tablet Take 81 mg by mouth daily.   Yes Historical Provider, MD  Calcium Citrate-Vitamin D (CITRACAL PETITES/VITAMIN D) 200-250 MG-UNIT TABS Take 1 tablet by mouth daily.   Yes Historical Provider, MD  levETIRAcetam (KEPPRA) 250 MG tablet Take 1 tablet (250 mg total) by mouth daily. 02/09/14  Yes [name], DO  levothyroxine (SYNTHROID, LEVOTHROID) 25 MCG tablet Take 25 mcg by mouth daily before breakfast.   Yes Historical Provider, MD  montelukast (SINGULAIR) 10 MG tablet Take 10 mg by mouth every morning.    Yes Historical Provider, MD  solifenacin (VESICARE) 5 MG tablet Take 5 mg by mouth daily.   Yes Historical Provider, MD  tamsulosin (FLOMAX) 0.4 MG CAPS capsule Take 1 capsule (0.4 mg total) by mouth daily. 02/08/14  Yes [name], DO  theophylline (UNIPHYL) 400 MG 24 hr tablet Take 400 mg by mouth daily.   Yes Historical Provider, MD  acetaminophen (TYLENOL) 325 MG tablet Take 2 tablets (650 mg total) by mouth every 6 (six) hours as needed for mild pain (or Fever >/= 101). 04/27/15   Silver Huguenin Elgergawy, MD  Calcium Citrate-Vitamin D 200-250 MG-UNIT TABS Take 1 tablet by mouth daily. Reported on 07/26/2015    Historical Provider, MD  carbamide peroxide (EARWAX TREATMENT DROPS) 6.5 % otic solution Place 5 drops into both ears 2 (two) times daily as needed (build up).    Historical Provider, MD  ciprofloxacin (CIPRO) 500 MG tablet Take 1 tablet (500 mg total) by mouth 2 (two) times daily. Patient not taking: Reported on 07/26/2015 07/06/15   Orvan Falconer, MD  polyethylene glycol [facility] / Floria Raveling) packet Take 17 g by mouth daily as needed for mild constipation.    Historical Provider, MD     Physical Exam: Filed Vitals:   07/26/15 1400 07/26/15 1430 07/26/15 1500 07/26/15 1507  BP: 161/74 139/69 127/68 127/68  Pulse: 118 121  119 122  Temp:      TempSrc:      Resp: 26 24 29 14   Height:      Weight:      SpO2: 94% 92% 95% 95%     Constitutional: Vital signs reviewed. Patient is a thin elderly female appears to be in no acute distress. Head: Normocephalic and atraumatic  Ear: TM normal bilaterally  Mouth: no erythema or exudates, dry mucous membranes Eyes: PERRL, EOMI, conjunctivae normal, No scleral icterus.  Neck: Supple, Trachea midline normal ROM, No JVD, mass, thyromegaly, or carotid bruit present.  Cardiovascular: Tachycardic  Pulmonary/Chest: CTAB, no wheezes, rales, or rhonchi  Abdominal: Soft. Mildly distended, bowel sounds are normal, no masses, organomegaly, or guarding present.  GU: no CVA tenderness Musculoskeletal: No joint deformities, erythema, or stiffness, ROM full and no nontender Ext: no edema and no cyanosis, pulses palpable bilaterally (DP and PT)  Hematology: no cervical, inginal, or axillary adenopathy.  Neurological:  Alert, Strenght is 4/5 and symmetric bilaterally, cranial nerve II-XII are grossly intact, no focal motor deficit, sensory intact to light touch bilaterally.  Skin: Warm, dry and intact. Poor skin turgor. No rash, cyanosis, or clubbing.  Psychiatric: Pleasantly confused   Data Review   Micro Results No results found for this or any previous visit (from the past 240 hour(s)).  Radiology Reports Ct Abdomen Pelvis Wo Contrast  07/03/2015  CLINICAL DATA:  Nausea, vomiting, and epigastric pain EXAM: CT ABDOMEN AND PELVIS WITHOUT CONTRAST TECHNIQUE: Multidetector CT imaging of the abdomen and pelvis was performed following the standard protocol without IV contrast. COMPARISON:  06/09/2014 FINDINGS: Lower chest and abdominal wall: Airway thickening and occasional mucoid impaction in the lower lobes. Accounting for atelectasis, no pneumonia. Hepatobiliary: No focal liver abnormality.Cholecystectomy. Chronic common bile duct dilatation with indwelling stent from the lower  common bile duct to the duodenum. No neighboring calcified choledocholithiasis. Similar CBD dilatation at 11 mm. Pancreas: Generalized atrophy. Spleen: Unremarkable. Adrenals/Urinary Tract: Negative adrenals. No hydronephrosis or stone. 14 mm cyst in the interpolar right kidney. Unremarkable bladder. Reproductive:Unremarkable for age. Stomach/Bowel: No obstruction or inflammatory wall thickening. Extensive colonic and small bowel diverticulosis. No active inflammation. No appendicitis. Vascular/Lymphatic: Extensive atherosclerotic calcification. No evidence of acute vascular disease. No mass or adenopathy. Peritoneal: No ascites or pneumoperitoneum. Musculoskeletal: No acute finding. Advanced lumbar facet arthropathy with multilevel listhesis and advanced disc degeneration. IMPRESSION: 1. No acute finding or change since 2016. No explanation for symptoms. 2. Chronic findings are described above. Electronically Signed   By: Monte Fantasia M.D.   On: 07/03/2015 16:18   Dg Chest 2 View  07/11/2015  CLINICAL DATA:  One 82 year old female who fell from chair today at church. Initial encounter. EXAM: CHEST  2 VIEW COMPARISON:  07/03/2015 and earlier. FINDINGS: Upright AP and lateral views of the chest. Skin fold artifact over the right chest. Chronic large lung volumes. Stable mild cardiomegaly and tortuosity of the thoracic aorta. Other mediastinal contours are within normal limits. Visualized tracheal air column is within normal limits. No pneumothorax, pulmonary edema, pleural effusion or confluent pulmonary opacity. Osteopenia. Chronic cervical ACDF hardware. No acute osseous abnormality identified. Stable cholecystectomy clips. Calcified aortic atherosclerosis. IMPRESSION: No acute cardiopulmonary abnormality or acute traumatic injury identified. Electronically Signed   By: Genevie Ann M.D.   On: 07/11/2015 13:58   Dg Lumbar Spine Complete  07/11/2015  CLINICAL DATA:  Status post fall from a chair today. Low  back pain. Initial encounter. EXAM: LUMBAR SPINE - COMPLETE 4+ VIEW COMPARISON:  CT abdomen and pelvis 07/03/2015. FINDINGS: No fracture is identified. Anterolisthesis L3 on L4 and L4 on L5 is unchanged. The patient has advanced lower lumbar facet degenerative disease. Biliary stent is noted. There is contrast material in the colon with innumerable diverticula noted. IMPRESSION: No acute abnormality. Lower lumbar spondylosis. Diverticulosis. Electronically Signed   By: Inge Rise M.D.   On: 07/11/2015 14:00   Ct Head Wo Contrast  07/11/2015  CLINICAL DATA:  [place] over chair is with trauma to the back of the head. Dementia. EXAM: CT HEAD WITHOUT CONTRAST CT CERVICAL SPINE WITHOUT CONTRAST TECHNIQUE: Multidetector CT imaging of the head and cervical spine was performed following the standard protocol without intravenous contrast. Multiplanar CT image reconstructions of the cervical spine were also generated. COMPARISON:  04/26/2015.  04/14/2015. FINDINGS: CT HEAD FINDINGS The brain shows mild generalized atrophy. There is no evidence of acute infarction, mass lesion, hemorrhage, hydrocephalus or extra-axial collection. Moderate  chronic small-vessel ischemic changes affect the deep white matter. No skull fracture. No fluid in the sinuses, middle ears or mastoids. There is atherosclerotic calcification of the major vessels at the base of the brain. CT CERVICAL SPINE FINDINGS There has been previous ACDF from C4 through C7. No complications seen in that region. There is no evidence of fracture or traumatic malalignment. There is degenerative spondylosis at C3-4. There is degenerative facet arthropathy at C2-3 and C7-T1. IMPRESSION: Head CT: No acute or traumatic finding. Atrophy and chronic small vessel disease. Cervical spine CT: No acute or traumatic finding. Previous ACDF C4 through C7. Degenerative spondylosis and facet arthropathy above and below that. Electronically Signed   By: Nelson Chimes M.D.   On:  07/11/2015 13:58   Ct Cervical Spine Wo Contrast  07/11/2015  CLINICAL DATA:  [place] over chair is with trauma to the back of the head. Dementia. EXAM: CT HEAD WITHOUT CONTRAST CT CERVICAL SPINE WITHOUT CONTRAST TECHNIQUE: Multidetector CT imaging of the head and cervical spine was performed following the standard protocol without intravenous contrast. Multiplanar CT image reconstructions of the cervical spine were also generated. COMPARISON:  04/26/2015.  04/14/2015. FINDINGS: CT HEAD FINDINGS The brain shows mild generalized atrophy. There is no evidence of acute infarction, mass lesion, hemorrhage, hydrocephalus or extra-axial collection. Moderate chronic small-vessel ischemic changes affect the deep white matter. No skull fracture. No fluid in the sinuses, middle ears or mastoids. There is atherosclerotic calcification of the major vessels at the base of the brain. CT CERVICAL SPINE FINDINGS There has been previous ACDF from C4 through C7. No complications seen in that region. There is no evidence of fracture or traumatic malalignment. There is degenerative spondylosis at C3-4. There is degenerative facet arthropathy at C2-3 and C7-T1. IMPRESSION: Head CT: No acute or traumatic finding. Atrophy and chronic small vessel disease. Cervical spine CT: No acute or traumatic finding. Previous ACDF C4 through C7. Degenerative spondylosis and facet arthropathy above and below that. Electronically Signed   By: Nelson Chimes M.D.   On: 07/11/2015 13:58   Dg Chest Port 1 View  07/26/2015  CLINICAL DATA:  Nausea, vomiting, abdominal pain and generalized weakness for 2 or 3 days. History of hypertension. Nonsmoker. EXAM: PORTABLE CHEST 1 VIEW COMPARISON:  07/11/2015 and 07/03/2015 FINDINGS: 1128 hours. There is volume loss and new patchy airspace disease at the left lung base. The right lung is clear. The heart size and mediastinal contours are stable. No pneumothorax or significant pleural effusion is seen. The patient is  status post lower cervical fusion. Advanced arthropathic changes are present at both shoulders with evidence of chronic rotator cuff tears. IMPRESSION: New patchy left lower lobe airspace disease suspicious for pneumonia. Followup PA and lateral chest X-ray is recommended in 3-4 weeks following trial of antibiotic therapy to ensure resolution and exclude underlying malignancy. Electronically Signed   By: Richardean Sale M.D.   On: 07/26/2015 11:46   Dg Abd Acute W/chest  07/03/2015  CLINICAL DATA:  Nausea, vomiting, epigastric pain beginning this morning. EXAM: DG ABDOMEN ACUTE W/ 1V CHEST COMPARISON:  05/07/2015 FINDINGS: Mild hyperinflation of the lungs compatible with COPD. Heart is normal size. No confluent opacities or effusions. Nonobstructive bowel gas pattern. Moderate stool burden in the colon. Biliary stents noted in the right upper quadrant, in similar position to prior CT. No free air organomegaly. Prior cholecystectomy. IMPRESSION: Moderate stool burden. No evidence of bowel obstruction or free air. COPD.  No active cardiopulmonary disease. Electronically Signed  By: Rolm Baptise M.D.   On: 07/03/2015 12:38     CBC  Recent Labs Lab 07/26/15 1413  WBC 20.6*  HGB 11.9*  HCT 34.4*  PLT 152  MCV 93.2  MCH 32.2  MCHC 34.6  RDW 14.9    Chemistries   Recent Labs Lab 07/26/15 1413  NA 137  K 4.0  CL 107  CO2 24  GLUCOSE 169*  BUN 59*  CREATININE 1.45*  CALCIUM 8.7*  AST 72*  ALT 37  ALKPHOS 90  BILITOT 0.7   ------------------------------------------------------------------------------------------------------------------ estimated creatinine clearance is 15.2 mL/min (by C-G formula based on Cr of 1.45). ------------------------------------------------------------------------------------------------------------------ No results for input(s): HGBA1C in the last 72  hours. ------------------------------------------------------------------------------------------------------------------ No results for input(s): CHOL, HDL, LDLCALC, TRIG, CHOLHDL, LDLDIRECT in the last 72 hours. ------------------------------------------------------------------------------------------------------------------ No results for input(s): TSH, T4TOTAL, T3FREE, THYROIDAB in the last 72 hours.  Invalid input(s): FREET3 ------------------------------------------------------------------------------------------------------------------ No results for input(s): VITAMINB12, FOLATE, FERRITIN, TIBC, IRON, RETICCTPCT in the last 72 hours.  Coagulation profile No results for input(s): INR, PROTIME in the last 168 hours.  No results for input(s): DDIMER in the last 72 hours.  Cardiac Enzymes  Recent Labs Lab 07/26/15 1413  TROPONINI 0.06*   ------------------------------------------------------------------------------------------------------------------ Invalid input(s): POCBNP   CBG: No results for input(s): GLUCAP in the last 168 hours.     EKG: Independently reviewed. Sinus tachycardia with a rate of 123   Assessment/Plan Sepsis secondary to a suspected healthcare associated pneumonia: Patient meets sepsis criteria based off of heart rate 122, respiratory rate 31, WBC 20.6, and chest x-ray showing left lower lobe opacities suggestive of a pneumonia. Consider that required as patient lives in an [place] to a telemetry bed - Checking influenza screen, sputum culture if obtainable, follow-up blood cultures - Empiric antibiotics of vancomycin and cefepime, de-escalate when able - IV fluids of normal saline 75 mL per hour - Xopenex neb every 4 hours as needed for shortness of breath wheezing - Mucinex 600 mg twice a day  Possible Urinary tract infection versus contaminant: As seen UA had multiple squamous epithelial cells so this could be a UTI, but also  could be a contaminant. - Follow-up urine culture  - Already on empiric antibiotics for problem #1   Elevated troponin: Mild elevation at 0.06  - trend troponins x 3  Chronic kidney disease stage III: Baseline creatinine appears to be from 1.07-1.25. Upon admission had been elevated at 1.45 with elevation and BUN at 59. Ratio BUN to creatinine ratio greater than 20. Suspect dehydration - Continue IV fluids  COPD/asthma history stable - Continue  theophylline 400 mg daily , Singulair 10 mg daily  Generalized weakness - Will need to add on physical therapy if warranted  Diabetes mellitus type 2 - Carb modified diet - Continue to monitor  Hypothyroidism: Last TSH was 5.175 on 07/03/2015 - Check free T4 in a.m.  - Continue levothyroxine 25 mcg daily( patient also seen to be on the same dose back in 2016)  Seizure disorder - Continue  Keppra 250 mg daily  Anemia: Hemoglobin 11.9 on admission. Similar to previous values on past admissions -  Continue to monitor  Dementia: Stable  Code Status:  DNR Family Communication: bedside Disposition Plan: admit   Total time spent 55 minutes.Greater than 50% of this time was spent in counseling, explanation of diagnosis, planning of further management, and coordination of care  [facility] Pager [phone]  If 7PM-7AM, please contact night-coverage [email or web address]  Password [email or web address] 07/26/2015, 3:48 PM

## 2015-07-26 NOTE — Progress Notes (Signed)
Dr. Tamala Julian notified of runs of [place].

## 2015-07-26 NOTE — ED Notes (Addendum)
Lab has attempted to draw blood x 3 with no success. MD notified and gave order for PICC line.

## 2015-07-26 NOTE — ED Notes (Signed)
Vascular Access Team at bedside to place PICC.

## 2015-07-26 NOTE — ED Notes (Signed)
Pt resident of [facility] and EMS reports pt has c/o n/v, abd pain, and generalized weakness x 2 or 3 days.  Denies diarrhea.  Pt alert.  CBG 261.

## 2015-07-26 NOTE — ED Notes (Signed)
Vascular Access Team notified of need for PICC line. Should arrive around 1300 today to place line.

## 2015-07-26 NOTE — ED Provider Notes (Signed)
CSN: [id number]     Arrival date & time 07/26/15  0957 History   First MD Initiated Contact with Patient 07/26/15 1114     Chief Complaint  Patient presents with  . Emesis  . Abdominal Pain      Patient is a 82 y.o. female presenting with vomiting and abdominal pain. The history is provided by the EMS personnel and the nursing home. The history is limited by the condition of the patient (Hx dementia).  Emesis Associated symptoms: abdominal pain   Abdominal Pain Associated symptoms: vomiting   Pt was seen at 1125. Per EMS and NH report: NH states pt has had N/V and generalized weakness for the past 2 to 3 days. Pt also c/o abd pain to NH staff. No reported fevers, no diarrhea. Pt has significant hx of dementia and currently only c/o cough.   DNR Past Medical History  Diagnosis Date  . Urinary retention   . Gastritis   . Esophageal ulcer   . Salmonella sepsis ([place])   . Degenerative disk disease   . Spinal stenosis   . Asthma   . COPD (chronic obstructive pulmonary disease) ([place])   . Diabetes mellitus, type 2 ([place])   . Diverticulosis of colon   . GERD (gastroesophageal reflux disease)   . Hypertension   . Osteoarthritis   . Hiatal hernia   . Pancreatitis   . S/P endoscopy 2009    Dr. Oneida Alar: gastritis  . Diverticulitis   . S/P endoscopy March 2012    Dr. Gala Romney: NSAID induced ulcer  . Hypothyroidism   . MGUS (monoclonal gammopathy of unknown significance)   . Stroke ([place])   . Dementia   . Renal disorder   . DNR (do not resuscitate)     admission 07/06/15  . C. difficile colitis   . Dementia   . Encephalopathy   . Protein calorie malnutrition ([place])   . Urinary retention   . Cholangitis   . Cholelithiasis with biliary obstruction   . MGUS (monoclonal gammopathy of unknown significance)   . PUD (peptic ulcer disease)    Past Surgical History  Procedure Laterality Date  . Cervical fusion    . Cholecystectomy    . Ercp N/A 02/02/2014    Procedure: ENDOSCOPIC  RETROGRADE CHOLANGIOPANCREATOGRAPHY (ERCP), STONE BASKET EXTRACTION;  Surgeon: Daneil Dolin, MD;  Location: [facility];  Service: Endoscopy;  Laterality: N/A;  . Sphincterotomy N/A 02/02/2014    Procedure: SPHINCTEROTOMY;  Surgeon: Daneil Dolin, MD;  Location: [facility];  Service: Endoscopy;  Laterality: N/A;  . Balloon dilation N/A 02/02/2014    Procedure: BALLOON DILATION;  Surgeon: Daneil Dolin, MD;  Location: [facility];  Service: Endoscopy;  Laterality: N/A;  . Biliary stent placement N/A 02/02/2014    Procedure: BILIARY STENT PLACEMENT;  Surgeon: Daneil Dolin, MD;  Location: [facility];  Service: Endoscopy;  Laterality: N/A;  . Spyglass cholangioscopy N/A 02/02/2014    Procedure: [id number] CHOLANGIOSCOPY;  Surgeon: Daneil Dolin, MD;  Location: [facility];  Service: Endoscopy;  Laterality: N/A;  . Esophagogastroduodenoscopy N/A 02/05/2014    Procedure: ESOPHAGOGASTRODUODENOSCOPY (EGD);  Surgeon: [name], MD;  Location: [facility];  Service: Endoscopy;  Laterality: N/A;   Family History  Problem Relation Age of Onset  . Diabetes Mother    Social History  Substance Use Topics  . Smoking status: Never Smoker   . Smokeless tobacco: Never Used  . Alcohol Use: No    Review of Systems  Unable to perform ROS: Dementia  Gastrointestinal: Positive for vomiting and abdominal pain.      Allergies  Review of patient's allergies indicates no known allergies.  Home Medications   Prior to Admission medications   Medication Sig Start Date End Date Taking? Authorizing Provider  aspirin 81 MG tablet Take 81 mg by mouth daily.   Yes Historical Provider, MD  Calcium Citrate-Vitamin D (CITRACAL PETITES/VITAMIN D) 200-250 MG-UNIT TABS Take 1 tablet by mouth daily.   Yes Historical Provider, MD  levETIRAcetam (KEPPRA) 250 MG tablet Take 1 tablet (250 mg total) by mouth daily. 02/09/14  Yes [name], DO  levothyroxine (SYNTHROID, LEVOTHROID) 25 MCG tablet Take 25 mcg by mouth daily before  breakfast.   Yes Historical Provider, MD  montelukast (SINGULAIR) 10 MG tablet Take 10 mg by mouth every morning.    Yes Historical Provider, MD  solifenacin (VESICARE) 5 MG tablet Take 5 mg by mouth daily.   Yes Historical Provider, MD  tamsulosin (FLOMAX) 0.4 MG CAPS capsule Take 1 capsule (0.4 mg total) by mouth daily. 02/08/14  Yes [name], DO  theophylline (UNIPHYL) 400 MG 24 hr tablet Take 400 mg by mouth daily.   Yes Historical Provider, MD  acetaminophen (TYLENOL) 325 MG tablet Take 2 tablets (650 mg total) by mouth every 6 (six) hours as needed for mild pain (or Fever >/= 101). 04/27/15   Silver Huguenin Elgergawy, MD  Calcium Citrate-Vitamin D 200-250 MG-UNIT TABS Take 1 tablet by mouth daily. Reported on 07/26/2015    Historical Provider, MD  carbamide peroxide (EARWAX TREATMENT DROPS) 6.5 % otic solution Place 5 drops into both ears 2 (two) times daily as needed (build up).    Historical Provider, MD  ciprofloxacin (CIPRO) 500 MG tablet Take 1 tablet (500 mg total) by mouth 2 (two) times daily. Patient not taking: Reported on 07/26/2015 07/06/15   Orvan Falconer, MD  polyethylene glycol [facility] / Floria Raveling) packet Take 17 g by mouth daily as needed for mild constipation.    Historical Provider, MD   BP 161/74 mmHg  Pulse 118  Temp(Src) 100.8 F (38.2 C) (Rectal)  Resp 26  Ht 5\' 5"  (1.651 m)  Wt 103 lb (46.72 kg)  BMI 17.14 kg/m2  SpO2 94%   10:51 Orthostatic Vital Signs SP  Orthostatic Lying  - BP- Lying: 128/74 mmHg ; Pulse- Lying: 113  Orthostatic Sitting - BP- Sitting: 125/89 mmHg ; Pulse- Sitting: 110  Orthostatic Standing at 0 minutes - BP- Standing at 0 minutes:  (pt is unable to stand)     Patient Vitals for the past 24 hrs:  BP Temp Temp src Pulse Resp SpO2 Height Weight  07/26/15 1400 161/74 mmHg - - 118 26 94 % - -  07/26/15 1358 136/78 mmHg - - 87 16 99 % - -  07/26/15 1230 143/71 mmHg - - 108 (!) 29 95 % - -  07/26/15 1221 130/63 mmHg - - 108 17 95 % - -  07/26/15 1200  130/63 mmHg - - 104 (!) 27 95 % - -  07/26/15 1113 136/76 mmHg - - 108 (!) 28 97 % - -  07/26/15 1100 136/76 mmHg - - - (!) 31 - - -  07/26/15 1051 - 100.8 F (38.2 C) Rectal - - - - -  07/26/15 1030 141/64 mmHg - - 119 26 93 % - -  07/26/15 1005 130/71 mmHg 98.7 F (37.1 C) Oral (!) 125 16 96 % 5\' 5"  (1.651 m) 103  lb (46.72 kg)     Physical Exam  1130: Physical examination:  Nursing notes reviewed; Vital signs and O2 SAT reviewed;  Constitutional: Thin, frail. In no acute distress; Head:  Normocephalic, atraumatic; Eyes: EOMI, PERRL, No scleral icterus; ENMT: Mouth and pharynx normal, Mucous membranes dry; Neck: Supple, Full range of motion, No lymphadenopathy; Cardiovascular: Tachycardic rate and rhythm, No gallop; Respiratory: Breath sounds coarse & equal bilaterally, No wheezes. +moist cough during exam. Normal respiratory effort/excursion; Chest: Nontender, Movement normal; Abdomen: Soft, Nontender, Nondistended, Normal bowel sounds; Genitourinary: No CVA tenderness; Extremities: Pulses normal, No tenderness, No edema, No calf edema or asymmetry.; Neuro: Awake, alert, confused per hx of dementia. Speech minimal but clear. Moves all extremities spontaneously..; Skin: Color normal, Warm, Dry.   ED Course  Procedures (including critical care time) Labs Review  Imaging Review  I have personally reviewed and evaluated these images and lab results as part of my medical decision-making.   EKG Interpretation   Date/Time:  Thursday July 26 2015 10:11:28 EST Ventricular Rate:  123 PR Interval:  160 QRS Duration: 68 QT Interval:  302 QTC Calculation: 432 R Axis:   69 Text Interpretation:  Sinus tachycardia Anterior infarct, old Baseline  wander Artifact When compared with ECG of 07/03/2015 No significant change  was found Confirmed by [facility]  MD, Nunzio Cory [phone]) on 07/26/2015 11:34:27  AM      MDM  MDM Reviewed: previous chart, nursing note and vitals Reviewed previous: labs and  ECG Interpretation: labs, ECG and x-ray     Results for orders placed or performed during the hospital encounter of 07/26/15  Lipase, blood  Result Value Ref Range   Lipase 12 11 - 51 U/L  Comprehensive metabolic panel  Result Value Ref Range   Sodium 137 135 - 145 mmol/L   Potassium 4.0 3.5 - 5.1 mmol/L   Chloride 107 101 - 111 mmol/L   CO2 24 22 - 32 mmol/L   Glucose, Bld 169 (H) 65 - 99 mg/dL   BUN 59 (H) 6 - 20 mg/dL   Creatinine, Ser 1.45 (H) 0.44 - 1.00 mg/dL   Calcium 8.7 (L) 8.9 - 10.3 mg/dL   Total Protein 6.6 6.5 - 8.1 g/dL   Albumin 2.8 (L) 3.5 - 5.0 g/dL   AST 72 (H) 15 - 41 U/L   ALT 37 14 - 54 U/L   Alkaline Phosphatase 90 38 - 126 U/L   Total Bilirubin 0.7 0.3 - 1.2 mg/dL   GFR calc non Af Amer 29 (L) >60 mL/min   GFR calc Af Amer 33 (L) >60 mL/min   Anion gap 6 5 - 15  CBC  Result Value Ref Range   WBC 20.6 (H) 4.0 - 10.5 K/uL   RBC 3.69 (L) 3.87 - 5.11 MIL/uL   Hemoglobin 11.9 (L) 12.0 - 15.0 g/dL   HCT 34.4 (L) 36.0 - 46.0 %   MCV 93.2 78.0 - 100.0 fL   MCH 32.2 26.0 - 34.0 pg   MCHC 34.6 30.0 - 36.0 g/dL   RDW 14.9 11.5 - 15.5 %   Platelets 152 150 - 400 K/uL  Troponin I  Result Value Ref Range   Troponin I 0.06 (H) <0.031 ng/mL  Urinalysis, Routine w reflex microscopic  Result Value Ref Range   Color, Urine YELLOW YELLOW   APPearance CLEAR CLEAR   Specific Gravity, Urine 1.010 1.005 - 1.030   pH 5.5 5.0 - 8.0   Glucose, UA NEGATIVE NEGATIVE mg/dL   Hgb  urine dipstick MODERATE (A) NEGATIVE   Bilirubin Urine NEGATIVE NEGATIVE   Ketones, ur NEGATIVE NEGATIVE mg/dL   Protein, ur 30 (A) NEGATIVE mg/dL   Nitrite POSITIVE (A) NEGATIVE   Leukocytes, UA SMALL (A) NEGATIVE  Urine microscopic-add on  Result Value Ref Range   Squamous Epithelial / LPF TOO NUMEROUS TO COUNT (A) NONE SEEN   WBC, UA TOO NUMEROUS TO COUNT 0 - 5 WBC/hpf   RBC / HPF TOO NUMEROUS TO COUNT 0 - 5 RBC/hpf   Bacteria, UA MANY (A) NONE SEEN  I-Stat CG4 Lactic Acid, ED  Result  Value Ref Range   Lactic Acid, Venous 1.08 0.5 - 2.0 mmol/L    Dg Chest Port 1 View 07/26/2015  CLINICAL DATA:  Nausea, vomiting, abdominal pain and generalized weakness for 2 or 3 days. History of hypertension. Nonsmoker. EXAM: PORTABLE CHEST 1 VIEW COMPARISON:  07/11/2015 and 07/03/2015 FINDINGS: 1128 hours. There is volume loss and new patchy airspace disease at the left lung base. The right lung is clear. The heart size and mediastinal contours are stable. No pneumothorax or significant pleural effusion is seen. The patient is status post lower cervical fusion. Advanced arthropathic changes are present at both shoulders with evidence of chronic rotator cuff tears. IMPRESSION: New patchy left lower lobe airspace disease suspicious for pneumonia. Followup PA and lateral chest X-ray is recommended in 3-4 weeks following trial of antibiotic therapy to ensure resolution and exclude underlying malignancy. Electronically Signed   By: Richardean Sale M.D.   On: 07/26/2015 11:46   Results for DANALYN, GARDOCKI (MRN [id number]) as of 07/26/2015 15:08  Ref. Range 07/03/2015 11:55 07/04/2015 07:21 07/06/2015 07:22 07/26/2015 14:13  BUN Latest Ref Range: 6-20 mg/dL 42 (H) 47 (H) 30 (H) 59 (H)  Creatinine Latest Ref Range: 0.44-1.00 mg/dL 1.25 (H) 1.67 (H) 1.07 (H) 1.45 (H)   Results for MENDI, SWAPP (MRN [id number]) as of 07/26/2015 15:41  Ref. Range 06/09/2014 12:37 07/25/2014 11:32 04/25/2015 09:07 07/03/2015 11:55 07/26/2015 14:13  Troponin I Latest Ref Range: <0.031 ng/mL <0.03 <0.03 0.06 (H) 0.05 (H) 0.06 (H)    1535:  APAP and IVF given for fever. Pt unable to stand due to generalized weakness. IV abx started for UTI and HCAP after BC and UC obtained. Troponin elevated near recent baseline. T/C to [facility]. Tamala Julian, case discussed, including:  HPI, pertinent PM/SHx, VS/PE, dx testing, ED course and treatment:  Agreeable to admit, requests to write temporary orders, obtain tele bed to team  APAdmits.     Francine Graven, DO 07/30/15 1609

## 2015-07-26 NOTE — Progress Notes (Signed)
Pharmacy Antibiotic Note  [name] is a 82 y.o. female admitted on 07/26/2015 with pneumonia.  Pharmacy has been consulted for [facility] AND CEFEPIME dosing.  Plan: Vancomycin 1000mg  IV today x 1 then 500mg  IV q48hrs Cefepime 1gm IV q24hrs Check vanc trough at steady state - goal = 15-20 Monitor labs, renal fxn, c/s  Height: 5\' 5"  (165.1 cm) Weight: 103 lb (46.72 kg) IBW/kg (Calculated) : 57  Temp (24hrs), Avg:99.4 F (37.4 C), Min:98.6 F (37 C), Max:100.8 F (38.2 C)   Recent Labs Lab 07/26/15 1413 07/26/15 1502  WBC 20.6*  --   CREATININE 1.45*  --   LATICACIDVEN  --  1.08    Estimated Creatinine Clearance: 15.2 mL/min (by C-G formula based on Cr of 1.45).    No Known Allergies  Anti-infectives    Start     Dose/Rate Route Frequency Ordered Stop   07/28/15 0600  vancomycin (VANCOCIN) 500 mg in sodium chloride 0.9 % 100 mL IVPB     500 mg 100 mL/hr over 60 Minutes Intravenous Every 48 hours 07/26/15 1647     07/27/15 1000  ceFEPIme (MAXIPIME) 1 g in dextrose 5 % 50 mL IVPB     1 g 100 mL/hr over 30 Minutes Intravenous Every 24 hours 07/26/15 1648     07/26/15 1645  ceFEPIme (MAXIPIME) 1 g in dextrose 5 % 50 mL IVPB  Status:  Discontinued     1 g 100 mL/hr over 30 Minutes Intravenous 3 times per day 07/26/15 1643 07/26/15 1648   07/26/15 1315  vancomycin (VANCOCIN) IVPB 1000 mg/200 mL premix     1,000 mg 200 mL/hr over 60 Minutes Intravenous  Once 07/26/15 1226 07/26/15 1527   07/26/15 1230  ceFEPIme (MAXIPIME) 1 g in dextrose 5 % 50 mL IVPB     1 g 100 mL/hr over 30 Minutes Intravenous  Once 07/26/15 1224 07/26/15 1420     Results for orders placed or performed during the hospital encounter of 07/03/15  Urine culture     Status: None   Collection Time: 07/03/15 12:45 PM  Result Value Ref Range Status   Specimen Description URINE, CATHETERIZED  Final   Special Requests NONE  Final   Culture   Final    >=100,000 COLONIES/mL KLEBSIELLA  PNEUMONIAE Performed at [facility]    Report Status 07/05/2015 FINAL  Final   Organism ID, Bacteria KLEBSIELLA PNEUMONIAE  Final      Susceptibility   Klebsiella pneumoniae - MIC*    AMPICILLIN >=32 RESISTANT Resistant     CEFAZOLIN <=4 SENSITIVE Sensitive     CEFTRIAXONE <=1 SENSITIVE Sensitive     CIPROFLOXACIN 0.5 SENSITIVE Sensitive     GENTAMICIN <=1 SENSITIVE Sensitive     IMIPENEM <=0.25 SENSITIVE Sensitive     NITROFURANTOIN 32 SENSITIVE Sensitive     TRIMETH/SULFA <=20 SENSITIVE Sensitive     AMPICILLIN/SULBACTAM >=32 RESISTANT Resistant     PIP/TAZO <=4 SENSITIVE Sensitive     * >=100,000 COLONIES/mL KLEBSIELLA PNEUMONIAE  Culture, blood (Routine X 2) w Reflex to ID Panel     Status: None   Collection Time: 07/03/15  7:32 PM  Result Value Ref Range Status   Specimen Description BLOOD LEFT FOREARM  Final   Special Requests BOTTLES DRAWN AEROBIC AND ANAEROBIC 6CC  Final   Culture NO GROWTH 5 DAYS  Final   Report Status 07/08/2015 FINAL  Final  MRSA PCR Screening     Status: Abnormal   Collection Time:  07/03/15  8:05 PM  Result Value Ref Range Status   MRSA by PCR POSITIVE (A) NEGATIVE Final    Comment:        The GeneXpert MRSA Assay (FDA approved for NASAL specimens only), is one component of a comprehensive MRSA colonization surveillance program. It is not intended to diagnose MRSA infection nor to guide or monitor treatment for MRSA infections. RESULT CALLED TO, READ BACK BY AND VERIFIED WITH: JOHNSON B AT 0200 ON [id number] BY FORSYTH K   Culture, blood (Routine X 2) w Reflex to ID Panel     Status: None   Collection Time: 07/03/15  8:15 PM  Result Value Ref Range Status   Specimen Description BLOOD RIGHT HAND  Final   Special Requests BOTTLES DRAWN AEROBIC ONLY [place]  Final   Culture NO GROWTH 5 DAYS  Final   Report Status 07/08/2015 FINAL  Final    Thank you for allowing pharmacy to be a part of this patient's care.  Hart Robinsons A 07/26/2015 4:49  PM

## 2015-07-27 ENCOUNTER — Inpatient Hospital Stay (HOSPITAL_COMMUNITY): Payer: Medicare Other

## 2015-07-27 DIAGNOSIS — J189 Pneumonia, unspecified organism: Secondary | ICD-10-CM | POA: Diagnosis present

## 2015-07-27 DIAGNOSIS — A419 Sepsis, unspecified organism: Principal | ICD-10-CM

## 2015-07-27 DIAGNOSIS — R509 Fever, unspecified: Secondary | ICD-10-CM | POA: Insufficient documentation

## 2015-07-27 DIAGNOSIS — N3000 Acute cystitis without hematuria: Secondary | ICD-10-CM

## 2015-07-27 DIAGNOSIS — N390 Urinary tract infection, site not specified: Secondary | ICD-10-CM | POA: Insufficient documentation

## 2015-07-27 DIAGNOSIS — R778 Other specified abnormalities of plasma proteins: Secondary | ICD-10-CM | POA: Insufficient documentation

## 2015-07-27 DIAGNOSIS — R7989 Other specified abnormal findings of blood chemistry: Secondary | ICD-10-CM

## 2015-07-27 LAB — BASIC METABOLIC PANEL
ANION GAP: 8 (ref 5–15)
BUN: 53 mg/dL — ABNORMAL HIGH (ref 6–20)
CALCIUM: 7.7 mg/dL — AB (ref 8.9–10.3)
CHLORIDE: 116 mmol/L — AB (ref 101–111)
CO2: 21 mmol/L — AB (ref 22–32)
CREATININE: 1.46 mg/dL — AB (ref 0.44–1.00)
GFR calc non Af Amer: 28 mL/min — ABNORMAL LOW (ref >60)
GFR, EST AFRICAN AMERICAN: 33 mL/min — AB (ref >60)
Glucose, Bld: 101 mg/dL — ABNORMAL HIGH (ref 65–99)
Potassium: 3.4 mmol/L — ABNORMAL LOW (ref 3.5–5.1)
SODIUM: 145 mmol/L (ref 135–145)

## 2015-07-27 LAB — CBC
HCT: 26 % — ABNORMAL LOW (ref 36.0–46.0)
HEMOGLOBIN: 9 g/dL — AB (ref 12.0–15.0)
MCH: 32 pg (ref 26.0–34.0)
MCHC: 34.6 g/dL (ref 30.0–36.0)
MCV: 92.5 fL (ref 78.0–100.0)
PLATELETS: 122 10*3/uL — AB (ref 150–400)
RBC: 2.81 MIL/uL — AB (ref 3.87–5.11)
RDW: 14.9 % (ref 11.5–15.5)
WBC: 9.7 10*3/uL (ref 4.0–10.5)

## 2015-07-27 LAB — T4, FREE: Free T4: 1.16 ng/dL — ABNORMAL HIGH (ref 0.61–1.12)

## 2015-07-27 LAB — TROPONIN I: TROPONIN I: 0.05 ng/mL — AB (ref <0.031)

## 2015-07-27 NOTE — NC FL2 (Signed)
[place] LEVEL OF CARE SCREENING TOOL     IDENTIFICATION  Patient Name: [name] Birthdate: 05-18-1913 Sex: female Admission Date (Current Location): 07/26/2015  [place] and [place] Number:  [place] [id number] [place] and Address:  [place] [address] [place]      Provider Number: [id number]  Attending Physician Name and Address:  Koleen Nimrod [place] Name and Phone Number:       Current Level of Care: Hospital Recommended Level of Care: [place] Prior Approval Number:    Date Approved/Denied:   PASRR Number:    Discharge Plan: Other (Comment) [facility])    Current Diagnoses: Patient Active Problem List   Diagnosis Date Noted  . HCAP (healthcare-associated pneumonia) 07/27/2015  . Elevated troponin   . Pyrexia   . Urinary tract infectious disease   . Fever 07/26/2015  . Sepsis ([place]) 07/26/2015  . Fever of undetermined origin 07/03/2015  . Acute renal failure (ARF) ([place]) 04/25/2015  . Diarrhea 07/26/2014  . Enteritis due to Clostridium difficile 07/26/2014  . Acute kidney injury ([place]) 07/26/2014  . Dementia 07/26/2014  . Nausea with vomiting 07/26/2014  . Dehydration 07/25/2014  . Unspecified cerebral artery occlusion with cerebral infarction 02/08/2014  . Acute encephalopathy 02/06/2014  . Protein-calorie malnutrition, severe ([place]) 02/06/2014  . Urinary retention 02/05/2014  . Cholangitis 02/01/2014  . Cholelithiasis with biliary obstruction 02/01/2014  . Biliary obstruction 02/01/2014  . MGUS (monoclonal gammopathy of unknown significance) 08/28/2013  . PUD (peptic ulcer disease) 08/15/2010  . Diabetes ([place]) 12/28/2009  . Essential hypertension 12/28/2009  . ASTHMA 12/28/2009  . COPD (chronic obstructive pulmonary disease) ([place]) 12/28/2009  . GERD 12/28/2009  . DIVERTICULOSIS, COLON 12/28/2009  . OSTEOARTHRITIS 12/28/2009  . PANCREATITIS, HX OF 12/28/2009     Orientation RESPIRATION BLADDER Height & Weight     Self  Normal Incontinent Weight: 44.815 kg (98 lb 12.8 oz) Height:  5\' 3"  (160 cm)  BEHAVIORAL SYMPTOMS/MOOD NEUROLOGICAL BOWEL NUTRITION STATUS   (NONE)  (NONE) Incontinent Diet (DYS 1)  AMBULATORY STATUS COMMUNICATION OF NEEDS Skin   Limited Assist Verbally Normal                       Personal Care Assistance Level of Assistance  Bathing, Feeding, Dressing Bathing Assistance: Limited assistance Feeding assistance: Limited assistance Dressing Assistance: Limited assistance     Functional Limitations Info  Sight, Hearing, Speech Sight Info: Adequate Hearing Info: Adequate Speech Info: Adequate    SPECIAL CARE FACTORS FREQUENCY                       Contractures Contractures Info: Not present    Additional Factors Info  Code Status, Allergies, Psychotropic Code Status Info: DNR Allergies Info: NKDA           Current Medications (07/27/2015):  This is the current hospital active medication list Current Facility-Administered Medications  Medication Dose Route Frequency Provider Last Rate Last Dose  . 0.9 %  sodium chloride infusion   Intravenous Continuous Francine Graven, DO 75 mL/hr at 07/27/15 1528    . acetaminophen (TYLENOL) tablet 650 mg  650 mg Oral Q6H PRN Norval Morton, MD      . aspirin EC tablet 81 mg  81 mg Oral Daily Norval Morton, MD   81 mg at 07/27/15 1034  . calcium-vitamin D (OSCAL WITH D) 500-200 MG-UNIT per tablet 1 tablet  1 tablet Oral Daily Norval Morton, MD   1 tablet at 07/27/15 1033  . ceFEPIme (MAXIPIME) 1 g in dextrose 5 % 50 mL IVPB  1 g Intravenous Q24H Norval Morton, MD   1 g at 07/27/15 1033  . enoxaparin (LOVENOX) injection 30 mg  30 mg Subcutaneous Q24H Rondell Charmayne Sheer, MD   30 mg at 07/26/15 1757  . guaiFENesin (MUCINEX) 12 hr tablet 600 mg  600 mg Oral BID Norval Morton, MD   600 mg at 07/27/15 1034  . levalbuterol (XOPENEX) nebulizer solution 0.63 mg   0.63 mg Nebulization Q4H PRN Norval Morton, MD      . levETIRAcetam (KEPPRA) tablet 250 mg  250 mg Oral Daily Norval Morton, MD   250 mg at 07/27/15 1034  . levothyroxine (SYNTHROID, LEVOTHROID) tablet 25 mcg  25 mcg Oral QAC breakfast Norval Morton, MD   25 mcg at 07/27/15 1033  . montelukast (SINGULAIR) tablet 10 mg  10 mg Oral q morning - 10a Norval Morton, MD   10 mg at 07/27/15 1033  . polyethylene glycol (MIRALAX / GLYCOLAX) packet 17 g  17 g Oral Daily PRN Norval Morton, MD      . tamsulosin (FLOMAX) capsule 0.4 mg  0.4 mg Oral Daily Rondell Charmayne Sheer, MD   0.4 mg at 07/27/15 1034  . theophylline (UNIPHYL) 400 MG 24 hr tablet 400 mg  400 mg Oral Daily Norval Morton, MD   400 mg at 07/27/15 1034  . [START ON 07/28/2015] vancomycin (VANCOCIN) 500 mg in sodium chloride 0.9 % 100 mL IVPB  500 mg Intravenous Q48H Rondell Charmayne Sheer, MD         Discharge Medications: Please see discharge summary for a list of discharge medications.  Relevant Imaging Results:  Relevant Lab Results:   Additional Information Legal guardian Netta Cedars [phone]  Rigoberto Noel, LCSW

## 2015-07-27 NOTE — Progress Notes (Signed)
[facility] PROGRESS NOTE  [name] [id number] DOB: 1913/04/23 DOA: 07/26/2015 PCP: [name], MD  Assessment/Plan: Sepsis secondary to healthcare associated pneumonia plus UTI -Sepsis parameters have resolved, she is currently hemodynamically stable, lactic acidosis has cleared, leukocytosis is improving.  Hospital-acquired pneumonia -Culture data remains negative to date. -Continue broad-spectrum antibiotics for another 24 hours and consider narrowing in a.m. based on culture data and clinical improvement.  UTI -Culture pending; cefepime should cover.   Seizure disorder -Continue Keppra  Dementia -At baseline  Hypothyroidism -Continue home dose of Synthroid  COPD/asthma -Stable, continue Singulair, theophylline.  Chronic kidney disease stage III -At baseline      Code Status: DO NOT RESUSCITATE Family Communication: Daughter at bedside updated on plan of care and all questions answered  Disposition Plan: Back to SNF when medically ready, anticipate 24-48 hours   Consultants:  None   Antibiotics:  Vancomycin  Cefepime   Subjective: Awake, nonverbal  Objective: Filed Vitals:   07/26/15 1920 07/26/15 2020 07/27/15 0534 07/27/15 1300  BP: 101/50 94/50 100/58 105/55  Pulse: 99 91 87 89  Temp: 99.7 F (37.6 C) 99.5 F (37.5 C) 98.2 F (36.8 C) 98.7 F (37.1 C)  TempSrc: Oral Oral Oral Oral  Resp: 20 18 20 18   Height:      Weight:      SpO2: 100% 98% 97% 98%    Intake/Output Summary (Last 24 hours) at 07/27/15 1438 Last data filed at 07/27/15 1300  Gross per 24 hour  Intake 1428.75 ml  Output      3 ml  Net 1425.75 ml   Filed Weights   07/26/15 1005 07/26/15 1700  Weight: 46.72 kg (103 lb) 44.815 kg (98 lb 12.8 oz)    Exam:   General:  Awake  Cardiovascular: Regular rate and rhythm  Respiratory: Decreased bilateral breath sounds, no wheezes or crackles  Abdomen: Obese, soft, nontender,  nondistended, positive bowel sounds  Extremities: Trace bilateral edema   Neurologic:  Moves all 4 spontaneously  Data Reviewed: Basic Metabolic Panel:  Recent Labs Lab 07/26/15 1413 07/26/15 1730 07/27/15 0641  NA 137  --  145  K 4.0  --  3.4*  CL 107  --  116*  CO2 24  --  21*  GLUCOSE 169*  --  101*  BUN 59*  --  53*  CREATININE 1.45*  --  1.46*  CALCIUM 8.7*  --  7.7*  MG  --  2.1  --    Liver Function Tests:  Recent Labs Lab 07/26/15 1413  AST 72*  ALT 37  ALKPHOS 90  BILITOT 0.7  PROT 6.6  ALBUMIN 2.8*    Recent Labs Lab 07/26/15 1413  LIPASE 12   No results for input(s): AMMONIA in the last 168 hours. CBC:  Recent Labs Lab 07/26/15 1413 07/27/15 0641  WBC 20.6* 9.7  HGB 11.9* 9.0*  HCT 34.4* 26.0*  MCV 93.2 92.5  PLT 152 122*   Cardiac Enzymes:  Recent Labs Lab 07/26/15 1413 07/26/15 1730 07/26/15 2240 07/27/15 0641  TROPONINI 0.06* 0.07* 0.05* 0.05*   BNP (last 3 results) No results for input(s): BNP in the last 8760 hours.  ProBNP (last 3 results) No results for input(s): PROBNP in the last 8760 hours.  CBG: No results for input(s): GLUCAP in the last 168 hours.  Recent Results (from the past 240 hour(s))  Urine culture     Status: None (Preliminary result)   Collection Time: 07/26/15 10:54  AM  Result Value Ref Range Status   Specimen Description URINE, CATHETERIZED  Final   Special Requests NONE  Final   Culture   Final    TOO YOUNG TO READ Performed at [facility]    Report Status PENDING  Incomplete     Studies: Dg Abd 1 View  07/27/2015  CLINICAL DATA:  Abdominal pain. EXAM: ABDOMEN - 1 VIEW COMPARISON:  CT 07/03/2015.  Abdomen 07/03/2015. FINDINGS: Soft tissue structures are unremarkable. Surgical clips right upper quadrant. Biliary stent in unchanged position from prior CT. Nondistended loops of small and large bowel noted. No free air. Stool noted in colon. Atelectasis and or infiltrate left lung base.  IMPRESSION: 1. Nondistended air-filled loops of small and large bowel noted. Mild ileus cannot be excluded . No free air. Stool noted in colon. 2. Biliary stent again noted in unchanged position from prior CT. Prior cholecystectomy. 3. Atelectasis and or infiltrate left lung base. Electronically Signed   By: Marcello Moores  Register   On: 07/27/2015 07:39   Dg Chest Port 1 View  07/26/2015  CLINICAL DATA:  Nausea, vomiting, abdominal pain and generalized weakness for 2 or 3 days. History of hypertension. Nonsmoker. EXAM: PORTABLE CHEST 1 VIEW COMPARISON:  07/11/2015 and 07/03/2015 FINDINGS: 1128 hours. There is volume loss and new patchy airspace disease at the left lung base. The right lung is clear. The heart size and mediastinal contours are stable. No pneumothorax or significant pleural effusion is seen. The patient is status post lower cervical fusion. Advanced arthropathic changes are present at both shoulders with evidence of chronic rotator cuff tears. IMPRESSION: New patchy left lower lobe airspace disease suspicious for pneumonia. Followup PA and lateral chest X-ray is recommended in 3-4 weeks following trial of antibiotic therapy to ensure resolution and exclude underlying malignancy. Electronically Signed   By: Richardean Sale M.D.   On: 07/26/2015 11:46    Scheduled Meds: . aspirin EC  81 mg Oral Daily  . calcium-vitamin D  1 tablet Oral Daily  . ceFEPime (MAXIPIME) IV  1 g Intravenous Q24H  . enoxaparin (LOVENOX) injection  30 mg Subcutaneous Q24H  . guaiFENesin  600 mg Oral BID  . levETIRAcetam  250 mg Oral Daily  . levothyroxine  25 mcg Oral QAC breakfast  . montelukast  10 mg Oral q morning - 10a  . tamsulosin  0.4 mg Oral Daily  . theophylline  400 mg Oral Daily  . [START ON 07/28/2015] vancomycin  500 mg Intravenous Q48H   Continuous Infusions: . sodium chloride 75 mL/hr at 07/26/15 2239    Principal Problem:   Sepsis ([place]) Active Problems:   Essential hypertension   COPD (chronic  obstructive pulmonary disease) (HCC)   GERD   Fever   HCAP (healthcare-associated pneumonia)   Elevated troponin   Pyrexia   Urinary tract infectious disease    Time spent: 25 minutes. Greater than 50% of this time was spent in direct contact with the patient coordinating care.    Lelon Frohlich  [facility] Pager [phone]  If 7PM-7AM, please contact night-coverage at [email or web address], password [facility] 07/27/2015, 2:38 PM  LOS: 1 day

## 2015-07-27 NOTE — Clinical Social Work Note (Signed)
CSW has left report for weekend CSWs. [facility] states that they can admit patient over the weekend if she is medically stable for discharge. Instructions left in report for weekend CSW.   Liz Beach MSW, [place], [place], [id number]

## 2015-07-27 NOTE — Progress Notes (Signed)
CCMD notified RN of Atrial Tachycardia. Pt at rest. VSS. No distress. MD notified/aware. Oswald Hillock, RN

## 2015-07-27 NOTE — Clinical Social Work Note (Signed)
Clinical Social Work Assessment  Patient Details  Name: [name] MRN: [id number] Date of Birth: 08/27/1912  Date of referral:  07/27/15               Reason for consult:  Discharge Planning, Facility Placement                Permission sought to share information with:  Facility Contact Representative, Family Supports Permission granted to share information::  Yes, Verbal Permission Granted  Name::     [name], [name], [name]  Agency::  [facility]  Relationship::     Contact Information:     Housing/Transportation Living arrangements for the past 2 months:  Assisted Living Facility Source of Information:  Adult Children, Guardian Patient Interpreter Needed:  None Criminal Activity/Legal Involvement Pertinent to Current Situation/Hospitalization:  No - Comment as needed Significant Relationships:  Adult Children, Other(Comment) (Legal Guardian) Lives with:  Facility Resident Do you feel safe going back to the place where you live?  Yes Need for family participation in patient care:  Yes (Comment)  Care giving concerns:  The patient, daughter, and legal guardian [name] plan for the patient return [facility] at discharge.   Social Worker assessment / plan:  CSW met with patient and daughter at bedside and spoke with legal guardian by phone to complete assessment. Patient and family both plan for the patient to return to [facility]. CSW answered guardian [name]'s questions and explained CSW role. [name] plans for the patient to return to [facility] as long as the facility can continue to manage her needs at discharge. CSW will assist with discharge.  Employment status:  Retired Insurance information:  Medicaid In State, Managed Medicare PT Recommendations:  Not assessed at this time Information / Referral to community resources:  Other (Comment Required) (Information will be sent to [facility] )  Patient/Family's Response to care:  Patient and  family appear happy with the care the patient has received.  Patient/Family's Understanding of and Emotional Response to Diagnosis, Current Treatment, and Prognosis:  The patient and family appear to have fair insight into reason for admission. [name] (guardian) has clear understanding of reason for admission and post DC needs.   Emotional Assessment Appearance:  Appears stated age Attitude/Demeanor/Rapport:  Unable to Assess Affect (typically observed):  Unable to Assess Orientation:  Oriented to Self Alcohol / Substance use:  Not Applicable Psych involvement (Current and /or in the community):  No (Comment)  Discharge Needs  Concerns to be addressed:  Discharge Planning Concerns Readmission within the last 30 days:  Yes Current discharge risk:  Chronically ill, Physical Impairment, Cognitively Impaired Barriers to Discharge:  Continued Medical Work up   ,  B, LCSW 07/27/2015, 2:04 PM  

## 2015-07-27 NOTE — Care Management Important Message (Signed)
Important Message  Patient Details  Name: [name] MRN: [id number] Date of Birth: December 09, 1912   Medicare Important Message Given:  Yes    Sherald Barge, RN 07/27/2015, 12:23 PM

## 2015-07-27 NOTE — Care Management Note (Signed)
Case Management Note  Patient Details  Name: JAMEY REMER MRN: [id number] Date of Birth: Nov 23, 1912  Subjective/Objective:                  Pt admitted with sepsis. Pt is from [facility]. Pt is not active with [facility] services prior to admission. Pt will neet PT eval prior to DC to determine if pt is still appropriate for ALF level of care. CSW is aware of pt's DC needs and is following pt's progression also.   Action/Plan: If pt were to need HH services at ALF, [facility] preference is [facility]. Will cont to follow.   Expected Discharge Date:    07/31/2015             Expected Discharge Plan:  Assisted Living / Rest Home  In-House Referral:  Clinical Social Work  Discharge planning Services  CM Consult  Post Acute Care Choice:  NA Choice offered to:  NA  DME Arranged:    DME Agency:     HH Arranged:    HH Agency:     Status of Service:  In process, will continue to follow  Medicare Important Message Given:  Yes Date Medicare IM Given:    Medicare IM give by:    Date Additional Medicare IM Given:    Additional Medicare Important Message give by:     If discussed at [place] of Stay Meetings, dates discussed:    Additional Comments:  Sherald Barge, RN 07/27/2015, 12:23 PM

## 2015-07-28 DIAGNOSIS — I10 Essential (primary) hypertension: Secondary | ICD-10-CM

## 2015-07-28 DIAGNOSIS — R7989 Other specified abnormal findings of blood chemistry: Secondary | ICD-10-CM

## 2015-07-28 LAB — MRSA PCR SCREENING: MRSA BY PCR: NEGATIVE

## 2015-07-28 NOTE — Progress Notes (Signed)
[facility] PROGRESS NOTE  HENRENE SUBER [id number] DOB: 1913/03/20 DOA: 07/26/2015 PCP: [name], MD  Assessment/Plan: Sepsis secondary to healthcare associated pneumonia plus UTI -Sepsis parameters have resolved, she is currently hemodynamically stable, lactic acidosis has cleared, leukocytosis is improving.  Hospital-acquired pneumonia -Culture data remains negative to date. -Continue broad-spectrum antibiotics for another 24 hours and consider narrowing in a.m. based on culture data and clinical improvement. -Check MRSA PCR and consider DC vanc if negative.  UTI, GNR -Culture pending; cefepime should cover.   Seizure disorder -Continue Keppra  Dementia -At baseline  Hypothyroidism -Continue home dose of Synthroid  COPD/asthma -Stable, continue Singulair, theophylline.  Chronic kidney disease stage III -At baseline    Code Status: DO NOT RESUSCITATE Family Communication: Daughter at bedside updated on plan of care and all questions answered 3/10 Disposition Plan: Back to SNF when medically ready, anticipate 24-48 hours   Consultants:  None   Antibiotics:  Vancomycin  Cefepime   Subjective: Awake, nonverbal  Objective: Filed Vitals:   07/27/15 0534 07/27/15 1300 07/27/15 2150 07/28/15 0540  BP: 100/58 105/55 132/63 149/68  Pulse: 87 89 87 81  Temp: 98.2 F (36.8 C) 98.7 F (37.1 C) 99.2 F (37.3 C) 97.9 F (36.6 C)  TempSrc: Oral Oral Oral Oral  Resp: 20 18 18 18   Height:      Weight:      SpO2: 97% 98% 100% 100%    Intake/Output Summary (Last 24 hours) at 07/28/15 1548 Last data filed at 07/28/15 1410  Gross per 24 hour  Intake   1210 ml  Output      1 ml  Net   1209 ml   Filed Weights   07/26/15 1005 07/26/15 1700  Weight: 46.72 kg (103 lb) 44.815 kg (98 lb 12.8 oz)    Exam:   General:  Awake  Cardiovascular: Regular rate and rhythm  Respiratory: Decreased bilateral breath sounds, no wheezes  or crackles  Abdomen: Obese, soft, nontender, nondistended, positive bowel sounds  Extremities: Trace bilateral edema   Neurologic:  Moves all 4 spontaneously  Data Reviewed: Basic Metabolic Panel:  Recent Labs Lab 07/26/15 1413 07/26/15 1730 07/27/15 0641  NA 137  --  145  K 4.0  --  3.4*  CL 107  --  116*  CO2 24  --  21*  GLUCOSE 169*  --  101*  BUN 59*  --  53*  CREATININE 1.45*  --  1.46*  CALCIUM 8.7*  --  7.7*  MG  --  2.1  --    Liver Function Tests:  Recent Labs Lab 07/26/15 1413  AST 72*  ALT 37  ALKPHOS 90  BILITOT 0.7  PROT 6.6  ALBUMIN 2.8*    Recent Labs Lab 07/26/15 1413  LIPASE 12   No results for input(s): AMMONIA in the last 168 hours. CBC:  Recent Labs Lab 07/26/15 1413 07/27/15 0641  WBC 20.6* 9.7  HGB 11.9* 9.0*  HCT 34.4* 26.0*  MCV 93.2 92.5  PLT 152 122*   Cardiac Enzymes:  Recent Labs Lab 07/26/15 1413 07/26/15 1730 07/26/15 2240 07/27/15 0641  TROPONINI 0.06* 0.07* 0.05* 0.05*   BNP (last 3 results) No results for input(s): BNP in the last 8760 hours.  ProBNP (last 3 results) No results for input(s): PROBNP in the last 8760 hours.  CBG: No results for input(s): GLUCAP in the last 168 hours.  Recent Results (from the past 240 hour(s))  Urine culture  Status: None (Preliminary result)   Collection Time: 07/26/15 10:54 AM  Result Value Ref Range Status   Specimen Description URINE, CATHETERIZED  Final   Special Requests NONE  Final   Culture   Final    >=100,000 COLONIES/mL GRAM NEGATIVE RODS Performed at [facility]    Report Status PENDING  Incomplete     Studies: Dg Abd 1 View  07/27/2015  CLINICAL DATA:  Abdominal pain. EXAM: ABDOMEN - 1 VIEW COMPARISON:  CT 07/03/2015.  Abdomen 07/03/2015. FINDINGS: Soft tissue structures are unremarkable. Surgical clips right upper quadrant. Biliary stent in unchanged position from prior CT. Nondistended loops of small and large bowel noted. No free air.  Stool noted in colon. Atelectasis and or infiltrate left lung base. IMPRESSION: 1. Nondistended air-filled loops of small and large bowel noted. Mild ileus cannot be excluded . No free air. Stool noted in colon. 2. Biliary stent again noted in unchanged position from prior CT. Prior cholecystectomy. 3. Atelectasis and or infiltrate left lung base. Electronically Signed   By: Marcello Moores  Register   On: 07/27/2015 07:39    Scheduled Meds: . aspirin EC  81 mg Oral Daily  . calcium-vitamin D  1 tablet Oral Daily  . ceFEPime (MAXIPIME) IV  1 g Intravenous Q24H  . enoxaparin (LOVENOX) injection  30 mg Subcutaneous Q24H  . guaiFENesin  600 mg Oral BID  . levETIRAcetam  250 mg Oral Daily  . levothyroxine  25 mcg Oral QAC breakfast  . montelukast  10 mg Oral q morning - 10a  . tamsulosin  0.4 mg Oral Daily  . theophylline  400 mg Oral Daily  . vancomycin  500 mg Intravenous Q48H   Continuous Infusions: . sodium chloride 75 mL/hr at 07/27/15 1528    Principal Problem:   Sepsis ([place]) Active Problems:   Essential hypertension   COPD (chronic obstructive pulmonary disease) (HCC)   GERD   Fever   HCAP (healthcare-associated pneumonia)   Elevated troponin   Pyrexia   Urinary tract infectious disease    Time spent: 25 minutes. Greater than 50% of this time was spent in direct contact with the patient coordinating care.    Lelon Frohlich  [facility] Pager [phone]  If 7PM-7AM, please contact night-coverage at [email or web address], password [facility] 07/28/2015, 3:48 PM  LOS: 2 days

## 2015-07-29 LAB — BASIC METABOLIC PANEL
ANION GAP: 8 (ref 5–15)
BUN: 39 mg/dL — ABNORMAL HIGH (ref 6–20)
CHLORIDE: 113 mmol/L — AB (ref 101–111)
CO2: 21 mmol/L — ABNORMAL LOW (ref 22–32)
Calcium: 8.4 mg/dL — ABNORMAL LOW (ref 8.9–10.3)
Creatinine, Ser: 1.19 mg/dL — ABNORMAL HIGH (ref 0.44–1.00)
GFR, EST AFRICAN AMERICAN: 42 mL/min — AB (ref >60)
GFR, EST NON AFRICAN AMERICAN: 36 mL/min — AB (ref >60)
Glucose, Bld: 100 mg/dL — ABNORMAL HIGH (ref 65–99)
POTASSIUM: 3.5 mmol/L (ref 3.5–5.1)
SODIUM: 142 mmol/L (ref 135–145)

## 2015-07-29 LAB — URINE CULTURE

## 2015-07-29 LAB — CBC
HEMATOCRIT: 31 % — AB (ref 36.0–46.0)
HEMOGLOBIN: 10.7 g/dL — AB (ref 12.0–15.0)
MCH: 32.5 pg (ref 26.0–34.0)
MCHC: 34.5 g/dL (ref 30.0–36.0)
MCV: 94.2 fL (ref 78.0–100.0)
Platelets: 154 10*3/uL (ref 150–400)
RBC: 3.29 MIL/uL — AB (ref 3.87–5.11)
RDW: 15.5 % (ref 11.5–15.5)
WBC: 7.9 10*3/uL (ref 4.0–10.5)

## 2015-07-29 MED ORDER — GUAIFENESIN 100 MG/5ML PO SOLN
5.0000 mL | Freq: Once | ORAL | Status: AC
  Administered 2015-07-29: 100 mg via ORAL
  Filled 2015-07-29: qty 5

## 2015-07-29 MED ORDER — SULFAMETHOXAZOLE-TRIMETHOPRIM 800-160 MG PO TABS
1.0000 | ORAL_TABLET | Freq: Two times a day (BID) | ORAL | Status: DC
Start: 2015-07-29 — End: 2015-09-07

## 2015-07-29 MED ORDER — AMOXICILLIN-POT CLAVULANATE 500-125 MG PO TABS: 1.0000 | ORAL_TABLET | Freq: Three times a day (TID) | ORAL | Status: DC

## 2015-07-29 NOTE — Progress Notes (Signed)
Notified Lysle Rubens that the patients was being discharged back to [facility]. She verbalized understanding.

## 2015-07-29 NOTE — Discharge Summary (Signed)
Physician Discharge Summary  [name] [id number] DOB: 1912-09-06 DOA: 07/26/2015  PCP: [name] Kilts, MD  Admit date: 07/26/2015 Discharge date: 07/29/2015  Time spent: 45 minutes  Recommendations for Outpatient Follow-up:  -We'll be discharge home today. -Advised to follow-up with primary care provider in 2 weeks.   Discharge Diagnoses:  Principal Problem:   Sepsis ([place]) Active Problems:   Essential hypertension   COPD (chronic obstructive pulmonary disease) (HCC)   GERD   Fever   HCAP (healthcare-associated pneumonia)   Elevated troponin   Pyrexia   Urinary tract infectious disease   Discharge Condition: Stable and improved  Filed Weights   07/26/15 1005 07/26/15 1700  Weight: 46.72 kg (103 lb) 44.815 kg (98 lb 12.8 oz)    History of present illness:  As per Dr. Tamala [name] on 3/9: [name] is 82 year old female with a past medical history significant fordementia, hypothyroidism, Jerrye Bushy w/ history of esophageal ulcer, asthma/COPD, DM type 2, osteoarthritis, diverticulitis, and CVA, who presents from nursing home reported to be having complaints of nausea, vomiting and abdominal pain. Patient has dementia and therefore limits the ability to give history. From report, it seems these symptoms have been ongoing over the last 2-3 days. No reported fevers documented at the facility. Associated symptoms included lethargy and generalized weakness.  Upon admission patient was found to have a heart rate 122, respiratory rate 31, and temperature 100.53F. Patient was not found to have orthostatic vital signs. Initial workup revealed WBC 20.6, creatinine 1.45, BUN 59, lactic acid 1.08, and troponin 0.06. Analysis showing many bacteria, small leukocyte esterase, positive nitrite, and too numerous to count squamous epithelial cells and WBCs. Chest x-ray showing a new patchy left lower lobe opacity suggestive of pneumonia.   Hospital Course:   Sepsis secondary to  healthcare associated pneumonia plus UTI -Sepsis parameters have resolved, she is currently hemodynamically stable, lactic acidosis has cleared, leukocytosis has resolved.  Hospital-acquired pneumonia -Culture data remains negative to date. -Will transition to augmentin to continue 5 days of treatment.  E Coli ESBL UTI -Sensitive to bactrim. -Will DC on a 5 day course of bactrim.  Seizure disorder -Continue Keppra  Dementia -At baseline  Hypothyroidism -Continue home dose of Synthroid  COPD/asthma -Stable, continue Singulair, theophylline.  Chronic kidney disease stage III -At baseline   Procedures:  None   Consultations:  None  Discharge Instructions  Discharge Instructions    Diet - low sodium heart healthy    Complete by:  As directed      Increase activity slowly    Complete by:  As directed             Medication List    STOP taking these medications        ciprofloxacin 500 MG tablet  Commonly known as:  CIPRO      TAKE these medications        acetaminophen 325 MG tablet  Commonly known as:  TYLENOL  Take 2 tablets (650 mg total) by mouth every 6 (six) hours as needed for mild pain (or Fever >/= 101).     amoxicillin-clavulanate 500-125 MG tablet  Commonly known as:  AUGMENTIN  Take 1 tablet (500 mg total) by mouth 3 (three) times daily.     aspirin 81 MG tablet  Take 81 mg by mouth daily.     CITRACAL PETITES/VITAMIN D 200-250 MG-UNIT Tabs  Generic drug:  Calcium Citrate-Vitamin D  Take 1 tablet by mouth daily.  Calcium Citrate-Vitamin D 200-250 MG-UNIT Tabs  Take 1 tablet by mouth daily. Reported on 07/26/2015     EARWAX TREATMENT DROPS 6.5 % otic solution  Generic drug:  carbamide peroxide  Place 5 drops into both ears 2 (two) times daily as needed (build up).     levETIRAcetam 250 MG tablet  Commonly known as:  KEPPRA  Take 1 tablet (250 mg total) by mouth daily.     levothyroxine 25 MCG tablet  Commonly known as:   SYNTHROID, LEVOTHROID  Take 25 mcg by mouth daily before breakfast.     montelukast 10 MG tablet  Commonly known as:  SINGULAIR  Take 10 mg by mouth every morning.     polyethylene glycol packet  Commonly known as:  MIRALAX / GLYCOLAX  Take 17 g by mouth daily as needed for mild constipation.     solifenacin 5 MG tablet  Commonly known as:  VESICARE  Take 5 mg by mouth daily.     sulfamethoxazole-trimethoprim 800-160 MG tablet  Commonly known as:  BACTRIM DS  Take 1 tablet by mouth 2 (two) times daily.     tamsulosin 0.4 MG Caps capsule  Commonly known as:  FLOMAX  Take 1 capsule (0.4 mg total) by mouth daily.     theophylline 400 MG 24 hr tablet  Commonly known as:  UNIPHYL  Take 400 mg by mouth daily.       No Known Allergies     Follow-up Information    Follow up with [name] Kilts, MD. Schedule an appointment as soon as possible for a visit in 2 weeks.   Specialty:  Family Medicine   Contact information:   [address] [place] [id number] [phone]        The results of significant diagnostics from this hospitalization (including imaging, microbiology, ancillary and laboratory) are listed below for reference.    Significant Diagnostic Studies: Ct Abdomen Pelvis Wo Contrast  07/03/2015  CLINICAL DATA:  Nausea, vomiting, and epigastric pain EXAM: CT ABDOMEN AND PELVIS WITHOUT CONTRAST TECHNIQUE: Multidetector CT imaging of the abdomen and pelvis was performed following the standard protocol without IV contrast. COMPARISON:  06/09/2014 FINDINGS: Lower chest and abdominal wall: Airway thickening and occasional mucoid impaction in the lower lobes. Accounting for atelectasis, no pneumonia. Hepatobiliary: No focal liver abnormality.Cholecystectomy. Chronic common bile duct dilatation with indwelling stent from the lower common bile duct to the duodenum. No neighboring calcified choledocholithiasis. Similar CBD dilatation at 11 mm. Pancreas: Generalized  atrophy. Spleen: Unremarkable. Adrenals/Urinary Tract: Negative adrenals. No hydronephrosis or stone. 14 mm cyst in the interpolar right kidney. Unremarkable bladder. Reproductive:Unremarkable for age. Stomach/Bowel: No obstruction or inflammatory wall thickening. Extensive colonic and small bowel diverticulosis. No active inflammation. No appendicitis. Vascular/Lymphatic: Extensive atherosclerotic calcification. No evidence of acute vascular disease. No mass or adenopathy. Peritoneal: No ascites or pneumoperitoneum. Musculoskeletal: No acute finding. Advanced lumbar facet arthropathy with multilevel listhesis and advanced disc degeneration. IMPRESSION: 1. No acute finding or change since 2016. No explanation for symptoms. 2. Chronic findings are described above. Electronically Signed   By: Monte Fantasia M.D.   On: 07/03/2015 16:18   Dg Chest 2 View  07/11/2015  CLINICAL DATA:  One 82 year old female who fell from chair today at church. Initial encounter. EXAM: CHEST  2 VIEW COMPARISON:  07/03/2015 and earlier. FINDINGS: Upright AP and lateral views of the chest. Skin fold artifact over the right chest. Chronic large lung volumes. Stable mild cardiomegaly and tortuosity of  the thoracic aorta. Other mediastinal contours are within normal limits. Visualized tracheal air column is within normal limits. No pneumothorax, pulmonary edema, pleural effusion or confluent pulmonary opacity. Osteopenia. Chronic cervical ACDF hardware. No acute osseous abnormality identified. Stable cholecystectomy clips. Calcified aortic atherosclerosis. IMPRESSION: No acute cardiopulmonary abnormality or acute traumatic injury identified. Electronically Signed   By: Genevie Ann M.D.   On: 07/11/2015 13:58   Dg Lumbar Spine Complete  07/11/2015  CLINICAL DATA:  Status post fall from a chair today. Low back pain. Initial encounter. EXAM: LUMBAR SPINE - COMPLETE 4+ VIEW COMPARISON:  CT abdomen and pelvis 07/03/2015. FINDINGS: No  fracture is identified. Anterolisthesis L3 on L4 and L4 on L5 is unchanged. The patient has advanced lower lumbar facet degenerative disease. Biliary stent is noted. There is contrast material in the colon with innumerable diverticula noted. IMPRESSION: No acute abnormality. Lower lumbar spondylosis. Diverticulosis. Electronically Signed   By: Inge Rise M.D.   On: 07/11/2015 14:00   Dg Abd 1 View  07/27/2015  CLINICAL DATA:  Abdominal pain. EXAM: ABDOMEN - 1 VIEW COMPARISON:  CT 07/03/2015.  Abdomen 07/03/2015. FINDINGS: Soft tissue structures are unremarkable. Surgical clips right upper quadrant. Biliary stent in unchanged position from prior CT. Nondistended loops of small and large bowel noted. No free air. Stool noted in colon. Atelectasis and or infiltrate left lung base. IMPRESSION: 1. Nondistended air-filled loops of small and large bowel noted. Mild ileus cannot be excluded . No free air. Stool noted in colon. 2. Biliary stent again noted in unchanged position from prior CT. Prior cholecystectomy. 3. Atelectasis and or infiltrate left lung base. Electronically Signed   By: Marcello Moores  Register   On: 07/27/2015 07:39   Ct Head Wo Contrast  07/11/2015  CLINICAL DATA:  [place] over chair is with trauma to the back of the head. Dementia. EXAM: CT HEAD WITHOUT CONTRAST CT CERVICAL SPINE WITHOUT CONTRAST TECHNIQUE: Multidetector CT imaging of the head and cervical spine was performed following the standard protocol without intravenous contrast. Multiplanar CT image reconstructions of the cervical spine were also generated. COMPARISON:  04/26/2015.  04/14/2015. FINDINGS: CT HEAD FINDINGS The brain shows mild generalized atrophy. There is no evidence of acute infarction, mass lesion, hemorrhage, hydrocephalus or extra-axial collection. Moderate chronic small-vessel ischemic changes affect the deep white matter. No skull fracture. No fluid in the sinuses, middle ears or mastoids. There is atherosclerotic  calcification of the major vessels at the base of the brain. CT CERVICAL SPINE FINDINGS There has been previous ACDF from C4 through C7. No complications seen in that region. There is no evidence of fracture or traumatic malalignment. There is degenerative spondylosis at C3-4. There is degenerative facet arthropathy at C2-3 and C7-T1. IMPRESSION: Head CT: No acute or traumatic finding. Atrophy and chronic small vessel disease. Cervical spine CT: No acute or traumatic finding. Previous ACDF C4 through C7. Degenerative spondylosis and facet arthropathy above and below that. Electronically Signed   By: Nelson Chimes M.D.   On: 07/11/2015 13:58   Ct Cervical Spine Wo Contrast  07/11/2015  CLINICAL DATA:  [place] over chair is with trauma to the back of the head. Dementia. EXAM: CT HEAD WITHOUT CONTRAST CT CERVICAL SPINE WITHOUT CONTRAST TECHNIQUE: Multidetector CT imaging of the head and cervical spine was performed following the standard protocol without intravenous contrast. Multiplanar CT image reconstructions of the cervical spine were also generated. COMPARISON:  04/26/2015.  04/14/2015. FINDINGS: CT HEAD FINDINGS The brain shows mild generalized atrophy. There is  no evidence of acute infarction, mass lesion, hemorrhage, hydrocephalus or extra-axial collection. Moderate chronic small-vessel ischemic changes affect the deep white matter. No skull fracture. No fluid in the sinuses, middle ears or mastoids. There is atherosclerotic calcification of the major vessels at the base of the brain. CT CERVICAL SPINE FINDINGS There has been previous ACDF from C4 through C7. No complications seen in that region. There is no evidence of fracture or traumatic malalignment. There is degenerative spondylosis at C3-4. There is degenerative facet arthropathy at C2-3 and C7-T1. IMPRESSION: Head CT: No acute or traumatic finding. Atrophy and chronic small vessel disease. Cervical spine CT: No acute or traumatic finding. Previous ACDF  C4 through C7. Degenerative spondylosis and facet arthropathy above and below that. Electronically Signed   By: Nelson Chimes M.D.   On: 07/11/2015 13:58   Dg Chest Port 1 View  07/26/2015  CLINICAL DATA:  Nausea, vomiting, abdominal pain and generalized weakness for 2 or 3 days. History of hypertension. Nonsmoker. EXAM: PORTABLE CHEST 1 VIEW COMPARISON:  07/11/2015 and 07/03/2015 FINDINGS: 1128 hours. There is volume loss and new patchy airspace disease at the left lung base. The right lung is clear. The heart size and mediastinal contours are stable. No pneumothorax or significant pleural effusion is seen. The patient is status post lower cervical fusion. Advanced arthropathic changes are present at both shoulders with evidence of chronic rotator cuff tears. IMPRESSION: New patchy left lower lobe airspace disease suspicious for pneumonia. Followup PA and lateral chest X-ray is recommended in 3-4 weeks following trial of antibiotic therapy to ensure resolution and exclude underlying malignancy. Electronically Signed   By: Richardean Sale M.D.   On: 07/26/2015 11:46   Dg Abd Acute W/chest  07/03/2015  CLINICAL DATA:  Nausea, vomiting, epigastric pain beginning this morning. EXAM: DG ABDOMEN ACUTE W/ 1V CHEST COMPARISON:  05/07/2015 FINDINGS: Mild hyperinflation of the lungs compatible with COPD. Heart is normal size. No confluent opacities or effusions. Nonobstructive bowel gas pattern. Moderate stool burden in the colon. Biliary stents noted in the right upper quadrant, in similar position to prior CT. No free air organomegaly. Prior cholecystectomy. IMPRESSION: Moderate stool burden. No evidence of bowel obstruction or free air. COPD.  No active cardiopulmonary disease. Electronically Signed   By: Rolm Baptise M.D.   On: 07/03/2015 12:38    Microbiology: Recent Results (from the past 240 hour(s))  Urine culture     Status: None   Collection Time: 07/26/15 10:54 AM  Result Value Ref Range Status    Specimen Description URINE, CATHETERIZED  Final   Special Requests NONE  Final   Culture   Final    >=100,000 COLONIES/mL KLEBSIELLA PNEUMONIAE Confirmed Extended Spectrum Beta-Lactamase Producer (ESBL) Performed at [facility]    Report Status 07/29/2015 FINAL  Final   Organism ID, Bacteria KLEBSIELLA PNEUMONIAE  Final      Susceptibility   Klebsiella pneumoniae - MIC*    AMPICILLIN >=32 RESISTANT Resistant     CEFAZOLIN <=4 RESISTANT Resistant     CEFTRIAXONE <=1 RESISTANT Resistant     CIPROFLOXACIN 1 SENSITIVE Sensitive     GENTAMICIN <=1 SENSITIVE Sensitive     IMIPENEM <=0.25 SENSITIVE Sensitive     NITROFURANTOIN 64 INTERMEDIATE Intermediate     TRIMETH/SULFA <=20 SENSITIVE Sensitive     AMPICILLIN/SULBACTAM >=32 RESISTANT Resistant     PIP/TAZO 8 SENSITIVE Sensitive     * >=100,000 COLONIES/mL KLEBSIELLA PNEUMONIAE  Culture, blood (routine x 2)  Status: None (Preliminary result)   Collection Time: 07/26/15  2:13 PM  Result Value Ref Range Status   Specimen Description BLOOD PICC LINE DRAWN BY RN  Final   Special Requests   Final    BOTTLES DRAWN AEROBIC AND ANAEROBIC AEB=10CC ANA=8CC   Culture NO GROWTH 3 DAYS  Final   Report Status PENDING  Incomplete  Culture, blood (routine x 2)     Status: None (Preliminary result)   Collection Time: 07/26/15  5:30 PM  Result Value Ref Range Status   Specimen Description BLOOD PICC LINE DRAWN BY RN  Final   Special Requests BOTTLES DRAWN AEROBIC AND ANAEROBIC [place]  Final   Culture NO GROWTH 3 DAYS  Final   Report Status PENDING  Incomplete  MRSA PCR Screening     Status: None   Collection Time: 07/28/15  5:00 PM  Result Value Ref Range Status   MRSA by PCR NEGATIVE NEGATIVE Final    Comment:        The GeneXpert MRSA Assay (FDA approved for NASAL specimens only), is one component of a comprehensive MRSA colonization surveillance program. It is not intended to diagnose MRSA infection nor to guide or monitor  treatment for MRSA infections.      Labs: Basic Metabolic Panel:  Recent Labs Lab 07/26/15 1413 07/26/15 1730 07/27/15 0641 07/29/15 0627  NA 137  --  145 142  K 4.0  --  3.4* 3.5  CL 107  --  116* 113*  CO2 24  --  21* 21*  GLUCOSE 169*  --  101* 100*  BUN 59*  --  53* 39*  CREATININE 1.45*  --  1.46* 1.19*  CALCIUM 8.7*  --  7.7* 8.4*  MG  --  2.1  --   --    Liver Function Tests:  Recent Labs Lab 07/26/15 1413  AST 72*  ALT 37  ALKPHOS 90  BILITOT 0.7  PROT 6.6  ALBUMIN 2.8*    Recent Labs Lab 07/26/15 1413  LIPASE 12   No results for input(s): AMMONIA in the last 168 hours. CBC:  Recent Labs Lab 07/26/15 1413 07/27/15 0641 07/29/15 0627  WBC 20.6* 9.7 7.9  HGB 11.9* 9.0* 10.7*  HCT 34.4* 26.0* 31.0*  MCV 93.2 92.5 94.2  PLT 152 122* 154   Cardiac Enzymes:  Recent Labs Lab 07/26/15 1413 07/26/15 1730 07/26/15 2240 07/27/15 0641  TROPONINI 0.06* 0.07* 0.05* 0.05*   BNP: BNP (last 3 results) No results for input(s): BNP in the last 8760 hours.  ProBNP (last 3 results) No results for input(s): PROBNP in the last 8760 hours.  CBG: No results for input(s): GLUCAP in the last 168 hours.     SignedLelon Frohlich  [facility] Pager: [phone] 07/29/2015, 2:15 PM

## 2015-07-31 LAB — CULTURE, BLOOD (ROUTINE X 2)
Culture: NO GROWTH
Culture: NO GROWTH

## 2015-08-03 DIAGNOSIS — N390 Urinary tract infection, site not specified: Secondary | ICD-10-CM | POA: Diagnosis not present

## 2015-08-03 DIAGNOSIS — Z7689 Persons encountering health services in other specified circumstances: Secondary | ICD-10-CM | POA: Diagnosis not present

## 2015-08-03 DIAGNOSIS — Z1389 Encounter for screening for other disorder: Secondary | ICD-10-CM | POA: Diagnosis not present

## 2015-08-17 DIAGNOSIS — N390 Urinary tract infection, site not specified: Secondary | ICD-10-CM | POA: Diagnosis not present

## 2015-08-27 DIAGNOSIS — Z79891 Long term (current) use of opiate analgesic: Secondary | ICD-10-CM | POA: Diagnosis not present

## 2015-08-27 DIAGNOSIS — Z9181 History of falling: Secondary | ICD-10-CM | POA: Diagnosis not present

## 2015-08-27 DIAGNOSIS — J449 Chronic obstructive pulmonary disease, unspecified: Secondary | ICD-10-CM | POA: Diagnosis not present

## 2015-08-27 DIAGNOSIS — E875 Hyperkalemia: Secondary | ICD-10-CM | POA: Diagnosis not present

## 2015-08-27 DIAGNOSIS — M1990 Unspecified osteoarthritis, unspecified site: Secondary | ICD-10-CM | POA: Diagnosis not present

## 2015-08-27 DIAGNOSIS — Z792 Long term (current) use of antibiotics: Secondary | ICD-10-CM | POA: Diagnosis not present

## 2015-08-27 DIAGNOSIS — M6281 Muscle weakness (generalized): Secondary | ICD-10-CM | POA: Diagnosis not present

## 2015-08-28 DIAGNOSIS — M1990 Unspecified osteoarthritis, unspecified site: Secondary | ICD-10-CM | POA: Diagnosis not present

## 2015-08-28 DIAGNOSIS — J449 Chronic obstructive pulmonary disease, unspecified: Secondary | ICD-10-CM | POA: Diagnosis not present

## 2015-08-28 DIAGNOSIS — M6281 Muscle weakness (generalized): Secondary | ICD-10-CM | POA: Diagnosis not present

## 2015-09-07 ENCOUNTER — Inpatient Hospital Stay (HOSPITAL_COMMUNITY)
Admission: EM | Admit: 2015-09-07 | Discharge: 2015-09-13 | DRG: 871 | Disposition: A | Discharge status: Hospice | Payer: Medicare Other | Attending: Internal Medicine | Admitting: Internal Medicine

## 2015-09-07 ENCOUNTER — Emergency Department (HOSPITAL_COMMUNITY): Payer: Medicare Other

## 2015-09-07 ENCOUNTER — Encounter (HOSPITAL_COMMUNITY): Payer: Self-pay

## 2015-09-07 DIAGNOSIS — Z66 Do not resuscitate: Secondary | ICD-10-CM | POA: Diagnosis not present

## 2015-09-07 DIAGNOSIS — I959 Hypotension, unspecified: Secondary | ICD-10-CM | POA: Diagnosis not present

## 2015-09-07 DIAGNOSIS — Z8673 Personal history of transient ischemic attack (TIA), and cerebral infarction without residual deficits: Secondary | ICD-10-CM | POA: Diagnosis not present

## 2015-09-07 DIAGNOSIS — Z7982 Long term (current) use of aspirin: Secondary | ICD-10-CM | POA: Diagnosis not present

## 2015-09-07 DIAGNOSIS — R569 Unspecified convulsions: Secondary | ICD-10-CM | POA: Diagnosis not present

## 2015-09-07 DIAGNOSIS — I10 Essential (primary) hypertension: Secondary | ICD-10-CM | POA: Diagnosis present

## 2015-09-07 DIAGNOSIS — Z833 Family history of diabetes mellitus: Secondary | ICD-10-CM

## 2015-09-07 DIAGNOSIS — Z8711 Personal history of peptic ulcer disease: Secondary | ICD-10-CM | POA: Diagnosis not present

## 2015-09-07 DIAGNOSIS — A419 Sepsis, unspecified organism: Secondary | ICD-10-CM | POA: Diagnosis not present

## 2015-09-07 DIAGNOSIS — E11649 Type 2 diabetes mellitus with hypoglycemia without coma: Secondary | ICD-10-CM | POA: Diagnosis present

## 2015-09-07 DIAGNOSIS — R112 Nausea with vomiting, unspecified: Secondary | ICD-10-CM | POA: Diagnosis not present

## 2015-09-07 DIAGNOSIS — E860 Dehydration: Secondary | ICD-10-CM | POA: Diagnosis present

## 2015-09-07 DIAGNOSIS — F0390 Unspecified dementia without behavioral disturbance: Secondary | ICD-10-CM | POA: Diagnosis present

## 2015-09-07 DIAGNOSIS — J438 Other emphysema: Secondary | ICD-10-CM

## 2015-09-07 DIAGNOSIS — Z515 Encounter for palliative care: Secondary | ICD-10-CM | POA: Diagnosis present

## 2015-09-07 DIAGNOSIS — J449 Chronic obstructive pulmonary disease, unspecified: Secondary | ICD-10-CM | POA: Diagnosis present

## 2015-09-07 DIAGNOSIS — N179 Acute kidney failure, unspecified: Secondary | ICD-10-CM | POA: Diagnosis not present

## 2015-09-07 DIAGNOSIS — G9340 Encephalopathy, unspecified: Secondary | ICD-10-CM | POA: Diagnosis not present

## 2015-09-07 DIAGNOSIS — R1110 Vomiting, unspecified: Secondary | ICD-10-CM | POA: Diagnosis not present

## 2015-09-07 DIAGNOSIS — R339 Retention of urine, unspecified: Secondary | ICD-10-CM | POA: Diagnosis present

## 2015-09-07 DIAGNOSIS — E039 Hypothyroidism, unspecified: Secondary | ICD-10-CM | POA: Diagnosis present

## 2015-09-07 DIAGNOSIS — K219 Gastro-esophageal reflux disease without esophagitis: Secondary | ICD-10-CM | POA: Diagnosis not present

## 2015-09-07 DIAGNOSIS — J45909 Unspecified asthma, uncomplicated: Secondary | ICD-10-CM | POA: Diagnosis present

## 2015-09-07 DIAGNOSIS — D696 Thrombocytopenia, unspecified: Secondary | ICD-10-CM | POA: Diagnosis not present

## 2015-09-07 DIAGNOSIS — R402421 Glasgow coma scale score 9-12, in the field [EMT or ambulance]: Secondary | ICD-10-CM | POA: Diagnosis not present

## 2015-09-07 DIAGNOSIS — G40909 Epilepsy, unspecified, not intractable, without status epilepticus: Secondary | ICD-10-CM | POA: Diagnosis present

## 2015-09-07 DIAGNOSIS — N390 Urinary tract infection, site not specified: Secondary | ICD-10-CM | POA: Diagnosis present

## 2015-09-07 DIAGNOSIS — E119 Type 2 diabetes mellitus without complications: Secondary | ICD-10-CM

## 2015-09-07 DIAGNOSIS — N3000 Acute cystitis without hematuria: Secondary | ICD-10-CM

## 2015-09-07 HISTORY — DX: Unspecified convulsions: R56.9

## 2015-09-07 LAB — CBC WITH DIFFERENTIAL/PLATELET
BASOS PCT: 0 %
Basophils Absolute: 0 10*3/uL (ref 0.0–0.1)
EOS ABS: 0 10*3/uL (ref 0.0–0.7)
EOS PCT: 0 %
HCT: 38.9 % (ref 36.0–46.0)
Hemoglobin: 12.6 g/dL (ref 12.0–15.0)
LYMPHS ABS: 3.7 10*3/uL (ref 0.7–4.0)
Lymphocytes Relative: 21 %
MCH: 31.8 pg (ref 26.0–34.0)
MCHC: 32.4 g/dL (ref 30.0–36.0)
MCV: 98.2 fL (ref 78.0–100.0)
MONOS PCT: 2 %
Monocytes Absolute: 0.4 10*3/uL (ref 0.1–1.0)
Neutro Abs: 13.8 10*3/uL — ABNORMAL HIGH (ref 1.7–7.7)
Neutrophils Relative %: 77 %
PLATELETS: 198 10*3/uL (ref 150–400)
RBC: 3.96 MIL/uL (ref 3.87–5.11)
RDW: 15.7 % — ABNORMAL HIGH (ref 11.5–15.5)
WBC: 17.9 10*3/uL — AB (ref 4.0–10.5)

## 2015-09-07 LAB — COMPREHENSIVE METABOLIC PANEL
ALK PHOS: 55 U/L (ref 38–126)
ALT: 15 U/L (ref 14–54)
AST: 24 U/L (ref 15–41)
Albumin: 3.5 g/dL (ref 3.5–5.0)
Anion gap: 10 (ref 5–15)
BUN: 29 mg/dL — AB (ref 6–20)
CALCIUM: 9.3 mg/dL (ref 8.9–10.3)
CHLORIDE: 108 mmol/L (ref 101–111)
CO2: 28 mmol/L (ref 22–32)
CREATININE: 1.08 mg/dL — AB (ref 0.44–1.00)
GFR, EST AFRICAN AMERICAN: 47 mL/min — AB (ref >60)
GFR, EST NON AFRICAN AMERICAN: 41 mL/min — AB (ref >60)
Glucose, Bld: 133 mg/dL — ABNORMAL HIGH (ref 65–99)
Potassium: 3.9 mmol/L (ref 3.5–5.1)
Sodium: 146 mmol/L — ABNORMAL HIGH (ref 135–145)
Total Bilirubin: 0.4 mg/dL (ref 0.3–1.2)
Total Protein: 6.5 g/dL (ref 6.5–8.1)

## 2015-09-07 LAB — GLUCOSE, CAPILLARY
GLUCOSE-CAPILLARY: 69 mg/dL (ref 65–99)
Glucose-Capillary: 133 mg/dL — ABNORMAL HIGH (ref 65–99)

## 2015-09-07 LAB — URINALYSIS, ROUTINE W REFLEX MICROSCOPIC
Bilirubin Urine: NEGATIVE
Glucose, UA: NEGATIVE mg/dL
Ketones, ur: NEGATIVE mg/dL
NITRITE: POSITIVE — AB
Specific Gravity, Urine: 1.01 (ref 1.005–1.030)
pH: 6 (ref 5.0–8.0)

## 2015-09-07 LAB — PROCALCITONIN: Procalcitonin: 2.12 ng/mL

## 2015-09-07 LAB — APTT: aPTT: 27 seconds (ref 24–37)

## 2015-09-07 LAB — TSH: TSH: 4.272 u[IU]/mL (ref 0.350–4.500)

## 2015-09-07 LAB — PROTIME-INR
INR: 1.13 (ref 0.00–1.49)
PROTHROMBIN TIME: 14.7 s (ref 11.6–15.2)

## 2015-09-07 LAB — I-STAT CG4 LACTIC ACID, ED
LACTIC ACID, VENOUS: 3.51 mmol/L — AB (ref 0.5–2.0)
Lactic Acid, Venous: 4.4 mmol/L (ref 0.5–2.0)

## 2015-09-07 LAB — URINE MICROSCOPIC-ADD ON: SQUAMOUS EPITHELIAL / LPF: NONE SEEN

## 2015-09-07 LAB — CK: CK TOTAL: 74 U/L (ref 38–234)

## 2015-09-07 MED ORDER — INSULIN ASPART 100 UNIT/ML ~~LOC~~ SOLN: 0.0000 [IU] | Freq: Every day | SUBCUTANEOUS | Status: DC

## 2015-09-07 MED ORDER — LORAZEPAM 2 MG/ML IJ SOLN: 1.0000 mg | INTRAMUSCULAR | Status: DC | PRN

## 2015-09-07 MED ORDER — SODIUM CHLORIDE 0.9 % IV BOLUS (SEPSIS)
500.0000 mL | Freq: Once | INTRAVENOUS | Status: AC
  Administered 2015-09-07: 500 mL via INTRAVENOUS

## 2015-09-07 MED ORDER — SODIUM CHLORIDE 0.9 % IV SOLN: INTRAVENOUS | Status: AC

## 2015-09-07 MED ORDER — THEOPHYLLINE ER 400 MG PO TB24
400.0000 mg | ORAL_TABLET | Freq: Every day | ORAL | Status: DC
  Administered 2015-09-09: 400 mg via ORAL
  Filled 2015-09-07 (×5): qty 1

## 2015-09-07 MED ORDER — SODIUM CHLORIDE 0.9 % IV SOLN: 500.0000 mg | Freq: Two times a day (BID) | INTRAVENOUS | Status: DC

## 2015-09-07 MED ORDER — ONDANSETRON HCL 4 MG/2ML IJ SOLN
4.0000 mg | Freq: Once | INTRAMUSCULAR | Status: AC
Start: 2015-09-07 — End: 2015-09-07
  Administered 2015-09-07: 4 mg via INTRAVENOUS
  Filled 2015-09-07: qty 2

## 2015-09-07 MED ORDER — ASPIRIN EC 81 MG PO TBEC
81.0000 mg | DELAYED_RELEASE_TABLET | Freq: Every day | ORAL | Status: DC
  Administered 2015-09-09: 81 mg via ORAL
  Filled 2015-09-07 (×2): qty 1

## 2015-09-07 MED ORDER — LORAZEPAM 2 MG/ML IJ SOLN
2.0000 mg | Freq: Once | INTRAMUSCULAR | Status: AC
  Administered 2015-09-07: 2 mg via INTRAVENOUS
  Filled 2015-09-07: qty 1

## 2015-09-07 MED ORDER — SODIUM CHLORIDE 0.9 % IV SOLN: INTRAVENOUS | Status: DC

## 2015-09-07 MED ORDER — LEVETIRACETAM IN NACL 500 MG/100ML IV SOLN
500.0000 mg | Freq: Once | INTRAVENOUS | Status: AC
  Administered 2015-09-07: 500 mg via INTRAVENOUS
  Filled 2015-09-07: qty 100

## 2015-09-07 MED ORDER — INSULIN ASPART 100 UNIT/ML ~~LOC~~ SOLN
0.0000 [IU] | Freq: Three times a day (TID) | SUBCUTANEOUS | Status: DC
  Administered 2015-09-07: 1 [IU] via SUBCUTANEOUS

## 2015-09-07 MED ORDER — TAMSULOSIN HCL 0.4 MG PO CAPS
0.4000 mg | ORAL_CAPSULE | Freq: Every day | ORAL | Status: DC
  Administered 2015-09-09 – 2015-09-12 (×4): 0.4 mg via ORAL
  Filled 2015-09-07 (×5): qty 1

## 2015-09-07 MED ORDER — LEVETIRACETAM IN NACL 500 MG/100ML IV SOLN
500.0000 mg | Freq: Two times a day (BID) | INTRAVENOUS | Status: DC
  Administered 2015-09-08 – 2015-09-12 (×9): 500 mg via INTRAVENOUS
  Filled 2015-09-07 (×11): qty 100

## 2015-09-07 MED ORDER — LEVOTHYROXINE SODIUM 25 MCG PO TABS
25.0000 ug | ORAL_TABLET | Freq: Every day | ORAL | Status: DC
  Administered 2015-09-09 – 2015-09-12 (×4): 25 ug via ORAL
  Filled 2015-09-07 (×4): qty 1

## 2015-09-07 MED ORDER — ONDANSETRON HCL 4 MG/2ML IJ SOLN
4.0000 mg | Freq: Four times a day (QID) | INTRAMUSCULAR | Status: DC | PRN
Start: 2015-09-07 — End: 2015-09-13

## 2015-09-07 MED ORDER — ONDANSETRON HCL 4 MG PO TABS: 4.0000 mg | ORAL_TABLET | Freq: Four times a day (QID) | ORAL | Status: DC | PRN

## 2015-09-07 MED ORDER — MORPHINE SULFATE (PF) 2 MG/ML IV SOLN: 2.0000 mg | INTRAVENOUS | Status: DC | PRN

## 2015-09-07 MED ORDER — MONTELUKAST SODIUM 10 MG PO TABS
10.0000 mg | ORAL_TABLET | Freq: Every morning | ORAL | Status: DC
  Administered 2015-09-09 – 2015-09-12 (×4): 10 mg via ORAL
  Filled 2015-09-07 (×5): qty 1

## 2015-09-07 MED ORDER — SODIUM CHLORIDE 0.9 % IV SOLN: 500.0000 mg | Freq: Once | INTRAVENOUS | Status: DC

## 2015-09-07 MED ORDER — DEXTROSE 5 % IV SOLN
2.0000 g | Freq: Once | INTRAVENOUS | Status: AC
  Administered 2015-09-07: 2 g via INTRAVENOUS
  Filled 2015-09-07: qty 2

## 2015-09-07 MED ORDER — SODIUM CHLORIDE 0.9 % IV SOLN
Freq: Once | INTRAVENOUS | Status: AC
  Administered 2015-09-07: 13:00:00 via INTRAVENOUS

## 2015-09-07 MED ORDER — ACETAMINOPHEN 650 MG RE SUPP
650.0000 mg | Freq: Once | RECTAL | Status: AC
  Administered 2015-09-07: 650 mg via RECTAL
  Filled 2015-09-07: qty 1

## 2015-09-07 MED ORDER — ENOXAPARIN SODIUM 30 MG/0.3ML ~~LOC~~ SOLN
30.0000 mg | SUBCUTANEOUS | Status: DC
  Administered 2015-09-07 – 2015-09-08 (×2): 30 mg via SUBCUTANEOUS
  Filled 2015-09-07 (×2): qty 0.3

## 2015-09-07 MED ORDER — SODIUM CHLORIDE 0.9 % IV SOLN
75.0000 mL/h | INTRAVENOUS | Status: DC
  Administered 2015-09-07 (×2): 75 mL/h via INTRAVENOUS

## 2015-09-07 MED ORDER — DEXTROSE 5 % IV SOLN
1.0000 g | Freq: Once | INTRAVENOUS | Status: AC
  Administered 2015-09-07: 1 g via INTRAVENOUS
  Filled 2015-09-07: qty 10

## 2015-09-07 MED ORDER — LORAZEPAM 2 MG/ML IJ SOLN
INTRAMUSCULAR | Status: AC
  Administered 2015-09-07: 2 mg via INTRAVENOUS
  Filled 2015-09-07: qty 1

## 2015-09-07 MED ORDER — DEXTROSE 5 % IV SOLN
2.0000 g | INTRAVENOUS | Status: DC
  Administered 2015-09-08 – 2015-09-09 (×2): 2 g via INTRAVENOUS
  Filled 2015-09-07 (×3): qty 2

## 2015-09-07 NOTE — ED Notes (Signed)
Pt transported to CT with the primary nurse.

## 2015-09-07 NOTE — H&P (Signed)
History and Physical    [name] [id number] DOB: [birth year] DOA: 09/07/2015  Referring MD/NP/PA: Dr Lacinda Axon - [facility] PCP: [name], MD  Outpatient Specialists: GI - Dr Gala Romney Patient coming from: NH  Chief Complaint: Emesis and combativeness  HPI: [name] is a 82 y.o. female with medical history significant of urinary retention, PUD, COPD, DM, GERD, HTN, pancreatitis, hypothyroidism, MGUS, CVA, severe dementia, protein calorie malnutrition, presenting w/ worsening mental status and emesis.   Level V caveat: Patient presenting with severe dementia and and sepsis causing worsening mental status. Patient unable to provide history. History provided by EDP, nursing home, and family member by phone. Patient present from nursing home due to worsening mental status and emesis 1. Of note patient has severe dementia at baseline. Patient is minimally communicative normally. Patient was started on antibiotics one day ago for UTI. No other reported symptoms.   ED Course: Patient presented an altered mental state. During workup patient had a three-minute witnessed seizure by nursing staff. Patient was given Ativan with resolution of seizure. Patient remained obtunded throughout ED workup. Pertinent labs listed in assessment and plan  Review of Systems: Unable to obtain further review systems due to patient's mental status  Past Medical History  Diagnosis Date  . Urinary retention   . Gastritis   . Esophageal ulcer   . Salmonella sepsis ([place])   . Degenerative disk disease   . Spinal stenosis   . Asthma   . COPD (chronic obstructive pulmonary disease) ([place])   . Diabetes mellitus, type 2 ([place])   . Diverticulosis of colon   . GERD (gastroesophageal reflux disease)   . Hypertension   . Osteoarthritis   . Hiatal hernia   . Pancreatitis   . S/P endoscopy 2009    Dr. Oneida Alar: gastritis  . Diverticulitis   . S/P endoscopy March 2012    Dr. Gala Romney: NSAID induced  ulcer  . Hypothyroidism   . MGUS (monoclonal gammopathy of unknown significance)   . Stroke ([place])   . Dementia   . Renal disorder   . DNR (do not resuscitate)     admission 07/06/15  . C. difficile colitis   . Dementia   . Encephalopathy   . Protein calorie malnutrition ([place])   . Urinary retention   . Cholangitis   . Cholelithiasis with biliary obstruction   . MGUS (monoclonal gammopathy of unknown significance)   . PUD (peptic ulcer disease)   . Seizure [facility])     Past Surgical History  Procedure Laterality Date  . Cervical fusion    . Cholecystectomy    . Ercp N/A 02/02/2014    Procedure: ENDOSCOPIC RETROGRADE CHOLANGIOPANCREATOGRAPHY (ERCP), STONE BASKET EXTRACTION;  Surgeon: Daneil Dolin, MD;  Location: [facility];  Service: Endoscopy;  Laterality: N/A;  . Sphincterotomy N/A 02/02/2014    Procedure: SPHINCTEROTOMY;  Surgeon: Daneil Dolin, MD;  Location: [facility];  Service: Endoscopy;  Laterality: N/A;  . Balloon dilation N/A 02/02/2014    Procedure: BALLOON DILATION;  Surgeon: Daneil Dolin, MD;  Location: [facility];  Service: Endoscopy;  Laterality: N/A;  . Biliary stent placement N/A 02/02/2014    Procedure: BILIARY STENT PLACEMENT;  Surgeon: Daneil Dolin, MD;  Location: [facility];  Service: Endoscopy;  Laterality: N/A;  . Spyglass cholangioscopy N/A 02/02/2014    Procedure: [id number] CHOLANGIOSCOPY;  Surgeon: Daneil Dolin, MD;  Location: [facility];  Service: Endoscopy;  Laterality: N/A;  . Esophagogastroduodenoscopy N/A 02/05/2014  Procedure: ESOPHAGOGASTRODUODENOSCOPY (EGD);  Surgeon: [name], MD;  Location: [facility] SU[facility];  Service: Endoscopy;  Laterality: N/A;     reports that she has never smoked. She has never used smokeless tobacco. She reports that she does not drink alcohol or use illicit drugs.  No Known Allergies  Family History  Problem Relation Age of Onset  . Diabetes Mother     Prior to Admission medications   Medication Sig Start Date End Date Taking?  Authorizing Provider  acetaminophen (TYLENOL) 325 MG tablet Take 2 tablets (650 mg total) by mouth every 6 (six) hours as needed for mild pain (or Fever >/= 101). 04/27/15   Silver Huguenin Elgergawy, MD  amoxicillin-clavulanate (AUGMENTIN) 500-125 MG tablet Take 1 tablet (500 mg total) by mouth 3 (three) times daily. 07/29/15   [name], MD  aspirin 81 MG tablet Take 81 mg by mouth daily.    Historical Provider, MD  Calcium Citrate-Vitamin D (CITRACAL PETITES/VITAMIN D) 200-250 MG-UNIT TABS Take 1 tablet by mouth daily.    Historical Provider, MD  Calcium Citrate-Vitamin D 200-250 MG-UNIT TABS Take 1 tablet by mouth daily. Reported on 07/26/2015    Historical Provider, MD  carbamide peroxide (EARWAX TREATMENT DROPS) 6.5 % otic solution Place 5 drops into both ears 2 (two) times daily as needed (build up).    Historical Provider, MD  levETIRAcetam (KEPPRA) 250 MG tablet Take 1 tablet (250 mg total) by mouth daily. 02/09/14   [name], DO  levothyroxine (SYNTHROID, LEVOTHROID) 25 MCG tablet Take 25 mcg by mouth daily before breakfast.    Historical Provider, MD  montelukast (SINGULAIR) 10 MG tablet Take 10 mg by mouth every morning.     Historical Provider, MD  polyethylene glycol (MIRALAX / GLYCOLAX) packet Take 17 g by mouth daily as needed for mild constipation.    Historical Provider, MD  solifenacin (VESICARE) 5 MG tablet Take 5 mg by mouth daily.    Historical Provider, MD  sulfamethoxazole-trimethoprim (BACTRIM DS) 800-160 MG tablet Take 1 tablet by mouth 2 (two) times daily. 07/29/15   [name], MD  tamsulosin (FLOMAX) 0.4 MG CAPS capsule Take 1 capsule (0.4 mg total) by mouth daily. 02/08/14   [name], DO  theophylline (UNIPHYL) 400 MG 24 hr tablet Take 400 mg by mouth daily.    Historical Provider, MD    Physical Exam: Filed Vitals:   09/07/15 1154 09/07/15 1300 09/07/15 1400 09/07/15 1621  BP:  138/53 135/75 141/68  Pulse: 80 104 104 99  Temp:     99.3 F (37.4 C)  TempSrc:    Oral  Resp:  20 25   Height:    5\' 2"  (1.575 m)  Weight:    47.6 kg (104 lb 15 oz)  SpO2: 97% 99% 100% 95%      Constitutional: NAD, calm, comfortable Filed Vitals:   09/07/15 1154 09/07/15 1300 09/07/15 1400 09/07/15 1621  BP:  138/53 135/75 141/68  Pulse: 80 104 104 99  Temp:    99.3 F (37.4 C)  TempSrc:    Oral  Resp:  20 25   Height:    5\' 2"  (1.575 m)  Weight:    47.6 kg (104 lb 15 oz)  SpO2: 97% 99% 100% 95%   Eyes: lids and conjunctivae normal, patient will open eyes on command. But when opened manually pupils are reactive. ENMT: Mucous membranes are moist.   Neck: normal, supple, no masses, no thyromegaly Respiratory: clear to  auscultation bilaterally, no wheezing, no crackles. Normal respiratory effort. No accessory muscle use.  Cardiovascular: Regular rate and rhythm, difficult to appreciate the patient with 2/6 systolic murmur. 1-2+ lower extremity edema bilaterally.   Abdomen: no tenderness, no masses palpated. No hepatosplenomegaly. Bowel sounds positive.  Musculoskeletal: Arms drawn up the does not appear to be in contracture. Lower extremities weak but not particularly flaccid. Skin: no rashes, lesions, ulcers. No induration Neurologic psychiatric: Unable to fully ascertain condition due to patient's mental status. Patient lying in bed with arms drawn up and eyes to close tightly.   Labs on Admission: I have personally reviewed following labs and imaging studies  CBC:  Recent Labs Lab 09/07/15 1222  WBC 17.9*  NEUTROABS 13.8*  HGB 12.6  HCT 38.9  MCV 98.2  PLT 99991111   Basic Metabolic Panel:  Recent Labs Lab 09/07/15 1321  NA 146*  K 3.9  CL 108  CO2 28  GLUCOSE 133*  BUN 29*  CREATININE 1.08*  CALCIUM 9.3   GFR: Estimated Creatinine Clearance: 20.8 mL/min (by C-G formula based on Cr of 1.08). Liver Function Tests:  Recent Labs Lab 09/07/15 1321  AST 24  ALT 15  ALKPHOS 55  BILITOT 0.4  PROT 6.5    ALBUMIN 3.5   No results for input(s): LIPASE, AMYLASE in the last 168 hours. No results for input(s): AMMONIA in the last 168 hours. Coagulation Profile: No results for input(s): INR, PROTIME in the last 168 hours. Cardiac Enzymes: No results for input(s): CKTOTAL, CKMB, CKMBINDEX, TROPONINI in the last 168 hours. BNP (last 3 results) No results for input(s): PROBNP in the last 8760 hours. HbA1C: No results for input(s): HGBA1C in the last 72 hours. CBG: No results for input(s): GLUCAP in the last 168 hours. Lipid Profile: No results for input(s): CHOL, HDL, LDLCALC, TRIG, CHOLHDL, LDLDIRECT in the last 72 hours. Thyroid Function Tests: No results for input(s): TSH, T4TOTAL, FREET4, T3FREE, THYROIDAB in the last 72 hours. Anemia Panel: No results for input(s): VITAMINB12, FOLATE, FERRITIN, TIBC, IRON, RETICCTPCT in the last 72 hours. Urine analysis:    Component Value Date/Time   COLORURINE YELLOW 09/07/2015 1133   APPEARANCEUR CLEAR 09/07/2015 1133   LABSPEC 1.010 09/07/2015 1133   PHURINE 6.0 09/07/2015 1133   GLUCOSEU NEGATIVE 09/07/2015 1133   HGBUR TRACE* 09/07/2015 1133   BILIRUBINUR NEGATIVE 09/07/2015 1133   KETONESUR NEGATIVE 09/07/2015 1133   PROTEINUR TRACE* 09/07/2015 1133   UROBILINOGEN 0.2 10/27/2014 1618   NITRITE POSITIVE* 09/07/2015 1133   LEUKOCYTESUR TRACE* 09/07/2015 1133   Sepsis Labs: @LABRCNTIP (procalcitonin:4,lacticidven:4) ) Recent Results (from the past 240 hour(s))  Blood Culture (routine x 2)     Status: None (Preliminary result)   Collection Time: 09/07/15 12:38 PM  Result Value Ref Range Status   Specimen Description BLOOD RIGHT HAND  Final   Special Requests BOTTLES DRAWN AEROBIC ONLY 7 CC  Final   Culture PENDING  Incomplete   Report Status PENDING  Incomplete  Blood Culture (routine x 2)     Status: None (Preliminary result)   Collection Time: 09/07/15 12:39 PM  Result Value Ref Range Status   Specimen Description BLOOD LEFT HAND   Final   Special Requests BOTTLES DRAWN AEROBIC ONLY 7 CC  Final   Culture PENDING  Incomplete   Report Status PENDING  Incomplete     Radiological Exams on Admission: Ct Head Wo Contrast  09/07/2015  CLINICAL DATA:  Status post seizure today. Vomiting. No known injury. Subsequent encounter.  EXAM: CT HEAD WITHOUT CONTRAST TECHNIQUE: Contiguous axial images were obtained from the base of the skull through the vertex without intravenous contrast. COMPARISON:  Head CT scan 07/11/2015 and 04/26/2015. FINDINGS: Cortical atrophy and chronic microvascular ischemic change are again seen. No evidence of acute intracranial abnormality including hemorrhage, infarct, mass lesion, mass effect, midline shift or abnormal extra-axial fluid collection. No hydrocephalus or pneumocephalus. The calvarium is intact. Imaged paranasal sinuses and mastoid air cells are clear. The patient is status post bilateral lens extraction. IMPRESSION: No acute abnormality. Atrophy and chronic microvascular ischemic change. Electronically Signed   By: Inge Rise M.D.   On: 09/07/2015 14:48   Dg Chest Port 1 View  09/07/2015  CLINICAL DATA:  One 82 year old female with a history of vomiting EXAM: PORTABLE CHEST 1 VIEW COMPARISON:  07/26/2015, 07/11/2015 FINDINGS: Cardiomediastinal silhouette is unchanged. Atherosclerotic calcification of the aortic arch. No evidence of pulmonary vascular congestion. No pneumothorax. Coarsened interstitial markings bilaterally without confluent airspace disease. No large pleural effusion. No displaced fracture. Surgical changes of the cervical region. IMPRESSION: Chronic lung changes without evidence of acute cardiopulmonary disease. Signed, Dulcy Fanny. Earleen Newport, DO [facility] Electronically Signed   By: Corrie Mckusick D.O.   On: 09/07/2015 12:34      Assessment/Plan Active Problems:   Diabetes ([place])   COPD (chronic obstructive pulmonary  disease) ([place])   Urinary retention   Acute encephalopathy   Dementia   Sepsis ([place])   Urinary tract infectious disease   Sepsis: Likely from UTI. Lactic acid upturning from 3.5-4.4. Serum creatinine 1.08, sodium 146, temperature 101.9, tachycardic, tachypnea, UA grossly abnormal. Head CT negative.  - SDU - Cefepime (on Bactrim x1 day prior to arrival) - UCX, [place] - sepsis protocol initiated - will hydrate gently as pt currently hemodynamically stable (received 2L in ED).  - DNR/DNI (per admission 07/06/15) - h/o urinary retention - cont flomax in am, hold vesicare  Seizure: 12min witnessed seizure in ED. Pt has had szrs in the past - Continue Keppra (IV) - Ativan PRN - Szr precautions - trend Lactic acid - CK  DM: diet controlled. glucose 133 - SSI  COPD/Asthma: stable - continue theorphylline, singulair, in am - albuterol prn  Hypothyroid: - continue synthroid in am - TSH  Social: Patient is a ward of the state. Contacted social services office to discuss with case manager there who is unavailable at the time. Spoke with supervisor who stated that any further formal decisions will have to be made on Monday 1 courts are available for formal paperwork filing. Initially they're unsure of patient's CODE STATUS and had to assume full code however upon further review of patient's chart this matter was addressed at her last admission the patient was made DO NOT RESUSCITATE DO NOT INTUBATE. Discussed condition further with nursing home staff he said that patient's daughter [name] visits her regularly at the nursing home and is involved in her care. I contacted [name] who will come to the hospital to visit. Of note under no circumstances her family allowed to make any decisions for the patient. All formal decisions regarding patient care are to be left to medical staff personnel and Department of Social Services      DVT prophylaxis: Lovenox Code Status: DNR Family Communication:  [place] and Dept of social services.  Disposition Plan: Likely 3-4 days inpatient Consults called: none Admission status: inpatient   MERRELL, DAVID J MD [facility]   If 7PM-7AM,  please contact night-coverage [email or web address] Password [facility]  09/07/2015, 4:46 PM

## 2015-09-07 NOTE — ED Notes (Signed)
X-ray at bedside

## 2015-09-07 NOTE — ED Provider Notes (Addendum)
CSN: [id number]     Arrival date & time 09/07/15  1118 History  By signing my name below, I, [name], attest that this documentation has been prepared under the direction and in the presence of [name] Christen, MD. Electronically Signed: Terressa [name], ED Scribe. 09/07/2015. 12:28 PM   Chief Complaint  Patient presents with  . Emesis   The history is provided by the nursing home and the EMS personnel. No language interpreter was used.   PCP: [name], MD HPI Comments:Level V caveat for dementia [name] is a 82 y.o. female, with PMHx noted below including dementia,DMTII, stroke, HTN, who presents to the Emergency Department complaining of vomiting. Per nursing home, Pt was started on Bactrim yesterday for a suspected UTI, however, no UA was completed before starting Bactrim. The nursing home also reports that pt is combative intermittently and was more combative today.   Past Medical History  Diagnosis Date  . Urinary retention   . Gastritis   . Esophageal ulcer   . Salmonella sepsis ([place])   . Degenerative disk disease   . Spinal stenosis   . Asthma   . COPD (chronic obstructive pulmonary disease) ([place])   . Diabetes mellitus, type 2 ([place])   . Diverticulosis of colon   . GERD (gastroesophageal reflux disease)   . Hypertension   . Osteoarthritis   . Hiatal hernia   . Pancreatitis   . S/P endoscopy 2009    Dr. Oneida Alar: gastritis  . Diverticulitis   . S/P endoscopy March 2012    Dr. Gala Romney: NSAID induced ulcer  . Hypothyroidism   . MGUS (monoclonal gammopathy of unknown significance)   . Stroke ([place])   . Dementia   . Renal disorder   . DNR (do not resuscitate)     admission 07/06/15  . C. difficile colitis   . Dementia   . Encephalopathy   . Protein calorie malnutrition ([name])   . Urinary retention   . Cholangitis   . Cholelithiasis with biliary obstruction   . MGUS (monoclonal gammopathy of unknown significance)   . PUD (peptic ulcer disease)     Past Surgical History  Procedure Laterality Date  . Cervical fusion    . Cholecystectomy    . Ercp N/A 02/02/2014    Procedure: ENDOSCOPIC RETROGRADE CHOLANGIOPANCREATOGRAPHY (ERCP), STONE BASKET EXTRACTION;  Surgeon: Daneil Dolin, MD;  Location: [facility];  Service: Endoscopy;  Laterality: N/A;  . Sphincterotomy N/A 02/02/2014    Procedure: SPHINCTEROTOMY;  Surgeon: Daneil Dolin, MD;  Location: [facility];  Service: Endoscopy;  Laterality: N/A;  . Balloon dilation N/A 02/02/2014    Procedure: BALLOON DILATION;  Surgeon: Daneil Dolin, MD;  Location: [facility];  Service: Endoscopy;  Laterality: N/A;  . Biliary stent placement N/A 02/02/2014    Procedure: BILIARY STENT PLACEMENT;  Surgeon: Daneil Dolin, MD;  Location: [facility];  Service: Endoscopy;  Laterality: N/A;  . Spyglass cholangioscopy N/A 02/02/2014    Procedure: [id number] CHOLANGIOSCOPY;  Surgeon: Daneil Dolin, MD;  Location: [facility];  Service: Endoscopy;  Laterality: N/A;  . Esophagogastroduodenoscopy N/A 02/05/2014    Procedure: ESOPHAGOGASTRODUODENOSCOPY (EGD);  Surgeon: [name], MD;  Location: [facility];  Service: Endoscopy;  Laterality: N/A;   Family History  Problem Relation Age of Onset  . Diabetes Mother    Social History  Substance Use Topics  . Smoking status: Never Smoker   . Smokeless tobacco: Never Used  . Alcohol Use: No  OB History    No data available     Review of Systems  Unable to perform ROS: Dementia (Dementia)     A complete 10 system review of systems was obtained and all systems are negative except as noted in the HPI and PMH.  Allergies  Review of patient's allergies indicates no known allergies.  Home Medications   Prior to Admission medications   Medication Sig Start Date End Date Taking? Authorizing Provider  acetaminophen (TYLENOL) 325 MG tablet Take 2 tablets (650 mg total) by mouth every 6 (six) hours as needed for mild pain (or Fever >/= 101). 04/27/15   Silver Huguenin Elgergawy, MD   amoxicillin-clavulanate (AUGMENTIN) 500-125 MG tablet Take 1 tablet (500 mg total) by mouth 3 (three) times daily. 07/29/15   [name], MD  aspirin 81 MG tablet Take 81 mg by mouth daily.    Historical Provider, MD  Calcium Citrate-Vitamin D (CITRACAL PETITES/VITAMIN D) 200-250 MG-UNIT TABS Take 1 tablet by mouth daily.    Historical Provider, MD  Calcium Citrate-Vitamin D 200-250 MG-UNIT TABS Take 1 tablet by mouth daily. Reported on 07/26/2015    Historical Provider, MD  carbamide peroxide (EARWAX TREATMENT DROPS) 6.5 % otic solution Place 5 drops into both ears 2 (two) times daily as needed (build up).    Historical Provider, MD  levETIRAcetam (KEPPRA) 250 MG tablet Take 1 tablet (250 mg total) by mouth daily. 02/09/14   [name], DO  levothyroxine (SYNTHROID, LEVOTHROID) 25 MCG tablet Take 25 mcg by mouth daily before breakfast.    Historical Provider, MD  montelukast (SINGULAIR) 10 MG tablet Take 10 mg by mouth every morning.     Historical Provider, MD  polyethylene glycol (MIRALAX / GLYCOLAX) packet Take 17 g by mouth daily as needed for mild constipation.    Historical Provider, MD  solifenacin (VESICARE) 5 MG tablet Take 5 mg by mouth daily.    Historical Provider, MD  sulfamethoxazole-trimethoprim (BACTRIM DS) 800-160 MG tablet Take 1 tablet by mouth 2 (two) times daily. 07/29/15   [name], MD  tamsulosin (FLOMAX) 0.4 MG CAPS capsule Take 1 capsule (0.4 mg total) by mouth daily. 02/08/14   [name], DO  theophylline (UNIPHYL) 400 MG 24 hr tablet Take 400 mg by mouth daily.    Historical Provider, MD   Triage Vitals: Pulse 80  Temp(Src) 101.9 F (38.8 C) (Oral)  SpO2 97% Physical Exam  Constitutional:  Elderly appearing but no acute distress  HENT:  Head: Normocephalic and atraumatic.  Eyes: Conjunctivae and EOM are normal. Pupils are equal, round, and reactive to light.  Neck: Normal range of motion. Neck supple.  Cardiovascular: Normal  rate and regular rhythm.   Pulmonary/Chest: Effort normal and breath sounds normal.  Abdominal: Soft. Bowel sounds are normal.  Musculoskeletal:  Unable  Neurological:  Demented  Skin: Skin is warm and dry.  Psychiatric:  Unable   Nursing note and vitals reviewed.   ED Course  Procedures (including critical care time) DIAGNOSTIC STUDIES: Oxygen Saturation is 97% on ra, nl by my interpretation.     Labs Review Labs Reviewed  COMPREHENSIVE METABOLIC PANEL - Abnormal; Notable for the following:    Sodium 146 (*)    Glucose, Bld 133 (*)    BUN 29 (*)    Creatinine, Ser 1.08 (*)    GFR calc non Af Amer 41 (*)    GFR calc Af Amer 47 (*)    All other components within  normal limits  CBC WITH DIFFERENTIAL/PLATELET - Abnormal; Notable for the following:    WBC 17.9 (*)    RDW 15.7 (*)    Neutro Abs 13.8 (*)    All other components within normal limits  URINALYSIS, ROUTINE W REFLEX MICROSCOPIC (NOT AT [facility]) - Abnormal; Notable for the following:    Hgb urine dipstick TRACE (*)    Protein, ur TRACE (*)    Nitrite POSITIVE (*)    Leukocytes, UA TRACE (*)    All other components within normal limits  URINE MICROSCOPIC-ADD ON - Abnormal; Notable for the following:    Bacteria, UA FEW (*)    All other components within normal limits  I-STAT CG4 LACTIC ACID, ED - Abnormal; Notable for the following:    Lactic Acid, Venous 3.51 (*)    All other components within normal limits  CULTURE, BLOOD (ROUTINE X 2)  CULTURE, BLOOD (ROUTINE X 2)  URINE CULTURE  I-STAT CG4 LACTIC ACID, ED    Imaging Review Ct Head Wo Contrast  09/07/2015  CLINICAL DATA:  Status post seizure today. Vomiting. No known injury. Subsequent encounter. EXAM: CT HEAD WITHOUT CONTRAST TECHNIQUE: Contiguous axial images were obtained from the base of the skull through the vertex without intravenous contrast. COMPARISON:  Head CT scan 07/11/2015 and 04/26/2015. FINDINGS: Cortical atrophy and chronic microvascular  ischemic change are again seen. No evidence of acute intracranial abnormality including hemorrhage, infarct, mass lesion, mass effect, midline shift or abnormal extra-axial fluid collection. No hydrocephalus or pneumocephalus. The calvarium is intact. Imaged paranasal sinuses and mastoid air cells are clear. The patient is status post bilateral lens extraction. IMPRESSION: No acute abnormality. Atrophy and chronic microvascular ischemic change. Electronically Signed   By: Inge Rise M.D.   On: 09/07/2015 14:48   Dg Chest Port 1 View  09/07/2015  CLINICAL DATA:  One 82 year old female with a history of vomiting EXAM: PORTABLE CHEST 1 VIEW COMPARISON:  07/26/2015, 07/11/2015 FINDINGS: Cardiomediastinal silhouette is unchanged. Atherosclerotic calcification of the aortic arch. No evidence of pulmonary vascular congestion. No pneumothorax. Coarsened interstitial markings bilaterally without confluent airspace disease. No large pleural effusion. No displaced fracture. Surgical changes of the cervical region. IMPRESSION: Chronic lung changes without evidence of acute cardiopulmonary disease. Signed, Dulcy Fanny. Earleen Newport, DO [facility] Electronically Signed   By: Corrie Mckusick D.O.   On: 09/07/2015 12:34   I have personally reviewed and evaluated these images and lab results as part of my medical decision-making.   EKG Interpretation   Date/Time:  Friday September 07 2015 13:00:30 EDT Ventricular Rate:  106 PR Interval:  186 QRS Duration: 77 QT Interval:  370 QTC Calculation: 491 R Axis:   50 Text Interpretation:  Sinus tachycardia Atrial premature complex Anterior  infarct, old Baseline wander in lead(s) II III aVR aVL aVF V2 Confirmed by  Keionna Kinnaird  MD, Campbell Kray ([id number]) on 09/07/2015 2:23:02 PM     CRITICAL CARE Performed by: [name] Total critical care time: 30 minutes Critical care time was exclusive of separately billable procedures and  treating other patients. Critical care was necessary to treat or prevent imminent or life-threatening deterioration. Critical care was time spent personally by me on the following activities: development of treatment plan with patient and/or surrogate as well as nursing, discussions with consultants, evaluation of patient's response to treatment, examination of patient, obtaining history from patient or surrogate, ordering and performing treatments and interventions, ordering and review of laboratory studies, ordering and  review of radiographic studies, pulse oximetry and re-evaluation of patient's condition. MDM   Final diagnoses:  Sepsis, due to unspecified organism [facility])    Patient is septic. Did not initiate code sepsis secondary to DO NOT RESUSCITATE orders. She was hydrated and given Rocephin 1 g IV for a positive urinalysis. Chest x-ray shows no pneumonia. Patient had a seizure approximately 1400.  Ativan 2 mg IV given along with Keppra 500 mg IV. Admit to stepdown  I personally performed the services described in this documentation, which was scribed in my presence. The recorded information has been reviewed and is accurate.     [name] Christen, MD 09/07/15 [place], MD 09/14/15 [phone]

## 2015-09-07 NOTE — ED Notes (Signed)
Pt has witnessed seizure with 2 nurses. Pt placed on her left side, oxygeen placed on pt. MD at bedside. Pt was given 2 mg Ativan. Pt then suctioned.

## 2015-09-07 NOTE — ED Notes (Signed)
Nurse spoke with [facility]. States pt is combative and confusion at times but not as much as she is today

## 2015-09-07 NOTE — ED Notes (Signed)
Per EMS, pt here from [facility] for evaluation of vomiting. Pt started an antibiotic yesterday for an UTI.

## 2015-09-07 NOTE — Progress Notes (Signed)
Pharmacy Antibiotic Note  [name] is a 82 y.o. female admitted on 09/07/2015 with sepsis.  Pharmacy has been consulted for CEFEPIME dosing.  Plan: Cefepime 2gm IV q24hrs Monitor labs, renal fxn, progress and c/s  Height: 5\' 2"  (157.5 cm) Weight: 104 lb 15 oz (47.6 kg) IBW/kg (Calculated) : 50.1  Temp (24hrs), Avg:100.6 F (38.1 C), Min:99.3 F (37.4 C), Max:101.9 F (38.8 C)   Recent Labs Lab 09/07/15 1222 09/07/15 1257 09/07/15 1321 09/07/15 1503  WBC 17.9*  --   --   --   CREATININE  --   --  1.08*  --   LATICACIDVEN  --  3.51*  --  4.40*    Estimated Creatinine Clearance: 20.8 mL/min (by C-G formula based on Cr of 1.08).    No Known Allergies  Antimicrobials this admission: ROCEPHIN 4/21 X 1 CEFEPIME 4/21 >>   Microbiology results: 4/21 BCx: PENDING 4/21 UCx: PENDING  4/21 MRSA PCR: PENDING  Thank you for allowing pharmacy to be a part of this patient's care.  Hart Robinsons A 09/07/2015 4:54 PM

## 2015-09-07 NOTE — ED Notes (Signed)
Lab at bedside

## 2015-09-08 LAB — COMPREHENSIVE METABOLIC PANEL
ALBUMIN: 3 g/dL — AB (ref 3.5–5.0)
ALT: 12 U/L — AB (ref 14–54)
AST: 19 U/L (ref 15–41)
Alkaline Phosphatase: 48 U/L (ref 38–126)
Anion gap: 8 (ref 5–15)
BUN: 31 mg/dL — AB (ref 6–20)
CHLORIDE: 112 mmol/L — AB (ref 101–111)
CO2: 24 mmol/L (ref 22–32)
CREATININE: 1.38 mg/dL — AB (ref 0.44–1.00)
Calcium: 8.4 mg/dL — ABNORMAL LOW (ref 8.9–10.3)
GFR calc Af Amer: 35 mL/min — ABNORMAL LOW (ref >60)
GFR, EST NON AFRICAN AMERICAN: 30 mL/min — AB (ref >60)
GLUCOSE: 80 mg/dL (ref 65–99)
Potassium: 4.6 mmol/L (ref 3.5–5.1)
Sodium: 144 mmol/L (ref 135–145)
Total Bilirubin: 0.4 mg/dL (ref 0.3–1.2)
Total Protein: 6.1 g/dL — ABNORMAL LOW (ref 6.5–8.1)

## 2015-09-08 LAB — GLUCOSE, CAPILLARY
GLUCOSE-CAPILLARY: 109 mg/dL — AB (ref 65–99)
GLUCOSE-CAPILLARY: 55 mg/dL — AB (ref 65–99)
GLUCOSE-CAPILLARY: 76 mg/dL (ref 65–99)
GLUCOSE-CAPILLARY: 76 mg/dL (ref 65–99)
GLUCOSE-CAPILLARY: 92 mg/dL (ref 65–99)
Glucose-Capillary: 63 mg/dL — ABNORMAL LOW (ref 65–99)
Glucose-Capillary: 73 mg/dL (ref 65–99)

## 2015-09-08 LAB — CBC
HEMATOCRIT: 33.1 % — AB (ref 36.0–46.0)
Hemoglobin: 11.1 g/dL — ABNORMAL LOW (ref 12.0–15.0)
MCH: 32.2 pg (ref 26.0–34.0)
MCHC: 33.5 g/dL (ref 30.0–36.0)
MCV: 95.9 fL (ref 78.0–100.0)
PLATELETS: 160 10*3/uL (ref 150–400)
RBC: 3.45 MIL/uL — AB (ref 3.87–5.11)
RDW: 15.9 % — ABNORMAL HIGH (ref 11.5–15.5)
WBC: 14.2 10*3/uL — AB (ref 4.0–10.5)

## 2015-09-08 LAB — LACTIC ACID, PLASMA: Lactic Acid, Venous: 0.8 mmol/L (ref 0.5–2.0)

## 2015-09-08 LAB — MRSA PCR SCREENING: MRSA by PCR: NEGATIVE

## 2015-09-08 MED ORDER — CETYLPYRIDINIUM CHLORIDE 0.05 % MT LIQD
7.0000 mL | Freq: Two times a day (BID) | OROMUCOSAL | Status: DC
  Administered 2015-09-08 – 2015-09-11 (×8): 7 mL via OROMUCOSAL

## 2015-09-08 MED ORDER — INSULIN ASPART 100 UNIT/ML ~~LOC~~ SOLN
0.0000 [IU] | SUBCUTANEOUS | Status: DC
  Administered 2015-09-10 – 2015-09-11 (×3): 2 [IU] via SUBCUTANEOUS

## 2015-09-08 MED ORDER — DEXTROSE 5 % IV SOLN
INTRAVENOUS | Status: AC
  Filled 2015-09-08: qty 2

## 2015-09-08 MED ORDER — DEXTROSE 50 % IV SOLN
INTRAVENOUS | Status: AC
  Administered 2015-09-08: 50 mL
  Filled 2015-09-08: qty 50

## 2015-09-08 MED ORDER — SODIUM CHLORIDE 0.9 % IV BOLUS (SEPSIS)
1000.0000 mL | Freq: Once | INTRAVENOUS | Status: AC
  Administered 2015-09-08: 1000 mL via INTRAVENOUS

## 2015-09-08 MED ORDER — DEXTROSE 50 % IV SOLN
INTRAVENOUS | Status: AC
  Administered 2015-09-08: 25 mL
  Filled 2015-09-08: qty 50

## 2015-09-08 MED ORDER — CHLORHEXIDINE GLUCONATE 0.12 % MT SOLN
15.0000 mL | Freq: Two times a day (BID) | OROMUCOSAL | Status: DC
  Administered 2015-09-08 – 2015-09-13 (×10): 15 mL via OROMUCOSAL
  Filled 2015-09-08 (×8): qty 15

## 2015-09-08 MED ORDER — DEXTROSE-NACL 5-0.9 % IV SOLN
INTRAVENOUS | Status: DC
  Administered 2015-09-08 – 2015-09-09 (×3): via INTRAVENOUS
  Administered 2015-09-09: 1000 mL via INTRAVENOUS
  Administered 2015-09-10: 12:00:00 via INTRAVENOUS

## 2015-09-08 NOTE — Progress Notes (Signed)
MEDICATION RELATED CONSULT NOTE - INITIAL   Pharmacy Consult for potential interactions with AED medications Pt on Keppra IV  No Known Allergies  Patient Measurements: Height: 5\' 2"  (157.5 cm) Weight: 106 lb 7.7 oz (48.3 kg) IBW/kg (Calculated) : 50.1  Vital Signs: Temp: 100.1 F (37.8 C) (04/22 0740) Temp Source: Axillary (04/22 0740) BP: 97/51 mmHg (04/22 0800) Pulse Rate: 83 (04/22 0500) Intake/Output from previous day: 04/21 0701 - 04/22 0700 In: 950 [I.V.:900; IV Piggyback:50] Out: 388 [Urine:388] Intake/Output from this shift: Total I/O In: 250 [I.V.:150; IV Piggyback:100] Out: -   Labs:  Recent Labs  09/07/15 1222 09/07/15 1321 09/08/15 0656  WBC 17.9*  --  14.2*  HGB 12.6  --  11.1*  HCT 38.9  --  33.1*  PLT 198  --  160  APTT  --  27  --   CREATININE  --  1.08* 1.38*  ALBUMIN  --  3.5 3.0*  PROT  --  6.5 6.1*  AST  --  24 19  ALT  --  15 12*  ALKPHOS  --  55 48  BILITOT  --  0.4 0.4   Estimated Creatinine Clearance: 16.5 mL/min (by C-G formula based on Cr of 1.38).  Medical History: Past Medical History  Diagnosis Date  . Urinary retention   . Gastritis   . Esophageal ulcer   . Salmonella sepsis ([place])   . Degenerative disk disease   . Spinal stenosis   . Asthma   . COPD (chronic obstructive pulmonary disease) ([place])   . Diabetes mellitus, type 2 ([place])   . Diverticulosis of colon   . GERD (gastroesophageal reflux disease)   . Hypertension   . Osteoarthritis   . Hiatal hernia   . Pancreatitis   . S/P endoscopy 2009    Dr. Oneida Alar: gastritis  . Diverticulitis   . S/P endoscopy March 2012    Dr. Gala Romney: NSAID induced ulcer  . Hypothyroidism   . MGUS (monoclonal gammopathy of unknown significance)   . Stroke ([place])   . Dementia   . Renal disorder   . DNR (do not resuscitate)     admission 07/06/15  . C. difficile colitis   . Dementia   . Encephalopathy   . Protein calorie malnutrition ([place])   . Urinary retention   . Cholangitis   .  Cholelithiasis with biliary obstruction   . MGUS (monoclonal gammopathy of unknown significance)   . PUD (peptic ulcer disease)   . Seizure ([place])     Current facility-administered medications:  .  0.9 %  sodium chloride infusion, , Intravenous, STAT, [name], MD .  0.9 %  sodium chloride infusion, 75 mL/hr, Intravenous, Continuous, Waldemar Dickens, MD, Last Rate: 75 mL/hr at 09/08/15 0800, 75 mL/hr at 09/08/15 0800 .  antiseptic oral rinse (CPC / CETYLPYRIDINIUM CHLORIDE 0.05%) solution 7 mL, 7 mL, Mouth Rinse, q12n4p, Waldemar Dickens, MD .  aspirin EC tablet 81 mg, 81 mg, Oral, Daily, Waldemar Dickens, MD, 81 mg at 09/08/15 1000 .  ceFEPIme (MAXIPIME) 2 g in dextrose 5 % 50 mL IVPB, 2 g, Intravenous, Q24H, Waldemar Dickens, MD .  chlorhexidine (PERIDEX) 0.12 % solution 15 mL, 15 mL, Mouth Rinse, BID, Waldemar Dickens, MD, 15 mL at 09/08/15 0042 .  enoxaparin (LOVENOX) injection 30 mg, 30 mg, Subcutaneous, Q24H, Waldemar Dickens, MD, 30 mg at 09/07/15 1746 .  insulin aspart (novoLOG) injection 0-5 Units, 0-5 Units, Subcutaneous, QHS, Waldemar Dickens,  MD, 0 Units at 09/07/15 2240 .  insulin aspart (novoLOG) injection 0-9 Units, 0-9 Units, Subcutaneous, TID WC, Waldemar Dickens, MD, 1 Units at 09/07/15 1751 .  levETIRAcetam (KEPPRA) IVPB 500 mg/100 mL premix, 500 mg, Intravenous, Q12H, Waldemar Dickens, MD, 500 mg at 09/08/15 0747 .  levothyroxine (SYNTHROID, LEVOTHROID) tablet 25 mcg, 25 mcg, Oral, QAC breakfast, Waldemar Dickens, MD, 25 mcg at 09/08/15 0800 .  LORazepam (ATIVAN) injection 1-2 mg, 1-2 mg, Intravenous, Q2H PRN, Waldemar Dickens, MD .  montelukast (SINGULAIR) tablet 10 mg, 10 mg, Oral, q morning - 10a, Waldemar Dickens, MD, 10 mg at 09/08/15 1000 .  morphine 2 MG/ML injection 2 mg, 2 mg, Intravenous, Q2H PRN, Waldemar Dickens, MD .  ondansetron [facility]) tablet 4 mg, 4 mg, Oral, Q6H PRN **OR** ondansetron (ZOFRAN) injection 4 mg, 4 mg, Intravenous, Q6H PRN, Waldemar Dickens, MD .   tamsulosin [facility]) capsule 0.4 mg, 0.4 mg, Oral, Daily, Waldemar Dickens, MD, 0.4 mg at 09/08/15 1000 .  theophylline (UNIPHYL) 400 MG 24 hr tablet 400 mg, 400 mg, Oral, Daily, Waldemar Dickens, MD, 400 mg at 09/08/15 1000  Assessment: 82yo female.  Current medications reviewed (listed above).  No significant drug interactions with Keppra noted.    Plan:  Continue current Rx Monitor labs and progress  Hart Robinsons A 09/08/2015,8:42 AM

## 2015-09-08 NOTE — Progress Notes (Signed)
PROGRESS NOTE    [name]  [id number] DOB: Nov 20, 1912 DOA: 09/07/2015 PCP: [name], MD   Outpatient Specialists:    Brief Narrative:  82 yof with a hx of urinary retention, PUD, COPD, DM type 2, GERD, HTN, hypothyroidism, and dementia presented with worsening mental status and emesis. Patient has severe dementia at baseline and is normally minimally communicative. She has been receiving abx for a UTI. Additionally, while in the ED, a 3 minute Sz was witnessed by the nursing staff. She was given ativan with resolution of the sz, but remained obtunded throughout ED.   Assessment & Plan:   Active Problems:   Diabetes ([place])   COPD (chronic obstructive pulmonary disease) (HCC)   Urinary retention   Acute encephalopathy   Dementia   Sepsis ([place])   Urinary tract infectious disease  1. Sepsis. Likely from UTI.  Lactic acid trending up from 3.5 -4.4, will recheck after administering fluids. Follow up urine and blood Cx. Continue with sepsis protocol. Will hydration as well as broad spectrum abx. 2. Seizure disorder. Pt has a known Sz disorder and take [place] for Sz prevention. She had a 3 minute witnessed Sz in the ED and was treated with ativan. Sz was likely precipitated by fevers. Will continue Keppra for now and monitor for any further sz. Ativan PRN. Sz precautions. Trend lactic acid, 4.40 yesterday. 3. Acute encephalopathy. Super imposed on base line dementia. Likely related to sepsis and UTI. Anticipate improvement with tx of UTI.  4. Acute kidney injury. Possibly related to dehydration and hypotension. Cr 1.38. Continue IV hydration and monitor. Pt has a hx of urinary retention. Continue foley cath and monitor urine output. Continue flomax. 5. DM type 2, diet controlled. Continue SSI 6. COPD/ Asthma. Stable. Give albuterol PRN. 7. GERD. Continue PPI 8. Hypothyroidism. Continue synthroid. TSH wnl. 9. Dementia. Severe at baseline.    DVT prophylaxis:  Lovenox Code Status: DNR Family Communication: No family present at bedside. Pt is ward of state. Disposition Plan: anticipate discharge in 2-3 days to SNF   Consultants:   none  Procedures:   none  Antimicrobials:   Cefepime 4/21 >>   Subjective: Responds to voice but answers are incoherent.   Objective: Filed Vitals:   09/08/15 0430 09/08/15 0500 09/08/15 0530 09/08/15 0600  BP: 107/61  89/53 72/38  Pulse: 80 83    Temp:      TempSrc:      Resp: 17 23 22 22   Height:      Weight:      SpO2: 100% 95%      Intake/Output Summary (Last 24 hours) at 09/08/15 0619 Last data filed at 09/08/15 0600  Gross per 24 hour  Intake    950 ml  Output     13 ml  Net    937 ml   Filed Weights   09/07/15 1621 09/08/15 0400  Weight: 47.6 kg (104 lb 15 oz) 48.3 kg (106 lb 7.7 oz)    Examination:  General exam: Appears calm and comfortable  Respiratory system: Coarse breath sounds at bases. Cardiovascular system: S1 & S2 heard, RRR. No JVD, murmurs, rubs, gallops or clicks. 1+ edema Gastrointestinal system: Abdomen is nondistended, soft and nontender. No organomegaly or masses felt. Normal bowel sounds heard. Central nervous system: Alert and oriented. No focal neurological deficits. Extremities: Symmetric 5 x 5 power. Skin: No rashes, lesions or ulcers Psychiatry: Judgement and insight appear normal. Mood & affect appropriate.    Data  Reviewed: I have personally reviewed following labs and imaging studies  CBC:  Recent Labs Lab 09/07/15 1222  WBC 17.9*  NEUTROABS 13.8*  HGB 12.6  HCT 38.9  MCV 98.2  PLT 99991111   Basic Metabolic Panel:  Recent Labs Lab 09/07/15 1321  NA 146*  K 3.9  CL 108  CO2 28  GLUCOSE 133*  BUN 29*  CREATININE 1.08*  CALCIUM 9.3   GFR: Estimated Creatinine Clearance: 21.1 mL/min (by C-G formula based on Cr of 1.08). Liver Function Tests:  Recent Labs Lab 09/07/15 1321  AST 24  ALT 15  ALKPHOS 55  BILITOT 0.4  PROT 6.5    ALBUMIN 3.5   No results for input(s): LIPASE, AMYLASE in the last 168 hours. No results for input(s): AMMONIA in the last 168 hours. Coagulation Profile:  Recent Labs Lab 09/07/15 1321  INR 1.13   Cardiac Enzymes:  Recent Labs Lab 09/07/15 1321  CKTOTAL 74   BNP (last 3 results) No results for input(s): PROBNP in the last 8760 hours. HbA1C: No results for input(s): HGBA1C in the last 72 hours. CBG:  Recent Labs Lab 09/07/15 1714 09/07/15 2232  GLUCAP 133* 69   Lipid Profile: No results for input(s): CHOL, HDL, LDLCALC, TRIG, CHOLHDL, LDLDIRECT in the last 72 hours. Thyroid Function Tests:  Recent Labs  09/07/15 1321  TSH 4.272   Anemia Panel: No results for input(s): VITAMINB12, FOLATE, FERRITIN, TIBC, IRON, RETICCTPCT in the last 72 hours. Urine analysis:    Component Value Date/Time   COLORURINE YELLOW 09/07/2015 1133   APPEARANCEUR CLEAR 09/07/2015 1133   LABSPEC 1.010 09/07/2015 1133   PHURINE 6.0 09/07/2015 1133   GLUCOSEU NEGATIVE 09/07/2015 1133   HGBUR TRACE* 09/07/2015 1133   BILIRUBINUR NEGATIVE 09/07/2015 1133   KETONESUR NEGATIVE 09/07/2015 1133   PROTEINUR TRACE* 09/07/2015 1133   UROBILINOGEN 0.2 10/27/2014 1618   NITRITE POSITIVE* 09/07/2015 1133   LEUKOCYTESUR TRACE* 09/07/2015 1133   Sepsis Labs: @LABRCNTIP (procalcitonin:4,lacticidven:4)  ) Recent Results (from the past 240 hour(s))  Blood Culture (routine x 2)     Status: None (Preliminary result)   Collection Time: 09/07/15 12:38 PM  Result Value Ref Range Status   Specimen Description BLOOD RIGHT HAND  Final   Special Requests BOTTLES DRAWN AEROBIC ONLY 7 CC  Final   Culture PENDING  Incomplete   Report Status PENDING  Incomplete  Blood Culture (routine x 2)     Status: None (Preliminary result)   Collection Time: 09/07/15 12:39 PM  Result Value Ref Range Status   Specimen Description BLOOD LEFT HAND  Final   Special Requests BOTTLES DRAWN AEROBIC ONLY 7 CC  Final    Culture PENDING  Incomplete   Report Status PENDING  Incomplete  MRSA PCR Screening     Status: None   Collection Time: 09/07/15  4:30 PM  Result Value Ref Range Status   MRSA by PCR NEGATIVE NEGATIVE Final    Comment:        The GeneXpert MRSA Assay (FDA approved for NASAL specimens only), is one component of a comprehensive MRSA colonization surveillance program. It is not intended to diagnose MRSA infection nor to guide or monitor treatment for MRSA infections.          Radiology Studies: Ct Head Wo Contrast  09/07/2015  CLINICAL DATA:  Status post seizure today. Vomiting. No known injury. Subsequent encounter. EXAM: CT HEAD WITHOUT CONTRAST TECHNIQUE: Contiguous axial images were obtained from the base of the skull through the  vertex without intravenous contrast. COMPARISON:  Head CT scan 07/11/2015 and 04/26/2015. FINDINGS: Cortical atrophy and chronic microvascular ischemic change are again seen. No evidence of acute intracranial abnormality including hemorrhage, infarct, mass lesion, mass effect, midline shift or abnormal extra-axial fluid collection. No hydrocephalus or pneumocephalus. The calvarium is intact. Imaged paranasal sinuses and mastoid air cells are clear. The patient is status post bilateral lens extraction. IMPRESSION: No acute abnormality. Atrophy and chronic microvascular ischemic change. Electronically Signed   By: Inge Rise M.D.   On: 09/07/2015 14:48   Dg Chest Port 1 View  09/07/2015  CLINICAL DATA:  One 82 year old female with a history of vomiting EXAM: PORTABLE CHEST 1 VIEW COMPARISON:  07/26/2015, 07/11/2015 FINDINGS: Cardiomediastinal silhouette is unchanged. Atherosclerotic calcification of the aortic arch. No evidence of pulmonary vascular congestion. No pneumothorax. Coarsened interstitial markings bilaterally without confluent airspace disease. No large pleural effusion. No displaced fracture. Surgical changes of the cervical region.  IMPRESSION: Chronic lung changes without evidence of acute cardiopulmonary disease. Signed, Dulcy Fanny. Earleen Newport, DO [facility] Radiology Electronically Signed   By: Corrie Mckusick D.O.   On: 09/07/2015 12:34        Scheduled Meds: . sodium chloride   Intravenous STAT  . antiseptic oral rinse  7 mL Mouth Rinse q12n4p  . aspirin EC  81 mg Oral Daily  . ceFEPime (MAXIPIME) IV  2 g Intravenous Q24H  . chlorhexidine  15 mL Mouth Rinse BID  . enoxaparin (LOVENOX) injection  30 mg Subcutaneous Q24H  . insulin aspart  0-5 Units Subcutaneous QHS  . insulin aspart  0-9 Units Subcutaneous TID WC  . levETIRAcetam  500 mg Intravenous Q12H  . levothyroxine  25 mcg Oral QAC breakfast  . montelukast  10 mg Oral q morning - 10a  . tamsulosin  0.4 mg Oral Daily  . theophylline  400 mg Oral Daily   Continuous Infusions: . sodium chloride 75 mL/hr (09/08/15 0600)     LOS: 1 day    Time spent: 25 minutes    Kathie Dike, MD [facility] Pager [phone]  If 7PM-7AM, please contact night-coverage [email or web address] Password [email or web address] 09/08/2015, 6:19 AM   By signing my name below, I, Delene Ruffini, attest that this documentation has been prepared under the direction and in the presence of Kathie Dike, MD. Electronically Signed: Delene Ruffini 09/08/2015 10:25am  I, Dr. Kathie Dike, personally performed the services described in this documentaiton. All medical record entries made by the scribe were at my direction and in my presence. I have reviewed the chart and agree that the record reflects my personal performance and is accurate and complete  Kathie Dike, MD, 09/08/2015 10:47 AM

## 2015-09-08 NOTE — Progress Notes (Signed)
Pharmacy Antibiotic Note  KIMERA PAPAGEORGIOU is a 82 y.o. female admitted on 09/07/2015 with sepsis.  Pharmacy has been consulted for CEFEPIME dosing.  SCr slightly elevated.    Plan: Cefepime 2gm IV q24hrs Monitor labs, renal fxn, progress and c/s  Height: 5\' 2"  (157.5 cm) Weight: 106 lb 7.7 oz (48.3 kg) IBW/kg (Calculated) : 50.1  Temp (24hrs), Avg:100 F (37.8 C), Min:99.2 F (37.3 C), Max:101.9 F (38.8 C)   Recent Labs Lab 09/07/15 1222 09/07/15 1257 09/07/15 1321 09/07/15 1503 09/08/15 0656  WBC 17.9*  --   --   --  14.2*  CREATININE  --   --  1.08*  --  1.38*  LATICACIDVEN  --  3.51*  --  4.40*  --     Estimated Creatinine Clearance: 16.5 mL/min (by C-G formula based on Cr of 1.38).    No Known Allergies  Antimicrobials this admission: ROCEPHIN 4/21 X 1 CEFEPIME 4/21 >>   Microbiology results: 4/21 BCx: PENDING 4/21 UCx: PENDING  4/21 MRSA PCR: PENDING  Thank you for allowing pharmacy to be a part of this patient's care.  Hart Robinsons A 09/08/2015 8:38 AM

## 2015-09-08 NOTE — Progress Notes (Signed)
PT IS MORE ALERT. ATTEMPTING TO SPEAK BUT SPEECH IS GARBLED.

## 2015-09-09 DIAGNOSIS — D696 Thrombocytopenia, unspecified: Secondary | ICD-10-CM | POA: Insufficient documentation

## 2015-09-09 LAB — BASIC METABOLIC PANEL
Anion gap: 8 (ref 5–15)
BUN: 22 mg/dL — ABNORMAL HIGH (ref 6–20)
CALCIUM: 8.2 mg/dL — AB (ref 8.9–10.3)
CO2: 21 mmol/L — AB (ref 22–32)
Chloride: 115 mmol/L — ABNORMAL HIGH (ref 101–111)
Creatinine, Ser: 1.14 mg/dL — ABNORMAL HIGH (ref 0.44–1.00)
GFR, EST AFRICAN AMERICAN: 44 mL/min — AB (ref >60)
GFR, EST NON AFRICAN AMERICAN: 38 mL/min — AB (ref >60)
Glucose, Bld: 89 mg/dL (ref 65–99)
Potassium: 4.2 mmol/L (ref 3.5–5.1)
Sodium: 144 mmol/L (ref 135–145)

## 2015-09-09 LAB — GLUCOSE, CAPILLARY
GLUCOSE-CAPILLARY: 70 mg/dL (ref 65–99)
Glucose-Capillary: 76 mg/dL (ref 65–99)
Glucose-Capillary: 74 mg/dL (ref 65–99)
Glucose-Capillary: 119 mg/dL — ABNORMAL HIGH (ref 65–99)

## 2015-09-09 LAB — CBC
HEMATOCRIT: 32.9 % — AB (ref 36.0–46.0)
HEMATOCRIT: 34.3 % — AB (ref 36.0–46.0)
Hemoglobin: 10.8 g/dL — ABNORMAL LOW (ref 12.0–15.0)
Hemoglobin: 11.5 g/dL — ABNORMAL LOW (ref 12.0–15.0)
MCH: 32.1 pg (ref 26.0–34.0)
MCH: 32.3 pg (ref 26.0–34.0)
MCHC: 32.8 g/dL (ref 30.0–36.0)
MCHC: 33.5 g/dL (ref 30.0–36.0)
MCV: 97.9 fL (ref 78.0–100.0)
MCV: 96.3 fL (ref 78.0–100.0)
PLATELETS: 33 10*3/uL — AB (ref 150–400)
Platelets: 160 10*3/uL (ref 150–400)
RBC: 3.36 MIL/uL — ABNORMAL LOW (ref 3.87–5.11)
RBC: 3.56 MIL/uL — ABNORMAL LOW (ref 3.87–5.11)
RDW: 16.1 % — AB (ref 11.5–15.5)
RDW: 15.9 % — AB (ref 11.5–15.5)
WBC: 8.8 10*3/uL (ref 4.0–10.5)
WBC: 10.3 10*3/uL (ref 4.0–10.5)

## 2015-09-09 MED ORDER — ENOXAPARIN SODIUM 30 MG/0.3ML ~~LOC~~ SOLN
30.0000 mg | SUBCUTANEOUS | Status: DC
  Administered 2015-09-09 – 2015-09-11 (×3): 30 mg via SUBCUTANEOUS
  Filled 2015-09-09 (×3): qty 0.3

## 2015-09-09 MED ORDER — ENSURE ENLIVE PO LIQD
237.0000 mL | Freq: Two times a day (BID) | ORAL | Status: DC
  Administered 2015-09-09 – 2015-09-13 (×8): 237 mL via ORAL

## 2015-09-09 NOTE — Progress Notes (Signed)
PROGRESS NOTE    [name]  [id number] DOB: 1912-06-21 DOA: 09/07/2015 PCP: [name], MD   Outpatient Specialists:    Brief Narrative:  82 yof with a hx of urinary retention, PUD, COPD, DM type 2, GERD, HTN, hypothyroidism, and dementia presented with worsening mental status and emesis. Patient has severe dementia at baseline and is normally minimally communicative. She has been receiving abx for a UTI. Additionally, while in the ED, a 3 minute Sz was witnessed by the nursing staff. She was given ativan with resolution of the sz, but remained obtunded throughout ED.   Assessment & Plan:   Active Problems:   Diabetes ([place])   COPD (chronic obstructive pulmonary disease) (HCC)   Urinary retention   Acute encephalopathy   Dementia   Sepsis ([place])   Urinary tract infectious disease  1. Sepsis. Likely from UTI.  Lactic acid wnl s/p after IVF. UC and BC in prcoes. Continue  hydration as well as broad spectrum abx per sepsis protocol.  2. Seizure disorder. Patient witnessed to have a 3 minute seizure in the ED and was treated with ativan. Seizure was likely precipitated by fevers. Continue Keppra for now and monitor for any further sz. Ativan PRN. Sz precautions. Lactic acid now wnl.  3. Acute encephalopathy.Improving,Superimposed on base line dementia. Likely related to sepsis and UTI. Anticipate improvement with tx of UTI.  4. Acute kidney injury. Improving. Possibly related to dehydration and hypotension. Continue IV hydration and monitor. Pt has a hx of urinary retention. Continue foley cath and monitor urine output. Continue flomax. 5. Thrombocytopenia,unclear etiology. Will check stat CBC to confirm this is not a lab abnormality. Will hold lovenox for now. If this is a true thrombocytopenia, will need to check DIC panel, PF4 antibodies and serotonin release assay. May also be reactive to underlying infectious process.  6. DM type 2, diet controlled. Continue SSI.  Having episodes of hypoglycemia and required dextrose infusion. She has been NPO to this point due to her mental status. Since her mental status appears to be approaching baseline, will try to advance her diet. Continue to follow CBGs for now.  7. COPD/ Asthma. Stable. Give albuterol PRN. 8. GERD. Continue PPI 9. Hypothyroidism. Continue synthroid. TSH wnl 10. Dementia. Severe at baseline.    DVT prophylaxis: Lovenox Code Status: DNR Family Communication: No family present at bedside. Pt is ward of state. Disposition Plan: anticipate discharge in 2-3 days to SNF   Consultants:   none  Procedures:   none  Antimicrobials:   Cefepime 4/21 >>   Subjective:   Objective: Filed Vitals:   09/09/15 0300 09/09/15 0400 09/09/15 0500 09/09/15 0600  BP: 126/65 142/100 171/158 143/76  Pulse: 87 105 97 82  Temp:  98.1 F (36.7 C)    TempSrc:  Axillary    Resp:      Height:      Weight:   50.1 kg (110 lb 7.2 oz)   SpO2: 96% 95% 95% 95%    Intake/Output Summary (Last 24 hours) at 09/09/15 0733 Last data filed at 09/09/15 0600  Gross per 24 hour  Intake   2050 ml  Output   1025 ml  Net   1025 ml   Filed Weights   09/07/15 1621 09/08/15 0400 09/09/15 0500  Weight: 47.6 kg (104 lb 15 oz) 48.3 kg (106 lb 7.7 oz) 50.1 kg (110 lb 7.2 oz)    Examination:  General exam: NAD  Respiratory system: Rhonchi at bases Cardiovascular  system:  RRR, with occasional PVCs. No JVD, murmurs, rubs, gallops or clicks. 1+ edema. Gastrointestinal system: Abdomen is nondistended, soft and nontender. No organomegaly or masses felt. Normal bowel sounds heard. Central nervous system: Alert and oriented. No focal neurological deficits. Extremities: Symmetric 5 x 5 power. Skin: No rashes, lesions or ulcers Psychiatry: Judgement and insight appear normal. Mood & affect appropriate.    Data Reviewed: I have personally reviewed following labs and imaging studies  CBC:  Recent Labs Lab  09/07/15 1222 09/08/15 0656 09/09/15 0455  WBC 17.9* 14.2* 8.8  NEUTROABS 13.8*  --   --   HGB 12.6 11.1* 10.8*  HCT 38.9 33.1* 32.9*  MCV 98.2 95.9 97.9  PLT 198 160 33*   Basic Metabolic Panel:  Recent Labs Lab 09/07/15 1321 09/08/15 0656 09/09/15 0455  NA 146* 144 144  K 3.9 4.6 4.2  CL 108 112* 115*  CO2 28 24 21*  GLUCOSE 133* 80 89  BUN 29* 31* 22*  CREATININE 1.08* 1.38* 1.14*  CALCIUM 9.3 8.4* 8.2*   GFR: Estimated Creatinine Clearance: 20.8 mL/min (by C-G formula based on Cr of 1.14). Liver Function Tests:  Recent Labs Lab 09/07/15 1321 09/08/15 0656  AST 24 19  ALT 15 12*  ALKPHOS 55 48  BILITOT 0.4 0.4  PROT 6.5 6.1*  ALBUMIN 3.5 3.0*   Coagulation Profile:  Recent Labs Lab 09/07/15 1321  INR 1.13   Cardiac Enzymes:  Recent Labs Lab 09/07/15 1321  CKTOTAL 74    Recent Labs Lab 09/08/15 1255 09/08/15 1602 09/08/15 1942 09/08/15 2358 09/09/15 0403  GLUCAP 109* 73 76 76 70   Thyroid Function Tests:  Recent Labs  09/07/15 1321  TSH 4.272   Urine analysis:    Component Value Date/Time   COLORURINE YELLOW 09/07/2015 1133   APPEARANCEUR CLEAR 09/07/2015 1133   LABSPEC 1.010 09/07/2015 1133   PHURINE 6.0 09/07/2015 1133   GLUCOSEU NEGATIVE 09/07/2015 1133   HGBUR TRACE* 09/07/2015 1133   BILIRUBINUR NEGATIVE 09/07/2015 1133   KETONESUR NEGATIVE 09/07/2015 1133   PROTEINUR TRACE* 09/07/2015 1133   UROBILINOGEN 0.2 10/27/2014 1618   NITRITE POSITIVE* 09/07/2015 1133   LEUKOCYTESUR TRACE* 09/07/2015 1133   Sepsis Labs: @LABRCNTIP (procalcitonin:4,lacticidven:4)  ) Recent Results (from the past 240 hour(s))  Blood Culture (routine x 2)     Status: None (Preliminary result)   Collection Time: 09/07/15 12:38 PM  Result Value Ref Range Status   Specimen Description BLOOD RIGHT HAND  Final   Special Requests BOTTLES DRAWN AEROBIC ONLY 7 CC  Final   Culture PENDING  Incomplete   Report Status PENDING  Incomplete  Blood  Culture (routine x 2)     Status: None (Preliminary result)   Collection Time: 09/07/15 12:39 PM  Result Value Ref Range Status   Specimen Description BLOOD LEFT HAND  Final   Special Requests BOTTLES DRAWN AEROBIC ONLY 7 CC  Final   Culture PENDING  Incomplete   Report Status PENDING  Incomplete  MRSA PCR Screening     Status: None   Collection Time: 09/07/15  4:30 PM  Result Value Ref Range Status   MRSA by PCR NEGATIVE NEGATIVE Final    Comment:        The GeneXpert MRSA Assay (FDA approved for NASAL specimens only), is one component of a comprehensive MRSA colonization surveillance program. It is not intended to diagnose MRSA infection nor to guide or monitor treatment for MRSA infections.  Radiology Studies: Ct Head Wo Contrast  09/07/2015  CLINICAL DATA:  Status post seizure today. Vomiting. No known injury. Subsequent encounter. EXAM: CT HEAD WITHOUT CONTRAST TECHNIQUE: Contiguous axial images were obtained from the base of the skull through the vertex without intravenous contrast. COMPARISON:  Head CT scan 07/11/2015 and 04/26/2015. FINDINGS: Cortical atrophy and chronic microvascular ischemic change are again seen. No evidence of acute intracranial abnormality including hemorrhage, infarct, mass lesion, mass effect, midline shift or abnormal extra-axial fluid collection. No hydrocephalus or pneumocephalus. The calvarium is intact. Imaged paranasal sinuses and mastoid air cells are clear. The patient is status post bilateral lens extraction. IMPRESSION: No acute abnormality. Atrophy and chronic microvascular ischemic change. Electronically Signed   By: Inge Rise M.D.   On: 09/07/2015 14:48   Dg Chest Port 1 View  09/07/2015  CLINICAL DATA:  One 82 year old female with a history of vomiting EXAM: PORTABLE CHEST 1 VIEW COMPARISON:  07/26/2015, 07/11/2015 FINDINGS: Cardiomediastinal silhouette is unchanged. Atherosclerotic calcification of the aortic arch. No  evidence of pulmonary vascular congestion. No pneumothorax. Coarsened interstitial markings bilaterally without confluent airspace disease. No large pleural effusion. No displaced fracture. Surgical changes of the cervical region. IMPRESSION: Chronic lung changes without evidence of acute cardiopulmonary disease. Signed, Dulcy Fanny. Earleen Newport, DO [facility] Radiology Electronically Signed   By: Corrie Mckusick D.O.   On: 09/07/2015 12:34        Scheduled Meds: . antiseptic oral rinse  7 mL Mouth Rinse q12n4p  . aspirin EC  81 mg Oral Daily  . ceFEPime (MAXIPIME) IV  2 g Intravenous Q24H  . chlorhexidine  15 mL Mouth Rinse BID  . enoxaparin (LOVENOX) injection  30 mg Subcutaneous Q24H  . insulin aspart  0-9 Units Subcutaneous Q4H  . levETIRAcetam  500 mg Intravenous Q12H  . levothyroxine  25 mcg Oral QAC breakfast  . montelukast  10 mg Oral q morning - 10a  . tamsulosin  0.4 mg Oral Daily  . theophylline  400 mg Oral Daily   Continuous Infusions: . dextrose 5 % and 0.9% NaCl 75 mL/hr at 09/09/15 0600     LOS: 2 days    Time spent: 25 minutes    Kathie Dike, MD [facility] Pager [phone]  If 7PM-7AM, please contact night-coverage [email or web address] Password [email or web address] 09/09/2015, 7:33 AM    By signing my name below, I, Rennis Harding, attest that this documentation has been prepared under the direction and in the presence of Kathie Dike, MD. Electronically signed: Rennis Harding, Scribe. 09/09/2015 8:25am.  I, Dr. Kathie Dike, personally performed the services described in this documentaiton. All medical record entries made by the scribe were at my direction and in my presence. I have reviewed the chart and agree that the record reflects my personal performance and is accurate and complete  Kathie Dike, MD, 09/09/2015 8:45 AM

## 2015-09-10 LAB — GLUCOSE, CAPILLARY
GLUCOSE-CAPILLARY: 99 mg/dL (ref 65–99)
GLUCOSE-CAPILLARY: 101 mg/dL — AB (ref 65–99)
Glucose-Capillary: 111 mg/dL — ABNORMAL HIGH (ref 65–99)
Glucose-Capillary: 180 mg/dL — ABNORMAL HIGH (ref 65–99)
Glucose-Capillary: 103 mg/dL — ABNORMAL HIGH (ref 65–99)

## 2015-09-10 LAB — URINE CULTURE: Culture: 6000 — AB

## 2015-09-10 MED ORDER — THEOPHYLLINE 80 MG/15ML PO ELIX
130.0000 mg | ORAL_SOLUTION | Freq: Three times a day (TID) | ORAL | Status: DC
  Administered 2015-09-10 – 2015-09-11 (×4): 130 mg via ORAL
  Filled 2015-09-10 (×13): qty 24.4

## 2015-09-10 MED ORDER — ASPIRIN 81 MG PO CHEW
81.0000 mg | CHEWABLE_TABLET | Freq: Every day | ORAL | Status: DC
  Administered 2015-09-10 – 2015-09-12 (×3): 81 mg via ORAL
  Filled 2015-09-10 (×4): qty 1

## 2015-09-10 NOTE — Progress Notes (Addendum)
PROGRESS NOTE    [name]  [id number] DOB: 1912/08/07 DOA: 09/07/2015 PCP: [name], MD   Outpatient Specialists: Gastro: Dr. Oneida Alar.  Oncology: Dr Whitney Muse   Brief Narrative:  25 yof with a hx of urinary retention, PUD, COPD, DM type 2, GERD, HTN, hypothyroidism, and dementia presented with worsening mental status and emesis. Patient has severe dementia at baseline and is normally minimally communicative. She has been receiving abx for a UTI. Additionally, while in the ED, a 3 minute Sz was witnessed by the nursing staff. She was given ativan with resolution of the sz, but remained obtunded throughout ED.  Assessment & Plan:   Active Problems:   Diabetes ([place])   COPD (chronic obstructive pulmonary disease) (HCC)   Urinary retention   Acute encephalopathy   Dementia   Sepsis ([place])   Urinary tract infectious disease   Thrombocytopenia ([place])  1. Sepsis. Likely from UTI.  Lactic acid wnl s/p after IVF. Blood cultures have shown no growth and urine culture showed non specific growth. She has been adequately hydrated. Will hold further abx.  2. Seizure disorder. Patient witnessed to have a 3 minute seizure in the ED and was treated with ativan. Seizure was likely precipitated by fevers. Continue Keppra for now and monitor for any further sz. Ativan PRN. Sz precautions. Lactic acid now wnl.  3. Acute encephalopathy. Superimposed on base line dementia. Improving. Likely related to sepsis and UTI. Anticipate improvement with tx of UTI.  4. Acute kidney injury. Improving. Possibly related to dehydration and hypotension. Continue IV hydration and monitor. Pt has a hx of urinary retention. Continue foley cath and monitor urine output. Continue flomax. 5. DM type 2, diet controlled. Continue SSI. Having episodes of hypoglycemia and required dextrose infusion. She has been started on a diet, but often refuses to eat. Continue to follow CBGs for now. Will discontinue  dextrose today to see if she can maintain her blood sugar. 6. COPD/ Asthma. Stable. Give albuterol PRN. 7. GERD. Continue PPI 8. Hypothyroidism. Continue synthroid. TSH wnl 9. Dementia. Severe at baseline. PO intake has been poor. Palliative consulted to address goals of care.   DVT prophylaxis: Lovenox Code Status: full code. Dept of Social Services has agreed with DNR status, but needs paperwork to be filled out by 2 physicians. Will do this today. Family Communication: discussed with family at the bedside. Pt is ward of state. Disposition Plan: Anticipate discharge in 1-2 days to SNF   Consultants:   Palliative care  Procedures:   none  Antimicrobials:   Cefepime 4/21 >>4/24   Subjective: Patient does not answer questions.   Objective: Filed Vitals:   09/09/15 1500 09/09/15 1600 09/09/15 1947 09/09/15 2100  BP:  151/100  161/63  Pulse: 77 78  72  Temp:    99.3 F (37.4 C)  TempSrc:    Axillary  Resp:    18  Height:      Weight:      SpO2: 99% 99% 94% 93%    Intake/Output Summary (Last 24 hours) at 09/10/15 0805 Last data filed at 09/10/15 0500  Gross per 24 hour  Intake   1725 ml  Output   1425 ml  Net    300 ml   Filed Weights   09/07/15 1621 09/08/15 0400 09/09/15 0500  Weight: 47.6 kg (104 lb 15 oz) 48.3 kg (106 lb 7.7 oz) 50.1 kg (110 lb 7.2 oz)    Examination:  General exam: NAD  Respiratory system:  Rhonchi at bases Cardiovascular system:  RRR, with occasional PVCs. No JVD, murmurs, rubs, gallops or clicks. 1+ edema. Gastrointestinal system: Abdomen is nondistended, soft and nontender. No organomegaly or masses felt. Normal bowel sounds heard. Central nervous system: Alert. No focal neurological deficits. Skin: No rashes, lesions or ulcers Psychiatry: nonverbal    Data Reviewed: I have personally reviewed following labs and imaging studies  CBC:  Recent Labs Lab 09/07/15 1222 09/08/15 0656 09/09/15 0455 09/09/15 0834  WBC 17.9* 14.2*  8.8 10.3  NEUTROABS 13.8*  --   --   --   HGB 12.6 11.1* 10.8* 11.5*  HCT 38.9 33.1* 32.9* 34.3*  MCV 98.2 95.9 97.9 96.3  PLT 198 160 33* 0000000   Basic Metabolic Panel:  Recent Labs Lab 09/07/15 1321 09/08/15 0656 09/09/15 0455  NA 146* 144 144  K 3.9 4.6 4.2  CL 108 112* 115*  CO2 28 24 21*  GLUCOSE 133* 80 89  BUN 29* 31* 22*  CREATININE 1.08* 1.38* 1.14*  CALCIUM 9.3 8.4* 8.2*   GFR: Estimated Creatinine Clearance: 20.8 mL/min (by C-G formula based on Cr of 1.14). Liver Function Tests:  Recent Labs Lab 09/07/15 1321 09/08/15 0656  AST 24 19  ALT 15 12*  ALKPHOS 55 48  BILITOT 0.4 0.4  PROT 6.5 6.1*  ALBUMIN 3.5 3.0*   Coagulation Profile:  Recent Labs Lab 09/07/15 1321  INR 1.13   Cardiac Enzymes:  Recent Labs Lab 09/07/15 1321  CKTOTAL 74    Recent Labs Lab 09/09/15 0732 09/09/15 1110 09/09/15 1555 09/10/15 0425 09/10/15 0712  GLUCAP 76 74 119* 111* 99   Thyroid Function Tests:  Recent Labs  09/07/15 1321  TSH 4.272   Urine analysis:    Component Value Date/Time   COLORURINE YELLOW 09/07/2015 1133   APPEARANCEUR CLEAR 09/07/2015 1133   LABSPEC 1.010 09/07/2015 1133   PHURINE 6.0 09/07/2015 1133   GLUCOSEU NEGATIVE 09/07/2015 1133   HGBUR TRACE* 09/07/2015 1133   BILIRUBINUR NEGATIVE 09/07/2015 1133   KETONESUR NEGATIVE 09/07/2015 1133   PROTEINUR TRACE* 09/07/2015 1133   UROBILINOGEN 0.2 10/27/2014 1618   NITRITE POSITIVE* 09/07/2015 1133   LEUKOCYTESUR TRACE* 09/07/2015 1133   Sepsis Labs: @LABRCNTIP (procalcitonin:4,lacticidven:4)  ) Recent Results (from the past 240 hour(s))  Blood Culture (routine x 2)     Status: None (Preliminary result)   Collection Time: 09/07/15 12:38 PM  Result Value Ref Range Status   Specimen Description BLOOD RIGHT HAND  Final   Special Requests BOTTLES DRAWN AEROBIC ONLY 7 CC  Final   Culture PENDING  Incomplete   Report Status PENDING  Incomplete  Blood Culture (routine x 2)     Status:  None (Preliminary result)   Collection Time: 09/07/15 12:39 PM  Result Value Ref Range Status   Specimen Description BLOOD LEFT HAND  Final   Special Requests BOTTLES DRAWN AEROBIC ONLY 7 CC  Final   Culture PENDING  Incomplete   Report Status PENDING  Incomplete  MRSA PCR Screening     Status: None   Collection Time: 09/07/15  4:30 PM  Result Value Ref Range Status   MRSA by PCR NEGATIVE NEGATIVE Final    Comment:        The GeneXpert MRSA Assay (FDA approved for NASAL specimens only), is one component of a comprehensive MRSA colonization surveillance program. It is not intended to diagnose MRSA infection nor to guide or monitor treatment for MRSA infections.  Radiology Studies: No results found.      Scheduled Meds: . antiseptic oral rinse  7 mL Mouth Rinse q12n4p  . aspirin EC  81 mg Oral Daily  . ceFEPime (MAXIPIME) IV  2 g Intravenous Q24H  . chlorhexidine  15 mL Mouth Rinse BID  . enoxaparin (LOVENOX) injection  30 mg Subcutaneous Q24H  . feeding supplement (ENSURE ENLIVE)  237 mL Oral BID BM  . insulin aspart  0-9 Units Subcutaneous Q4H  . levETIRAcetam  500 mg Intravenous Q12H  . levothyroxine  25 mcg Oral QAC breakfast  . montelukast  10 mg Oral q morning - 10a  . tamsulosin  0.4 mg Oral Daily  . theophylline  400 mg Oral Daily   Continuous Infusions: . dextrose 5 % and 0.9% NaCl 75 mL/hr at 09/09/15 2249     LOS: 3 days    Time spent: 25 minutes    Kathie Dike, MD [facility] Pager [phone]  If 7PM-7AM, please contact night-coverage [email or web address] Password [email or web address] 09/10/2015, 8:05 AM

## 2015-09-10 NOTE — Care Management Note (Signed)
Case Management Note  Patient Details  Name: [name] MRN: [id number] Date of Birth: [birth year]  Subjective/Objective:                  Pt admitted with sepsis. Pt is from [facility]. Pt has has multiple admissions recently, palliative consult has been made, pt is ward of the state and has a guardian. CSW is aware of palliative referral to discuss [place], anticipate pt will return to ALF at DC.   Action/Plan: CSW will arrange for return to facility when ready for DC. No CM needs anticipted, will cont to follow.   Expected Discharge Date:    09/12/2015              Expected Discharge Plan:  Assisted Living / Rest Home  In-House Referral:  Clinical Social Work, Hospice / Palliative Care  Discharge planning Services  CM Consult  Post Acute Care Choice:  NA Choice offered to:  NA  DME Arranged:    DME Agency:     HH Arranged:    [place] Agency:     Status of Service:  In process, will continue to follow  Medicare Important Message Given:    Date Medicare IM Given:    Medicare IM give by:    Date Additional Medicare IM Given:    Additional Medicare Important Message give by:     If discussed at [place] of Stay Meetings, dates discussed:    Additional Comments:  Sherald Barge, RN 09/10/2015, 11:50 AM

## 2015-09-10 NOTE — Clinical Social Work Note (Signed)
Clinical Social Work Assessment  Patient Details  Name: [name] MRN: [id number] Date of Birth: 01-24-1913  Date of referral:  09/10/15               Reason for consult:  Discharge Planning                Permission sought to share information with:    Permission granted to share information::     Name::        Agency::     Relationship::     Contact Information:     Housing/Transportation Living arrangements for the past 2 months:  [place] of Information:  Facility Patient Interpreter Needed:  None Criminal Activity/Legal Involvement Pertinent to Current Situation/Hospitalization:  No - Comment as needed Significant Relationships:  Adult Children Lives with:  Facility Resident Do you feel safe going back to the place where you live?   (will discuss with guardian) Need for family participation in patient care:  Yes (Comment)  Care giving concerns:  Pt is long term resident at ALF.    Social Worker assessment / plan:  CSW spoke with Butch Penny at [facility] regarding pt. Pt has been a resident at [facility] for several years. She is well known to CSW from previous admissions. Pt's guardian is Netta Cedars at [facility]. CSW left voicemail for [place] [facility] requesting return call. Per Butch Penny, pt was vomiting at facility and they sent her to ED for evaluation. She had started an antibiotic for UTI. Staff witnessed seizure in ED. At baseline, Butch Penny reports that pt ambulates with a walker and assist. Okay for return if appropriate. MD has palliative care consult ordered. Will discuss with [facility].   Employment status:  Retired Nurse, adult PT Recommendations:  Not assessed at this time Information / Referral to community resources:  Other (Comment Required) (To be determined)  Patient/Family's Response to care:  Pt unable to participate in assessment.   Patient/Family's Understanding of and Emotional Response to Diagnosis,  Current Treatment, and Prognosis:  Awaiting return call from guardian.   Emotional Assessment Appearance:  Appears stated age Attitude/Demeanor/Rapport:  Unable to Assess Affect (typically observed):  Unable to Assess Orientation:    Alcohol / Substance use:  Not Applicable Psych involvement (Current and /or in the community):  No (Comment)  Discharge Needs  Concerns to be addressed:  Discharge Planning Concerns Readmission within the last 30 days:  No Current discharge risk:  Cognitively Impaired Barriers to Discharge:  Continued Medical Work up   Salome Arnt, [place] 09/10/2015, 12:41 PM [phone]

## 2015-09-10 NOTE — Clinical Social Work Note (Signed)
CSW spoke with Rip Harbour at [facility] regarding pt. She has discussed with family and they are agreeable to DNR and palliative care consult. However, pt does not have DNR paperwork completed. Will notify MD and Rip Harbour will fax to CSW for completion.  [name], [place]

## 2015-09-11 LAB — GLUCOSE, CAPILLARY
GLUCOSE-CAPILLARY: 94 mg/dL (ref 65–99)
Glucose-Capillary: 121 mg/dL — ABNORMAL HIGH (ref 65–99)
Glucose-Capillary: 161 mg/dL — ABNORMAL HIGH (ref 65–99)
Glucose-Capillary: 176 mg/dL — ABNORMAL HIGH (ref 65–99)
Glucose-Capillary: 84 mg/dL (ref 65–99)
Glucose-Capillary: 89 mg/dL (ref 65–99)

## 2015-09-11 NOTE — Progress Notes (Signed)
PROGRESS NOTE    [name]  [id number] DOB: 1912-11-30 DOA: 09/07/2015 PCP: [name], MD   Outpatient Specialists:  Gastro: Dr. Oneida Alar.   Oncology: Dr Whitney Muse   Brief Narrative:  34 yof with a hx of urinary retention, PUD, COPD, DM type 2, GERD, HTN, hypothyroidism, and dementia presented with worsening mental status and emesis. Patient has severe dementia at baseline. Since she initially had fever, hypotension and possible UTI, there was concern for developing sepsis. She received abx for a UTI, but these were discontinued when urine culture showed non specific growth. Additionally, while in the ED, a 3 minute Sz was witnessed by the nursing staff. She was given ativan with resolution of the sz, but remained obtunded throughout ED. This was likely precipitated by fever, but she has not had any recurrence of fever/seizure. Overall she has stabilized, but remains altered and is refusing to eat reliably and/or take medications. Palliative care has been consulted for goals of care and for hospice referral.  Assessment & Plan:   Active Problems:   Diabetes ([place])   COPD (chronic obstructive pulmonary disease) (HCC)   Urinary retention   Acute encephalopathy   Dementia   Sepsis ([place])   Urinary tract infectious disease   Thrombocytopenia ([place])  1. Sepsis. Likely from UTI. Resolved. Lactic acid wnl s/p after adequate hydration. Her antibiotics have been discontinued since blood cultures have shown no growth. Urine culture showed non specific growth.  2. Seizure disorder. Patient witnessed to have a 3 minute seizure in the ED, likely precipitated by fevers, and was treated with Ativan. Will continue Ativan PRN. She has not had any further seizures since coming to the hospital. Continue Sz precautions and Keppra. Lactic acid wnl. 3. Acute encephalopathy. Superimposed on base line dementia. Likely related to sepsis and UTI. According to family she has not yet returned to  baseline. It is possible that with advanced dementia and acute illness, that this may be her new baseline.  4. Acute kidney injury. Possibly related to dehydration and hypotension. Improving with IV hydration. Pt has a hx of chronic urinary retention. Foley cath in place, will continue Flomax, and monitor urine output.  5. DM type 2, diet controlled. Continue SSI. Pt was having episodes of hypoglycemia and required dextrose infusion. Dextrose has since been discontinued and she seems to be maintaining her blood sugars. She is also on a diet, but often refuses to eat/take meds.   6. COPD/ Asthma. Stable. Give albuterol PRN. 7. GERD. Continue PPI 8. Hypothyroidism. TSH wnl. Continue synthroid.  9. Dementia. Severe at baseline. PO intake has been variable but chart indicates that she will eat 25% of meals at best. She often refuses medications and refused to open her mouth. Palliative consulted to address goals of care, will follow up.   DVT prophylaxis: Lovenox Code Status: DNR. Family Communication: discussed with family at the bedside. Pt is ward of state. Disposition Plan: Anticipate discharge within 1-2 days to SNF   Consultants:   Palliative care  Procedures:   none  Antimicrobials:   Cefepime 4/21 >>4/24   Subjective: Patient is awake, but does not answer questions or follow commands.  Objective: Filed Vitals:   09/09/15 2100 09/10/15 1316 09/10/15 2100 09/11/15 0453  BP: 161/63 154/72 148/98 170/81  Pulse: 72 76 77 77  Temp: 99.3 F (37.4 C) 98.9 F (37.2 C) 99.3 F (37.4 C) 98.3 F (36.8 C)  TempSrc: Axillary Axillary Axillary Axillary  Resp: 18 20 18  18  Height:      Weight:      SpO2: 93% 94% 95% 94%    Intake/Output Summary (Last 24 hours) at 09/11/15 0802 Last data filed at 09/11/15 0457  Gross per 24 hour  Intake    120 ml  Output    950 ml  Net   -830 ml   Filed Weights   09/07/15 1621 09/08/15 0400 09/09/15 0500  Weight: 47.6 kg (104 lb 15 oz)  48.3 kg (106 lb 7.7 oz) 50.1 kg (110 lb 7.2 oz)    Examination:  General exam: Appears calm and comfortable  Respiratory system: Clear to auscultation. Respiratory effort normal. Cardiovascular system: S1 & S2 heard, RRR. No JVD, murmurs, rubs, gallops or clicks. No pedal edema. Gastrointestinal system: Abdomen is nondistended, soft and nontender. No organomegaly or masses felt. Normal bowel sounds heard. Central nervous system: Alert. No focal neurological deficits. Extremities: Symmetric 5 x 5 power. Skin: No rashes, lesions or ulcers Psychiatry: nonverbal    Data Reviewed: I have personally reviewed following labs and imaging studies  CBC:  Recent Labs Lab 09/07/15 1222 09/08/15 0656 09/09/15 0455 09/09/15 0834  WBC 17.9* 14.2* 8.8 10.3  NEUTROABS 13.8*  --   --   --   HGB 12.6 11.1* 10.8* 11.5*  HCT 38.9 33.1* 32.9* 34.3*  MCV 98.2 95.9 97.9 96.3  PLT 198 160 33* 0000000   Basic Metabolic Panel:  Recent Labs Lab 09/07/15 1321 09/08/15 0656 09/09/15 0455  NA 146* 144 144  K 3.9 4.6 4.2  CL 108 112* 115*  CO2 28 24 21*  GLUCOSE 133* 80 89  BUN 29* 31* 22*  CREATININE 1.08* 1.38* 1.14*  CALCIUM 9.3 8.4* 8.2*   GFR: Estimated Creatinine Clearance: 20.8 mL/min (by C-G formula based on Cr of 1.14). Liver Function Tests:  Recent Labs Lab 09/07/15 1321 09/08/15 0656  AST 24 19  ALT 15 12*  ALKPHOS 55 48  BILITOT 0.4 0.4  PROT 6.5 6.1*  ALBUMIN 3.5 3.0*   Coagulation Profile:  Recent Labs Lab 09/07/15 1321  INR 1.13   Cardiac Enzymes:  Recent Labs Lab 09/07/15 1321  CKTOTAL 74    Recent Labs Lab 09/10/15 1125 09/10/15 1640 09/10/15 2004 09/10/15 2354 09/11/15 0341  GLUCAP 101* 180* 103* 84 89   Thyroid Function Tests: No results for input(s): TSH, T4TOTAL, FREET4, T3FREE, THYROIDAB in the last 72 hours. Urine analysis:    Component Value Date/Time   COLORURINE YELLOW 09/07/2015 1133   APPEARANCEUR CLEAR 09/07/2015 1133   LABSPEC  1.010 09/07/2015 1133   PHURINE 6.0 09/07/2015 1133   GLUCOSEU NEGATIVE 09/07/2015 1133   HGBUR TRACE* 09/07/2015 1133   [place] 09/07/2015 1133   KETONESUR NEGATIVE 09/07/2015 1133   PROTEINUR TRACE* 09/07/2015 1133   UROBILINOGEN 0.2 10/27/2014 1618   NITRITE POSITIVE* 09/07/2015 1133   LEUKOCYTESUR TRACE* 09/07/2015 1133   Sepsis Labs: @LABRCNTIP (procalcitonin:4,lacticidven:4)  ) Recent Results (from the past 240 hour(s))  Urine culture     Status: Abnormal   Collection Time: 09/07/15 11:33 AM  Result Value Ref Range Status   Specimen Description URINE, CATHETERIZED  Final   Special Requests NONE  Final   Culture (A)  Final    6,000 COLONIES/mL INSIGNIFICANT GROWTH Performed at [facility]    Report Status 09/10/2015 FINAL  Final  Blood Culture (routine x 2)     Status: None (Preliminary result)   Collection Time: 09/07/15 12:38 PM  Result Value Ref Range Status  Specimen Description BLOOD RIGHT HAND  Final   Special Requests BOTTLES DRAWN AEROBIC ONLY 7 CC  Final   Culture NO GROWTH 3 DAYS  Final   Report Status PENDING  Incomplete  Blood Culture (routine x 2)     Status: None (Preliminary result)   Collection Time: 09/07/15 12:39 PM  Result Value Ref Range Status   Specimen Description BLOOD LEFT HAND  Final   Special Requests BOTTLES DRAWN AEROBIC ONLY 7 CC  Final   Culture NO GROWTH 3 DAYS  Final   Report Status PENDING  Incomplete  MRSA PCR Screening     Status: None   Collection Time: 09/07/15  4:30 PM  Result Value Ref Range Status   MRSA by PCR NEGATIVE NEGATIVE Final    Comment:        The GeneXpert MRSA Assay (FDA approved for NASAL specimens only), is one component of a comprehensive MRSA colonization surveillance program. It is not intended to diagnose MRSA infection nor to guide or monitor treatment for MRSA infections.      Radiology Studies: No results found.   Scheduled Meds: . antiseptic oral rinse  7 mL Mouth  Rinse q12n4p  . aspirin  81 mg Oral Daily  . chlorhexidine  15 mL Mouth Rinse BID  . enoxaparin (LOVENOX) injection  30 mg Subcutaneous Q24H  . feeding supplement (ENSURE ENLIVE)  237 mL Oral BID BM  . insulin aspart  0-9 Units Subcutaneous Q4H  . levETIRAcetam  500 mg Intravenous Q12H  . levothyroxine  25 mcg Oral QAC breakfast  . montelukast  10 mg Oral q morning - 10a  . tamsulosin  0.4 mg Oral Daily  . theophylline  130 mg Oral Q8H   Continuous Infusions:     LOS: 4 days    Time spent: 25 minutes   Kathie Dike, MD [facility] Pager [phone]  If 7PM-7AM, please contact night-coverage [email or web address] Password [email or web address] 09/11/2015, 8:02 AM

## 2015-09-11 NOTE — Progress Notes (Addendum)
Initial Nutrition Assessment  INTERVENTION:  Ensure Enlive po BID, each supplement provides 350 kcal and 20 grams of protein   Assist with feeding   Follow results of palliative evaluation   NUTRITION DIAGNOSIS:   Inadequate oral intake related to acute illness as evidenced by meal completion < 50%.  GOAL:  Gentle encouragement for nutrition intake - as pt desires    MONITOR:   PO intake, Supplement acceptance, Weight trends  REASON FOR ASSESSMENT:   Malnutrition Screening Tool    ASSESSMENT:   is a 82 y.o. female with medical history significant of urinary retention, PUD, COPD, DM, GERD, HTN, pancreatitis, hypothyroidism, MGUS, CVA, severe dementia, protein calorie malnutrition, presenting w/ worsening mental status and emesis.   Pt not responding to questions. Daughter and son present during visit. According to them pt usually ambulates with a walker and has been feeding herself. She has been drinking Ensure and prefers vanilla. Her current intake is very poor 0-25% of meals.   Palliative consult pending. Daughter did say they did not want aggressive nutrition intervention such as "feeding tube" but if pt is ward of state not sure who is making the decisions. Hopefully once palliative team talks with them pt desires will be more clear.  Her wt hx is variable 99-110# over the past year.   NFPE: decreased muscle and little fat mass expected with a person her age.   Abnormal labs: BUN and cr. Improved.  Lactic acid- normal (4/22)  Diet Order:  DIET DYS 3 Room service appropriate?: Yes; Fluid consistency:: Thin  Skin:   dry, intact  Last BM:   prior to admission  Height:   Ht Readings from Last 1 Encounters:  09/07/15 5\' 2"  (1.575 m)    Weight:   Wt Readings from Last 1 Encounters:  09/09/15 110 lb 7.2 oz (50.1 kg)    Ideal Body Weight:  50 kg  BMI:  Body mass index is 20.2 kg/(m^2).  Estimated Nutritional Needs:   Kcal:  1200-1300  Protein:  60-65  gr   Fluid:  1.2-1.3 liters daily  EDUCATION NEEDS:   No education needs identified at this time    Colman Cater MS,RD,CSG,LDN Office: [phone] Pager: [phone]

## 2015-09-11 NOTE — Consult Note (Signed)
   [facility] CM Inpatient Consult   09/11/2015  [name] 05-25-1912 [id number]  Patient screened for potential [place] Management services. Patient is eligible for [place]. Chart reviewed and reveals patient resides in long term care facility and  discharge plan is to return upon discharge with possible Hospice care,  there were no identifiable [facility] care management needs at this time. [facility] Care Management services not appropriate at this time.  If patient's post hospital needs change please place a [facility] Care Management consult.  For questions please contact:    Royetta Crochet. Laymond Purser, RN, BSN, [place] [phone]) Business Cell  [phone]) Toll Free Office

## 2015-09-11 NOTE — Clinical Social Work Note (Addendum)
CSW faxed completed paperwork from MD regarding DNR. Discussed with [name] at [facility]. Awaiting palliative care consult.  Update: Per [name], okay to change code status to DNR. MD notified.  [name], [place]

## 2015-09-12 ENCOUNTER — Encounter (HOSPITAL_COMMUNITY): Payer: Self-pay | Admitting: Primary Care

## 2015-09-12 DIAGNOSIS — Z515 Encounter for palliative care: Secondary | ICD-10-CM | POA: Insufficient documentation

## 2015-09-12 LAB — CULTURE, BLOOD (ROUTINE X 2)
Culture: NO GROWTH
Culture: NO GROWTH

## 2015-09-12 LAB — GLUCOSE, CAPILLARY
GLUCOSE-CAPILLARY: 104 mg/dL — AB (ref 65–99)
GLUCOSE-CAPILLARY: 163 mg/dL — AB (ref 65–99)
GLUCOSE-CAPILLARY: 102 mg/dL — AB (ref 65–99)
Glucose-Capillary: 99 mg/dL (ref 65–99)

## 2015-09-12 MED ORDER — MORPHINE SULFATE (CONCENTRATE) 10 MG/0.5ML PO SOLN: 2.5000 mg | ORAL | Status: DC | PRN

## 2015-09-12 MED ORDER — LORAZEPAM 1 MG PO TABS
1.0000 mg | ORAL_TABLET | Freq: Three times a day (TID) | ORAL | Status: DC
  Administered 2015-09-12 – 2015-09-13 (×3): 1 mg via ORAL
  Filled 2015-09-12 (×3): qty 1

## 2015-09-12 MED ORDER — HYDROCOD POLST-CPM POLST ER 10-8 MG/5ML PO SUER: 2.5000 mL | Freq: Two times a day (BID) | ORAL | Status: DC | PRN

## 2015-09-12 MED ORDER — HYDRALAZINE HCL 20 MG/ML IJ SOLN
10.0000 mg | INTRAMUSCULAR | Status: DC | PRN
  Filled 2015-09-12: qty 1

## 2015-09-12 NOTE — Care Management Note (Signed)
Case Management Note  Patient Details  Name: LAVENDA VILLALOVOS MRN: [id number] Date of Birth: 1912-12-27  Expected Discharge Date:     09/13/2015             Expected Discharge Plan:  [place]  In-House Referral:  Clinical Social Work, Hospice / Palliative Care  Discharge planning Services  CM Consult  Post Acute Care Choice:  NA Choice offered to:  NA  DME Arranged:    DME Agency:     HH Arranged:    [place] Agency:     Status of Service:  Completed, signed off  Medicare Important Message Given:    yes Date Medicare IM Given:    Medicare IM give by:    Date Additional Medicare IM Given:    Additional Medicare Important Message give by:     If discussed at [place] of Stay Meetings, dates discussed:    Additional Comments: Pt's guardian (DSS) has chosen comfort measures for pt and agreeable to pt to [facility]. No beds available the facility and pt will be transitioned to [facility]IP once DSS has completed paperwork, anticipate this will ocure on 09/13/2015.  Sherald Barge, RN 09/12/2015, 3:44 PM

## 2015-09-12 NOTE — Progress Notes (Signed)
PROGRESS NOTE    [name]  [id number] DOB: 1912/07/01 DOA: 09/07/2015 PCP: [name], MD  Outpatient Specialists:    Brief Narrative:  6 yof with a hx of urinary retention, PUD, COPD, DM type 2, GERD, HTN, hypothyroidism, and dementia presented with worsening mental status and emesis. Patient has severe dementia at baseline. Since she initially had fever, hypotension and possible UTI, there was concern for developing sepsis. She received abx for a UTI, but these were discontinued when urine culture showed non specific growth. Additionally, while in the ED, a 3 minute Sz was witnessed by the nursing staff. She was given ativan with resolution of the sz, but remained obtunded throughout ED. This was likely precipitated by fever, but she has not had any recurrence of fever/seizure. Overall she has stabilized, but remains altered and is refusing to eat reliably and/or take medications.    Assessment & Plan:   Active Problems:   Diabetes ([place])   COPD (chronic obstructive pulmonary disease) (HCC)   Urinary retention   Acute encephalopathy   Dementia   Sepsis ([place])   Urinary tract infectious disease   Palliative care encounter    Sepsis. Likely from UTI.   Resolved.   Lactic acid wnl s/p after adequate hydration.   Antibiotics have been discontinued since blood cultures have shown no growth.  Urine culture showed non specific growth.   Seizure disorder.   Patient witnessed to have a 3 minute seizure in the ED, likely precipitated by fevers, and was treated with Ativan.   Will continue Ativan PRN.   She has not had any further seizures since coming to the hospital.   Continue Sz precautions and Keppra.  Acute encephalopathy.   Superimposed on base line dementia.   Likely related to presenting sepsis and UTI.   Possible that with advanced dementia and acute illness, that this may be her new baseline.   Acute kidney injury.   Possibly  related to dehydration and hypotension.   Improving with IV hydration. Pt has a hx of chronic urinary retention.   Foley cath remains in place, will continue Flomax, and monitor urine output.   DM type 2, diet controlled.   Continue SSI.   Pt was having episodes of hypoglycemia and required dextrose infusion. Dextrose has since been discontinued and she seems to be maintaining her blood sugars.   She is also on a diet, but often refuses to eat/take meds.   COPD/ Asthma.   Stable thus far.   Cont albuterol PRN.  GERD.   Continue PPI as tolerated  Hypothyroidism.   TSH wnl.   Continue synthroid as tolerated  Dementia.   Noted to be severe at baseline.   PO intake has been variable but chart indicates that she will eat around 25% of meals at best.   Pt has been noted to refuse medications and at times refused to open her mouth. Palliative Care was consulted. Recs for full comfort care at [facility] home   DVT prophylaxis: Full comfort care Code Status: DNR Family Communication: Pt in room Disposition Plan: Hospice pending   Consultants:   Palliative Care  Procedures:    Antimicrobials:   Cefepime 4/21 >>4/24    Subjective: Pleasantly confused this AM. Denies pain  Objective: Filed Vitals:   09/12/15 0500 09/12/15 0802 09/12/15 0937 09/12/15 1331  BP: 185/86 165/61 162/54 158/69  Pulse: 80 77 73 76  Temp: 98.1 F (36.7 C)   98 F (36.7 C)  TempSrc: Oral   Oral  Resp: 18   18  Height:      Weight:      SpO2: 99%   98%    Intake/Output Summary (Last 24 hours) at 09/12/15 1556 Last data filed at 09/12/15 1200  Gross per 24 hour  Intake    360 ml  Output    550 ml  Net   -190 ml   Filed Weights   09/07/15 1621 09/08/15 0400 09/09/15 0500  Weight: 47.6 kg (104 lb 15 oz) 48.3 kg (106 lb 7.7 oz) 50.1 kg (110 lb 7.2 oz)    Examination:  General exam: Appears calm and comfortable  Respiratory system: Clear to  auscultation. Respiratory effort normal. Cardiovascular system: S1 & S2 heard, RRR.  Gastrointestinal system: Abdomen is nondistended, soft and non-tender. Normal bowel sounds heard. Central nervous system: Alert . No focal neurological deficits. Extremities: Symmetric 5 x 5 power. Skin: No rashes, lesions Psychiatry: . Mood & affect appropriate., no auditory/visual hallucinations    Data Reviewed: I have personally reviewed following labs and imaging studies  CBC:  Recent Labs Lab 09/07/15 1222 09/08/15 0656 09/09/15 0455 09/09/15 0834  WBC 17.9* 14.2* 8.8 10.3  NEUTROABS 13.8*  --   --   --   HGB 12.6 11.1* 10.8* 11.5*  HCT 38.9 33.1* 32.9* 34.3*  MCV 98.2 95.9 97.9 96.3  PLT 198 160 33* 0000000   Basic Metabolic Panel:  Recent Labs Lab 09/07/15 1321 09/08/15 0656 09/09/15 0455  NA 146* 144 144  K 3.9 4.6 4.2  CL 108 112* 115*  CO2 28 24 21*  GLUCOSE 133* 80 89  BUN 29* 31* 22*  CREATININE 1.08* 1.38* 1.14*  CALCIUM 9.3 8.4* 8.2*   GFR: Estimated Creatinine Clearance: 20.8 mL/min (by C-G formula based on Cr of 1.14). Liver Function Tests:  Recent Labs Lab 09/07/15 1321 09/08/15 0656  AST 24 19  ALT 15 12*  ALKPHOS 55 48  BILITOT 0.4 0.4  PROT 6.5 6.1*  ALBUMIN 3.5 3.0*   No results for input(s): LIPASE, AMYLASE in the last 168 hours. No results for input(s): AMMONIA in the last 168 hours. Coagulation Profile:  Recent Labs Lab 09/07/15 1321  INR 1.13   Cardiac Enzymes:  Recent Labs Lab 09/07/15 1321  CKTOTAL 74   BNP (last 3 results) No results for input(s): PROBNP in the last 8760 hours. HbA1C: No results for input(s): HGBA1C in the last 72 hours. CBG:  Recent Labs Lab 09/11/15 2007 09/11/15 2345 09/12/15 0338 09/12/15 0708 09/12/15 1102  GLUCAP 176* 121* 104* 99 163*   Lipid Profile: No results for input(s): CHOL, HDL, LDLCALC, TRIG, CHOLHDL, LDLDIRECT in the last 72 hours. Thyroid Function Tests: No results for input(s): TSH,  T4TOTAL, FREET4, T3FREE, THYROIDAB in the last 72 hours. Anemia Panel: No results for input(s): VITAMINB12, FOLATE, FERRITIN, TIBC, IRON, RETICCTPCT in the last 72 hours. Urine analysis:    Component Value Date/Time   COLORURINE YELLOW 09/07/2015 1133   APPEARANCEUR CLEAR 09/07/2015 1133   LABSPEC 1.010 09/07/2015 1133   PHURINE 6.0 09/07/2015 1133   GLUCOSEU NEGATIVE 09/07/2015 1133   HGBUR TRACE* 09/07/2015 1133   BILIRUBINUR NEGATIVE 09/07/2015 1133   KETONESUR NEGATIVE 09/07/2015 1133   PROTEINUR TRACE* 09/07/2015 1133   UROBILINOGEN 0.2 10/27/2014 1618   NITRITE POSITIVE* 09/07/2015 1133   LEUKOCYTESUR TRACE* 09/07/2015 1133   Sepsis Labs: @LABRCNTIP (procalcitonin:4,lacticidven:4)  ) Recent Results (from the past 240 hour(s))  Urine culture     Status:  Abnormal   Collection Time: 09/07/15 11:33 AM  Result Value Ref Range Status   Specimen Description URINE, CATHETERIZED  Final   Special Requests NONE  Final   Culture (A)  Final    6,000 COLONIES/mL INSIGNIFICANT GROWTH Performed at [facility]    Report Status 09/10/2015 FINAL  Final  Blood Culture (routine x 2)     Status: None   Collection Time: 09/07/15 12:38 PM  Result Value Ref Range Status   Specimen Description BLOOD RIGHT HAND  Final   Special Requests BOTTLES DRAWN AEROBIC ONLY 7 CC  Final   Culture NO GROWTH 5 DAYS  Final   Report Status 09/12/2015 FINAL  Final  Blood Culture (routine x 2)     Status: None   Collection Time: 09/07/15 12:39 PM  Result Value Ref Range Status   Specimen Description BLOOD LEFT HAND  Final   Special Requests BOTTLES DRAWN AEROBIC ONLY 7 CC  Final   Culture NO GROWTH 5 DAYS  Final   Report Status 09/12/2015 FINAL  Final  MRSA PCR Screening     Status: None   Collection Time: 09/07/15  4:30 PM  Result Value Ref Range Status   MRSA by PCR NEGATIVE NEGATIVE Final    Comment:        The GeneXpert MRSA Assay (FDA approved for NASAL specimens only), is one component  of a comprehensive MRSA colonization surveillance program. It is not intended to diagnose MRSA infection nor to guide or monitor treatment for MRSA infections.          Radiology Studies: No results found.    Scheduled Meds: . chlorhexidine  15 mL Mouth Rinse BID  . feeding supplement (ENSURE ENLIVE)  237 mL Oral BID BM  . LORazepam  1 mg Oral TID   Continuous Infusions:    LOS: 5 days    Shad Ledvina, Orpah Melter, MD [facility] Pager [phone]  If 7PM-7AM, please contact night-coverage [email or web address] Password [facility] 09/12/2015, 3:56 PM

## 2015-09-12 NOTE — Care Management Important Message (Signed)
Important Message  Patient Details  Name: [name] MRN: [id number] Date of Birth: 1912-12-15   Medicare Important Message Given:  Yes    Sherald Barge, RN 09/12/2015, 3:46 PM

## 2015-09-12 NOTE — Consult Note (Signed)
Consultation Note Date: 09/12/2015   Patient Name: [name]  DOB: 1912-06-04  MRN: [id number]  Age / Sex: 82 y.o., female  PCP: Sharilyn Sites, MD Referring Physician: Donne Hazel, MD  Reason for Consultation: Disposition, Establishing goals of care, Inpatient hospice referral and Psychosocial/spiritual support  HPI/Patient Profile: 82 y.o. female  with past medical history of COPD, DM, GE RD, HTN, pancreatitis, urinary retention, PUD, and severe dementia with protein calorie malnutrition admitted on 09/07/2015 with worsening mental status and emesis.   Clinical Assessment and Goals of Care: [name] is resting quietly in bed. She has her eyes open and briefly makes eye contact with me as I enter. She smiles but is unable to communicate in any meaningful way, with mostly unintelligible speech.  Call to Kelly. We talk about [name]'s chronic and current health conditions. Rip Harbour shares that [name]'s family desire is to focus on comfort at this time. We talk about disposition and discharge planning, and that [name] would not be a candidate to return to [facility]. We discuss SNF for custodial care versus hospice home. Rip Harbour shares that she has spoken with family and their desire is for inpatient hospice to provide comfort and dignity for [name] during her final days.  We talk about symptom management and medications. Medications that are not focused on comfort are discontinued and medications for symptom management are added.  Rip Harbour shares that she will contact [name]'s family regarding disposition to [facility].  Contacts/Participants in Discussion: Netta Cedars DSS SW Guardian. [phone] Primary Decision Maker/Relationship: DSS SW Linward Headland  HCPOA: yes DSS SW Linward Headland  SUMMARY OF  RECOMMENDATIONS   inpatient hospice, full comfort care, at [facility]. Code Status/Advance Care Planning:    Code Status Orders        Start     Ordered   09/11/15 [id number]  Do not attempt resuscitation (DNR)   Continuous    Question Answer Comment  In the event of cardiac or respiratory ARREST Do not call a "code blue"   In the event of cardiac or respiratory ARREST Do not perform Intubation, CPR, defibrillation or ACLS   In the event of cardiac or respiratory ARREST Use medication by any route, position, wound care, and other measures to relive pain and suffering. May use oxygen, suction and manual treatment of airway obstruction as needed for comfort.      09/11/15 1156    Code Status History    Date Active Date Inactive Code Status Order ID Comments User Context   09/10/2015  2:10 PM 09/11/2015 11:56 AM Full Code [id number]  Kathie Dike, MD Inpatient   09/07/2015  4:44 PM 09/10/2015  2:10 PM DNR [id number]  Waldemar Dickens, MD Inpatient   07/26/2015  4:43 PM 07/29/2015 10:12 PM DNR [id number]  Norval Morton, MD ED   07/05/2015  6:01 PM 07/06/2015  4:08 PM DNR [id number]  Orvan Falconer, MD Inpatient   07/03/2015  9:29 PM 07/05/2015  6:01 PM Full Code [id number]  Orvan Falconer, MD Inpatient   04/26/2015  9:04 AM 04/27/2015  5:16 PM Full Code [id number]  Albertine Patricia, MD Inpatient   04/25/2015 12:00 PM 04/26/2015  9:04 AM DNR [id number]  Albertine Patricia, MD ED   07/25/2014  4:12 PM 07/28/2014  5:56 PM Full Code [id number]  Doree Albee, MD ED   02/01/2014 11:58 PM 02/09/2014  4:53 PM Full Code [id number]  Orvan Falconer, MD Inpatient    Advance Directive Documentation        Most Recent Value   Type of Advance Directive  Out of facility DNR (pink MOST or yellow form)   Pre-existing out of facility DNR order (yellow form or pink MOST form)  Physician notified to receive inpatient order   "MOST" Form in Place?       DNR, allowing natural death  Other Directives:None  Symptom Management:     morphine 2.5 mg sublingual Q2 hours PRN increase respiratory rate or work of breathing  Ativan 1 mg sublingual TID seizure precaution.  Palliative Prophylaxis:   Oral Care and Turn Reposition  Additional Recommendations (Limitations, Scope, Preferences):  Full Comfort Care  Psycho-social/Spiritual:   Desire for further Chaplaincy support:yes  Additional Recommendations: Caregiving  Support/Resources, Education on Hospice and Grief/Bereavement Support  Prognosis:   < 4 weeks, based on poor PO intake, advanced dementia, and families desire to focus on comfort only.  Discharge Planning: [facility] facility      Primary Diagnoses: Present on Admission:  . Sepsis ([place]) . Acute encephalopathy . COPD (chronic obstructive pulmonary disease) ([place]) . Dementia . Urinary tract infectious disease . Urinary retention  I have reviewed the medical record, interviewed the patient and family, and examined the patient. The following aspects are pertinent.  Past Medical History  Diagnosis Date  . Urinary retention   . Gastritis   . Esophageal ulcer   . Salmonella sepsis ([place])   . Degenerative disk disease   . Spinal stenosis   . Asthma   . COPD (chronic obstructive pulmonary disease) ([place])   . Diabetes mellitus, type 2 ([place])   . Diverticulosis of colon   . GERD (gastroesophageal reflux disease)   . Hypertension   . Osteoarthritis   . Hiatal hernia   . Pancreatitis   . S/P endoscopy 2009    Dr. Oneida Alar: gastritis  . Diverticulitis   . S/P endoscopy March 2012    Dr. Gala Romney: NSAID induced ulcer  . Hypothyroidism   . MGUS (monoclonal gammopathy of unknown significance)   . Stroke ([place])   . Dementia   . Renal disorder   . DNR (do not resuscitate)     admission 07/06/15  . C. difficile colitis   . Dementia   . Encephalopathy   . Protein calorie malnutrition ([place])   . Urinary retention   . Cholangitis   . Cholelithiasis with biliary  obstruction   . MGUS (monoclonal gammopathy of unknown significance)   . PUD (peptic ulcer disease)   . Seizure [facility])    Social History   Social History  . Marital Status: Widowed    Spouse Name: N/A  . Number of Children: N/A  . Years of Education: N/A   Social History Main Topics  . Smoking status: Never Smoker   . Smokeless tobacco: Never Used  . Alcohol Use: No  . Drug Use: No  . Sexual Activity: Not Currently   Other Topics  Concern  . None   Social History Narrative   Family History  Problem Relation Age of Onset  . Diabetes Mother    Scheduled Meds: . chlorhexidine  15 mL Mouth Rinse BID  . feeding supplement (ENSURE ENLIVE)  237 mL Oral BID BM  . LORazepam  1 mg Oral TID   Continuous Infusions:  PRN Meds:.morphine CONCENTRATE, ondansetron **OR** ondansetron (ZOFRAN) IV Medications Prior to Admission:  Prior to Admission medications   Medication Sig Start Date End Date Taking? Authorizing Provider  acetaminophen (TYLENOL) 325 MG tablet Take 2 tablets (650 mg total) by mouth every 6 (six) hours as needed for mild pain (or Fever >/= 101). 04/27/15  Yes Albertine Patricia, MD  aspirin 81 MG tablet Take 81 mg by mouth daily.   Yes Historical Provider, MD  Calcium Citrate-Vitamin D (CITRACAL PETITES/VITAMIN D) 200-250 MG-UNIT TABS Take 1 tablet by mouth daily.   Yes Historical Provider, MD  carbamide peroxide (EARWAX TREATMENT DROPS) 6.5 % otic solution Place 5 drops into both ears 2 (two) times daily as needed (build up).   Yes Historical Provider, MD  levETIRAcetam (KEPPRA) 250 MG tablet Take 1 tablet (250 mg total) by mouth daily. 02/09/14  Yes [name], DO  levothyroxine (SYNTHROID, LEVOTHROID) 25 MCG tablet Take 25 mcg by mouth daily before breakfast.   Yes Historical Provider, MD  montelukast (SINGULAIR) 10 MG tablet Take 10 mg by mouth every morning.    Yes Historical Provider, MD  polyethylene glycol (MIRALAX / GLYCOLAX) packet Take 17 g by mouth daily as  needed for mild constipation.   Yes Historical Provider, MD  solifenacin (VESICARE) 5 MG tablet Take 5 mg by mouth daily.   Yes Historical Provider, MD  tamsulosin (FLOMAX) 0.4 MG CAPS capsule Take 1 capsule (0.4 mg total) by mouth daily. 02/08/14  Yes [name], DO  theophylline (UNIPHYL) 400 MG 24 hr tablet Take 400 mg by mouth daily.   Yes Historical Provider, MD  amoxicillin-clavulanate (AUGMENTIN) 500-125 MG tablet Take 1 tablet (500 mg total) by mouth 3 (three) times daily. Patient not taking: Reported on 09/07/2015 07/29/15   [name], MD   No Known Allergies Review of Systems  Unable to perform ROS: Dementia    Physical Exam  Constitutional: No distress.  Pulmonary/Chest: Effort normal. No respiratory distress.  Abdominal: Soft. She exhibits no distension. There is no guarding.  Neurological: She is alert.  Mostly unintelligible speech, but will make eye contact briefly.  Skin: Skin is warm and dry.  Nursing note and vitals reviewed.   Vital Signs: BP 162/54 mmHg  Pulse 73  Temp(Src) 98.1 F (36.7 C) (Oral)  Resp 18  Ht 5\' 2"  (1.575 m)  Wt 50.1 kg (110 lb 7.2 oz)  BMI 20.20 kg/m2  SpO2 99% Pain Assessment: No/denies pain       SpO2: SpO2: 99 % O2 Device:SpO2: 99 % O2 Flow Rate: .   IO: Intake/output summary:  Intake/Output Summary (Last 24 hours) at 09/12/15 1136 Last data filed at 09/12/15 0800  Gross per 24 hour  Intake    360 ml  Output    850 ml  Net   -490 ml    LBM: Last BM Date: 09/08/15 Baseline Weight: Weight: 47.6 kg (104 lb 15 oz) Most recent weight: Weight: 50.1 kg (110 lb 7.2 oz)     Palliative Assessment/Data:   Flowsheet Rows        Most Recent Value   Intake Tab  Referral Department  Hospitalist   Unit at Time of Referral  Med/Surg Unit   Palliative Care Primary Diagnosis  Neurology   Date Notified  09/10/15   Palliative Care Type  New Palliative care   Reason for referral  Clarify Goals of Care   Date of  Admission  09/07/15   Date first seen by Palliative Care  09/12/15   # of days Palliative referral response time  2 Day(s)   # of days IP prior to Palliative referral  3   Clinical Assessment    Palliative Performance Scale Score  20%   Pain Max last 24 hours  Not able to report   Pain Min Last 24 hours  Not able to report   Dyspnea Max Last 24 Hours  Not able to report   Dyspnea Min Last 24 hours  Not able to report   Psychosocial & Spiritual Assessment    Palliative Care Outcomes    Patient/Family meeting held?  Yes   Who was at the meeting?  DSS SW Rip Harbour via phone   Palliative Care Outcomes  Transitioned to hospice, Provided psychosocial or spiritual support, Provided end of life care assistance   Patient/Family wishes: Interventions discontinued/not started   Mechanical Ventilation, PEG   Palliative Care follow-up planned  -- [follow up at [place]      Time In: 0832 Time Out: 0925 Time Total: 50 minutes  GOC discussion shared with nursing staff, case manager, social worker, and Dr. Wyline Copas. Greater than 50%  of this time was spent counseling and coordinating care related to the above assessment and plan.  Signed by: Drue Novel, NP   Please contact Palliative Medicine Team phone at [phone] for questions and concerns.

## 2015-09-12 NOTE — Progress Notes (Signed)
MEDICATION RELATED CONSULT NOTE - INITIAL   Pharmacy Consult for potential interactions with AED medications Pt on Keppra IV  No Known Allergies  Patient Measurements: Height: 5\' 2"  (157.5 cm) Weight: 110 lb 7.2 oz (50.1 kg) IBW/kg (Calculated) : 50.1  Vital Signs: Temp: 98.1 F (36.7 C) (04/26 0500) Temp Source: Oral (04/26 0500) BP: 165/61 mmHg (04/26 0802) Pulse Rate: 77 (04/26 0802) Intake/Output from previous day: 04/25 0701 - 04/26 0700 In: 240 [P.O.:240] Out: 850 [Urine:850] Intake/Output from this shift: Total I/O In: 120 [P.O.:120] Out: -   Labs: No results for input(s): WBC, HGB, HCT, PLT, APTT, CREATININE, LABCREA, CREATININE, CREAT24HRUR, MG, PHOS, ALBUMIN, PROT, ALBUMIN, AST, ALT, ALKPHOS, BILITOT, BILIDIR, IBILI in the last 72 hours. Estimated Creatinine Clearance: 20.8 mL/min (by C-G formula based on Cr of 1.14).  Medical History: Past Medical History  Diagnosis Date  . Urinary retention   . Gastritis   . Esophageal ulcer   . Salmonella sepsis ([place])   . Degenerative disk disease   . Spinal stenosis   . Asthma   . COPD (chronic obstructive pulmonary disease) ([place])   . Diabetes mellitus, type 2 ([place])   . Diverticulosis of colon   . GERD (gastroesophageal reflux disease)   . Hypertension   . Osteoarthritis   . Hiatal hernia   . Pancreatitis   . S/P endoscopy 2009    Dr. Oneida Alar: gastritis  . Diverticulitis   . S/P endoscopy March 2012    Dr. Gala Romney: NSAID induced ulcer  . Hypothyroidism   . MGUS (monoclonal gammopathy of unknown significance)   . Stroke ([place])   . Dementia   . Renal disorder   . DNR (do not resuscitate)     admission 07/06/15  . C. difficile colitis   . Dementia   . Encephalopathy   . Protein calorie malnutrition ([place])   . Urinary retention   . Cholangitis   . Cholelithiasis with biliary obstruction   . MGUS (monoclonal gammopathy of unknown significance)   . PUD (peptic ulcer disease)   . Seizure ([place])     Current  facility-administered medications:  .  antiseptic oral rinse (CPC / CETYLPYRIDINIUM CHLORIDE 0.05%) solution 7 mL, 7 mL, Mouth Rinse, q12n4p, Waldemar Dickens, MD, 7 mL at 09/11/15 1600 .  aspirin chewable tablet 81 mg, 81 mg, Oral, Daily, Kathie Dike, MD, 81 mg at 09/11/15 0845 .  chlorhexidine (PERIDEX) 0.12 % solution 15 mL, 15 mL, Mouth Rinse, BID, Waldemar Dickens, MD, 15 mL at 09/11/15 1103 .  enoxaparin (LOVENOX) injection 30 mg, 30 mg, Subcutaneous, Q24H, Kathie Dike, MD, 30 mg at 09/11/15 1734 .  feeding supplement (ENSURE ENLIVE) (ENSURE ENLIVE) liquid 237 mL, 237 mL, Oral, BID BM, Kathie Dike, MD, 237 mL at 09/11/15 1343 .  hydrALAZINE (APRESOLINE) injection 10 mg, 10 mg, Intravenous, Q4H PRN, Donne Hazel, MD .  insulin aspart (novoLOG) injection 0-9 Units, 0-9 Units, Subcutaneous, Q4H, Kathie Dike, MD, 2 Units at 09/11/15 2030 .  levETIRAcetam (KEPPRA) IVPB 500 mg/100 mL premix, 500 mg, Intravenous, Q12H, Waldemar Dickens, MD, 500 mg at 09/12/15 [id number] .  levothyroxine (SYNTHROID, LEVOTHROID) tablet 25 mcg, 25 mcg, Oral, QAC breakfast, Waldemar Dickens, MD, 25 mcg at 09/12/15 [phone] .  LORazepam (ATIVAN) injection 1-2 mg, 1-2 mg, Intravenous, Q2H PRN, Waldemar Dickens, MD .  montelukast (SINGULAIR) tablet 10 mg, 10 mg, Oral, q morning - 10a, Waldemar Dickens, MD, 10 mg at 09/11/15 0845 .  morphine 2 MG/ML  injection 2 mg, 2 mg, Intravenous, Q2H PRN, Waldemar Dickens, MD .  ondansetron [facility]) tablet 4 mg, 4 mg, Oral, Q6H PRN **OR** ondansetron (ZOFRAN) injection 4 mg, 4 mg, Intravenous, Q6H PRN, Waldemar Dickens, MD .  tamsulosin [facility]) capsule 0.4 mg, 0.4 mg, Oral, Daily, Waldemar Dickens, MD, 0.4 mg at 09/11/15 0846 .  theophylline 80 MG/15ML elixir 130 mg, 130 mg, Oral, Q8H, Kathie Dike, MD, 130 mg at 09/11/15 1338  Assessment: 82yo female.  Current medications reviewed (listed above).  No significant drug interactions with Keppra noted.  She has not had any seizures since  initial seizure in ED.   Plan:  Pharmacy will sign off.  Please advise if we can be of further assistance  [name] 09/12/2015,9:32 AM

## 2015-09-12 NOTE — Clinical Social Work Note (Signed)
CSW discussed palliative consult with Netta Cedars at [place]. She states that she has talked with An[facility] Boss and they are going to move forward with [facility] as there are no beds available at [facility]. [name] plans to notify [facility]. Referral to be faxed to hospice.  [name], [place]

## 2015-09-13 ENCOUNTER — Inpatient Hospital Stay (HOSPITAL_COMMUNITY)
Admission: RE | Admit: 2015-09-13 | Discharge: 2015-09-17 | DRG: 871 | Disposition: A | Discharge status: Skilled Nursing Facility | Source: Ambulatory Visit | Attending: Internal Medicine | Admitting: Internal Medicine

## 2015-09-13 DIAGNOSIS — G40909 Epilepsy, unspecified, not intractable, without status epilepticus: Secondary | ICD-10-CM | POA: Diagnosis present

## 2015-09-13 DIAGNOSIS — F0390 Unspecified dementia without behavioral disturbance: Secondary | ICD-10-CM | POA: Diagnosis not present

## 2015-09-13 DIAGNOSIS — J449 Chronic obstructive pulmonary disease, unspecified: Secondary | ICD-10-CM | POA: Diagnosis present

## 2015-09-13 DIAGNOSIS — E860 Dehydration: Secondary | ICD-10-CM | POA: Diagnosis not present

## 2015-09-13 DIAGNOSIS — R112 Nausea with vomiting, unspecified: Secondary | ICD-10-CM | POA: Diagnosis not present

## 2015-09-13 DIAGNOSIS — A419 Sepsis, unspecified organism: Principal | ICD-10-CM | POA: Diagnosis present

## 2015-09-13 DIAGNOSIS — R279 Unspecified lack of coordination: Secondary | ICD-10-CM | POA: Diagnosis not present

## 2015-09-13 DIAGNOSIS — N390 Urinary tract infection, site not specified: Secondary | ICD-10-CM | POA: Diagnosis present

## 2015-09-13 DIAGNOSIS — R1311 Dysphagia, oral phase: Secondary | ICD-10-CM | POA: Diagnosis not present

## 2015-09-13 DIAGNOSIS — Z66 Do not resuscitate: Secondary | ICD-10-CM | POA: Diagnosis present

## 2015-09-13 DIAGNOSIS — F509 Eating disorder, unspecified: Secondary | ICD-10-CM | POA: Diagnosis present

## 2015-09-13 DIAGNOSIS — E0843 Diabetes mellitus due to underlying condition with diabetic autonomic (poly)neuropathy: Secondary | ICD-10-CM | POA: Diagnosis not present

## 2015-09-13 DIAGNOSIS — N179 Acute kidney failure, unspecified: Secondary | ICD-10-CM | POA: Diagnosis present

## 2015-09-13 DIAGNOSIS — K219 Gastro-esophageal reflux disease without esophagitis: Secondary | ICD-10-CM | POA: Diagnosis present

## 2015-09-13 DIAGNOSIS — Z7401 Bed confinement status: Secondary | ICD-10-CM | POA: Diagnosis not present

## 2015-09-13 DIAGNOSIS — N3281 Overactive bladder: Secondary | ICD-10-CM | POA: Diagnosis not present

## 2015-09-13 DIAGNOSIS — G9340 Encephalopathy, unspecified: Secondary | ICD-10-CM | POA: Diagnosis not present

## 2015-09-13 DIAGNOSIS — E039 Hypothyroidism, unspecified: Secondary | ICD-10-CM | POA: Diagnosis not present

## 2015-09-13 DIAGNOSIS — E119 Type 2 diabetes mellitus without complications: Secondary | ICD-10-CM | POA: Diagnosis not present

## 2015-09-13 DIAGNOSIS — I10 Essential (primary) hypertension: Secondary | ICD-10-CM | POA: Diagnosis not present

## 2015-09-13 DIAGNOSIS — Z515 Encounter for palliative care: Secondary | ICD-10-CM | POA: Diagnosis present

## 2015-09-13 DIAGNOSIS — R339 Retention of urine, unspecified: Secondary | ICD-10-CM | POA: Diagnosis not present

## 2015-09-13 DIAGNOSIS — R1313 Dysphagia, pharyngeal phase: Secondary | ICD-10-CM | POA: Diagnosis not present

## 2015-09-13 DIAGNOSIS — E11649 Type 2 diabetes mellitus with hypoglycemia without coma: Secondary | ICD-10-CM | POA: Diagnosis present

## 2015-09-13 MED ORDER — HYDROCOD POLST-CPM POLST ER 10-8 MG/5ML PO SUER: 2.5000 mL | Freq: Two times a day (BID) | ORAL | Status: DC | PRN

## 2015-09-13 MED ORDER — ONDANSETRON HCL 4 MG/2ML IJ SOLN: 4.0000 mg | Freq: Four times a day (QID) | INTRAMUSCULAR | Status: DC | PRN

## 2015-09-13 MED ORDER — LORAZEPAM 0.5 MG PO TABS: 1.0000 mg | ORAL_TABLET | ORAL | Status: DC | PRN

## 2015-09-13 MED ORDER — LORAZEPAM 1 MG PO TABS: 1.0000 mg | ORAL_TABLET | Freq: Three times a day (TID) | ORAL | Status: DC

## 2015-09-13 MED ORDER — HALOPERIDOL LACTATE 2 MG/ML PO CONC: 0.5000 mg | ORAL | Status: DC | PRN

## 2015-09-13 MED ORDER — GLYCOPYRROLATE 0.2 MG/ML IJ SOLN
0.2000 mg | INTRAMUSCULAR | Status: DC | PRN
  Filled 2015-09-13: qty 1

## 2015-09-13 MED ORDER — MORPHINE SULFATE (CONCENTRATE) 10 MG/0.5ML PO SOLN: 2.5000 mg | ORAL | Status: DC | PRN

## 2015-09-13 MED ORDER — ACETAMINOPHEN 325 MG PO TABS: 650.0000 mg | ORAL_TABLET | Freq: Four times a day (QID) | ORAL | Status: DC | PRN

## 2015-09-13 MED ORDER — LORAZEPAM 1 MG PO TABS: 1.0000 mg | ORAL_TABLET | ORAL | Status: DC | PRN

## 2015-09-13 MED ORDER — ACETAMINOPHEN 650 MG RE SUPP: 650.0000 mg | Freq: Four times a day (QID) | RECTAL | Status: DC | PRN

## 2015-09-13 MED ORDER — CHLORHEXIDINE GLUCONATE 0.12 % MT SOLN: 15.0000 mL | Freq: Two times a day (BID) | OROMUCOSAL | Status: DC

## 2015-09-13 MED ORDER — ONDANSETRON 4 MG PO TBDP: 4.0000 mg | ORAL_TABLET | Freq: Four times a day (QID) | ORAL | Status: DC | PRN

## 2015-09-13 MED ORDER — BIOTENE DRY MOUTH MT LIQD: 15.0000 mL | OROMUCOSAL | Status: DC | PRN

## 2015-09-13 MED ORDER — ONDANSETRON HCL 4 MG PO TABS: 4.0000 mg | ORAL_TABLET | Freq: Four times a day (QID) | ORAL | Status: DC | PRN

## 2015-09-13 MED ORDER — THEOPHYLLINE 80 MG/15ML PO ELIX
130.0000 mg | ORAL_SOLUTION | Freq: Three times a day (TID) | ORAL | Status: DC
  Administered 2015-09-13 – 2015-09-17 (×9): 130 mg via ORAL
  Filled 2015-09-13 (×19): qty 24.4

## 2015-09-13 MED ORDER — THEOPHYLLINE 80 MG/15ML PO ELIX: 130.0000 mg | ORAL_SOLUTION | Freq: Three times a day (TID) | ORAL | Status: DC

## 2015-09-13 MED ORDER — SODIUM CHLORIDE 0.9 % IV SOLN
500.0000 mg | Freq: Two times a day (BID) | INTRAVENOUS | Status: DC
  Administered 2015-09-14: 500 mg via INTRAVENOUS
  Filled 2015-09-13 (×8): qty 5

## 2015-09-13 MED ORDER — GLYCOPYRROLATE 1 MG PO TABS
1.0000 mg | ORAL_TABLET | ORAL | Status: DC | PRN
  Filled 2015-09-13: qty 1

## 2015-09-13 MED ORDER — HALOPERIDOL LACTATE 5 MG/ML IJ SOLN: 0.5000 mg | INTRAMUSCULAR | Status: DC | PRN

## 2015-09-13 MED ORDER — SODIUM CHLORIDE 0.9 % IV SOLN: 500.0000 mg | Freq: Two times a day (BID) | INTRAVENOUS | Status: DC

## 2015-09-13 MED ORDER — POLYVINYL ALCOHOL 1.4 % OP SOLN: 1.0000 [drp] | Freq: Four times a day (QID) | OPHTHALMIC | Status: DC | PRN

## 2015-09-13 MED ORDER — LORAZEPAM 1 MG PO TABS
1.0000 mg | ORAL_TABLET | Freq: Three times a day (TID) | ORAL | Status: DC
  Administered 2015-09-13 – 2015-09-17 (×11): 1 mg via ORAL
  Filled 2015-09-13 (×11): qty 1

## 2015-09-13 MED ORDER — LORAZEPAM 2 MG/ML IJ SOLN: 1.0000 mg | INTRAMUSCULAR | Status: DC | PRN

## 2015-09-13 MED ORDER — CHLORHEXIDINE GLUCONATE 0.12 % MT SOLN
15.0000 mL | Freq: Two times a day (BID) | OROMUCOSAL | Status: DC
  Administered 2015-09-14 – 2015-09-17 (×7): 15 mL via OROMUCOSAL
  Filled 2015-09-13 (×7): qty 15

## 2015-09-13 MED ORDER — HALOPERIDOL 0.5 MG PO TABS
0.5000 mg | ORAL_TABLET | ORAL | Status: DC | PRN
  Filled 2015-09-13: qty 1

## 2015-09-13 NOTE — Discharge Summary (Signed)
Physician Discharge Summary  [name] [id number] DOB: Mar 30, 1913 DOA: 09/07/2015  PCP: [name], MD  Admit date: 09/07/2015 Discharge date: 09/13/2015  Time spent: 20 minutes  D/C to GIP   Discharge Diagnoses:  Active Problems:   Diabetes ([place])   COPD (chronic obstructive pulmonary disease) (HCC)   Urinary retention   Acute encephalopathy   Dementia   Sepsis ([place])   Urinary tract infectious disease   Palliative care encounter   Discharge Condition: Stable  Diet recommendation: Dysphagia 3  Filed Weights   09/07/15 1621 09/08/15 0400 09/09/15 0500  Weight: 47.6 kg (104 lb 15 oz) 48.3 kg (106 lb 7.7 oz) 50.1 kg (110 lb 7.2 oz)    History of present illness:  Please review dictated H and P from 4/21 for details. Briefly, 82 yof with a hx of urinary retention, PUD, COPD, DM type 2, GERD, HTN, hypothyroidism, and dementia presented with worsening mental status and emesis. Patient has severe dementia at baseline. Since she initially had fever, hypotension and possible UTI, there was concern for developing sepsis. She received abx for a UTI, but these were discontinued when urine culture showed non specific growth. Additionally, while in the ED, a 3 minute Sz was witnessed by the nursing staff. She was given ativan with resolution of the sz, but remained obtunded throughout ED. This was likely precipitated by fever, but she has not had any recurrence of fever/seizure. Overall she has stabilized, but remains altered and is refusing to eat reliably and/or take medications.   Hospital Course:    Sepsis. Likely from UTI.   Resolved.   Lactic acid wnl s/p after adequate hydration.   Antibiotics have been discontinued since blood cultures have shown no growth.  Urine culture showed non specific growth.   Seizure disorder.   Patient witnessed to have a 3 minute seizure in the ED, likely precipitated by fevers, and was treated with Ativan.   Will  continue Ativan PRN.   She has not had any further seizures since coming to the hospital.   Continued Sz precautions and Keppra.  Acute encephalopathy.   Superimposed on base line dementia.   Likely related to presenting sepsis and UTI.   Possible that with advanced dementia and acute illness, that this may be her new baseline.   Acute kidney injury.   Possibly related to dehydration and hypotension.   Improving with IV hydration. Pt has a hx of chronic urinary retention.   Foley cath remains in place, will continue Flomax, and monitor urine output.   DM type 2, diet controlled.   Continue SSI.   Pt was having episodes of hypoglycemia and required dextrose infusion. Dextrose has since been discontinued and she seems to be maintaining her blood sugars.   She is also on a diet, but often refuses to eat/take meds.   COPD/ Asthma.   Stable thus far.   Cont albuterol PRN.  GERD.   Continue PPI as tolerated  Hypothyroidism.   TSH wnl.   Continue synthroid as tolerated  Dementia.   Noted to be severe at baseline.   PO intake has been variable but chart indicates that she will eat around 25% of meals at best.   Pt has been noted to refuse medications and at times refused to open her mouth. Palliative Care was consulted. Recs for full comfort care, to be discharged to [facility] today  Procedures:    Consultations:  Palliative Care  Discharge Exam: Filed Vitals:   09/12/15  0802 09/12/15 0937 09/12/15 1331 09/13/15 0603  BP: 165/61 162/54 158/69 154/72  Pulse: 77 73 76 81  Temp:   98 F (36.7 C) 98 F (36.7 C)  TempSrc:   Oral Oral  Resp:   18 20  Height:      Weight:      SpO2:   98% 96%    General: Awake, in nad Cardiovascular: regular, s1, s2 Respiratory: normal resp effort, no wheezing  Discharge Instructions     Medication List    ASK your doctor about these medications        acetaminophen 325 MG tablet  Commonly  known as:  TYLENOL  Take 2 tablets (650 mg total) by mouth every 6 (six) hours as needed for mild pain (or Fever >/= 101).     amoxicillin-clavulanate 500-125 MG tablet  Commonly known as:  AUGMENTIN  Take 1 tablet (500 mg total) by mouth 3 (three) times daily.     aspirin 81 MG tablet  Take 81 mg by mouth daily.     CITRACAL PETITES/VITAMIN D 200-250 MG-UNIT Tabs  Generic drug:  Calcium Citrate-Vitamin D  Take 1 tablet by mouth daily.     EARWAX TREATMENT DROPS 6.5 % otic solution  Generic drug:  carbamide peroxide  Place 5 drops into both ears 2 (two) times daily as needed (build up).     levETIRAcetam 250 MG tablet  Commonly known as:  KEPPRA  Take 1 tablet (250 mg total) by mouth daily.     levothyroxine 25 MCG tablet  Commonly known as:  SYNTHROID, LEVOTHROID  Take 25 mcg by mouth daily before breakfast.     montelukast 10 MG tablet  Commonly known as:  SINGULAIR  Take 10 mg by mouth every morning.     polyethylene glycol packet  Commonly known as:  MIRALAX / GLYCOLAX  Take 17 g by mouth daily as needed for mild constipation.     solifenacin 5 MG tablet  Commonly known as:  VESICARE  Take 5 mg by mouth daily.     tamsulosin 0.4 MG Caps capsule  Commonly known as:  FLOMAX  Take 1 capsule (0.4 mg total) by mouth daily.     theophylline 400 MG 24 hr tablet  Commonly known as:  UNIPHYL  Take 400 mg by mouth daily.       No Known Allergies    The results of significant diagnostics from this hospitalization (including imaging, microbiology, ancillary and laboratory) are listed below for reference.    Significant Diagnostic Studies: Ct Head Wo Contrast  09/07/2015  CLINICAL DATA:  Status post seizure today. Vomiting. No known injury. Subsequent encounter. EXAM: CT HEAD WITHOUT CONTRAST TECHNIQUE: Contiguous axial images were obtained from the base of the skull through the vertex without intravenous contrast. COMPARISON:  Head CT scan 07/11/2015 and 04/26/2015.  FINDINGS: Cortical atrophy and chronic microvascular ischemic change are again seen. No evidence of acute intracranial abnormality including hemorrhage, infarct, mass lesion, mass effect, midline shift or abnormal extra-axial fluid collection. No hydrocephalus or pneumocephalus. The calvarium is intact. Imaged paranasal sinuses and mastoid air cells are clear. The patient is status post bilateral lens extraction. IMPRESSION: No acute abnormality. Atrophy and chronic microvascular ischemic change. Electronically Signed   By: Inge Rise M.D.   On: 09/07/2015 14:48   Dg Chest Port 1 View  09/07/2015  CLINICAL DATA:  One 82 year old female with a history of vomiting EXAM: PORTABLE CHEST 1 VIEW COMPARISON:  07/26/2015,  07/11/2015 FINDINGS: Cardiomediastinal silhouette is unchanged. Atherosclerotic calcification of the aortic arch. No evidence of pulmonary vascular congestion. No pneumothorax. Coarsened interstitial markings bilaterally without confluent airspace disease. No large pleural effusion. No displaced fracture. Surgical changes of the cervical region. IMPRESSION: Chronic lung changes without evidence of acute cardiopulmonary disease. Signed, Dulcy Fanny. Earleen Newport, DO [facility] Electronically Signed   By: Corrie Mckusick D.O.   On: 09/07/2015 12:34    Microbiology: Recent Results (from the past 240 hour(s))  Urine culture     Status: Abnormal   Collection Time: 09/07/15 11:33 AM  Result Value Ref Range Status   Specimen Description URINE, CATHETERIZED  Final   Special Requests NONE  Final   Culture (A)  Final    6,000 COLONIES/mL INSIGNIFICANT GROWTH Performed at [facility]    Report Status 09/10/2015 FINAL  Final  Blood Culture (routine x 2)     Status: None   Collection Time: 09/07/15 12:38 PM  Result Value Ref Range Status   Specimen Description BLOOD RIGHT HAND  Final   Special Requests BOTTLES DRAWN AEROBIC ONLY 7  CC  Final   Culture NO GROWTH 5 DAYS  Final   Report Status 09/12/2015 FINAL  Final  Blood Culture (routine x 2)     Status: None   Collection Time: 09/07/15 12:39 PM  Result Value Ref Range Status   Specimen Description BLOOD LEFT HAND  Final   Special Requests BOTTLES DRAWN AEROBIC ONLY 7 CC  Final   Culture NO GROWTH 5 DAYS  Final   Report Status 09/12/2015 FINAL  Final  MRSA PCR Screening     Status: None   Collection Time: 09/07/15  4:30 PM  Result Value Ref Range Status   MRSA by PCR NEGATIVE NEGATIVE Final    Comment:        The GeneXpert MRSA Assay (FDA approved for NASAL specimens only), is one component of a comprehensive MRSA colonization surveillance program. It is not intended to diagnose MRSA infection nor to guide or monitor treatment for MRSA infections.      Labs: Basic Metabolic Panel:  Recent Labs Lab 09/07/15 1321 09/08/15 0656 09/09/15 0455  NA 146* 144 144  K 3.9 4.6 4.2  CL 108 112* 115*  CO2 28 24 21*  GLUCOSE 133* 80 89  BUN 29* 31* 22*  CREATININE 1.08* 1.38* 1.14*  CALCIUM 9.3 8.4* 8.2*   Liver Function Tests:  Recent Labs Lab 09/07/15 1321 09/08/15 0656  AST 24 19  ALT 15 12*  ALKPHOS 55 48  BILITOT 0.4 0.4  PROT 6.5 6.1*  ALBUMIN 3.5 3.0*   No results for input(s): LIPASE, AMYLASE in the last 168 hours. No results for input(s): AMMONIA in the last 168 hours. CBC:  Recent Labs Lab 09/07/15 1222 09/08/15 0656 09/09/15 0455 09/09/15 0834  WBC 17.9* 14.2* 8.8 10.3  NEUTROABS 13.8*  --   --   --   HGB 12.6 11.1* 10.8* 11.5*  HCT 38.9 33.1* 32.9* 34.3*  MCV 98.2 95.9 97.9 96.3  PLT 198 160 33* 160   Cardiac Enzymes:  Recent Labs Lab 09/07/15 1321  CKTOTAL 74   BNP: BNP (last 3 results) No results for input(s): BNP in the last 8760 hours.  ProBNP (last 3 results) No results for input(s): PROBNP in the last 8760 hours.  CBG:  Recent Labs Lab 09/11/15 2345 09/12/15 0338 09/12/15 0708 09/12/15 1102  09/12/15 1610  GLUCAP 121* 104* 99  163* 102*    Signed:  CHIU, STEPHEN K  [facility] 09/13/2015, 1:29 PM

## 2015-09-13 NOTE — Care Management Note (Signed)
Case Management Note  Patient Details  Name: ARLAYNE HASSELMAN MRN: [id number] Date of Birth: 06-02-1912  Expected Discharge Date:    09/13/2015              Expected Discharge Plan:  [place]  In-House Referral:  Clinical Social Work, Hospice / Palliative Care  Discharge planning Services  CM Consult  Post Acute Care Choice:  NA Choice offered to:  NA  DME Arranged:    DME Agency:     HH Arranged:    [place] Agency:     Status of Service:  Completed, signed off  Medicare Important Message Given:  Yes Date Medicare IM Given:    Medicare IM give by:    Date Additional Medicare IM Given:    Additional Medicare Important Message give by:     If discussed at [place] of Stay Meetings, dates discussed:  09/13/2015  Additional Comments: Pt being made hospice GIP today. No further needs. Will transfer to hospice facility when bed available.  Sherald Barge, RN 09/13/2015, 11:20 AM

## 2015-09-13 NOTE — Clinical Social Work Note (Signed)
Paperwork has been completed by DSS for GIP. No beds at [facility] today. MD and registration notified.   [name], [place]

## 2015-09-13 NOTE — H&P (Signed)
Patient discharged to GIP status. See discharge summary for details.

## 2015-09-14 DIAGNOSIS — G9340 Encephalopathy, unspecified: Secondary | ICD-10-CM

## 2015-09-14 DIAGNOSIS — I10 Essential (primary) hypertension: Secondary | ICD-10-CM

## 2015-09-14 MED ORDER — LEVETIRACETAM IN NACL 500 MG/100ML IV SOLN
INTRAVENOUS | Status: AC
  Filled 2015-09-14: qty 100

## 2015-09-14 MED ORDER — LEVETIRACETAM IN NACL 500 MG/100ML IV SOLN
500.0000 mg | Freq: Two times a day (BID) | INTRAVENOUS | Status: DC
  Filled 2015-09-14 (×7): qty 100

## 2015-09-14 NOTE — Progress Notes (Signed)
SN visit made.  Pt lying in bed in no apparent distress.  Alert to self.  Family in room.  VS: BP 174/82, 75, 20, 97.5, 98% RA.  Lungs diminished bilaterally.  Abdomen flat, soft and nontender with hypoactive BS x 4.  Appetite: Poor.  LBM 09/14/15.  Skin: Appropriate for ethnicity.  Reveiwed health status and medications with Elwin Sleight, RN and Katharine Look, RN.  Sharion Settler, RN and Katharine Look, RN on the transfer to hospice home process and when to call hospice.  Instructed on 24/7 hospice availability.  Elwin Sleight, RN and Katharine Look, RN verbalized understanding.  Will continue to monitor.

## 2015-09-14 NOTE — Clinical Social Work Note (Signed)
CSW spoke with [facility] and no beds at this time. Hospice will call if bed is available this weekend for transfer.  [name], [place]

## 2015-09-14 NOTE — Progress Notes (Signed)
PROGRESS NOTE    [name]  [id number] DOB: 05-May-1913 DOA: 09/13/2015 PCP: [name], MD  Outpatient Specialists:    Brief Narrative:  69 yof with a hx of urinary retention, PUD, COPD, DM type 2, GERD, HTN, hypothyroidism, and dementia presented with worsening mental status and emesis. Patient has severe dementia at baseline. Since she initially had fever, hypotension and possible UTI, there was concern for developing sepsis. She received abx for a UTI, but these were discontinued when urine culture showed non specific growth. Additionally, while in the ED, a 3 minute Sz was witnessed by the nursing staff. She was given ativan with resolution of the sz, but remained obtunded throughout ED. This was likely precipitated by fever, but she has not had any recurrence of fever/seizure. Overall she has stabilized, but remains altered and is refusing to eat reliably and/or take medications.    Assessment & Plan:   Active Problems:   Essential hypertension   COPD (chronic obstructive pulmonary disease) (HCC)   Acute encephalopathy   Dementia   Sepsis. Likely from UTI.   Resolved.   Lactic acid wnl s/p after adequate hydration.   Antibiotics have been discontinued since blood cultures have shown no growth.  Urine culture showed non specific growth.   Seizure disorder.   Patient witnessed to have a 3 minute seizure in the ED, likely precipitated by fevers, and was treated with Ativan.   Will continue Ativan PRN.   She has not had any further seizures since coming to the hospital.   Continue Sz precautions and Keppra.  Acute encephalopathy.   Superimposed on base line dementia.   Likely related to presenting sepsis and UTI.   Possible that with advanced dementia and acute illness, that this may be her new baseline.   Acute kidney injury.   Possibly related to dehydration and hypotension.   Improving with IV hydration. Pt has a hx of chronic  urinary retention.   Foley cath remains in place, continue Flomax, and monitor urine output.   DM type 2, diet controlled.   Continue SSI.   Pt was having episodes of hypoglycemia and required dextrose infusion. Dextrose has since been discontinued and she seems to be maintaining her blood sugars.   She is also on a diet, but often refuses to eat/take meds.   COPD/ Asthma.   Stable thus far.   Cont albuterol PRN.  GERD.   Continue PPI as tolerated  Hypothyroidism.   TSH wnl.   Continue synthroid as tolerated  Dementia.   Noted to be severe at baseline.   PO intake has been variable but chart indicates that she will eat around 25% of meals at best.   Currently on GIP status. Awaiting transfer to hospice when bed available   DVT prophylaxis: Full comfort care Code Status: DNR Family Communication: Pt in room Disposition Plan: Hospice pending   Consultants:   Palliative Care  Procedures:    Antimicrobials:   Cefepime 4/21 >>4/24    Subjective: No complaints this AM  Objective: Filed Vitals:   09/13/15 1351 09/13/15 1926 09/14/15 0636  BP: 154/64  174/82  Pulse: 74  75  Temp: 98.3 F (36.8 C)  97.5 F (36.4 C)  TempSrc: Oral  Oral  Resp: 18  20  SpO2: 97% 94% 98%    Intake/Output Summary (Last 24 hours) at 09/14/15 1300 Last data filed at 09/14/15 1220  Gross per 24 hour  Intake    360 ml  Output  700 ml  Net   -340 ml   There were no vitals filed for this visit.  Examination:  General exam: Appears calm and comfortable, laying in bed Respiratory system: Clear to auscultation. Respiratory effort normal. Cardiovascular system: S1 & S2 heard, RRR.  Gastrointestinal system: Abdomen is nondistended, soft and non-tender. Normal bowel sounds heard. Central nervous system: Alert . No focal neurological deficits. Extremities: Symmetric 5 x 5 power. Skin: No rashes, lesions Psychiatry: . Mood & affect appropriate., no auditory/visual  hallucinations    Data Reviewed: I have personally reviewed following labs and imaging studies  CBC:  Recent Labs Lab 09/08/15 0656 09/09/15 0455 09/09/15 0834  WBC 14.2* 8.8 10.3  HGB 11.1* 10.8* 11.5*  HCT 33.1* 32.9* 34.3*  MCV 95.9 97.9 96.3  PLT 160 33* 0000000   Basic Metabolic Panel:  Recent Labs Lab 09/07/15 1321 09/08/15 0656 09/09/15 0455  NA 146* 144 144  K 3.9 4.6 4.2  CL 108 112* 115*  CO2 28 24 21*  GLUCOSE 133* 80 89  BUN 29* 31* 22*  CREATININE 1.08* 1.38* 1.14*  CALCIUM 9.3 8.4* 8.2*   GFR: Estimated Creatinine Clearance: 20.8 mL/min (by C-G formula based on Cr of 1.14). Liver Function Tests:  Recent Labs Lab 09/07/15 1321 09/08/15 0656  AST 24 19  ALT 15 12*  ALKPHOS 55 48  BILITOT 0.4 0.4  PROT 6.5 6.1*  ALBUMIN 3.5 3.0*   No results for input(s): LIPASE, AMYLASE in the last 168 hours. No results for input(s): AMMONIA in the last 168 hours. Coagulation Profile:  Recent Labs Lab 09/07/15 1321  INR 1.13   Cardiac Enzymes:  Recent Labs Lab 09/07/15 1321  CKTOTAL 74   BNP (last 3 results) No results for input(s): PROBNP in the last 8760 hours. HbA1C: No results for input(s): HGBA1C in the last 72 hours. CBG:  Recent Labs Lab 09/11/15 2345 09/12/15 0338 09/12/15 0708 09/12/15 1102 09/12/15 1610  GLUCAP 121* 104* 99 163* 102*   Lipid Profile: No results for input(s): CHOL, HDL, LDLCALC, TRIG, CHOLHDL, LDLDIRECT in the last 72 hours. Thyroid Function Tests: No results for input(s): TSH, T4TOTAL, FREET4, T3FREE, THYROIDAB in the last 72 hours. Anemia Panel: No results for input(s): VITAMINB12, FOLATE, FERRITIN, TIBC, IRON, RETICCTPCT in the last 72 hours. Urine analysis:    Component Value Date/Time   COLORURINE YELLOW 09/07/2015 1133   APPEARANCEUR CLEAR 09/07/2015 1133   LABSPEC 1.010 09/07/2015 1133   PHURINE 6.0 09/07/2015 1133   GLUCOSEU NEGATIVE 09/07/2015 1133   HGBUR TRACE* 09/07/2015 1133   BILIRUBINUR  NEGATIVE 09/07/2015 1133   KETONESUR NEGATIVE 09/07/2015 1133   PROTEINUR TRACE* 09/07/2015 1133   UROBILINOGEN 0.2 10/27/2014 1618   NITRITE POSITIVE* 09/07/2015 1133   LEUKOCYTESUR TRACE* 09/07/2015 1133   Sepsis Labs: @[email or web address] ([id number])  ) Recent Results (from the past 240 hour(s))  Urine culture     Status: Abnormal   Collection Time: 09/07/15 11:33 AM  Result Value Ref Range Status   Specimen Description URINE, CATHETERIZED  Final   Special Requests NONE  Final   Culture (A)  Final    6,000 COLONIES/mL INSIGNIFICANT GROWTH Performed at [facility]    Report Status 09/10/2015 FINAL  Final  Blood Culture (routine x 2)     Status: None   Collection Time: 09/07/15 12:38 PM  Result Value Ref Range Status   Specimen Description BLOOD RIGHT HAND  Final   Special Requests BOTTLES DRAWN AEROBIC ONLY 7 CC  Final  Culture NO GROWTH 5 DAYS  Final   Report Status 09/12/2015 FINAL  Final  Blood Culture (routine x 2)     Status: None   Collection Time: 09/07/15 12:39 PM  Result Value Ref Range Status   Specimen Description BLOOD LEFT HAND  Final   Special Requests BOTTLES DRAWN AEROBIC ONLY 7 CC  Final   Culture NO GROWTH 5 DAYS  Final   Report Status 09/12/2015 FINAL  Final  MRSA PCR Screening     Status: None   Collection Time: 09/07/15  4:30 PM  Result Value Ref Range Status   MRSA by PCR NEGATIVE NEGATIVE Final    Comment:        The GeneXpert MRSA Assay (FDA approved for NASAL specimens only), is one component of a comprehensive MRSA colonization surveillance program. It is not intended to diagnose MRSA infection nor to guide or monitor treatment for MRSA infections.          Radiology Studies: No results found.    Scheduled Meds: . chlorhexidine  15 mL Mouth Rinse BID  . levETIRAcetam  500 mg Intravenous Q12H  . LORazepam  1 mg Oral TID  . theophylline  130 mg Oral Q8H   Continuous Infusions:    LOS: 1 day    Kiran Lapine,  Orpah Melter, MD [facility] Pager [phone]  If 7PM-7AM, please contact night-coverage [email or web address] Password [email or web address] 09/14/2015, 1:00 PM

## 2015-09-15 DIAGNOSIS — F0390 Unspecified dementia without behavioral disturbance: Secondary | ICD-10-CM

## 2015-09-15 MED ORDER — LEVETIRACETAM 100 MG/ML PO SOLN
500.0000 mg | Freq: Two times a day (BID) | ORAL | Status: DC
  Administered 2015-09-15 – 2015-09-17 (×5): 500 mg via ORAL
  Filled 2015-09-15 (×9): qty 5

## 2015-09-15 NOTE — Progress Notes (Signed)
PROGRESS NOTE    [name]  [id number] DOB: 1913-04-20 DOA: 09/13/2015 PCP: [name], MD  Outpatient Specialists:    Brief Narrative:  89 yof with a hx of urinary retention, PUD, COPD, DM type 2, GERD, HTN, hypothyroidism, and dementia presented with worsening mental status and emesis. Patient has severe dementia at baseline. Since she initially had fever, hypotension and possible UTI, there was concern for developing sepsis. She received abx for a UTI, but these were discontinued when urine culture showed non specific growth. Additionally, while in the ED, a 3 minute Sz was witnessed by the nursing staff. She was given ativan with resolution of the sz, but remained obtunded throughout ED. This was likely precipitated by fever, but she has not had any recurrence of fever/seizure. Overall she has stabilized, but remains altered and is refusing to eat reliably and/or take medications.    Assessment & Plan:   Active Problems:   Essential hypertension   COPD (chronic obstructive pulmonary disease) (HCC)   Acute encephalopathy   Dementia   Sepsis. Likely from UTI.   Sepsis pathology has esolved.   Lactic acid wnl s/p after adequate hydration.   Antibiotics have been discontinued since blood cultures have shown no growth.  Urine culture showed non specific growth.   Seizure disorder.   Patient witnessed to have a 3 minute seizure in the ED, likely precipitated by fevers, and was treated with Ativan.   Will continue Ativan PRN.   She has not had any further seizures since coming to the hospital.   Continue Sz precautions and Keppra PO.  Acute encephalopathy.   Superimposed on base line dementia.   Likely related to presenting sepsis and UTI.   Possible that with advanced dementia and acute illness, that this may be her new baseline.   Acute kidney injury.   Possibly related to dehydration and hypotension.   Improving with IV hydration. Pt has  a hx of chronic urinary retention.   Foley cath remains in place, continue Flomax, and monitor urine output.   DM type 2, diet controlled.   Continue SSI.   Pt was having episodes of hypoglycemia and required dextrose infusion. Dextrose has since been discontinued and she seems to be maintaining her blood sugars.   She is also on a diet, but often refuses to eat/take meds.   COPD/ Asthma.   Stable thus far.   Cont albuterol PRN.  GERD.   Continue PPI as tolerated  Hypothyroidism.   TSH wnl.   Continue synthroid as tolerated  Dementia.   Noted to be severe at baseline.   PO intake has been variable but chart indicates that she will eat around 25% of meals at best.   Currently on GIP status. Awaiting transfer to hospice when bed available  Per Palliative Care, prognosis is "< 4 weeks, based on poor PO intake, advanced dementia, and families desire to focus on comfort only"   DVT prophylaxis: Full comfort care Code Status: DNR Family Communication: Pt in room Disposition Plan: Hospice pending   Consultants:   Palliative Care  Procedures:    Antimicrobials:   Cefepime 4/21 >>4/24    Subjective: Without complaints  Objective: Filed Vitals:   09/14/15 1930 09/14/15 2139 09/15/15 0204 09/15/15 0650  BP:  226/90 197/72 172/72  Pulse:  81  75  Temp:  98.1 F (36.7 C)  98.1 F (36.7 C)  TempSrc:  Oral  Oral  Resp:  20  20  SpO2: 93%  100%  98%    Intake/Output Summary (Last 24 hours) at 09/15/15 1508 Last data filed at 09/15/15 1233  Gross per 24 hour  Intake  61380 ml  Output   2100 ml  Net  59280 ml   There were no vitals filed for this visit.  Examination:  General exam: Appears calm and comfortable, laying in bed, awake Respiratory system: Clear to auscultation. Respiratory effort normal. Cardiovascular system: S1 & S2 heard, RRR.  Gastrointestinal system: Abdomen is nondistended, soft and non-tender. Normal bowel sounds  heard. Central nervous system: Alert . No focal neurological deficits. Extremities: Symmetric 5 x 5 power. Skin: No rashes, lesions Psychiatry: . Mood & affect appropriate., no auditory/visual hallucinations    Data Reviewed: I have personally reviewed following labs and imaging studies  CBC:  Recent Labs Lab 09/09/15 0455 09/09/15 0834  WBC 8.8 10.3  HGB 10.8* 11.5*  HCT 32.9* 34.3*  MCV 97.9 96.3  PLT 33* 0000000   Basic Metabolic Panel:  Recent Labs Lab 09/09/15 0455  NA 144  K 4.2  CL 115*  CO2 21*  GLUCOSE 89  BUN 22*  CREATININE 1.14*  CALCIUM 8.2*   GFR: Estimated Creatinine Clearance: 20.8 mL/min (by C-G formula based on Cr of 1.14). Liver Function Tests: No results for input(s): AST, ALT, ALKPHOS, BILITOT, PROT, ALBUMIN in the last 168 hours. No results for input(s): LIPASE, AMYLASE in the last 168 hours. No results for input(s): AMMONIA in the last 168 hours. Coagulation Profile: No results for input(s): INR, PROTIME in the last 168 hours. Cardiac Enzymes: No results for input(s): CKTOTAL, CKMB, CKMBINDEX, TROPONINI in the last 168 hours. BNP (last 3 results) No results for input(s): PROBNP in the last 8760 hours. HbA1C: No results for input(s): HGBA1C in the last 72 hours. CBG:  Recent Labs Lab 09/11/15 2345 09/12/15 0338 09/12/15 0708 09/12/15 1102 09/12/15 1610  GLUCAP 121* 104* 99 163* 102*   Lipid Profile: No results for input(s): CHOL, HDL, LDLCALC, TRIG, CHOLHDL, LDLDIRECT in the last 72 hours. Thyroid Function Tests: No results for input(s): TSH, T4TOTAL, FREET4, T3FREE, THYROIDAB in the last 72 hours. Anemia Panel: No results for input(s): VITAMINB12, FOLATE, FERRITIN, TIBC, IRON, RETICCTPCT in the last 72 hours. Urine analysis:    Component Value Date/Time   COLORURINE YELLOW 09/07/2015 1133   APPEARANCEUR CLEAR 09/07/2015 1133   LABSPEC 1.010 09/07/2015 1133   PHURINE 6.0 09/07/2015 1133   GLUCOSEU NEGATIVE 09/07/2015 1133    HGBUR TRACE* 09/07/2015 1133   [place] 09/07/2015 1133   KETONESUR NEGATIVE 09/07/2015 1133   PROTEINUR TRACE* 09/07/2015 1133   UROBILINOGEN 0.2 10/27/2014 1618   NITRITE POSITIVE* 09/07/2015 1133   LEUKOCYTESUR TRACE* 09/07/2015 1133   Sepsis Labs: @LABRCNTIP (procalcitonin:4,lacticidven:4)  ) Recent Results (from the past 240 hour(s))  Urine culture     Status: Abnormal   Collection Time: 09/07/15 11:33 AM  Result Value Ref Range Status   Specimen Description URINE, CATHETERIZED  Final   Special Requests NONE  Final   Culture (A)  Final    6,000 COLONIES/mL INSIGNIFICANT GROWTH Performed at [facility]    Report Status 09/10/2015 FINAL  Final  Blood Culture (routine x 2)     Status: None   Collection Time: 09/07/15 12:38 PM  Result Value Ref Range Status   Specimen Description BLOOD RIGHT HAND  Final   Special Requests BOTTLES DRAWN AEROBIC ONLY 7 CC  Final   Culture NO GROWTH 5 DAYS  Final   Report  Status 09/12/2015 FINAL  Final  Blood Culture (routine x 2)     Status: None   Collection Time: 09/07/15 12:39 PM  Result Value Ref Range Status   Specimen Description BLOOD LEFT HAND  Final   Special Requests BOTTLES DRAWN AEROBIC ONLY 7 CC  Final   Culture NO GROWTH 5 DAYS  Final   Report Status 09/12/2015 FINAL  Final  MRSA PCR Screening     Status: None   Collection Time: 09/07/15  4:30 PM  Result Value Ref Range Status   MRSA by PCR NEGATIVE NEGATIVE Final    Comment:        The GeneXpert MRSA Assay (FDA approved for NASAL specimens only), is one component of a comprehensive MRSA colonization surveillance program. It is not intended to diagnose MRSA infection nor to guide or monitor treatment for MRSA infections.          Radiology Studies: No results found.    Scheduled Meds: . chlorhexidine  15 mL Mouth Rinse BID  . levETIRAcetam  500 mg Oral BID  . LORazepam  1 mg Oral TID  . theophylline  130 mg Oral Q8H   Continuous  Infusions:    LOS: 2 days    CHIU, Orpah Melter, MD [facility] Pager [phone]  If 7PM-7AM, please contact night-coverage [email or web address] Password [email or web address] 09/15/2015, 3:08 PM

## 2015-09-16 NOTE — Progress Notes (Signed)
PROGRESS NOTE    [name]  [id number] DOB: Feb 12, 1913 DOA: 09/13/2015 PCP: [name], MD  Outpatient Specialists:    Brief Narrative:  53 yof with a hx of urinary retention, PUD, COPD, DM type 2, GERD, HTN, hypothyroidism, and dementia presented with worsening mental status and emesis. Patient has severe dementia at baseline. Since she initially had fever, hypotension and possible UTI, there was concern for developing sepsis. She received abx for a UTI, but these were discontinued when urine culture showed non specific growth. Additionally, while in the ED, a 3 minute Sz was witnessed by the nursing staff. She was given ativan with resolution of the sz, but remained obtunded throughout ED. This was likely precipitated by fever, but she has not had any recurrence of fever/seizure. Overall she has stabilized, but remains altered and is refusing to eat reliably and/or take medications.    Assessment & Plan:   Active Problems:   Essential hypertension   COPD (chronic obstructive pulmonary disease) (HCC)   Acute encephalopathy   Dementia   Sepsis. Likely from UTI.   Sepsis pathology had resolved.   Lactic acid wnl s/p after adequate hydration.   Antibiotics have been discontinued since blood cultures have shown no growth.  Urine culture showed non specific growth.   Seizure disorder.   Patient witnessed to have a 3 minute seizure in the ED, likely precipitated by fevers, and was treated with Ativan.   Will continue Ativan PRN.   She has not had any further seizures since coming to the hospital.   Continue Sz precautions and Keppra PO.  Acute encephalopathy.   Superimposed on base line dementia.   Likely related to presenting sepsis and UTI.   Likely that with advanced dementia and acute illness, that this may be her new baseline.   Acute kidney injury.   Possibly related to dehydration and hypotension.   Improving with IV hydration. Pt has  a hx of chronic urinary retention.   Foley cath remains in place, continue Flomax, and monitor urine output.   DM type 2, diet controlled.   Continue SSI.   Pt was having episodes of hypoglycemia and required dextrose infusion. Dextrose has since been discontinued and she seems to be maintaining her blood sugars.   She is also on a diet, but often refuses to eat/take meds.   COPD/ Asthma.   Stable thus far.   Cont albuterol PRN.  GERD.   Continue PPI as tolerated  Hypothyroidism.   TSH wnl.   Continue synthroid as tolerated  Dementia.   Noted to be severe at baseline.   PO intake has been variable but chart indicates that she will eat around 25% of meals at best.   Currently on GIP status. Awaiting transfer to hospice when bed available  Per Palliative Care, prognosis is "< 4 weeks, based on poor PO intake, advanced dementia, and families desire to focus on comfort only"   DVT prophylaxis: Full comfort care Code Status: DNR Family Communication: Pt in room Disposition Plan: Hospice pending   Consultants:   Palliative Care  Procedures:    Antimicrobials:   Cefepime 4/21 >>4/24    Subjective: No complaints  Objective: Filed Vitals:   09/15/15 2107 09/15/15 2206 09/16/15 0521 09/16/15 1413  BP:  159/65 174/71 164/74  Pulse:  72 72 74  Temp:  97.7 F (36.5 C) 98.2 F (36.8 C) 97.5 F (36.4 C)  TempSrc:  Oral Oral Oral  Resp:  20 20 20  SpO2: 98% 100% 94% 99%    Intake/Output Summary (Last 24 hours) at 09/16/15 1422 Last data filed at 09/16/15 1413  Gross per 24 hour  Intake    300 ml  Output   1350 ml  Net  -1050 ml   There were no vitals filed for this visit.  Examination:  General exam: Appears calm and comfortable, laying in bed Respiratory system: Clear to auscultation. Respiratory effort normal. Cardiovascular system: S1 & S2 heard, RRR.  Gastrointestinal system: Abdomen is nondistended, soft and non-tender. Normal bowel  sounds heard. Central nervous system: Alert . No focal neurological deficits. Extremities: Symmetric 5 x 5 power. Skin: No rashes, lesions Psychiatry: . Mood & affect appropriate., no auditory/visual hallucinations    Data Reviewed: I have personally reviewed following labs and imaging studies  CBC: No results for input(s): WBC, NEUTROABS, HGB, HCT, MCV, PLT in the last 168 hours. Basic Metabolic Panel: No results for input(s): NA, K, CL, CO2, GLUCOSE, BUN, CREATININE, CALCIUM, MG, PHOS in the last 168 hours. GFR: Estimated Creatinine Clearance: 20.8 mL/min (by C-G formula based on Cr of 1.14). Liver Function Tests: No results for input(s): AST, ALT, ALKPHOS, BILITOT, PROT, ALBUMIN in the last 168 hours. No results for input(s): LIPASE, AMYLASE in the last 168 hours. No results for input(s): AMMONIA in the last 168 hours. Coagulation Profile: No results for input(s): INR, PROTIME in the last 168 hours. Cardiac Enzymes: No results for input(s): CKTOTAL, CKMB, CKMBINDEX, TROPONINI in the last 168 hours. BNP (last 3 results) No results for input(s): PROBNP in the last 8760 hours. HbA1C: No results for input(s): HGBA1C in the last 72 hours. CBG:  Recent Labs Lab 09/11/15 2345 09/12/15 0338 09/12/15 0708 09/12/15 1102 09/12/15 1610  GLUCAP 121* 104* 99 163* 102*   Lipid Profile: No results for input(s): CHOL, HDL, LDLCALC, TRIG, CHOLHDL, LDLDIRECT in the last 72 hours. Thyroid Function Tests: No results for input(s): TSH, T4TOTAL, FREET4, T3FREE, THYROIDAB in the last 72 hours. Anemia Panel: No results for input(s): VITAMINB12, FOLATE, FERRITIN, TIBC, IRON, RETICCTPCT in the last 72 hours. Urine analysis:    Component Value Date/Time   COLORURINE YELLOW 09/07/2015 1133   APPEARANCEUR CLEAR 09/07/2015 1133   LABSPEC 1.010 09/07/2015 1133   PHURINE 6.0 09/07/2015 1133   GLUCOSEU NEGATIVE 09/07/2015 1133   HGBUR TRACE* 09/07/2015 1133   [place] 09/07/2015  1133   KETONESUR NEGATIVE 09/07/2015 1133   PROTEINUR TRACE* 09/07/2015 1133   UROBILINOGEN 0.2 10/27/2014 1618   NITRITE POSITIVE* 09/07/2015 1133   LEUKOCYTESUR TRACE* 09/07/2015 1133   Sepsis Labs: @LABRCNTIP (procalcitonin:4,lacticidven:4)  ) Recent Results (from the past 240 hour(s))  Urine culture     Status: Abnormal   Collection Time: 09/07/15 11:33 AM  Result Value Ref Range Status   Specimen Description URINE, CATHETERIZED  Final   Special Requests NONE  Final   Culture (A)  Final    6,000 COLONIES/mL INSIGNIFICANT GROWTH Performed at [facility]    Report Status 09/10/2015 FINAL  Final  Blood Culture (routine x 2)     Status: None   Collection Time: 09/07/15 12:38 PM  Result Value Ref Range Status   Specimen Description BLOOD RIGHT HAND  Final   Special Requests BOTTLES DRAWN AEROBIC ONLY 7 CC  Final   Culture NO GROWTH 5 DAYS  Final   Report Status 09/12/2015 FINAL  Final  Blood Culture (routine x 2)     Status: None   Collection Time: 09/07/15 12:39 PM  Result Value Ref Range Status   Specimen Description BLOOD LEFT HAND  Final   Special Requests BOTTLES DRAWN AEROBIC ONLY 7 CC  Final   Culture NO GROWTH 5 DAYS  Final   Report Status 09/12/2015 FINAL  Final  MRSA PCR Screening     Status: None   Collection Time: 09/07/15  4:30 PM  Result Value Ref Range Status   MRSA by PCR NEGATIVE NEGATIVE Final    Comment:        The GeneXpert MRSA Assay (FDA approved for NASAL specimens only), is one component of a comprehensive MRSA colonization surveillance program. It is not intended to diagnose MRSA infection nor to guide or monitor treatment for MRSA infections.          Radiology Studies: No results found.    Scheduled Meds: . chlorhexidine  15 mL Mouth Rinse BID  . levETIRAcetam  500 mg Oral BID  . LORazepam  1 mg Oral TID  . theophylline  130 mg Oral Q8H   Continuous Infusions:    LOS: 3 days    Adan Beal, Orpah Melter, MD [facility] Pager [phone]  If 7PM-7AM, please contact night-coverage [email or web address] Password [email or web address] 09/16/2015, 2:22 PM

## 2015-09-17 DIAGNOSIS — Z7401 Bed confinement status: Secondary | ICD-10-CM | POA: Diagnosis not present

## 2015-09-17 DIAGNOSIS — N3281 Overactive bladder: Secondary | ICD-10-CM | POA: Diagnosis not present

## 2015-09-17 DIAGNOSIS — K5289 Other specified noninfective gastroenteritis and colitis: Secondary | ICD-10-CM | POA: Diagnosis not present

## 2015-09-17 DIAGNOSIS — R112 Nausea with vomiting, unspecified: Secondary | ICD-10-CM | POA: Diagnosis not present

## 2015-09-17 DIAGNOSIS — R1313 Dysphagia, pharyngeal phase: Secondary | ICD-10-CM | POA: Diagnosis not present

## 2015-09-17 DIAGNOSIS — E039 Hypothyroidism, unspecified: Secondary | ICD-10-CM | POA: Diagnosis not present

## 2015-09-17 DIAGNOSIS — E0843 Diabetes mellitus due to underlying condition with diabetic autonomic (poly)neuropathy: Secondary | ICD-10-CM | POA: Diagnosis not present

## 2015-09-17 DIAGNOSIS — I10 Essential (primary) hypertension: Secondary | ICD-10-CM | POA: Diagnosis not present

## 2015-09-17 DIAGNOSIS — G9340 Encephalopathy, unspecified: Secondary | ICD-10-CM | POA: Diagnosis not present

## 2015-09-17 DIAGNOSIS — A4189 Other specified sepsis: Secondary | ICD-10-CM | POA: Diagnosis not present

## 2015-09-17 DIAGNOSIS — J449 Chronic obstructive pulmonary disease, unspecified: Secondary | ICD-10-CM | POA: Diagnosis not present

## 2015-09-17 DIAGNOSIS — E860 Dehydration: Secondary | ICD-10-CM | POA: Diagnosis not present

## 2015-09-17 DIAGNOSIS — R279 Unspecified lack of coordination: Secondary | ICD-10-CM | POA: Diagnosis not present

## 2015-09-17 DIAGNOSIS — J168 Pneumonia due to other specified infectious organisms: Secondary | ICD-10-CM | POA: Diagnosis not present

## 2015-09-17 DIAGNOSIS — R339 Retention of urine, unspecified: Secondary | ICD-10-CM | POA: Diagnosis not present

## 2015-09-17 DIAGNOSIS — E119 Type 2 diabetes mellitus without complications: Secondary | ICD-10-CM | POA: Diagnosis not present

## 2015-09-17 DIAGNOSIS — K219 Gastro-esophageal reflux disease without esophagitis: Secondary | ICD-10-CM | POA: Diagnosis not present

## 2015-09-17 DIAGNOSIS — F0390 Unspecified dementia without behavioral disturbance: Secondary | ICD-10-CM | POA: Diagnosis not present

## 2015-09-17 DIAGNOSIS — R5081 Fever presenting with conditions classified elsewhere: Secondary | ICD-10-CM | POA: Diagnosis not present

## 2015-09-17 DIAGNOSIS — R1311 Dysphagia, oral phase: Secondary | ICD-10-CM | POA: Diagnosis not present

## 2015-09-17 MED ORDER — CHLORHEXIDINE GLUCONATE 0.12 % MT SOLN: 15.0000 mL | Freq: Two times a day (BID) | OROMUCOSAL | Status: AC

## 2015-09-17 MED ORDER — LEVETIRACETAM 100 MG/ML PO SOLN: 500.0000 mg | Freq: Two times a day (BID) | ORAL | Status: AC

## 2015-09-17 MED ORDER — MORPHINE SULFATE (CONCENTRATE) 10 MG/0.5ML PO SOLN: 2.5000 mg | ORAL | Status: AC | PRN

## 2015-09-17 MED ORDER — GLYCOPYRROLATE 1 MG PO TABS: 1.0000 mg | ORAL_TABLET | ORAL | Status: AC | PRN

## 2015-09-17 MED ORDER — POLYVINYL ALCOHOL 1.4 % OP SOLN: 1.0000 [drp] | Freq: Four times a day (QID) | OPHTHALMIC | Status: AC | PRN

## 2015-09-17 MED ORDER — HYDROCOD POLST-CPM POLST ER 10-8 MG/5ML PO SUER: 2.5000 mL | Freq: Two times a day (BID) | ORAL | Status: AC | PRN

## 2015-09-17 MED ORDER — HALOPERIDOL 0.5 MG PO TABS: 0.5000 mg | ORAL_TABLET | ORAL | Status: AC | PRN

## 2015-09-17 MED ORDER — LORAZEPAM 1 MG PO TABS: 1.0000 mg | ORAL_TABLET | Freq: Three times a day (TID) | ORAL | Status: AC

## 2015-09-17 NOTE — Clinical Social Work Placement (Signed)
   CLINICAL SOCIAL WORK PLACEMENT  NOTE  Date:  09/17/2015  Patient Details  Name: [name] MRN: [id number] Date of Birth: 03-30-1913  Clinical Social Work is seeking post-discharge placement for this patient at the [place] level of care (*CSW will initial, date and re-position this form in  chart as items are completed):  Yes   Patient/family provided with [place] Work Department's list of facilities offering this level of care within the geographic area requested by the patient (or if unable, by the patient's family).  Yes   Patient/family informed of their freedom to choose among providers that offer the needed level of care, that participate in Medicare, Medicaid or managed care program needed by the patient, have an available bed and are willing to accept the patient.  Yes   Patient/family informed of [facility]'s ownership interest in [facility] and [facility], as well as of the fact that they are under no obligation to receive care at these facilities.  PASRR submitted to EDS on 09/17/15     PASRR number received on 09/17/15 (t)     Existing PASRR number confirmed on 09/17/15     FL2 transmitted to all facilities in geographic area requested by pt/family on 09/17/15     FL2 transmitted to all facilities within larger geographic area on 09/17/15     Patient informed that his/her managed care company has contracts with or will negotiate with certain facilities, including the following:        Yes   Patient/family informed of bed offers received.  Patient chooses bed at       Physician recommends and patient chooses bed at      Patient to be transferred to   on 09/17/15.  Patient to be transferred to facility by       Patient family notified on   of transfer.  Name of family member notified:        PHYSICIAN       Additional Comment:    _______________________________________________ [name],  LCSW 09/17/2015, 1:09 PM

## 2015-09-17 NOTE — Progress Notes (Signed)
Discharged PT per MD order and protocol. Discharge handouts and prescriptions in packet. Called report to [place] at [facility] in [place]. No IV access to D/C. Foley was ordered to be left in. Pt taken out by EMS. Oswald Hillock, RN

## 2015-09-17 NOTE — Clinical Social Work Note (Signed)
Hospice visited pt this morning and pt does not meet GIP criteria at this time. CSW discussed with guardian, Rip Harbour who requests CSW initiate SNF bed search. PT evaluated pt and recommend SNF. Rip Harbour accepts bed at [facility]. Will transport via [employer].  [name], [place]

## 2015-09-17 NOTE — Evaluation (Addendum)
Physical Therapy Evaluation Patient Details Name: [name] MRN: [id number] DOB: Apr 02, 1913 Today's Date: 09/17/2015   History of Present Illness  81 y.o. female with medical history significant of urinary retention, PUD, COPD, DM, GERD, HTN, pancreatitis, hypothyroidism, MGUS, CVA, severe dementia, protein calorie malnutrition, presenting w/ worsening mental status and emesis.   During admission, pt was witnessed to have 2 seizures in the ED.    Clinical Impression  Pt received in bed, and was agreeable to PT evaluation.  Pt is minimally verbal, but attempts to answer questions asked today.  Pt has baseline dementia, but was following commands during evaluation.  Pt required Max A for supine<>sit, and then Mod A for static sitting balance due to L and anterior lean which she was not able to correct.  Pt returned back into the bed due to fatigue.  Recommend SNF at this time to progress pt's strength to assist with bed mobility and transfers.     Follow Up Recommendations SNF    Equipment Recommendations       Recommendations for Other Services       Precautions / Restrictions Precautions Precautions: Fall Restrictions Weight Bearing Restrictions: No      Mobility  Bed Mobility Overal bed mobility: Needs Assistance Bed Mobility: Rolling;Supine to Sit;Sit to Supine Rolling: Max assist   Supine to sit: Max assist;HOB elevated Sit to supine: Max assist   General bed mobility comments: Pt required assistance to advance LE's to the EOB, and then assistance to raise trunk.   Transfers Overall transfer level:  (Not appropriate due to poor static sitting balance. )                  Ambulation/Gait                Stairs            Wheelchair Mobility    Modified Rankin (Stroke Patients Only)       Balance Overall balance assessment: Needs assistance Sitting-balance support: Bilateral upper extremity supported;Feet supported Sitting  balance-Leahy Scale: Poor Sitting balance - Comments: Pt required constant Mod A for static sittin balance due to L lean and anterior lean with noted L sided weakness.  While sitting on the EOB pt was able to perform UE reaching activities with the R UE, but not with the L due to weakness.   Postural control: Left lateral lean                                   Pertinent Vitals/Pain Pain Assessment: Faces Faces Pain Scale: Hurts a little bit Pain Location: Pt states that yes she has pain, but is not able to specify.  Pain Intervention(s): Monitored during session    Home Living Family/patient expects to be discharged to:: Day:  (ALF) Available Help at Discharge: Personal care attendant Type of Home: Assisted living         Home Equipment: Walker - 2 wheels      Prior Function Level of Independence: Needs assistance   Gait / Transfers Assistance Needed: Pt requires at least a walker for ambulation, and transfer level of assist is unknows.   ADL's / Homemaking Assistance Needed: Pt required assistance for all ADL's. -information received from chart review.  No family available.         Hand Dominance        Extremity/Trunk Assessment  Upper Extremity Assessment: Generalized weakness (R UE grossly 2-3/5, and L UE Grossly 2/5)           Lower Extremity Assessment: Generalized weakness         Communication   Communication: HOH;Other (comment) (dementia)  Cognition Arousal/Alertness: Awake/alert Behavior During Therapy: WFL for tasks assessed/performed Overall Cognitive Status: History of cognitive impairments - at baseline                      General Comments      Exercises        Assessment/Plan    PT Assessment Patient needs continued PT services  PT Diagnosis Generalized weakness;Difficulty walking   PT Problem List Decreased strength;Decreased activity tolerance;Decreased range of  motion;Decreased balance;Decreased mobility;Decreased knowledge of use of DME;Decreased safety awareness;Decreased knowledge of precautions;Impaired tone  PT Treatment Interventions Functional mobility training;Therapeutic activities;Therapeutic exercise;DME instruction;Balance training;Patient/family education   PT Goals (Current goals can be found in the Care Plan section) Acute Rehab PT Goals PT Goal Formulation: Patient unable to participate in goal setting Time For Goal Achievement: 10/01/15 Potential to Achieve Goals: Fair    Frequency Min 3X/week   Barriers to discharge        Co-evaluation               End of Session   Activity Tolerance: Patient tolerated treatment well Patient left: in bed;with bed alarm set;with call bell/phone within reach      Functional Assessment Tool Used: Clinical Judgement Functional Limitation: Changing and maintaining body position Changing and Maintaining Body Position Current Status [id number]): At least 80 percent but less than 100 percent impaired, limited or restricted Changing and Maintaining Body Position Goal Status [id number]): At least 60 percent but less than 80 percent impaired, limited or restricted    Time: 1352-1407 PT Time Calculation (min) (ACUTE ONLY): 15 min   Charges:   PT Evaluation $PT Eval Moderate Complexity: 1 Procedure     PT G Codes:   PT G-Codes **NOT FOR INPATIENT CLASS** Functional Assessment Tool Used: Clinical Judgement Functional Limitation: Changing and maintaining body position Changing and Maintaining Body Position Current Status [id number]): At least 80 percent but less than 100 percent impaired, limited or restricted Changing and Maintaining Body Position Goal Status [id number]): At least 60 percent but less than 80 percent impaired, limited or restricted    Tacy Learn, PT, DPT X: [id number]   09/17/2015, 2:27 PM

## 2015-09-17 NOTE — Discharge Summary (Signed)
Physician Discharge Summary  ORINE BADDER [id number] DOB: [birth year] DOA: 09/13/2015  PCP: [name], MD  Admit date: 09/13/2015 Discharge date: 09/17/2015  Time spent: 20 minutes  Patient is DNR  Recommendations for Outpatient Follow-up:  1. Follow up with PCP in 2-3 weeks  2. Continue to encourage PO 3. Would repeat renal panel in 1-2 weeks 4. Patient has hx of bladder outlet obstruction and has foley cath. Please wean foley to off as tolerated at facility. Thanks  Discharge Diagnoses:  Active Problems:   Essential hypertension   COPD (chronic obstructive pulmonary disease) ([place])   Acute encephalopathy   Dementia   Discharge Condition: Stable  Diet recommendation: Dysphagia 3 with thin liquids  There were no vitals filed for this visit.  History of present illness:  Please review dictated H and P from 4/21 for details. Briefly, 82 yof with a hx of urinary retention, PUD, COPD, DM type 2, GERD, HTN, hypothyroidism, and dementia presented with worsening mental status and emesis. Patient has severe dementia at baseline and is normally minimally communicative. She has been receiving abx for a UTI. Additionally, while in the ED, a 3 minute Sz was witnessed by the nursing staff. She was given ativan with resolution of the sz, but remained obtunded throughout ED.  Hospital Course:   Sepsis. Likely from UTI.   Sepsis pathology had resolved.   Lactic acid wnl s/p after adequate hydration.   Antibiotics have been discontinued since blood cultures have shown no growth.  Urine culture showed non specific growth.   Seizure disorder.   Patient was witnessed to have a 3 minute seizure in the ED, likely precipitated by fevers, and was treated with Ativan.   Will continue Ativan PRN.   She has not had any further seizures since coming to the hospital.   Patient was continued on 500mg  bid keppra (increased from 250mg  prior to admit)  Acute  encephalopathy.   Superimposed on base line dementia.   Likely related to presenting sepsis and UTI.   Likely that with advanced dementia and acute illness, that this may be her new baseline.   Acute kidney injury.  Possibly related to dehydration and hypotension.   Improving with IV hydration. Pt has a hx of chronic urinary retention.   Foley cath remains in place, continue Flomax, and monitor urine output.   DM type 2, diet controlled.   Continue SSI.   Pt was having episodes of hypoglycemia and required dextrose infusion. Dextrose has since been discontinued and she seems to be maintaining her blood sugars.   She is also on a diet, today, witnessed to be eating  COPD/ Asthma.   Stable thus far.   Cont albuterol PRN.  GERD.   Continue PPI as tolerated  Hypothyroidism.   TSH wnl.   Continue synthroid as tolerated  Dementia.   Noted to be severe at baseline. .   Initially, per Palliative Care, prognosis is "< 4 weeks, based on poor PO intake, advanced dementia, and families desire to focus on comfort only"  Patient subsequently placed on GIP status, awaiting transfer to hospice when bed available  Ultimately, patient's condition improved and per hospice, no longer hospice or GIP appropriate  Plan to d/c to SNF  Consultations:  Palliative Care  Discharge Exam: Filed Vitals:   09/16/15 1413 09/16/15 2026 09/16/15 2037 09/17/15 0423  BP: 164/74 194/62  137/58  Pulse: 74 68  52  Temp: 97.5 F (36.4 C) 98 F (36.7 C)  97.7 F (36.5 C)  TempSrc: Oral Oral  Oral  Resp: 20 20  20   SpO2: 99% 100% 97% 96%    General: Awake, in nad Cardiovascular: regular, s1, s2 Respiratory: normal resp effort, no wheezing  Discharge Instructions     Medication List    STOP taking these medications        amoxicillin-clavulanate 500-125 MG tablet  Commonly known as:  AUGMENTIN     levETIRAcetam 250 MG tablet  Commonly known as:  KEPPRA   Replaced by:  levETIRAcetam 100 MG/ML solution      TAKE these medications        acetaminophen 325 MG tablet  Commonly known as:  TYLENOL  Take 2 tablets (650 mg total) by mouth every 6 (six) hours as needed for mild pain (or Fever >/= 101).     aspirin 81 MG tablet  Take 81 mg by mouth daily.     chlorhexidine 0.12 % solution  Commonly known as:  PERIDEX  15 mLs by Mouth Rinse route 2 (two) times daily.     chlorpheniramine-HYDROcodone 10-8 MG/5ML Suer  Commonly known as:  TUSSIONEX  Take 2.5 mLs by mouth every 12 (twelve) hours as needed for cough.     CITRACAL PETITES/VITAMIN D 200-250 MG-UNIT Tabs  Generic drug:  Calcium Citrate-Vitamin D  Take 1 tablet by mouth daily.     EARWAX TREATMENT DROPS 6.5 % otic solution  Generic drug:  carbamide peroxide  Place 5 drops into both ears 2 (two) times daily as needed (build up).     glycopyrrolate 1 MG tablet  Commonly known as:  ROBINUL  Take 1 tablet (1 mg total) by mouth every 4 (four) hours as needed (excessive secretions).     haloperidol 0.5 MG tablet  Commonly known as:  HALDOL  Take 1 tablet (0.5 mg total) by mouth every 4 (four) hours as needed for agitation (or delirium).     levETIRAcetam 100 MG/ML solution  Commonly known as:  KEPPRA  Take 5 mLs (500 mg total) by mouth 2 (two) times daily.     levothyroxine 25 MCG tablet  Commonly known as:  SYNTHROID, LEVOTHROID  Take 25 mcg by mouth daily before breakfast.     LORazepam 1 MG tablet  Commonly known as:  ATIVAN  Take 1 tablet (1 mg total) by mouth 3 (three) times daily.     montelukast 10 MG tablet  Commonly known as:  SINGULAIR  Take 10 mg by mouth every morning.     morphine CONCENTRATE 10 MG/0.5ML Soln concentrated solution  Take 0.13 mLs (2.6 mg total) by mouth every 2 (two) hours as needed for severe pain (dyspnea or work of breathing).     polyethylene glycol packet  Commonly known as:  MIRALAX / GLYCOLAX  Take 17 g by mouth daily as needed for  mild constipation.     polyvinyl alcohol 1.4 % ophthalmic solution  Commonly known as:  LIQUIFILM TEARS  Place 1 drop into both eyes 4 (four) times daily as needed for dry eyes.     solifenacin 5 MG tablet  Commonly known as:  VESICARE  Take 5 mg by mouth daily.     tamsulosin 0.4 MG Caps capsule  Commonly known as:  FLOMAX  Take 1 capsule (0.4 mg total) by mouth daily.     theophylline 400 MG 24 hr tablet  Commonly known as:  UNIPHYL  Take 400 mg by mouth daily.  No Known Allergies     Follow-up Information    Follow up with [name], MD. Schedule an appointment as soon as possible for a visit in 2 weeks.   Specialty:  Family Medicine   Why:  Hospital follow up   Contact information:   [address] [place]  [id number] [phone]        The results of significant diagnostics from this hospitalization (including imaging, microbiology, ancillary and laboratory) are listed below for reference.    Significant Diagnostic Studies: Ct Head Wo Contrast  09/07/2015  CLINICAL DATA:  Status post seizure today. Vomiting. No known injury. Subsequent encounter. EXAM: CT HEAD WITHOUT CONTRAST TECHNIQUE: Contiguous axial images were obtained from the base of the skull through the vertex without intravenous contrast. COMPARISON:  Head CT scan 07/11/2015 and 04/26/2015. FINDINGS: Cortical atrophy and chronic microvascular ischemic change are again seen. No evidence of acute intracranial abnormality including hemorrhage, infarct, mass lesion, mass effect, midline shift or abnormal extra-axial fluid collection. No hydrocephalus or pneumocephalus. The calvarium is intact. Imaged paranasal sinuses and mastoid air cells are clear. The patient is status post bilateral lens extraction. IMPRESSION: No acute abnormality. Atrophy and chronic microvascular ischemic change. Electronically Signed   By: Inge Rise M.D.   On: 09/07/2015 14:48   Dg Chest Port 1  View  09/07/2015  CLINICAL DATA:  One 82 year old female with a history of vomiting EXAM: PORTABLE CHEST 1 VIEW COMPARISON:  07/26/2015, 07/11/2015 FINDINGS: Cardiomediastinal silhouette is unchanged. Atherosclerotic calcification of the aortic arch. No evidence of pulmonary vascular congestion. No pneumothorax. Coarsened interstitial markings bilaterally without confluent airspace disease. No large pleural effusion. No displaced fracture. Surgical changes of the cervical region. IMPRESSION: Chronic lung changes without evidence of acute cardiopulmonary disease. Signed, Dulcy Fanny. Earleen Newport, DO [facility] Electronically Signed   By: Corrie Mckusick D.O.   On: 09/07/2015 12:34    Microbiology: Recent Results (from the past 240 hour(s))  MRSA PCR Screening     Status: None   Collection Time: 09/07/15  4:30 PM  Result Value Ref Range Status   MRSA by PCR NEGATIVE NEGATIVE Final    Comment:        The GeneXpert MRSA Assay (FDA approved for NASAL specimens only), is one component of a comprehensive MRSA colonization surveillance program. It is not intended to diagnose MRSA infection nor to guide or monitor treatment for MRSA infections.      Labs: Basic Metabolic Panel: No results for input(s): NA, K, CL, CO2, GLUCOSE, BUN, CREATININE, CALCIUM, MG, PHOS in the last 168 hours. Liver Function Tests: No results for input(s): AST, ALT, ALKPHOS, BILITOT, PROT, ALBUMIN in the last 168 hours. No results for input(s): LIPASE, AMYLASE in the last 168 hours. No results for input(s): AMMONIA in the last 168 hours. CBC: No results for input(s): WBC, NEUTROABS, HGB, HCT, MCV, PLT in the last 168 hours. Cardiac Enzymes: No results for input(s): CKTOTAL, CKMB, CKMBINDEX, TROPONINI in the last 168 hours. BNP: BNP (last 3 results) No results for input(s): BNP in the last 8760 hours.  ProBNP (last 3 results) No results for input(s): PROBNP in  the last 8760 hours.  CBG:  Recent Labs Lab 09/11/15 2345 09/12/15 0338 09/12/15 0708 09/12/15 1102 09/12/15 1610  GLUCAP 121* 104* 99 163* 102*    Signed:  CHIU, STEPHEN K  [facility] 09/17/2015, 12:57 PM

## 2015-09-17 NOTE — Clinical Social Work Note (Signed)
CSW updated [facility]ospice [facility] on pt. No beds at this time. Will continue to follow.  [name], [place]

## 2015-09-17 NOTE — NC FL2 (Signed)
[place] MEDICAID FL2 LEVEL OF CARE SCREENING TOOL     IDENTIFICATION  Patient Name: [name] Birthdate: [birth year] Sex: female Admission Date (Current Location): 09/13/2015  [facility] and [place] Number:  [employer] and Address:  [place] [address] [place]      Provider Number: [id number]  Attending Physician Name and Address:  Donne Hazel, MD  Relative Name and Phone Number:       Current Level of Care: Hospital Recommended Level of Care: [place] Prior Approval Number:    Date Approved/Denied:   PASRR Number: [id number]  Discharge Plan: SNF    Current Diagnoses: Patient Active Problem List   Diagnosis Date Noted  . Palliative care encounter   . Thrombocytopenia ([place]) 09/09/2015  . HCAP (healthcare-associated pneumonia) 07/27/2015  . Elevated troponin   . Pyrexia   . Urinary tract infectious disease   . Fever 07/26/2015  . Sepsis ([place]) 07/26/2015  . Fever of undetermined origin 07/03/2015  . Acute renal failure (ARF) ([place]) 04/25/2015  . Diarrhea 07/26/2014  . Enteritis due to Clostridium difficile 07/26/2014  . Acute kidney injury ([place]) 07/26/2014  . Dementia 07/26/2014  . Nausea with vomiting 07/26/2014  . Dehydration 07/25/2014  . Unspecified cerebral artery occlusion with cerebral infarction 02/08/2014  . Acute encephalopathy 02/06/2014  . Protein-calorie malnutrition, severe ([place]) 02/06/2014  . Urinary retention 02/05/2014  . Cholangitis 02/01/2014  . Cholelithiasis with biliary obstruction 02/01/2014  . Biliary obstruction 02/01/2014  . MGUS (monoclonal gammopathy of unknown significance) 08/28/2013  . PUD (peptic ulcer disease) 08/15/2010  . Diabetes ([place]) 12/28/2009  . Essential hypertension 12/28/2009  . ASTHMA 12/28/2009  . COPD (chronic obstructive pulmonary disease) ([place]) 12/28/2009  . GERD 12/28/2009  . DIVERTICULOSIS, COLON 12/28/2009  . OSTEOARTHRITIS 12/28/2009   . PANCREATITIS, HX OF 12/28/2009    Orientation RESPIRATION BLADDER Height & Weight     Self  Normal Incontinent Weight:   Height:     BEHAVIORAL SYMPTOMS/MOOD NEUROLOGICAL BOWEL NUTRITION STATUS      Incontinent Diet (soft)  AMBULATORY STATUS COMMUNICATION OF NEEDS Skin   Extensive Assist Verbally Bruising, Normal                       Personal Care Assistance Level of Assistance  Bathing, Feeding, Dressing, Total care Bathing Assistance: Maximum assistance Feeding assistance: Maximum assistance Dressing Assistance: Maximum assistance Total Care Assistance: Maximum assistance   Functional Limitations Info  Sight, Hearing, Speech Sight Info: Adequate Hearing Info: Impaired Speech Info: Adequate    SPECIAL CARE FACTORS FREQUENCY                       Contractures Contractures Info: Not present    Additional Factors Info  Code Status, Allergies Code Status Info: DNR Allergies Info: none     Isolation Precautions Info: ESBL, MRSA PRP Positive     Current Medications (09/17/2015):  This is the current hospital active medication list Current Facility-Administered Medications  Medication Dose Route Frequency Provider Last Rate Last Dose  . acetaminophen (TYLENOL) tablet 650 mg  650 mg Oral Q6H PRN Donne Hazel, MD       Or  . acetaminophen (TYLENOL) suppository 650 mg  650 mg Rectal Q6H PRN Donne Hazel, MD      . antiseptic oral rinse (BIOTENE) solution 15 mL  15 mL Topical PRN Donne Hazel, MD      .  chlorhexidine (PERIDEX) 0.12 % solution 15 mL  15 mL Mouth Rinse BID Donne Hazel, MD   15 mL at 09/17/15 1029  . chlorpheniramine-HYDROcodone (TUSSIONEX) 10-8 MG/5ML suspension 2.5 mL  2.5 mL Oral Q12H PRN Donne Hazel, MD      . glycopyrrolate (ROBINUL) tablet 1 mg  1 mg Oral Q4H PRN Donne Hazel, MD       Or  . glycopyrrolate (ROBINUL) injection 0.2 mg  0.2 mg Subcutaneous Q4H PRN Donne Hazel, MD       Or  . glycopyrrolate (ROBINUL)  injection 0.2 mg  0.2 mg Intravenous Q4H PRN Donne Hazel, MD      . haloperidol (HALDOL) tablet 0.5 mg  0.5 mg Oral Q4H PRN Donne Hazel, MD       Or  . haloperidol lactate (HALDOL) injection 0.5 mg  0.5 mg Intravenous Q4H PRN Donne Hazel, MD      . levETIRAcetam (KEPPRA) 100 MG/ML solution 500 mg  500 mg Oral BID Donne Hazel, MD   500 mg at 09/17/15 1029  . LORazepam (ATIVAN) injection 1 mg  1 mg Intravenous Q4H PRN Donne Hazel, MD      . LORazepam (ATIVAN) tablet 1 mg  1 mg Oral TID Donne Hazel, MD   1 mg at 09/17/15 1000  . morphine CONCENTRATE 10 MG/0.5ML oral solution 2.6 mg  2.6 mg Oral Q2H PRN Donne Hazel, MD      . ondansetron (ZOFRAN-ODT) disintegrating tablet 4 mg  4 mg Oral Q6H PRN Donne Hazel, MD       Or  . ondansetron [facility]) injection 4 mg  4 mg Intravenous Q6H PRN Donne Hazel, MD      . polyvinyl alcohol (LIQUIFILM TEARS) 1.4 % ophthalmic solution 1 drop  1 drop Both Eyes QID PRN Donne Hazel, MD      . theophylline 80 MG/15ML elixir 130 mg  130 mg Oral Q8H Donne Hazel, MD   130 mg at 09/16/15 2239     Discharge Medications: Please see discharge summary for a list of discharge medications.  Relevant Imaging Results:  Relevant Lab Results:   Additional Information SSN [id number]  [name], [place]

## 2015-09-17 NOTE — Clinical Social Work Note (Deleted)
CSW updated [facility] on pt. No beds at this time. Will continue to follow.  [name], [place]

## 2015-09-17 NOTE — NC FL2 (Signed)
[place] MEDICAID FL2 LEVEL OF CARE SCREENING TOOL     IDENTIFICATION  Patient Name: [name] Birthdate: [birth year] Sex: female Admission Date (Current Location): 09/13/2015  [facility] and [place] Number:  [employer] and Address:  [place] [address], [place]      Provider Number: [id number]  Attending Physician Name and Address:  Donne Hazel, MD  Relative Name and Phone Number:       Current Level of Care: Hospital Recommended Level of Care: [place] Prior Approval Number:    Date Approved/Denied:   PASRR Number: [id number]  Discharge Plan: SNF    Current Diagnoses: Patient Active Problem List   Diagnosis Date Noted  . Palliative care encounter   . Thrombocytopenia ([place]) 09/09/2015  . HCAP (healthcare-associated pneumonia) 07/27/2015  . Elevated troponin   . Pyrexia   . Urinary tract infectious disease   . Fever 07/26/2015  . Sepsis ([place]) 07/26/2015  . Fever of undetermined origin 07/03/2015  . Acute renal failure (ARF) ([place]) 04/25/2015  . Diarrhea 07/26/2014  . Enteritis due to Clostridium difficile 07/26/2014  . Acute kidney injury ([place]) 07/26/2014  . Dementia 07/26/2014  . Nausea with vomiting 07/26/2014  . Dehydration 07/25/2014  . Unspecified cerebral artery occlusion with cerebral infarction 02/08/2014  . Acute encephalopathy 02/06/2014  . Protein-calorie malnutrition, severe ([place]) 02/06/2014  . Urinary retention 02/05/2014  . Cholangitis 02/01/2014  . Cholelithiasis with biliary obstruction 02/01/2014  . Biliary obstruction 02/01/2014  . MGUS (monoclonal gammopathy of unknown significance) 08/28/2013  . PUD (peptic ulcer disease) 08/15/2010  . Diabetes ([place]) 12/28/2009  . Essential hypertension 12/28/2009  . ASTHMA 12/28/2009  . COPD (chronic obstructive pulmonary disease) ([place]) 12/28/2009  . GERD 12/28/2009  . DIVERTICULOSIS, COLON 12/28/2009  . OSTEOARTHRITIS 12/28/2009   . PANCREATITIS, HX OF 12/28/2009    Orientation RESPIRATION BLADDER Height & Weight     Self  Normal Incontinent Weight:  110 lb Height:     BEHAVIORAL SYMPTOMS/MOOD NEUROLOGICAL BOWEL NUTRITION STATUS      Incontinent Diet (Dysphagia 3 with thin liquids)  AMBULATORY STATUS COMMUNICATION OF NEEDS Skin   Total Care Verbally Bruising, Normal                       Personal Care Assistance Level of Assistance  Bathing, Feeding, Dressing, Total care Bathing Assistance: Maximum assistance Feeding assistance: Maximum assistance Dressing Assistance: Maximum assistance Total Care Assistance: Maximum assistance   Functional Limitations Info  Sight, Hearing, Speech Sight Info: Adequate Hearing Info: Impaired Speech Info: Adequate    SPECIAL CARE FACTORS FREQUENCY  PT (By licensed PT)                    Contractures Contractures Info: Not present    Additional Factors Info  Psychotropic Code Status Info: DNR Allergies Info: none Psychotropic Info: Ativan, Haldol   Isolation Precautions Info: ESBL, MRSA PCR +     Current Medications (09/17/2015):  This is the current hospital active medication list Current Facility-Administered Medications  Medication Dose Route Frequency Provider Last Rate Last Dose  . acetaminophen (TYLENOL) tablet 650 mg  650 mg Oral Q6H PRN Donne Hazel, MD       Or  . acetaminophen (TYLENOL) suppository 650 mg  650 mg Rectal Q6H PRN Donne Hazel, MD      . antiseptic oral rinse (BIOTENE) solution 15 mL  15 mL Topical PRN Donne Hazel,  MD      . chlorhexidine (PERIDEX) 0.12 % solution 15 mL  15 mL Mouth Rinse BID Donne Hazel, MD   15 mL at 09/17/15 1029  . chlorpheniramine-HYDROcodone (TUSSIONEX) 10-8 MG/5ML suspension 2.5 mL  2.5 mL Oral Q12H PRN Donne Hazel, MD      . glycopyrrolate (ROBINUL) tablet 1 mg  1 mg Oral Q4H PRN Donne Hazel, MD       Or  . glycopyrrolate (ROBINUL) injection 0.2 mg  0.2 mg Subcutaneous Q4H PRN Donne Hazel, MD       Or  . glycopyrrolate (ROBINUL) injection 0.2 mg  0.2 mg Intravenous Q4H PRN Donne Hazel, MD      . haloperidol (HALDOL) tablet 0.5 mg  0.5 mg Oral Q4H PRN Donne Hazel, MD       Or  . haloperidol lactate (HALDOL) injection 0.5 mg  0.5 mg Intravenous Q4H PRN Donne Hazel, MD      . levETIRAcetam (KEPPRA) 100 MG/ML solution 500 mg  500 mg Oral BID Donne Hazel, MD   500 mg at 09/17/15 1029  . LORazepam (ATIVAN) injection 1 mg  1 mg Intravenous Q4H PRN Donne Hazel, MD      . LORazepam (ATIVAN) tablet 1 mg  1 mg Oral TID Donne Hazel, MD   1 mg at 09/17/15 1508  . morphine CONCENTRATE 10 MG/0.5ML oral solution 2.6 mg  2.6 mg Oral Q2H PRN Donne Hazel, MD      . ondansetron (ZOFRAN-ODT) disintegrating tablet 4 mg  4 mg Oral Q6H PRN Donne Hazel, MD       Or  . ondansetron [facility]) injection 4 mg  4 mg Intravenous Q6H PRN Donne Hazel, MD      . polyvinyl alcohol (LIQUIFILM TEARS) 1.4 % ophthalmic solution 1 drop  1 drop Both Eyes QID PRN Donne Hazel, MD      . theophylline 80 MG/15ML elixir 130 mg  130 mg Oral Q8H Donne Hazel, MD   130 mg at 09/17/15 1508     Discharge Medications: Please see discharge summary for a list of discharge medications.  Relevant Imaging Results:  Relevant Lab Results:   Additional Information SSN [id number]. DSS guardianNetta Cedars [id number].   [name] [place], [place]

## 2015-09-17 NOTE — Clinical Social Work Placement (Signed)
   CLINICAL SOCIAL WORK PLACEMENT  NOTE  Date:  09/17/2015  Patient Details  Name: [name] MRN: [id number] Date of Birth: [birth year]  Clinical Social Work is seeking post-discharge placement for this patient at the [place] level of care (*CSW will initial, date and re-position this form in  chart as items are completed):  Yes   Patient/family provided with [place] Work Department's list of facilities offering this level of care within the geographic area requested by the patient (or if unable, by the patient's family).  Yes   Patient/family informed of their freedom to choose among providers that offer the needed level of care, that participate in Medicare, Medicaid or managed care program needed by the patient, have an available bed and are willing to accept the patient.  Yes   Patient/family informed of [facility]'s ownership interest in [facility] and [facility], as well as of the fact that they are under no obligation to receive care at these facilities.  PASRR submitted to EDS on 09/17/15     PASRR number received on 09/17/15 (t)     Existing PASRR number confirmed on       FL2 transmitted to all facilities in geographic area requested by pt/family on 09/17/15     FL2 transmitted to all facilities within larger geographic area on 09/17/15     Patient informed that his/her managed care company has contracts with or will negotiate with certain facilities, including the following:        Yes   Patient/family informed of bed offers received.  Patient chooses bed at [facility]     Physician recommends and patient chooses bed at      Patient to be transferred to [facility] on 09/17/15.  Patient to be transferred to facility by [facility]     Patient family notified on 09/17/15 of transfer.  Name of family member notified:  Netta Cedars- DSS guardian     PHYSICIAN       Additional Comment:     _______________________________________________ Salome Arnt, [place] 09/17/2015, 3:38 PM [phone]

## 2015-09-18 DIAGNOSIS — A4189 Other specified sepsis: Secondary | ICD-10-CM | POA: Diagnosis not present

## 2015-09-18 DIAGNOSIS — K5289 Other specified noninfective gastroenteritis and colitis: Secondary | ICD-10-CM | POA: Diagnosis not present

## 2015-09-18 DIAGNOSIS — R5081 Fever presenting with conditions classified elsewhere: Secondary | ICD-10-CM | POA: Diagnosis not present

## 2015-09-18 DIAGNOSIS — J168 Pneumonia due to other specified infectious organisms: Secondary | ICD-10-CM | POA: Diagnosis not present

## 2015-11-06 DIAGNOSIS — R402431 Glasgow coma scale score 3-8, in the field [EMT or ambulance]: Secondary | ICD-10-CM | POA: Diagnosis not present

## 2015-11-06 DIAGNOSIS — R159 Full incontinence of feces: Secondary | ICD-10-CM | POA: Diagnosis not present

## 2015-11-06 DIAGNOSIS — R32 Unspecified urinary incontinence: Secondary | ICD-10-CM | POA: Diagnosis not present

## 2015-11-07 DIAGNOSIS — Z7982 Long term (current) use of aspirin: Secondary | ICD-10-CM | POA: Diagnosis not present

## 2015-11-07 DIAGNOSIS — N390 Urinary tract infection, site not specified: Secondary | ICD-10-CM | POA: Diagnosis not present

## 2015-11-07 DIAGNOSIS — R1311 Dysphagia, oral phase: Secondary | ICD-10-CM | POA: Diagnosis not present

## 2015-11-07 DIAGNOSIS — A419 Sepsis, unspecified organism: Secondary | ICD-10-CM | POA: Diagnosis not present

## 2015-11-07 DIAGNOSIS — E0843 Diabetes mellitus due to underlying condition with diabetic autonomic (poly)neuropathy: Secondary | ICD-10-CM | POA: Diagnosis not present

## 2015-11-07 DIAGNOSIS — E870 Hyperosmolality and hypernatremia: Secondary | ICD-10-CM | POA: Diagnosis not present

## 2015-11-07 DIAGNOSIS — Z780 Asymptomatic menopausal state: Secondary | ICD-10-CM | POA: Diagnosis not present

## 2015-11-07 DIAGNOSIS — R0602 Shortness of breath: Secondary | ICD-10-CM | POA: Diagnosis not present

## 2015-11-07 DIAGNOSIS — R1313 Dysphagia, pharyngeal phase: Secondary | ICD-10-CM | POA: Diagnosis not present

## 2015-11-07 DIAGNOSIS — E440 Moderate protein-calorie malnutrition: Secondary | ICD-10-CM | POA: Diagnosis not present

## 2015-11-07 DIAGNOSIS — E860 Dehydration: Secondary | ICD-10-CM | POA: Diagnosis not present

## 2015-11-07 DIAGNOSIS — B961 Klebsiella pneumoniae [K. pneumoniae] as the cause of diseases classified elsewhere: Secondary | ICD-10-CM | POA: Diagnosis not present

## 2015-11-07 DIAGNOSIS — J690 Pneumonitis due to inhalation of food and vomit: Secondary | ICD-10-CM | POA: Diagnosis not present

## 2015-11-07 DIAGNOSIS — R112 Nausea with vomiting, unspecified: Secondary | ICD-10-CM | POA: Diagnosis not present

## 2015-11-07 DIAGNOSIS — B965 Pseudomonas (aeruginosa) (mallei) (pseudomallei) as the cause of diseases classified elsewhere: Secondary | ICD-10-CM | POA: Diagnosis not present

## 2015-11-07 DIAGNOSIS — G309 Alzheimer's disease, unspecified: Secondary | ICD-10-CM | POA: Diagnosis not present

## 2015-11-07 DIAGNOSIS — A4102 Sepsis due to Methicillin resistant Staphylococcus aureus: Secondary | ICD-10-CM | POA: Diagnosis not present

## 2015-11-07 DIAGNOSIS — E876 Hypokalemia: Secondary | ICD-10-CM | POA: Diagnosis not present

## 2015-11-07 DIAGNOSIS — J449 Chronic obstructive pulmonary disease, unspecified: Secondary | ICD-10-CM | POA: Diagnosis not present

## 2015-11-07 DIAGNOSIS — M545 Low back pain: Secondary | ICD-10-CM | POA: Diagnosis not present

## 2015-11-07 DIAGNOSIS — R339 Retention of urine, unspecified: Secondary | ICD-10-CM | POA: Diagnosis not present

## 2015-11-07 DIAGNOSIS — Z79899 Other long term (current) drug therapy: Secondary | ICD-10-CM | POA: Diagnosis not present

## 2015-11-07 DIAGNOSIS — N3281 Overactive bladder: Secondary | ICD-10-CM | POA: Diagnosis not present

## 2015-11-07 DIAGNOSIS — G8929 Other chronic pain: Secondary | ICD-10-CM | POA: Diagnosis not present

## 2015-11-07 DIAGNOSIS — J45909 Unspecified asthma, uncomplicated: Secondary | ICD-10-CM | POA: Diagnosis not present

## 2015-11-07 DIAGNOSIS — E039 Hypothyroidism, unspecified: Secondary | ICD-10-CM | POA: Diagnosis not present

## 2015-11-07 DIAGNOSIS — Z7401 Bed confinement status: Secondary | ICD-10-CM | POA: Diagnosis not present

## 2015-11-07 DIAGNOSIS — K219 Gastro-esophageal reflux disease without esophagitis: Secondary | ICD-10-CM | POA: Diagnosis not present

## 2015-11-07 DIAGNOSIS — E119 Type 2 diabetes mellitus without complications: Secondary | ICD-10-CM | POA: Diagnosis not present

## 2015-11-07 DIAGNOSIS — I10 Essential (primary) hypertension: Secondary | ICD-10-CM | POA: Diagnosis not present

## 2015-11-07 DIAGNOSIS — R279 Unspecified lack of coordination: Secondary | ICD-10-CM | POA: Diagnosis not present

## 2015-11-07 DIAGNOSIS — R4182 Altered mental status, unspecified: Secondary | ICD-10-CM | POA: Diagnosis not present

## 2015-11-12 DIAGNOSIS — E870 Hyperosmolality and hypernatremia: Secondary | ICD-10-CM | POA: Diagnosis not present

## 2015-11-12 DIAGNOSIS — R339 Retention of urine, unspecified: Secondary | ICD-10-CM | POA: Diagnosis not present

## 2015-11-12 DIAGNOSIS — I10 Essential (primary) hypertension: Secondary | ICD-10-CM | POA: Diagnosis not present

## 2015-11-12 DIAGNOSIS — R112 Nausea with vomiting, unspecified: Secondary | ICD-10-CM | POA: Diagnosis not present

## 2015-11-12 DIAGNOSIS — E0843 Diabetes mellitus due to underlying condition with diabetic autonomic (poly)neuropathy: Secondary | ICD-10-CM | POA: Diagnosis not present

## 2015-11-12 DIAGNOSIS — E440 Moderate protein-calorie malnutrition: Secondary | ICD-10-CM | POA: Diagnosis not present

## 2015-11-12 DIAGNOSIS — A4102 Sepsis due to Methicillin resistant Staphylococcus aureus: Secondary | ICD-10-CM | POA: Diagnosis not present

## 2015-11-12 DIAGNOSIS — R1311 Dysphagia, oral phase: Secondary | ICD-10-CM | POA: Diagnosis not present

## 2015-11-12 DIAGNOSIS — K219 Gastro-esophageal reflux disease without esophagitis: Secondary | ICD-10-CM | POA: Diagnosis not present

## 2015-11-12 DIAGNOSIS — E119 Type 2 diabetes mellitus without complications: Secondary | ICD-10-CM | POA: Diagnosis not present

## 2015-11-12 DIAGNOSIS — J690 Pneumonitis due to inhalation of food and vomit: Secondary | ICD-10-CM | POA: Diagnosis not present

## 2015-11-12 DIAGNOSIS — R279 Unspecified lack of coordination: Secondary | ICD-10-CM | POA: Diagnosis not present

## 2015-11-12 DIAGNOSIS — E039 Hypothyroidism, unspecified: Secondary | ICD-10-CM | POA: Diagnosis not present

## 2015-11-12 DIAGNOSIS — J449 Chronic obstructive pulmonary disease, unspecified: Secondary | ICD-10-CM | POA: Diagnosis not present

## 2015-11-12 DIAGNOSIS — R1313 Dysphagia, pharyngeal phase: Secondary | ICD-10-CM | POA: Diagnosis not present

## 2015-11-12 DIAGNOSIS — E860 Dehydration: Secondary | ICD-10-CM | POA: Diagnosis not present

## 2015-11-12 DIAGNOSIS — N3281 Overactive bladder: Secondary | ICD-10-CM | POA: Diagnosis not present

## 2015-11-12 DIAGNOSIS — Z7401 Bed confinement status: Secondary | ICD-10-CM | POA: Diagnosis not present

## 2016-03-09 IMAGING — DX DG CHEST 1V
1 series · 1 of 1 positions shown · non-contrast
Comparison: 04/25/2015

CLINICAL DATA: Weakness.  COPD .

EXAM:
CHEST 1 VIEW

[chest ap]
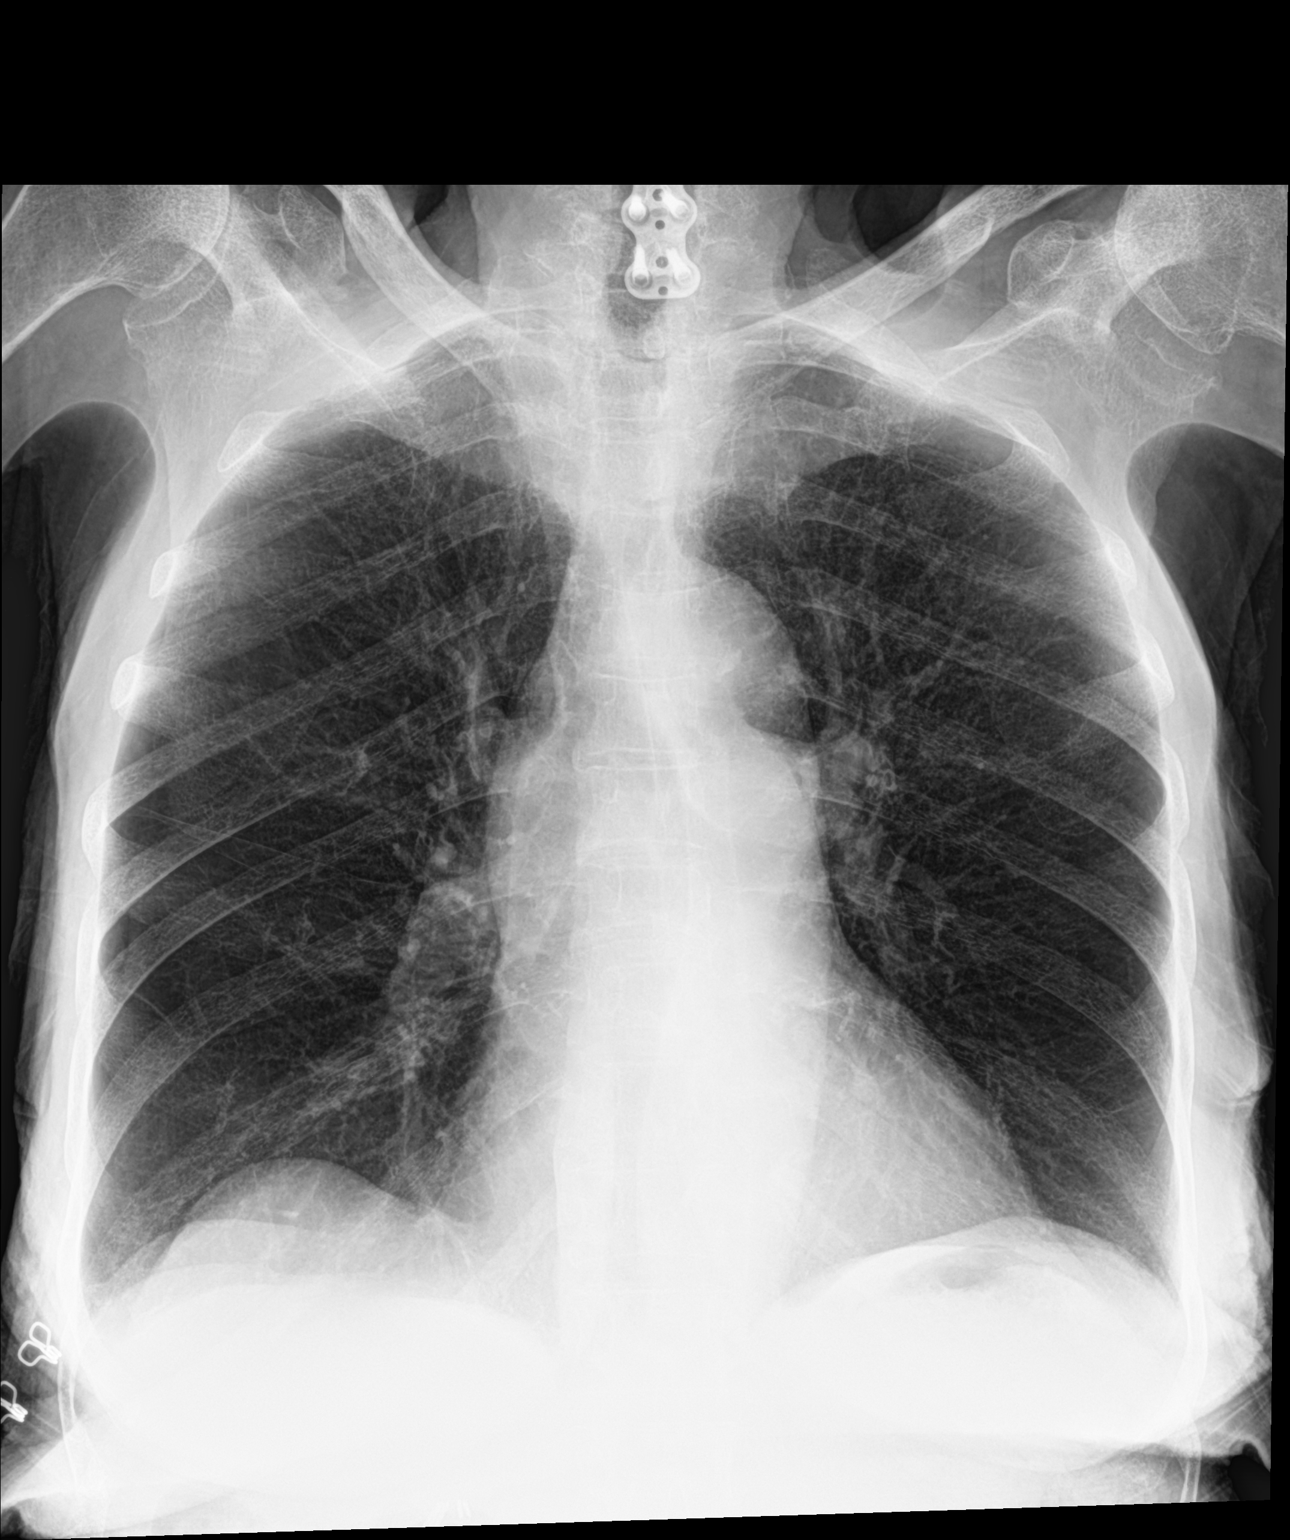

[1 of 1 positions shown; findings below may reference images not displayed]

FINDINGS: Mediastinum hilar structures normal. Low lung volumes. No pleural
effusion or pneumothorax. Stable cardiomegaly. No pulmonary venous
congestion. Prior cervical spine fusion .
IMPRESSION: No acute cardiopulmonary disease.  Stable cardiomegaly.

## 2016-05-05 IMAGING — CT CT ABD-PELV W/O CM
2 of 8 series · 12 of 46 positions shown, 18 images · non-contrast
Comparison: 06/09/2014

CLINICAL DATA: Nausea, vomiting, and epigastric pain

EXAM:
CT ABDOMEN AND PELVIS WITHOUT CONTRAST
TECHNIQUE: Multidetector CT imaging of the abdomen and pelvis was performed
following the standard protocol without IV contrast.

[Series 2: abdomen/pelvis w/o contrast · axial · non-contrast · 0.66mm/px · z∈[+359,+679]mm · 9 of 82 slices shown, 15 images]
[im 9/82  soft-tissue]
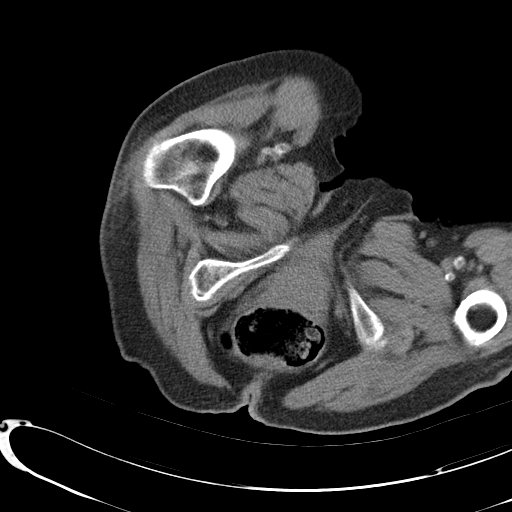
[im 9/82  bone]
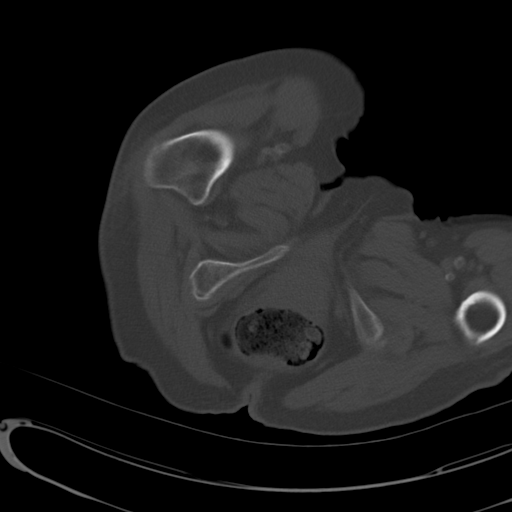
[im 17/82  soft-tissue]
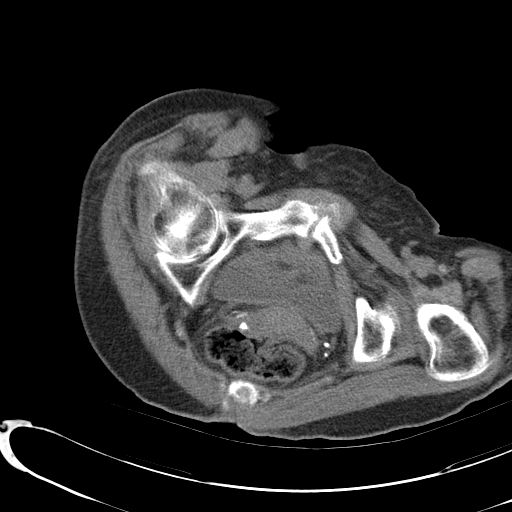
[im 25/82  soft-tissue]
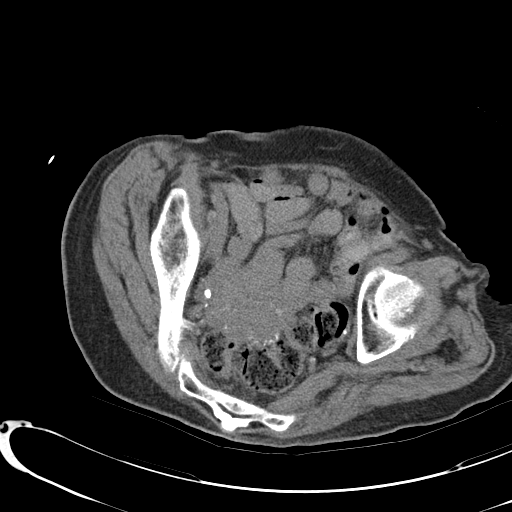
[im 33/82  soft-tissue]
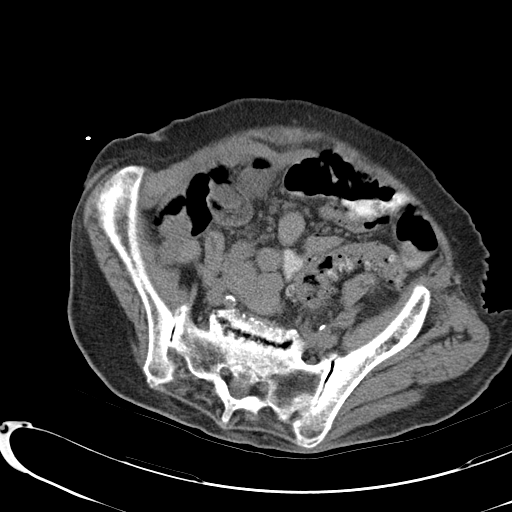
[im 41/82  soft-tissue]
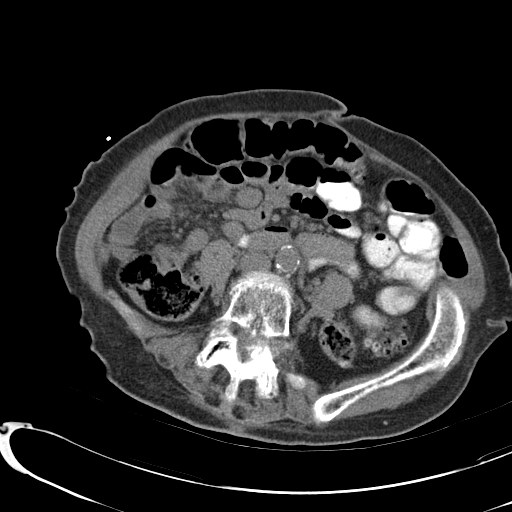
[im 49/82  soft-tissue]
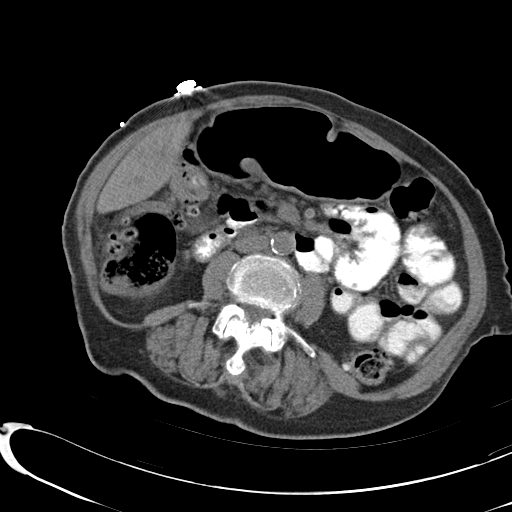
[im 49/82  lung]
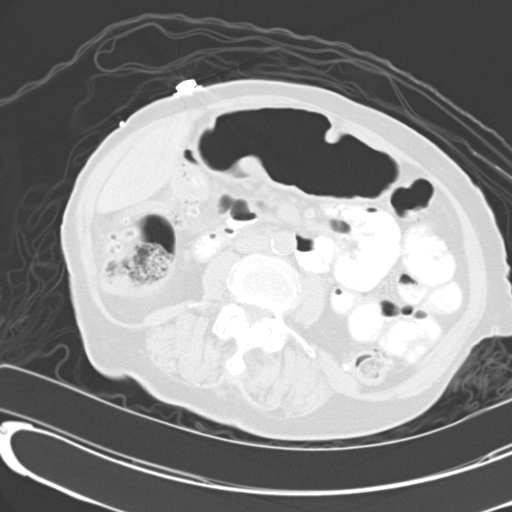
[im 57/82  soft-tissue]
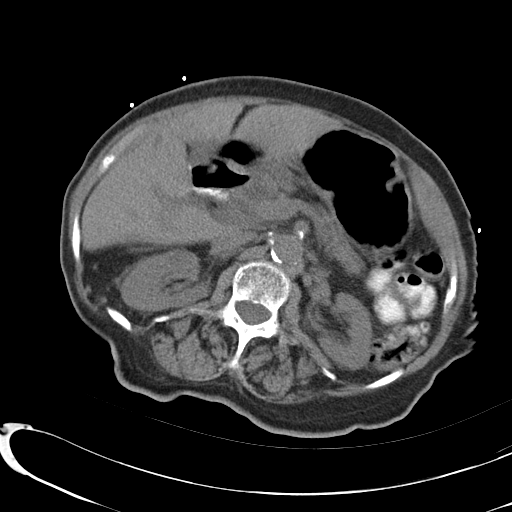
[im 57/82  lung]
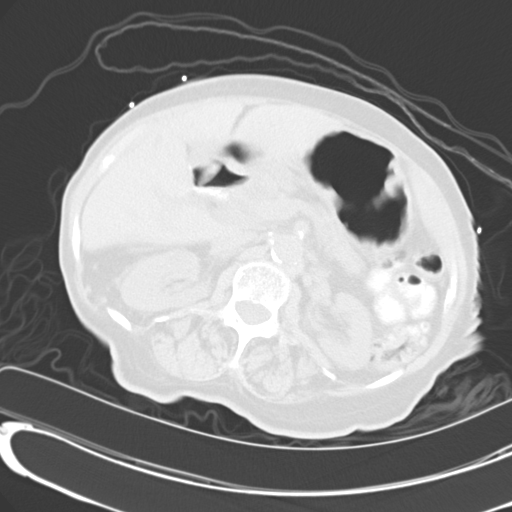
[im 65/82  soft-tissue]
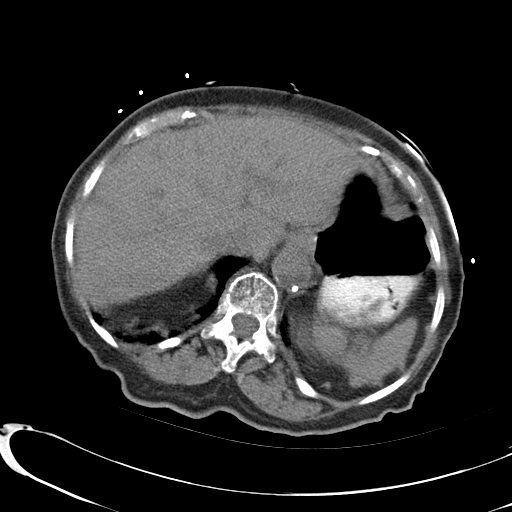
[im 65/82  lung]
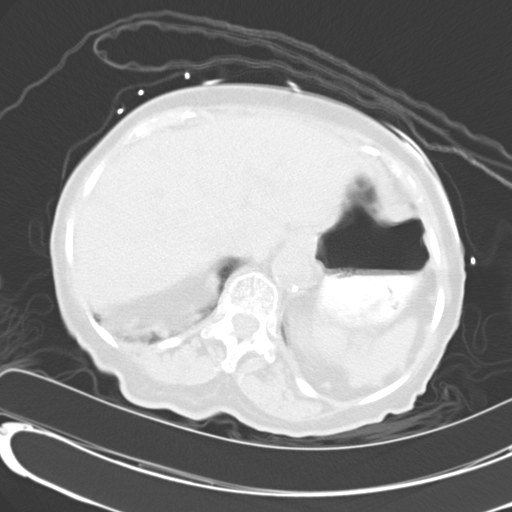
[im 73/82  soft-tissue]
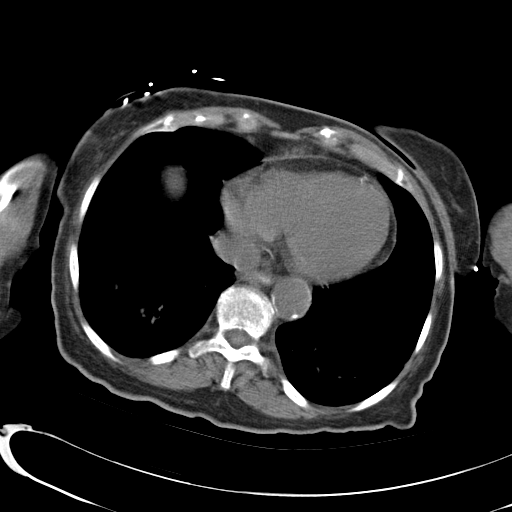
[im 73/82  lung]
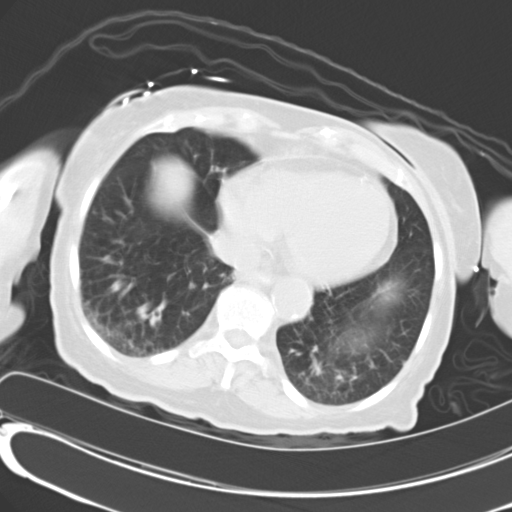
[im 73/82  bone]
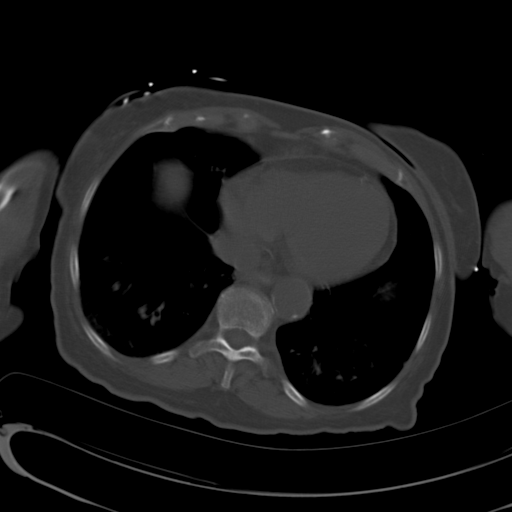

[Series 3: mpr cor (id) · coronal · 0.67mm/px · 3 of 76 slices shown]
[im 19/76  soft-tissue]
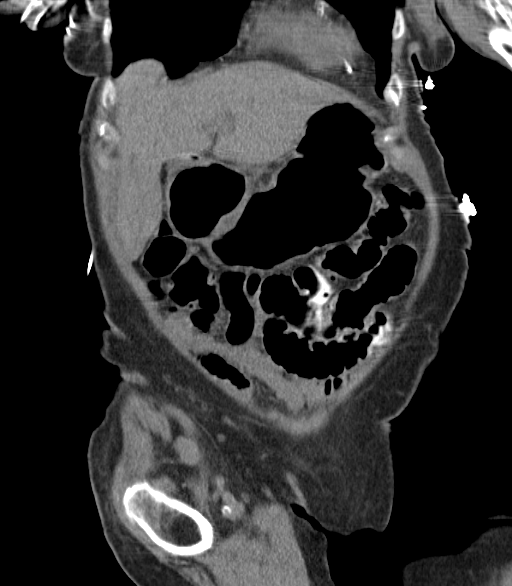
[im 38/76  soft-tissue]
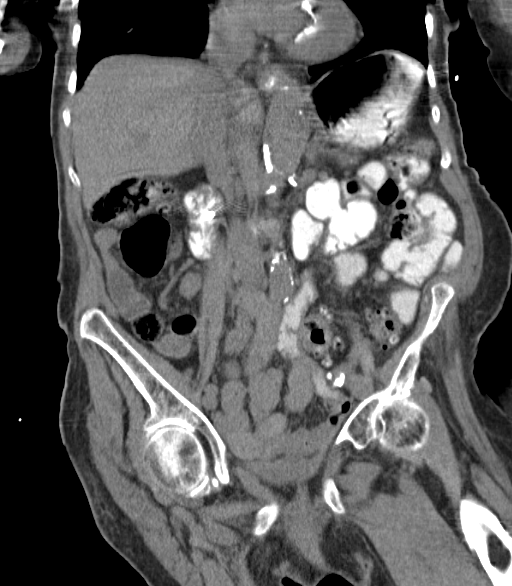
[im 57/76  soft-tissue]
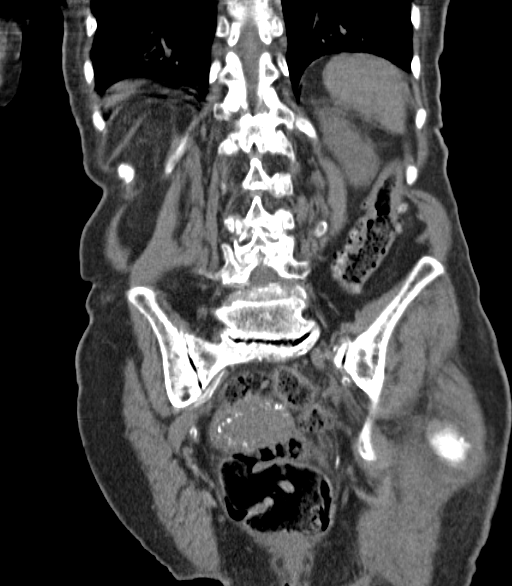

[12 of 46 positions shown; findings below may reference images not displayed]

FINDINGS: Lower chest and abdominal wall: Airway thickening and occasional
mucoid impaction in the lower lobes. Accounting for atelectasis, no
pneumonia.

Hepatobiliary: No focal liver abnormality.Cholecystectomy. Chronic
common bile duct dilatation with indwelling stent from the lower
common bile duct to the duodenum. No neighboring calcified
choledocholithiasis. Similar CBD dilatation at 11 mm.

Pancreas: Generalized atrophy.

Spleen: Unremarkable.

Adrenals/Urinary Tract: Negative adrenals. No hydronephrosis or
stone. 14 mm cyst in the interpolar right kidney. Unremarkable
bladder.

Reproductive:Unremarkable for age.

Stomach/Bowel: No obstruction or inflammatory wall thickening.
Extensive colonic and small bowel diverticulosis. No active
inflammation. No appendicitis.

Vascular/Lymphatic: Extensive atherosclerotic calcification. No
evidence of acute vascular disease. No mass or adenopathy.

Peritoneal: No ascites or pneumoperitoneum.

Musculoskeletal: No acute finding. Advanced lumbar facet arthropathy
with multilevel listhesis and advanced disc degeneration.
IMPRESSION: 1. No acute finding or change since 4367. No explanation for
symptoms.
2. Chronic findings are described above.

## 2016-05-19 DEATH — deceased
